# Patient Record
Sex: Male | Born: 1960
Health system: Southern US, Community
[De-identification: ages and names within clinical notes are randomized; demographics above are authoritative.]

## PROBLEM LIST (undated history)

## (undated) DIAGNOSIS — N400 Enlarged prostate without lower urinary tract symptoms: Secondary | ICD-10-CM

## (undated) DIAGNOSIS — S0081XA Abrasion of other part of head, initial encounter: Secondary | ICD-10-CM

## (undated) DIAGNOSIS — F32A Depression, unspecified: Secondary | ICD-10-CM

## (undated) DIAGNOSIS — F329 Major depressive disorder, single episode, unspecified: Secondary | ICD-10-CM

## (undated) DIAGNOSIS — S0230XA Fracture of orbital floor, unspecified side, initial encounter for closed fracture: Secondary | ICD-10-CM

## (undated) DIAGNOSIS — I10 Essential (primary) hypertension: Secondary | ICD-10-CM

## (undated) DIAGNOSIS — F419 Anxiety disorder, unspecified: Secondary | ICD-10-CM

## (undated) DIAGNOSIS — M069 Rheumatoid arthritis, unspecified: Secondary | ICD-10-CM

## (undated) DIAGNOSIS — F4310 Post-traumatic stress disorder, unspecified: Secondary | ICD-10-CM

## (undated) DIAGNOSIS — U071 COVID-19: Secondary | ICD-10-CM

## (undated) HISTORY — DX: Essential (primary) hypertension: I10

## (undated) HISTORY — PX: KNEE ARTHROSCOPY: SHX127

---

## 2003-08-11 ENCOUNTER — Emergency Department (HOSPITAL_COMMUNITY): Admission: EM | Admit: 2003-08-11 | Discharge: 2003-08-11 | Payer: Self-pay | Admitting: Emergency Medicine

## 2012-01-24 ENCOUNTER — Other Ambulatory Visit (HOSPITAL_COMMUNITY): Payer: Self-pay | Admitting: Family Medicine

## 2012-01-24 ENCOUNTER — Ambulatory Visit (HOSPITAL_COMMUNITY)
Admission: RE | Admit: 2012-01-24 | Discharge: 2012-01-24 | Disposition: A | Discharge status: Home / Self Care | Payer: Self-pay | Source: Ambulatory Visit | Attending: Family Medicine | Admitting: Family Medicine

## 2012-01-24 DIAGNOSIS — R52 Pain, unspecified: Secondary | ICD-10-CM | POA: Insufficient documentation

## 2012-01-27 ENCOUNTER — Emergency Department (HOSPITAL_COMMUNITY)
Admission: EM | Admit: 2012-01-27 | Discharge: 2012-01-27 | Disposition: A | Discharge status: Home / Self Care | Payer: Self-pay | Attending: Emergency Medicine | Admitting: Emergency Medicine

## 2012-01-27 ENCOUNTER — Encounter (HOSPITAL_COMMUNITY): Payer: Self-pay

## 2012-01-27 DIAGNOSIS — J3489 Other specified disorders of nose and nasal sinuses: Secondary | ICD-10-CM | POA: Insufficient documentation

## 2012-01-27 DIAGNOSIS — R040 Epistaxis: Secondary | ICD-10-CM | POA: Insufficient documentation

## 2012-01-27 LAB — COMPREHENSIVE METABOLIC PANEL
ALT: 16 U/L (ref 0–53)
AST: 29 U/L (ref 0–37)
Albumin: 3.9 g/dL (ref 3.5–5.2)
Alkaline Phosphatase: 87 U/L (ref 39–117)
Potassium: 3.8 mEq/L (ref 3.5–5.1)
Sodium: 137 mEq/L (ref 135–145)
Total Protein: 7.6 g/dL (ref 6.0–8.3)

## 2012-01-27 LAB — CBC
MCH: 30.6 pg (ref 26.0–34.0)
MCHC: 34.1 g/dL (ref 30.0–36.0)
Platelets: 258 10*3/uL (ref 150–400)
RBC: 4.08 MIL/uL — ABNORMAL LOW (ref 4.22–5.81)

## 2012-01-27 LAB — DIFFERENTIAL
Basophils Relative: 0 % (ref 0–1)
Eosinophils Absolute: 0.1 10*3/uL (ref 0.0–0.7)
Lymphs Abs: 3 10*3/uL (ref 0.7–4.0)
Neutrophils Relative %: 36 % — ABNORMAL LOW (ref 43–77)

## 2012-01-27 MED ORDER — OXYMETAZOLINE HCL 0.05 % NA SOLN
1.0000 | Freq: Once | NASAL | Status: AC
  Administered 2012-01-27: 1 via NASAL
  Filled 2012-01-27: qty 15

## 2012-01-27 NOTE — ED Notes (Signed)
EMS states nose bleed starting at approx 1800 today. Pt advises DX with HTN today. Has not started medication yet.

## 2012-01-27 NOTE — ED Notes (Signed)
Pt given sandwich and juice

## 2012-01-27 NOTE — ED Notes (Signed)
Pt tearful when I entered the room. Would not say what was bothering him. Pt also requesting food if able. Tim, RN aware.

## 2012-01-27 NOTE — ED Provider Notes (Signed)
Complains of nosebleed from both nares onset this afternoon lasted one hour resolved while here on exam alert no distress. Dried blood at both nares. No suspected with nasal speculum and headlamp no bleeding site visualized  Doug Sou, MD 01/27/12 2241

## 2012-01-27 NOTE — ED Notes (Signed)
[id number]> Expected date:<BR> Expected time:<BR> Means of arrival:<BR> Comments:<BR> EMS/HTN/nosebleed

## 2012-01-27 NOTE — ED Provider Notes (Signed)
History     CSN: [id number]  Arrival date & time 01/27/12  1308   First MD Initiated Contact with Patient 01/27/12 2025      Chief Complaint  Patient presents with  . Epistaxis    (Consider location/radiation/quality/duration/timing/severity/associated sxs/prior treatment) HPI Comments: Patient states he was walking and he had bilateral nosebleed that led for approximately 2 hours.  He tried stopping the bleeding by inserting tissue into his naris.  He did not apply external pressure.  He called EMS.  There is no active bleeding at this time.  He does not have a history ofis not on an anticoagulant today.  He was at a doctor's office for chronic leg pain and was told with hypertensive and prescribed an antihypertensive, which he has not filled as of yet right now in the emergency department.  He is not hypertensive, with blood pressure of 118.  He does not have headache.  He does report that he has had "flulike symptoms" for several days with nasal congestion and lots of mucus  Patient is a 51 y.o. male presenting with nosebleeds.  Epistaxis  This is a new problem. The current episode started 1 to 2 hours ago. The problem has been resolved. The bleeding has been from both nares. He has tried nothing for the symptoms. His past medical history is significant for colds and sinus problems. His past medical history does not include bleeding disorder, nose-picking or frequent nosebleeds.    Past Medical History  Diagnosis Date  . Hypertension     History reviewed. No pertinent past surgical history.  History reviewed. No pertinent family history.  History  Substance Use Topics  . Smoking status: Not on file  . Smokeless tobacco: Not on file  . Alcohol Use:       Review of Systems  Constitutional: Negative for fever and chills.  HENT: Positive for nosebleeds, congestion and rhinorrhea. Negative for sore throat and trouble swallowing.   Respiratory: Negative for cough.     Cardiovascular: Negative for chest pain.  Gastrointestinal: Negative for nausea and vomiting.  Neurological: Negative for dizziness and weakness.    Allergies  Review of patient's allergies indicates no known allergies.  Home Medications   Current Outpatient Rx  Name Route Sig Dispense Refill  . AMOXICILLIN 500 MG PO CAPS Oral Take 500 mg by mouth 3 (three) times daily. 10 day course of therapy; completed.    Marland Kitchen DICLOFENAC SODIUM 75 MG PO TBEC Oral Take 75 mg by mouth 2 (two) times daily.      BP 118/73  Pulse 91  Temp(Src) 98.3 F (36.8 C) (Oral)  Resp 20  SpO2 98%  Physical Exam  Constitutional: He is oriented to person, place, and time. He appears well-developed and well-nourished.  HENT:  Head: Normocephalic.  Nose: Rhinorrhea present. No nose lacerations, sinus tenderness, nasal deformity, septal deviation or nasal septal hematoma.       Boggy mucosa bilaterally evidence of previous epistaxis.  No active bleeding at this time  Eyes: Pupils are equal, round, and reactive to light.  Neck: Normal range of motion.  Cardiovascular: Normal rate.   Pulmonary/Chest: Effort normal. He has no wheezes.  Musculoskeletal: Normal range of motion.  Neurological: He is alert and oriented to person, place, and time.  Skin: Skin is warm and dry.    ED Course  Procedures (including critical care time)  Labs Reviewed  CBC - Abnormal; Notable for the following:    RBC 4.08 (*)  Hemoglobin 12.5 (*)    HCT 36.7 (*)    All other components within normal limits  DIFFERENTIAL - Abnormal; Notable for the following:    Neutrophils Relative 36 (*)    Lymphocytes Relative 55 (*)    All other components within normal limits  COMPREHENSIVE METABOLIC PANEL  PROTIME-INR   No results found.   1. Epistaxis     No recurrent nasal bleeding  MDM  Will check CBC CMet, and INR of as the nurse to administer Afrin bilaterally were observed.  Patient at this time.  His blood pressure is  normal.  We'll continue to monitor this throughout his ED visit        Arman Filter, NP 01/27/12 2245

## 2012-01-28 NOTE — ED Provider Notes (Signed)
Medical screening examination/treatment/procedure(s) were conducted as a shared visit with non-physician practitioner(s) and myself.  I personally evaluated the patient during the encounter  Doug Sou, MD 01/28/12 [phone]

## 2012-02-04 ENCOUNTER — Emergency Department (INDEPENDENT_AMBULATORY_CARE_PROVIDER_SITE_OTHER)
Admission: EM | Admit: 2012-02-04 | Discharge: 2012-02-04 | Disposition: A | Discharge status: Home / Self Care | Payer: Self-pay | Source: Home / Self Care | Attending: Emergency Medicine | Admitting: Emergency Medicine

## 2012-02-04 ENCOUNTER — Encounter (HOSPITAL_COMMUNITY): Payer: Self-pay

## 2012-02-04 DIAGNOSIS — J329 Chronic sinusitis, unspecified: Secondary | ICD-10-CM

## 2012-02-04 MED ORDER — GUAIFENESIN ER 600 MG PO TB12: 1200.0000 mg | ORAL_TABLET | Freq: Two times a day (BID) | ORAL | Status: DC

## 2012-02-04 MED ORDER — DOXYCYCLINE HYCLATE 100 MG PO CAPS: 100.0000 mg | ORAL_CAPSULE | Freq: Two times a day (BID) | ORAL | Status: AC

## 2012-02-04 MED ORDER — FLUTICASONE PROPIONATE 50 MCG/ACT NA SUSP: 2.0000 | Freq: Every day | NASAL | Status: DC

## 2012-02-04 NOTE — ED Provider Notes (Signed)
History     CSN: [id number]  Arrival date & time 02/04/12  1531   First MD Initiated Contact with Patient 02/04/12 1721      Chief Complaint  Patient presents with  . Influenza    (Consider location/radiation/quality/duration/timing/severity/associated sxs/prior treatment) HPI Comments: Pt with nasal congestion, postnasal drip, ST, nonproductive cough, bilateral frontal sinus pain/pressure x 2  Weeks. Complains of purulent nasal discharge, bodyaches, malaise. No fevers, N/V, other HA, dental pain, neck pain, wheezing, chest pain, shortness breath. Taking otc cold meds w/o relief.    Patient is a 51 y.o. male presenting with flu symptoms. The history is provided by the patient. No language interpreter was used.  Influenza The current episode started more than 1 week ago. The problem occurs constantly. The problem has been gradually worsening. Associated symptoms include headaches. Pertinent negatives include no chest pain and no shortness of breath. The symptoms are relieved by nothing. Treatments tried: zicam. The treatment provided no relief.    Past Medical History  Diagnosis Date  . Hypertension   . Arthritis     History reviewed. No pertinent past surgical history.  History reviewed. No pertinent family history.  History  Substance Use Topics  . Smoking status: Never Smoker   . Smokeless tobacco: Not on file  . Alcohol Use: No      Review of Systems  Constitutional: Positive for fatigue. Negative for fever and chills.  HENT: Positive for congestion, sore throat, rhinorrhea, postnasal drip and sinus pressure. Negative for hearing loss, ear pain, nosebleeds, facial swelling and neck pain.   Respiratory: Positive for cough. Negative for shortness of breath and wheezing.   Cardiovascular: Negative for chest pain.  Gastrointestinal: Negative for nausea and vomiting.  Neurological: Positive for headaches.    Allergies  Review of patient's allergies indicates no known  allergies.  Home Medications   Current Outpatient Rx  Name Route Sig Dispense Refill  . HYDROCHLOROTHIAZIDE 25 MG PO TABS Oral Take 25 mg by mouth daily.    Marland Kitchen PREDNISONE (PAK) 10 MG PO TABS Oral Take 10 mg by mouth daily.    . TRAMADOL HCL 50 MG PO TABS Oral Take 50 mg by mouth every 6 (six) hours as needed.    Marland Kitchen DICLOFENAC SODIUM 75 MG PO TBEC Oral Take 75 mg by mouth 2 (two) times daily.    Marland Kitchen DOXYCYCLINE HYCLATE 100 MG PO CAPS Oral Take 1 capsule (100 mg total) by mouth 2 (two) times daily. X 7 days 14 capsule 0  . FLUTICASONE PROPIONATE 50 MCG/ACT NA SUSP Nasal Place 2 sprays into the nose daily. 16 g 0  . GUAIFENESIN ER 600 MG PO TB12 Oral Take 2 tablets (1,200 mg total) by mouth 2 (two) times daily. 28 tablet 0    BP 131/86  Pulse 90  Temp(Src) 98.2 F (36.8 C) (Oral)  Resp 22  SpO2 97%  Physical Exam  Nursing note and vitals reviewed. Constitutional: He is oriented to person, place, and time. He appears well-developed and well-nourished.  HENT:  Head: Normocephalic and atraumatic.  Nose: Mucosal edema and rhinorrhea present. No epistaxis. Right sinus exhibits maxillary sinus tenderness and frontal sinus tenderness. Left sinus exhibits maxillary sinus tenderness and frontal sinus tenderness.  Mouth/Throat: Uvula is midline and mucous membranes are normal. Posterior oropharyngeal erythema present. No oropharyngeal exudate.       Purulent nasal discharge. Bilateral cerumen impaction  Eyes: Conjunctivae and EOM are normal. Pupils are equal, round, and reactive to light.  Neck:  Normal range of motion. Neck supple.  Cardiovascular: Normal rate, regular rhythm and normal heart sounds.   Pulmonary/Chest: Effort normal and breath sounds normal. No respiratory distress.  Abdominal: He exhibits no distension.  Musculoskeletal: Normal range of motion. He exhibits no edema and no tenderness.  Lymphadenopathy:    He has no cervical adenopathy.  Neurological: He is alert and oriented to  person, place, and time.  Skin: Skin is warm and dry. No rash noted.  Psychiatric: He has a normal mood and affect. His behavior is normal. Judgment and thought content normal.    ED Course  Procedures (including critical care time)  Labs Reviewed - No data to display No results found.   1. Sinusitis     MDM  Previous records reviewed. Patient seen on 01/27/2012 for epistaxis.   Pt with indications for abx. Will start  doxycycline in addition to flonase, mucinex, saline nasal irrigation, increase fluids, tylenol prn pain. Discussed MDM and plan with pt. Pt agrees with plan and will f/u with PMD prn.    Luiz Blare, MD 02/04/12 [phone]

## 2012-02-04 NOTE — ED Notes (Signed)
C/o cough,  Nasal congestion w thick yellow  nasal secretions, minimal relief w OTC medications

## 2012-02-28 ENCOUNTER — Ambulatory Visit (HOSPITAL_BASED_OUTPATIENT_CLINIC_OR_DEPARTMENT_OTHER): Payer: Self-pay

## 2012-03-02 ENCOUNTER — Ambulatory Visit (HOSPITAL_BASED_OUTPATIENT_CLINIC_OR_DEPARTMENT_OTHER): Payer: Self-pay | Attending: Family Medicine

## 2012-03-12 ENCOUNTER — Encounter (HOSPITAL_BASED_OUTPATIENT_CLINIC_OR_DEPARTMENT_OTHER): Payer: Self-pay

## 2012-04-11 ENCOUNTER — Ambulatory Visit: Payer: Self-pay | Admitting: Physical Therapy

## 2012-04-19 ENCOUNTER — Ambulatory Visit: Payer: Self-pay | Admitting: Rehabilitative and Restorative Service Providers"

## 2012-05-04 ENCOUNTER — Ambulatory Visit: Payer: Self-pay | Attending: Family Medicine | Admitting: Rehabilitation

## 2012-05-04 DIAGNOSIS — R262 Difficulty in walking, not elsewhere classified: Secondary | ICD-10-CM | POA: Insufficient documentation

## 2012-05-04 DIAGNOSIS — M25569 Pain in unspecified knee: Secondary | ICD-10-CM | POA: Insufficient documentation

## 2012-05-04 DIAGNOSIS — IMO0001 Reserved for inherently not codable concepts without codable children: Secondary | ICD-10-CM | POA: Insufficient documentation

## 2012-05-04 DIAGNOSIS — M25669 Stiffness of unspecified knee, not elsewhere classified: Secondary | ICD-10-CM | POA: Insufficient documentation

## 2012-05-09 ENCOUNTER — Emergency Department (HOSPITAL_COMMUNITY)
Admission: EM | Admit: 2012-05-09 | Discharge: 2012-05-09 | Disposition: A | Discharge status: Home / Self Care | Payer: Self-pay | Attending: Emergency Medicine | Admitting: Emergency Medicine

## 2012-05-09 ENCOUNTER — Encounter (HOSPITAL_COMMUNITY): Payer: Self-pay | Admitting: *Deleted

## 2012-05-09 DIAGNOSIS — R109 Unspecified abdominal pain: Secondary | ICD-10-CM | POA: Insufficient documentation

## 2012-05-09 DIAGNOSIS — R112 Nausea with vomiting, unspecified: Secondary | ICD-10-CM | POA: Insufficient documentation

## 2012-05-09 DIAGNOSIS — M129 Arthropathy, unspecified: Secondary | ICD-10-CM | POA: Insufficient documentation

## 2012-05-09 DIAGNOSIS — Z79899 Other long term (current) drug therapy: Secondary | ICD-10-CM | POA: Insufficient documentation

## 2012-05-09 DIAGNOSIS — R197 Diarrhea, unspecified: Secondary | ICD-10-CM | POA: Insufficient documentation

## 2012-05-09 DIAGNOSIS — I10 Essential (primary) hypertension: Secondary | ICD-10-CM | POA: Insufficient documentation

## 2012-05-09 DIAGNOSIS — A088 Other specified intestinal infections: Secondary | ICD-10-CM | POA: Insufficient documentation

## 2012-05-09 DIAGNOSIS — A084 Viral intestinal infection, unspecified: Secondary | ICD-10-CM

## 2012-05-09 LAB — CBC
HCT: 42.2 % (ref 39.0–52.0)
MCV: 93 fL (ref 78.0–100.0)
Platelets: 214 10*3/uL (ref 150–400)
RBC: 4.54 MIL/uL (ref 4.22–5.81)
WBC: 6.1 10*3/uL (ref 4.0–10.5)

## 2012-05-09 LAB — DIFFERENTIAL
Eosinophils Relative: 0 % (ref 0–5)
Lymphocytes Relative: 7 % — ABNORMAL LOW (ref 12–46)
Lymphs Abs: 0.4 10*3/uL — ABNORMAL LOW (ref 0.7–4.0)
Neutro Abs: 5.5 10*3/uL (ref 1.7–7.7)

## 2012-05-09 LAB — URINALYSIS, ROUTINE W REFLEX MICROSCOPIC
Glucose, UA: NEGATIVE mg/dL
Specific Gravity, Urine: 1.03 (ref 1.005–1.030)
Urobilinogen, UA: 0.2 mg/dL (ref 0.0–1.0)

## 2012-05-09 LAB — HEPATIC FUNCTION PANEL
ALT: 24 U/L (ref 0–53)
AST: 42 U/L — ABNORMAL HIGH (ref 0–37)
Alkaline Phosphatase: 74 U/L (ref 39–117)
Indirect Bilirubin: 0.8 mg/dL (ref 0.3–0.9)
Total Protein: 8.1 g/dL (ref 6.0–8.3)

## 2012-05-09 LAB — URINE MICROSCOPIC-ADD ON

## 2012-05-09 LAB — POCT I-STAT, CHEM 8
BUN: 10 mg/dL (ref 6–23)
Calcium, Ion: 1.2 mmol/L (ref 1.12–1.32)
Chloride: 106 mEq/L (ref 96–112)
Creatinine, Ser: 0.7 mg/dL (ref 0.50–1.35)
Glucose, Bld: 135 mg/dL — ABNORMAL HIGH (ref 70–99)
HCT: 46 % (ref 39.0–52.0)

## 2012-05-09 MED ORDER — ONDANSETRON HCL 4 MG/2ML IJ SOLN
4.0000 mg | Freq: Once | INTRAMUSCULAR | Status: AC
  Administered 2012-05-09: 4 mg via INTRAVENOUS
  Filled 2012-05-09: qty 2

## 2012-05-09 MED ORDER — SODIUM CHLORIDE 0.9 % IV BOLUS (SEPSIS)
1000.0000 mL | Freq: Once | INTRAVENOUS | Status: AC
  Administered 2012-05-09: 1000 mL via INTRAVENOUS

## 2012-05-09 MED ORDER — MORPHINE SULFATE 4 MG/ML IJ SOLN
4.0000 mg | Freq: Once | INTRAMUSCULAR | Status: AC
  Administered 2012-05-09: 4 mg via INTRAVENOUS
  Filled 2012-05-09: qty 1

## 2012-05-09 MED ORDER — ONDANSETRON 8 MG PO TBDP: 8.0000 mg | ORAL_TABLET | Freq: Three times a day (TID) | ORAL | Status: AC | PRN

## 2012-05-09 NOTE — ED Notes (Signed)
Pt is here with vomiting for 2 days.  Diarrhea for one week and decreased appetite.  Pt is having nausea and bad stomach ache

## 2012-05-09 NOTE — Discharge Instructions (Signed)
You may have gatorade and zofran at your place of residence.  Patient may rest in the morning rather than being discharged at 8 AM for the next 2 days.  RESOURCE GUIDE  Dental Problems  Patients with Medicaid: [facility]                     [phone]                                           Phone:  [phone]                                                  If unable to pay or uninsured, contact:  [facility] or [facility]. to become qualified for the adult dental clinic.  Chronic Pain Problems Contact [facility]  [phone] Patients need to be referred by their primary care doctor.  Insufficient Money for Medicine Contact [employer]:  call "211" or [facility] [phone])072-2435.  No Primary Care Doctor Call [facility]  [phone] Other agencies that provide inexpensive medical care    [facility]  [phone]    [facility]  [phone]    [facility]  [phone]    [facility]  [phone]    [facility]  [phone]    [facility]  [phone]  Substance Abuse Resources [facility]  [phone] [facility] [phone] [facility] [phone] Floydene Flock [phone] Residential & Outpatient Substance Abuse Program  [phone]  Psychological Services [facility]  [phone] [facility]  [phone] [facility] Mental Health   [phone] (emergency services [phone])  Abuse/Neglect [facility] Child Abuse Hotline 720-330-3841 [facility] Child Abuse Hotline ([phone])880-0913 (After Hours)  Emergency Shelter [facility] ([phone]  Maternity Homes Room at the [place] [facility] [place] [phone] [facility] [phone]  MRSA Hotline #:   2284085401    [facility] Resources  [facility]  [employer]                            [facility] Dept. [address] [place]                     [address]         [address]  [place]                                               Cristobal Goldmann Phone:  [phone]                                  Phone:  [phone]  Phone:  [phone]  [facility] Mental [facility] Phone:  [phone]  [facility] Child Abuse Hotline 321-044-7024 [phone] (After Hours) Viral Infections A viral infection can be caused by different types of viruses.Most viral infections are not serious and resolve on their own. However, some infections may cause severe symptoms and may lead to further complications. SYMPTOMS Viruses can frequently cause:  Minor sore throat.   Aches and pains.   Headaches.   Runny nose.   Different types of rashes.   Watery eyes.   Tiredness.   Cough.   Loss of appetite.   Gastrointestinal infections, resulting in nausea, vomiting, and diarrhea.  These symptoms do not respond to antibiotics because the infection is not caused by bacteria. However, you might catch a bacterial infection following the viral infection. This is sometimes called a "superinfection." Symptoms of such a bacterial infection may include:  Worsening sore throat with pus and difficulty swallowing.   Swollen neck glands.   Chills and a high or persistent fever.   Severe headache.   Tenderness over the sinuses.   Persistent overall ill feeling (malaise), muscle aches, and tiredness (fatigue).   Persistent cough.   Yellow, green, or brown mucus production with coughing.  HOME CARE INSTRUCTIONS   Only take over-the-counter or prescription medicines for pain, discomfort, diarrhea, or fever as directed by your caregiver.   Drink enough water and fluids to keep your urine clear or pale yellow. Sports drinks can provide valuable electrolytes, sugars, and hydration.   Get plenty of rest and  maintain proper nutrition. Soups and broths with crackers or rice are fine.  SEEK IMMEDIATE MEDICAL CARE IF:   You have severe headaches, shortness of breath, chest pain, neck pain, or an unusual rash.   You have uncontrolled vomiting, diarrhea, or you are unable to keep down fluids.   You or your child has an oral temperature above 102 F (38.9 C), not controlled by medicine.   Your baby is older than 3 months with a rectal temperature of 102 F (38.9 C) or higher.   Your baby is 74 months old or younger with a rectal temperature of 100.4 F (38 C) or higher.  MAKE SURE YOU:   Understand these instructions.   Will watch your condition.   Will get help right away if you are not doing well or get worse.  Document Released: 09/22/2005 Document Revised: 12/02/2011 Document Reviewed: 04/19/2011 [facility] Patient Information 2012 [place], [place].

## 2012-05-09 NOTE — ED Provider Notes (Signed)
History    Scribed for Sean Numbers, MD, the patient was seen in room STRE1/STRE1. This chart was scribed by Katha Cabal.   CSN: [id number]  Arrival date & time 05/09/12  1223   First MD Initiated Contact with Patient 05/09/12 1236      Chief Complaint  Patient presents with  . Emesis  . Diarrhea    (Consider location/radiation/quality/duration/timing/severity/associated sxs/prior treatment) HPI Sean Numbers, MD entered patient's room at 12:52 PM   Sean Mccoy is a 51 y.o. male who presents to the Emergency Department complaining of moderate persistent diffuse abdominal pain with associated diarrhea, nausea and vomiting.  He rates this 7/10. Non bloody diarrhea for over a week.  Vomiting for past 2 days.  Patient took Pepto Bismol without relief.   No recent sick contacts or foreign travel.  No recent antibiotic use.  Symptoms are not associated with fever, cough, chest pain, shortness of breath or melena.     PCP No primary provider on file.     Past Medical History  Diagnosis Date  . Hypertension   . Arthritis     History reviewed. No pertinent past surgical history.  History reviewed. No pertinent family history.  History  Substance Use Topics  . Smoking status: Never Smoker   . Smokeless tobacco: Not on file  . Alcohol Use: No      Review of Systems  Constitutional: Negative.   HENT: Negative.   Eyes: Negative.   Respiratory: Negative.  Negative for shortness of breath.   Cardiovascular: Negative.   Gastrointestinal: Positive for nausea, vomiting, abdominal pain and diarrhea.  Genitourinary: Negative.   Musculoskeletal: Negative.   Skin: Negative.   Neurological: Negative.   Hematological: Negative.   Psychiatric/Behavioral: Negative.   All other systems reviewed and are negative.   Remaining review of systems negative except as noted in the HPI.   Allergies  Review of patient's allergies indicates no known allergies.  Home Medications    Current Outpatient Rx  Name Route Sig Dispense Refill  . CYCLOBENZAPRINE HCL 10 MG PO TABS Oral Take 10 mg by mouth 3 (three) times daily as needed. For spasms    . HYDROCHLOROTHIAZIDE 25 MG PO TABS Oral Take 25 mg by mouth daily.    Marland Kitchen PRAVASTATIN SODIUM 20 MG PO TABS Oral Take 20 mg by mouth daily.    . TRAMADOL HCL 50 MG PO TABS Oral Take 50 mg by mouth every 6 (six) hours as needed. For pain    . ONDANSETRON 8 MG PO TBDP Oral Take 1 tablet (8 mg total) by mouth every 8 (eight) hours as needed for nausea. 20 tablet 0    BP 141/85  Pulse 92  Temp(Src) 98.4 F (36.9 C) (Oral)  Resp 18  SpO2 100%  Physical Exam  Nursing note and vitals reviewed. Constitutional: He is oriented to person, place, and time. He appears well-developed and well-nourished. No distress.  HENT:  Head: Normocephalic and atraumatic.  Eyes: Conjunctivae and EOM are normal. Pupils are equal, round, and reactive to light.  Neck: Neck supple. No tracheal deviation present.  Cardiovascular: Normal rate, regular rhythm, normal heart sounds and intact distal pulses.  Exam reveals no gallop and no friction rub.   No murmur heard. Pulmonary/Chest: Effort normal and breath sounds normal. No respiratory distress.  Abdominal: Soft. Bowel sounds are normal. He exhibits no distension. There is tenderness. There is no rebound and no guarding.       Mild diffuse tenderness to palpation,  positive bowel sounds,   Musculoskeletal: Normal range of motion.  Neurological: He is alert and oriented to person, place, and time. Cranial nerve deficit: crainial nerves 2-12 grossly intact   Skin: Skin is warm and dry.  Psychiatric: He has a normal mood and affect. His behavior is normal.    ED Course  Procedures (including critical care time)   DIAGNOSTIC STUDIES: Oxygen Saturation is 98% on room air, normal by my interpretation.     COORDINATION OF CARE: 12:59 PM  Physical exam complete.  Discussed plan of treatment with  patient.  Patient agrees with plan.  Zofran, morphine and IV fluids ordered.      Orders Placed This Encounter  Procedures  . Urinalysis, Routine w reflex microscopic  . CBC  . Differential  . Hepatic function panel  . Lipase, blood  . Urine microscopic-add on  . I-STAT, chem 8  . Insert peripheral IV    LABS / RADIOLOGY:   Labs Reviewed  URINALYSIS, ROUTINE W REFLEX MICROSCOPIC - Abnormal; Notable for the following:    Color, Urine AMBER (*) BIOCHEMICALS MAY BE AFFECTED BY COLOR   Hgb urine dipstick TRACE (*)    Bilirubin Urine SMALL (*)    Ketones, ur 15 (*)    All other components within normal limits  DIFFERENTIAL - Abnormal; Notable for the following:    Neutrophils Relative 89 (*)    Lymphocytes Relative 7 (*)    Lymphs Abs 0.4 (*)    All other components within normal limits  HEPATIC FUNCTION PANEL - Abnormal; Notable for the following:    AST 42 (*)    All other components within normal limits  POCT I-STAT, CHEM 8 - Abnormal; Notable for the following:    Glucose, Bld 135 (*)    All other components within normal limits  CBC  LIPASE, BLOOD  URINE MICROSCOPIC-ADD ON   No results found.       MDM  Patient was evaluated by myself. Based on presentation patient had labs to rule out hepatic, pancreatic, or other infectious causes of the patient's abdominal pain including urinary tract infection. Labs returned within normal limits. He was treated symptomatically. All labs returned within normal limits. Patient was felt to most likely have a viral process. Patient was told a viral gastroenteritis can last from 7-10 days. He was told that he can use Imodium for his diarrheal symptoms as needed. He is given a prescription for Zofran and resource guide followup with her primary care physician. Patient was given a note so that he could rest for a longer period of time at the [employer] as well as taken his Gatorade and Zofran. Patient was discharged in good  condition.        MEDICATIONS GIVEN IN THE E.D. Scheduled Meds:    .  morphine injection  4 mg Intravenous Once  . ondansetron  4 mg Intravenous Once  . sodium chloride  1,000 mL Intravenous Once   Continuous Infusions:      IMPRESSION: 1. Viral gastroenteritis      NEW MEDICATIONS: New Prescriptions   ONDANSETRON (ZOFRAN ODT) 8 MG DISINTEGRATING TABLET    Take 1 tablet (8 mg total) by mouth every 8 (eight) hours as needed for nausea.      I personally performed the services described in this documentation, which was scribed in my presence. The recorded information has been reviewed and considered.        Sean Numbers, MD 05/09/12 [phone]

## 2012-05-16 ENCOUNTER — Encounter: Payer: Self-pay | Admitting: Physical Therapy

## 2012-05-18 ENCOUNTER — Encounter: Payer: Self-pay | Admitting: Rehabilitation

## 2012-05-23 ENCOUNTER — Encounter: Payer: Self-pay | Admitting: Physical Therapy

## 2012-05-25 ENCOUNTER — Encounter: Payer: Self-pay | Admitting: Rehabilitation

## 2012-06-14 ENCOUNTER — Emergency Department (HOSPITAL_COMMUNITY)
Admission: EM | Admit: 2012-06-14 | Discharge: 2012-06-15 | Disposition: A | Discharge status: Home / Self Care | Payer: Self-pay | Attending: Emergency Medicine | Admitting: Emergency Medicine

## 2012-06-14 ENCOUNTER — Encounter (HOSPITAL_COMMUNITY): Payer: Self-pay | Admitting: Emergency Medicine

## 2012-06-14 DIAGNOSIS — L255 Unspecified contact dermatitis due to plants, except food: Secondary | ICD-10-CM | POA: Insufficient documentation

## 2012-06-14 DIAGNOSIS — L259 Unspecified contact dermatitis, unspecified cause: Secondary | ICD-10-CM

## 2012-06-14 DIAGNOSIS — M129 Arthropathy, unspecified: Secondary | ICD-10-CM | POA: Insufficient documentation

## 2012-06-14 DIAGNOSIS — I10 Essential (primary) hypertension: Secondary | ICD-10-CM | POA: Insufficient documentation

## 2012-06-14 MED ORDER — TRIAMCINOLONE ACETONIDE 40 MG/ML IJ SUSP
60.0000 mg | Freq: Once | INTRAMUSCULAR | Status: AC
  Administered 2012-06-15: 60 mg via INTRAMUSCULAR
  Filled 2012-06-14: qty 1.5

## 2012-06-14 NOTE — ED Notes (Signed)
PT. REPORTS GENERALIZED ITCHING/ RASH/ HIVES FOR 3 DAYS , STATES " [place] ' AFTER WORKING AT [employer] UNRELIEVED BY OTC MEDICATIONS / CORTIZONE CREAM . RESPIRATIONS UNLABORED/ AIRWAY INTACT.

## 2012-06-15 MED ORDER — DOMEBORO 25 % EX PACK: 1.0000 | PACK | Freq: Three times a day (TID) | CUTANEOUS | Status: DC

## 2012-06-15 MED ORDER — TRAMADOL HCL 50 MG PO TABS: 50.0000 mg | ORAL_TABLET | Freq: Four times a day (QID) | ORAL | Status: AC | PRN

## 2012-06-15 NOTE — ED Provider Notes (Signed)
History     CSN: [id number]  Arrival date & time 06/14/12  2005   First MD Initiated Contact with Patient 06/14/12 2358      Chief Complaint  Patient presents with  . 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  That was on his hands when he went to wake sweats, or bugs from his head and face.  He inadvertently touched his head and face.  Now has rash to his ears and scalp.  Patient is a 51 y.o. male presenting with allergic reaction. The history is provided by the patient.  Allergic Reaction The primary symptoms are  rash. The primary symptoms do not include nausea or dizziness.    Past Medical History  Diagnosis Date  . Hypertension   . Arthritis     History reviewed. No pertinent past surgical history.  No family history on file.  History  Substance Use Topics  . Smoking status: Never Smoker   . Smokeless tobacco: Not on file  . Alcohol Use: No      Review of Systems  Constitutional: Negative for fever.  Gastrointestinal: Negative for nausea.  Musculoskeletal: Negative for joint swelling.  Skin: Positive for rash. Negative for wound.  Neurological: Negative for dizziness and numbness.    Allergies  Review of patient's allergies indicates no known allergies.  Home Medications   Current Outpatient Rx  Name Route Sig Dispense Refill  . DOMEBORO 25 % EX PACK Apply externally Apply 1 packet topically 3 (three) times daily. 14 each 0  . CYCLOBENZAPRINE HCL 10 MG PO TABS Oral Take 10 mg by mouth 3 (three) times daily as needed. For spasms    . HYDROCHLOROTHIAZIDE 25 MG PO TABS Oral Take 25 mg by mouth daily.    Marland Kitchen PRAVASTATIN SODIUM 20 MG PO TABS Oral Take 20 mg by mouth daily.    . TRAMADOL HCL 50 MG PO TABS Oral Take 50 mg by mouth every 6 (six) hours as needed. For pain      BP 128/79  Pulse 98   Temp 97 F (36.1 C) (Oral)  Resp 20  SpO2 97%  Physical Exam  Constitutional: He appears well-developed and well-nourished.  HENT:       Poison ivy lesions to the scalp, back, of the ears pinna of the ears face, and upper arms, as well as chest  Eyes: Pupils are equal, round, and reactive to light.  Neck: Normal range of motion.  Cardiovascular: Normal rate.   Pulmonary/Chest: Effort normal.  Abdominal: Soft.  Genitourinary: Penis normal.  Musculoskeletal: Normal range of motion.  Neurological: He is alert.  Skin: Skin is warm.  Psychiatric: He has a normal mood and affect.    ED Course  Procedures (including critical care time)  Labs Reviewed - No data to display No results found.   1. Contact dermatitis       MDM   poison ivy        Arman Filter, NP 06/15/12 0011  Arman Filter, NP 06/15/12 0012  Arman Filter, NP 06/15/12 [phone]

## 2012-06-15 NOTE — Discharge Instructions (Signed)
Contact Dermatitis  Contact dermatitis is a rash that happens when something touches the skin. You touched something that irritates your skin, or you have allergies to something you touched.  HOME CARE    Avoid the thing that caused your rash.   Keep your rash away from hot water, soap, sunlight, chemicals, and other things that might bother it.   Do not scratch your rash.   You can take cool baths to help stop itching.   Only take medicine as told by your doctor.   Keep all doctor visits as told.  GET HELP RIGHT AWAY IF:    Your rash is not better after 3 days.   Your rash gets worse.   Your rash is puffy (swollen), tender, red, sore, or warm.   You have problems with your medicine.  MAKE SURE YOU:    Understand these instructions.   Will watch your condition.   Will get help right away if you are not doing well or get worse.  Document Released: 10/10/2009 Document Revised: 12/02/2011 Document Reviewed: 05/18/2011  ExitCare Patient Information 2012 [employer].

## 2012-06-15 NOTE — ED Notes (Signed)
51 y/o male with hx  of exposure to poison ivy this week. He states that he was wiping this went away from his bowel and around his hard hat and now has a rash in the same area. It is itchy, persistent, has used topical medications without any improvement.  On physical exam he has a linear papular erythematous pruritic rash in areas described of his bitemporal area, for head and around the nape of his neck. This is also located on his shoulders. Otherwise conjunctiva are clear, speech is normal, no respiratory distress.  Assessment: Patient has contact dermatitis from poison ivy, will discharge with prescriptions.    Medical screening examination/treatment/procedure(s) were conducted as a shared visit with non-physician practitioner(s) and myself.  I personally evaluated the patient during the encounter    Vida Roller, MD 06/15/12 [phone]

## 2012-06-15 NOTE — ED Provider Notes (Signed)
Medical screening examination/treatment/procedure(s) were performed by non-physician practitioner and as supervising physician I was immediately available for consultation/collaboration.   Denzell Colasanti, MD 06/15/12 0600 

## 2012-08-14 ENCOUNTER — Emergency Department (HOSPITAL_COMMUNITY): Payer: Self-pay

## 2012-08-14 ENCOUNTER — Encounter (HOSPITAL_COMMUNITY): Payer: Self-pay | Admitting: *Deleted

## 2012-08-14 ENCOUNTER — Emergency Department (HOSPITAL_COMMUNITY)
Admission: EM | Admit: 2012-08-14 | Discharge: 2012-08-14 | Disposition: A | Discharge status: Home / Self Care | Payer: Self-pay | Attending: Emergency Medicine | Admitting: Emergency Medicine

## 2012-08-14 DIAGNOSIS — M5126 Other intervertebral disc displacement, lumbar region: Secondary | ICD-10-CM | POA: Insufficient documentation

## 2012-08-14 DIAGNOSIS — K5289 Other specified noninfective gastroenteritis and colitis: Secondary | ICD-10-CM | POA: Insufficient documentation

## 2012-08-14 DIAGNOSIS — I10 Essential (primary) hypertension: Secondary | ICD-10-CM | POA: Insufficient documentation

## 2012-08-14 DIAGNOSIS — K529 Noninfective gastroenteritis and colitis, unspecified: Secondary | ICD-10-CM

## 2012-08-14 DIAGNOSIS — Z8739 Personal history of other diseases of the musculoskeletal system and connective tissue: Secondary | ICD-10-CM | POA: Insufficient documentation

## 2012-08-14 LAB — URINALYSIS, ROUTINE W REFLEX MICROSCOPIC
Glucose, UA: NEGATIVE mg/dL
Hgb urine dipstick: NEGATIVE
Ketones, ur: 15 mg/dL — AB
Leukocytes, UA: NEGATIVE
Nitrite: NEGATIVE
Protein, ur: 30 mg/dL — AB
Specific Gravity, Urine: 1.029 (ref 1.005–1.030)
Urobilinogen, UA: 0.2 mg/dL (ref 0.0–1.0)
pH: 5.5 (ref 5.0–8.0)

## 2012-08-14 LAB — CBC WITH DIFFERENTIAL/PLATELET
Basophils Absolute: 0 10*3/uL (ref 0.0–0.1)
Basophils Relative: 0 % (ref 0–1)
Eosinophils Absolute: 0 K/uL (ref 0.0–0.7)
Eosinophils Relative: 0 % (ref 0–5)
HCT: 40.6 % (ref 39.0–52.0)
Hemoglobin: 14.1 g/dL (ref 13.0–17.0)
Lymphocytes Relative: 31 % (ref 12–46)
Lymphs Abs: 1.8 K/uL (ref 0.7–4.0)
MCH: 31.8 pg (ref 26.0–34.0)
MCHC: 34.7 g/dL (ref 30.0–36.0)
MCV: 91.4 fL (ref 78.0–100.0)
Monocytes Absolute: 0.7 10*3/uL (ref 0.1–1.0)
Monocytes Relative: 13 % — ABNORMAL HIGH (ref 3–12)
Neutro Abs: 3.2 10*3/uL (ref 1.7–7.7)
Neutrophils Relative %: 56 % (ref 43–77)
Platelets: 257 K/uL (ref 150–400)
RBC: 4.44 MIL/uL (ref 4.22–5.81)
RDW: 15.1 % (ref 11.5–15.5)
WBC: 5.8 10*3/uL (ref 4.0–10.5)

## 2012-08-14 LAB — URINE MICROSCOPIC-ADD ON

## 2012-08-14 LAB — COMPREHENSIVE METABOLIC PANEL WITH GFR
AST: 131 U/L — ABNORMAL HIGH (ref 0–37)
BUN: 10 mg/dL (ref 6–23)
CO2: 22 meq/L (ref 19–32)
Chloride: 92 meq/L — ABNORMAL LOW (ref 96–112)
Creatinine, Ser: 0.82 mg/dL (ref 0.50–1.35)
GFR calc Af Amer: >90 mL/min (ref >90)
GFR calc non Af Amer: >90 mL/min (ref >90)
Glucose, Bld: 112 mg/dL — ABNORMAL HIGH (ref 70–99)
Total Bilirubin: 1.1 mg/dL (ref 0.3–1.2)

## 2012-08-14 LAB — COMPREHENSIVE METABOLIC PANEL
ALT: 43 U/L (ref 0–53)
Albumin: 4.3 g/dL (ref 3.5–5.2)
Alkaline Phosphatase: 81 U/L (ref 39–117)
Calcium: 9.9 mg/dL (ref 8.4–10.5)
Potassium: 4.3 mEq/L (ref 3.5–5.1)
Sodium: 132 mEq/L — ABNORMAL LOW (ref 135–145)
Total Protein: 8.2 g/dL (ref 6.0–8.3)

## 2012-08-14 MED ORDER — IBUPROFEN 800 MG PO TABS: 800.0000 mg | ORAL_TABLET | Freq: Three times a day (TID) | ORAL | Status: AC | PRN

## 2012-08-14 MED ORDER — SODIUM CHLORIDE 0.9 % IV SOLN: INTRAVENOUS | Status: DC

## 2012-08-14 MED ORDER — IBUPROFEN 400 MG PO TABS
400.0000 mg | ORAL_TABLET | Freq: Once | ORAL | Status: AC
  Administered 2012-08-14: 400 mg via ORAL
  Filled 2012-08-14: qty 1

## 2012-08-14 MED ORDER — OXYCODONE-ACETAMINOPHEN 5-325 MG PO TABS
1.0000 | ORAL_TABLET | Freq: Once | ORAL | Status: AC
  Administered 2012-08-14: 1 via ORAL
  Filled 2012-08-14: qty 1

## 2012-08-14 MED ORDER — ONDANSETRON HCL 4 MG/2ML IJ SOLN: 4.0000 mg | Freq: Once | INTRAMUSCULAR | Status: DC

## 2012-08-14 NOTE — ED Provider Notes (Signed)
I saw and evaluated the patient, reviewed the resident's note and I agree with the findings and plan.   Pt with soft abd, no N/V or diarrhea in several hours in the ED.  Minimal bump in AST.  Pt with radicular symptoms of low back into right leg after bending.  No loss of reflexes, will get MRI for possibly disc herniation.  Otherwise IVF's and symptoms management, no need for abd imaging in my opinion.  No symptoms for cauda equina.     6:55 PM  MRI shows disc protrusion and impingement, will refer to [facility].  Gavin Pound. Oletta Lamas, MD 08/14/12 [id number]

## 2012-08-14 NOTE — ED Notes (Signed)
Pt reports vomiting too

## 2012-08-14 NOTE — ED Notes (Signed)
Pt here with right leg pain and now with right posterior thigh burning from below buttock to foot and reported a lot lifting and bending.  Pulse present.  Then one week later foot going numb.  Pt reports something is wrong with stomach and cannot eat in the last 3 days.  Loss of energy and appetite.  Pt reports diarrhea that is water.

## 2012-08-14 NOTE — ED Provider Notes (Signed)
History     CSN: [id number]  Arrival date & time 08/14/12  1020   First MD Initiated Contact with Patient 08/14/12 1056      Chief Complaint  Patient presents with  . Leg Pain    right    (Consider location/radiation/quality/duration/timing/severity/associated sxs/prior treatment) HPI:  This is a 51 year old man with arthritis and hypertension, presenting with right thigh pain and foot numbness.  Onset was 2 weeks ago.  He was at work, bending down and lifting, and suddenly felt a burning pain in his posterior, right thigh.  That same day he noticed some mild numbness in his right foot.  No trauma reported.  Pain is described as burning.  Pain does not radiate above hip or below knee.  Severity at onset was 5/10.  It is now 9.5/10.  The pain and numbness have been constantly, but only in the past few days have they increased in intensity/severity.  Sitting makes the pain worse.  Nothing changes the numbness.  Nothing makes the pain better.  Nothing has been taken for the pain.  He has a history of a lower back injury years ago.  It still gives him trouble occasionally.  For the past week, his lower back has been hurting him more than usual.  He has not worked since the incident two weeks ago, as he will be starting school at [employer] soon.  The pain is a dull ache, midline over the lumbar spine.  Movement of the lumbar spine makes it worse.  Pain does not radiate down into the legs.  Additionally, patient has been having N/V/D, fatigue, and abdominal pain for 4 days.  He has completely lost his appetite.  No hematochezia, hematemesis, dysuria, fevers, or chills.  He has had a 10 pound weight loss over the past month.  No night sweats.   Past Medical History  Diagnosis Date  . Hypertension   . Arthritis     History reviewed. No pertinent past surgical history.  No family history on file.  History  Substance Use Topics  . Smoking status: Never Smoker   . Smokeless tobacco: Not on file    . Alcohol Use: No      Review of Systems  All other systems reviewed and are negative.    Allergies  Review of patient's allergies indicates no known allergies.  Home Medications  No current outpatient prescriptions on file.  BP 144/91  Pulse 92  Temp 98.2 F (36.8 C) (Oral)  Resp 16  SpO2 100%  Physical Exam  Constitutional: He is oriented to person, place, and time. Vital signs are normal. He appears well-developed and well-nourished. He is cooperative. He does not appear ill. No distress.  HENT:  Head: Normocephalic and atraumatic.  Mouth/Throat: Uvula is midline, oropharynx is clear and moist and mucous membranes are normal.  Eyes: Conjunctivae and EOM are normal. Pupils are equal, round, and reactive to light.  Neck: Normal range of motion and full passive range of motion without pain. Neck supple.  Cardiovascular: Normal rate, regular rhythm, normal heart sounds, intact distal pulses and normal pulses.   No murmur heard. Pulmonary/Chest: Effort normal and breath sounds normal.  Abdominal: Soft. Normal appearance. He exhibits no mass. Bowel sounds are increased. There is no hepatosplenomegaly. There is generalized tenderness. There is no rigidity, no rebound, no guarding and negative Murphy's sign.  Musculoskeletal: Normal range of motion.       Lumbar back: He exhibits tenderness and bony tenderness. He  exhibits normal range of motion, no swelling, no deformity and no spasm.       Right upper leg: He exhibits tenderness. He exhibits no swelling.  Lymphadenopathy:    He has no cervical adenopathy.  Neurological: He is alert and oriented to person, place, and time. A sensory deficit (decreased sensation in left foot) is present. No cranial nerve deficit. GCS eye subscore is 4. GCS verbal subscore is 5. GCS motor subscore is 6.  Reflex Scores:      Patellar reflexes are 3+ on the right side and 3+ on the left side.      Strength 4/5 on the right:  Dorsiflexion,  plantarflexion, knee extension, knee flexion, hip extension, hip flexion. Strength 5/5 everywhere else. Patient reports partially limited by pain, but also weakness.  Skin: Skin is warm, dry and intact.    ED Course  Procedures (including critical care time)  5:25 PM - Ibuprofen 400mg  and Percocet 5/325 given for pain.  6:49 PM - Pt reassessed.  Pain improved.  Spoke with Dr. Lovell Sheehan of NeuroSurg.  Okay with conservative treatment and outpatient follow up.  Labs Reviewed  URINALYSIS, ROUTINE W REFLEX MICROSCOPIC - Abnormal; Notable for the following:    Color, Urine AMBER (*)  BIOCHEMICALS MAY BE AFFECTED BY COLOR   APPearance CLOUDY (*)     Bilirubin Urine SMALL (*)     Ketones, ur 15 (*)     Protein, ur 30 (*)     All other components within normal limits  COMPREHENSIVE METABOLIC PANEL - Abnormal; Notable for the following:    Sodium 132 (*)     Chloride 92 (*)     Glucose, Bld 112 (*)     AST 131 (*)     All other components within normal limits  CBC WITH DIFFERENTIAL - Abnormal; Notable for the following:    Monocytes Relative 13 (*)     All other components within normal limits  URINE MICROSCOPIC-ADD ON - Abnormal; Notable for the following:    Bacteria, UA FEW (*)     Casts HYALINE CASTS (*)     All other components within normal limits   Mr Lumbar Spine Wo Contrast  08/14/2012  *RADIOLOGY REPORT*  Clinical Data: Low back pain radiating into right leg.  Right leg numbness.  MRI LUMBAR SPINE WITHOUT CONTRAST  Technique:  Multiplanar and multiecho pulse sequences of the lumbar spine were obtained without intravenous contrast.  Comparison: None.  Findings: The lowest full intervertebral disk space is labeled L5- S1.  If procedural intervention is to be performed, careful correlation with this numbering strategy is recommended.  The conus medullaris appears unremarkable.  Conus level: L1.  Loss of intervertebral disc height noted at L5-S1.  There is disc desiccation at L4-5 and  L5-S1.  Type two degenerative endplate findings are present L5-S1.  Inversion recovery weighted images demonstrate no significant abnormal vertebral or periligamentous edema.  Additional findings at individual levels are as follows:  T11-12:  No impingement.  Mild disc bulge.  T12-L1:  Unremarkable.  L1-2:  No impingement.  Mild facet arthropathy.  L2-3:  Unremarkable.  L3-4:  No overt impingement.  Minimal disc bulge.  L4-5: Moderate to prominent right subarticular lateral recess stenosis due to right eccentric disc bulge with disc protrusion or disc fragment extending caudad in the right lateral recess.  There is mild left subarticular lateral recess stenosis and borderline central stenosis.  L5-S1:  Mild bilateral foraminal stenosis due to facet arthropathy and  intervertebral disc osteophyte complex.  Small central disc protrusion is also present.  IMPRESSION: 1.  Dominant impingement is at L4-5, where there is prominent right subarticular lateral recess stenosis due to right lateral recess disc protrusion or disc fragment.  There also mild left subarticular lateral recess stenosis at this level.  2.  Mild bilateral foraminal stenosis at L5-S1 primarily due to spurring.   Original Report Authenticated By: Dellia Cloud, M.D.      1. Herniated nucleus pulposus, L4-5   2. Gastroenteritis       MDM  Pain in leg could be radicular pain arising from lumbar disk disease demonstrated on MRI.  This could also be a primary strain of the posterior leg musculature.  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  There is no indication (history, exam, or radiographically) of myelopathy or cauda equina syndrome.  Dr. Lovell Sheehan of neurosurgery was consulted and he agreed with conservative management and outpatient follow up.  Pt discharged with rx for ibuprofen 800mg  and instructions to supplement with Tylenol as  needed.  Pt will call Dr. Lovell Sheehan' office tomorrow morning.  Symptoms and exam consistent with a viral gastroenteritis.  No concerning signs for bacterial infection.  Time course not consistent with food poisoning.    Lollie Sails, MD 08/14/12 [phone]

## 2012-11-27 ENCOUNTER — Encounter (HOSPITAL_COMMUNITY): Payer: Self-pay | Admitting: *Deleted

## 2012-11-27 ENCOUNTER — Emergency Department (HOSPITAL_COMMUNITY)
Admission: EM | Admit: 2012-11-27 | Discharge: 2012-11-28 | Disposition: A | Discharge status: Other Acute Inpatient Hospital | Payer: Self-pay | Attending: Emergency Medicine | Admitting: Emergency Medicine

## 2012-11-27 DIAGNOSIS — Z8739 Personal history of other diseases of the musculoskeletal system and connective tissue: Secondary | ICD-10-CM | POA: Insufficient documentation

## 2012-11-27 DIAGNOSIS — I10 Essential (primary) hypertension: Secondary | ICD-10-CM | POA: Insufficient documentation

## 2012-11-27 DIAGNOSIS — M25569 Pain in unspecified knee: Secondary | ICD-10-CM | POA: Insufficient documentation

## 2012-11-27 DIAGNOSIS — G8929 Other chronic pain: Secondary | ICD-10-CM | POA: Insufficient documentation

## 2012-11-27 DIAGNOSIS — R45851 Suicidal ideations: Secondary | ICD-10-CM | POA: Insufficient documentation

## 2012-11-27 DIAGNOSIS — F329 Major depressive disorder, single episode, unspecified: Secondary | ICD-10-CM | POA: Insufficient documentation

## 2012-11-27 DIAGNOSIS — M549 Dorsalgia, unspecified: Secondary | ICD-10-CM | POA: Insufficient documentation

## 2012-11-27 DIAGNOSIS — F1010 Alcohol abuse, uncomplicated: Secondary | ICD-10-CM | POA: Insufficient documentation

## 2012-11-27 DIAGNOSIS — F3289 Other specified depressive episodes: Secondary | ICD-10-CM | POA: Insufficient documentation

## 2012-11-27 LAB — COMPREHENSIVE METABOLIC PANEL
ALT: 51 U/L (ref 0–53)
Albumin: 4.1 g/dL (ref 3.5–5.2)
Alkaline Phosphatase: 74 U/L (ref 39–117)
BUN: 9 mg/dL (ref 6–23)
Calcium: 9 mg/dL (ref 8.4–10.5)
Potassium: 4.1 mEq/L (ref 3.5–5.1)
Sodium: 136 mEq/L (ref 135–145)
Total Protein: 7.7 g/dL (ref 6.0–8.3)

## 2012-11-27 LAB — CBC WITH DIFFERENTIAL/PLATELET
Basophils Relative: 1 % (ref 0–1)
Eosinophils Absolute: 0 10*3/uL (ref 0.0–0.7)
Eosinophils Relative: 1 % (ref 0–5)
MCH: 31.6 pg (ref 26.0–34.0)
MCHC: 34.1 g/dL (ref 30.0–36.0)
Neutrophils Relative %: 34 % — ABNORMAL LOW (ref 43–77)
Platelets: 197 10*3/uL (ref 150–400)

## 2012-11-27 LAB — RAPID URINE DRUG SCREEN, HOSP PERFORMED
Amphetamines: NOT DETECTED
Benzodiazepines: NOT DETECTED
Opiates: NOT DETECTED
Tetrahydrocannabinol: NOT DETECTED

## 2012-11-27 MED ORDER — ONDANSETRON 4 MG PO TBDP
4.0000 mg | ORAL_TABLET | Freq: Once | ORAL | Status: AC
  Administered 2012-11-27: 4 mg via ORAL
  Filled 2012-11-27: qty 1

## 2012-11-27 MED ORDER — KETOROLAC TROMETHAMINE 30 MG/ML IJ SOLN
30.0000 mg | Freq: Once | INTRAMUSCULAR | Status: AC
  Administered 2012-11-27: 30 mg via INTRAMUSCULAR
  Filled 2012-11-27: qty 1

## 2012-11-27 MED ORDER — HYDROMORPHONE HCL PF 1 MG/ML IJ SOLN
1.0000 mg | INTRAMUSCULAR | Status: AC
  Administered 2012-11-27: 1 mg via INTRAMUSCULAR
  Filled 2012-11-27: qty 1

## 2012-11-27 NOTE — ED Notes (Signed)
Pt states he was here in August and was told that pain in knee and numbness and tingling in foot could be associated with back problems, pt was told to go the neurologist. Pt states that he has not been to see neurologist due to insurance problems. Pt mentating appropriately. Pt states currently feeling dizzy; pt states feeling nauseous; pt states loss of appetite; pt states "suffering from insomnia."

## 2012-11-27 NOTE — ED Notes (Signed)
Security notified and is at bedside to wand patient.

## 2012-11-27 NOTE — ED Notes (Signed)
Pt in c/o back and knee pain, pain is chronic, seen here for same in the past. Pt states he is out of his pain medication and pain is increased.

## 2012-11-27 NOTE — ED Notes (Signed)
Patient changing out of his clothes and into blue paper scrubs

## 2012-11-27 NOTE — ED Notes (Signed)
Dr. Rulon Abide states pt stated that pt was c/o SI upon assessment earlier. Care hand off given to Sam, RN and informed that pt denied SI earlier and that pt told Dr. Rulon Abide he was suicidal.

## 2012-11-27 NOTE — ED Notes (Signed)
[facility] team at bedside with pt.

## 2012-11-27 NOTE — ED Notes (Signed)
Sitter at bedside.

## 2012-11-27 NOTE — ED Provider Notes (Signed)
History   This chart was scribed for Jones Skene, MD by Gerlean Ren, ED Scribe. This patient was seen in room TR10C/TR10C and the patient's care was started at 8:41 PM    CSN: [id number]  Arrival date & time 11/27/12  1859   First MD Initiated Contact with Patient 11/27/12 1959      Chief Complaint  Patient presents with  . Back Pain  . Knee Pain     The history is provided by the patient. No language interpreter was used.   Sean Mccoy is a 51 y.o. male with h/o chronic back pain, herniated lumbar disc, and chronic right knee pain who presents to the Emergency Department complaining of constant, sharp, moderate-to-severe lower back pain radiating down posterior right thigh and right knee pain with associated right foot numbness and tingling.  Pain and numbness are similar in type to chronic pain and tingling but are gradually worsening.  Pt further complains of spells of dizziness and light-headedness not associated with pain that have been gradually worsening over the past 5-6 months that he attributes to medicine he has been taking for PTSD and depression.  Pt admits to having occasional suicidal thoughts, decreased energy level, difficulty getting out of bed, decreased appetite, and annhedonia.  Pt further reports recent problems with diarrhea and dyspnea but denies chest pain.  Pt states Percocet helps with pain but causes nausea and emesis.     Past Medical History  Diagnosis Date  . Hypertension   . Arthritis     History reviewed. No pertinent past surgical history.  History reviewed. No pertinent family history.  History  Substance Use Topics  . Smoking status: Never Smoker   . Smokeless tobacco: Not on file  . Alcohol Use: No      Review of Systems At least 10pt or greater review of systems completed and are negative except where specified in the HPI.  Allergies  Review of patient's allergies indicates no known allergies.  Home Medications  No current  outpatient prescriptions on file.  BP 133/90  Pulse 105  Temp 98 F (36.7 C) (Oral)  Resp 20  SpO2 100%  Physical Exam  Nursing notes reviewed.  Electronic medical record reviewed. VITAL SIGNS:   Filed Vitals:   11/27/12 1902  BP: 133/90  Pulse: 105  Temp: 98 F (36.7 C)  TempSrc: Oral  Resp: 20  SpO2: 100%   CONSTITUTIONAL: Awake, oriented, appears non-toxic, tearful HENT: Atraumatic, normocephalic, oral mucosa pink and moist, airway patent. Nares patent without drainage. External ears normal. EYES: Conjunctiva clear, EOMI, PERRLA NECK: Trachea midline, non-tender, supple CARDIOVASCULAR: Normal heart rate, Normal rhythm, No murmurs, rubs, gallops PULMONARY/CHEST: Clear to auscultation, no rhonchi, wheezes, or rales. Symmetrical breath sounds. Non-tender. ABDOMINAL: Non-distended, soft, non-tender - no rebound or guarding.  BS normal. NEUROLOGIC: Non-focal, moving all four extremities, no gross sensory or motor deficits. Patellar reflexes 2+, no clonus.  Numbness is only on the plantar surface of right toe webbing. BACK: Mild TTP at L5 S1 left sided PS  EXTREMITIES: No clubbing, cyanosis, or edema SKIN: Warm, Dry, No erythema, No rash  ED Course  Procedures (including critical care time) DIAGNOSTIC STUDIES: Oxygen Saturation is 100% on room air, normal by my interpretation.    COORDINATION OF CARE: 8:54 PM- Patient informed of clinical course, understands medical decision-making process, and agrees with plan.    Labs Reviewed - No data to display Results for orders placed during the hospital encounter of 11/27/12  CBC WITH  DIFFERENTIAL      Component Value Range   WBC 4.0  4.0 - 10.5 K/uL   RBC 3.92 (*) 4.22 - 5.81 MIL/uL   Hemoglobin 12.4 (*) 13.0 - 17.0 g/dL   HCT 16.1 (*) 09.6 - 04.5 %   MCV 92.9  78.0 - 100.0 fL   MCH 31.6  26.0 - 34.0 pg   MCHC 34.1  30.0 - 36.0 g/dL   RDW 40.9  81.1 - 91.4 %   Platelets 197  150 - 400 K/uL   Neutrophils Relative 34 (*) 43  - 77 %   Neutro Abs 1.3 (*) 1.7 - 7.7 K/uL   Lymphocytes Relative 52 (*) 12 - 46 %   Lymphs Abs 2.0  0.7 - 4.0 K/uL   Monocytes Relative 13 (*) 3 - 12 %   Monocytes Absolute 0.5  0.1 - 1.0 K/uL   Eosinophils Relative 1  0 - 5 %   Eosinophils Absolute 0.0  0.0 - 0.7 K/uL   Basophils Relative 1  0 - 1 %   Basophils Absolute 0.0  0.0 - 0.1 K/uL  COMPREHENSIVE METABOLIC PANEL      Component Value Range   Sodium 136  135 - 145 mEq/L   Potassium 4.1  3.5 - 5.1 mEq/L   Chloride 98  96 - 112 mEq/L   CO2 21  19 - 32 mEq/L   Glucose, Bld 100 (*) 70 - 99 mg/dL   BUN 9  6 - 23 mg/dL   Creatinine, Ser 7.82  0.50 - 1.35 mg/dL   Calcium 9.0  8.4 - 95.6 mg/dL   Total Protein 7.7  6.0 - 8.3 g/dL   Albumin 4.1  3.5 - 5.2 g/dL   AST 213 (*) 0 - 37 U/L   ALT 51  0 - 53 U/L   Alkaline Phosphatase 74  39 - 117 U/L   Total Bilirubin 0.6  0.3 - 1.2 mg/dL   GFR calc non Af Amer >90  >90 mL/min   GFR calc Af Amer >90  >90 mL/min  URINE RAPID DRUG SCREEN (HOSP PERFORMED)      Component Value Range   Opiates NONE DETECTED  NONE DETECTED   Cocaine NONE DETECTED  NONE DETECTED   Benzodiazepines NONE DETECTED  NONE DETECTED   Amphetamines NONE DETECTED  NONE DETECTED   Tetrahydrocannabinol NONE DETECTED  NONE DETECTED   Barbiturates NONE DETECTED  NONE DETECTED  ETHANOL      Component Value Range   Alcohol, Ethyl (B) 251 (*) 0 - 11 mg/dL    No results found.   1. Suicidal thoughts   2. Depression   3. Chronic back pain   4. Chronic knee pain   5. Alcohol abuse       MDM  Sean Mccoy is a 51 y.o. male presenting with acute on chronic back and knee pain - no change in neurologic exam.  Mild worsening per pt of tingling at base of right toe webs - which doesn't correlate with a neurotome.  I do not think he has a neurologic emergency such as acute cord compression 2/2 to bulging disc, and I don't think he needs further imaging at this time.   Pt does have h/o depression and has been taking his  paxil intermittently possibly raising hhis risk of suicidal thoughts and depression.  I think he is clearly depressed and is expressing suicidal thoughts without concrete plan.  Will have [facility] evaluate.  Pt may require IP  treatment.  Treating pain symptomatically.    I personally performed the services described in this documentation, which was scribed in my presence. The recorded information has been reviewed and is accurate. Jones Skene, M.D.           Jones Skene, MD 11/28/12 [phone]

## 2012-11-27 NOTE — ED Notes (Signed)
Report given to [place], Charity fundraiser. Patient transported to POD C #24. Ambulatory with no assistance.

## 2012-11-27 NOTE — ED Notes (Addendum)
Pt denies shortness of breath; denies dizziness; Pt states "I am a little faint." Pt denies chest pain. Pt denies nausea currently. Pt mentating appropriately. Pt denies SI/HI.

## 2012-11-28 ENCOUNTER — Inpatient Hospital Stay (HOSPITAL_COMMUNITY)
Admission: AD | Admit: 2012-11-28 | Discharge: 2012-11-30 | DRG: 885 | Disposition: A | Discharge status: Home / Self Care | Payer: No Typology Code available for payment source | Source: Ambulatory Visit | Attending: Psychiatry | Admitting: Psychiatry

## 2012-11-28 ENCOUNTER — Encounter (HOSPITAL_COMMUNITY): Payer: Self-pay | Admitting: *Deleted

## 2012-11-28 DIAGNOSIS — F1010 Alcohol abuse, uncomplicated: Secondary | ICD-10-CM | POA: Diagnosis present

## 2012-11-28 DIAGNOSIS — M129 Arthropathy, unspecified: Secondary | ICD-10-CM | POA: Diagnosis present

## 2012-11-28 DIAGNOSIS — F4310 Post-traumatic stress disorder, unspecified: Secondary | ICD-10-CM | POA: Diagnosis present

## 2012-11-28 DIAGNOSIS — F411 Generalized anxiety disorder: Secondary | ICD-10-CM | POA: Diagnosis present

## 2012-11-28 DIAGNOSIS — F1994 Other psychoactive substance use, unspecified with psychoactive substance-induced mood disorder: Secondary | ICD-10-CM | POA: Diagnosis present

## 2012-11-28 DIAGNOSIS — I10 Essential (primary) hypertension: Secondary | ICD-10-CM | POA: Diagnosis present

## 2012-11-28 DIAGNOSIS — F32A Depression, unspecified: Secondary | ICD-10-CM | POA: Diagnosis present

## 2012-11-28 DIAGNOSIS — F329 Major depressive disorder, single episode, unspecified: Secondary | ICD-10-CM | POA: Diagnosis present

## 2012-11-28 DIAGNOSIS — Z79899 Other long term (current) drug therapy: Secondary | ICD-10-CM

## 2012-11-28 DIAGNOSIS — F419 Anxiety disorder, unspecified: Secondary | ICD-10-CM | POA: Diagnosis present

## 2012-11-28 DIAGNOSIS — F331 Major depressive disorder, recurrent, moderate: Principal | ICD-10-CM | POA: Diagnosis present

## 2012-11-28 MED ORDER — ONDANSETRON 4 MG PO TBDP: 4.0000 mg | ORAL_TABLET | Freq: Four times a day (QID) | ORAL | Status: DC | PRN

## 2012-11-28 MED ORDER — DICLOFENAC SODIUM 75 MG PO TBEC
75.0000 mg | DELAYED_RELEASE_TABLET | Freq: Two times a day (BID) | ORAL | Status: DC | PRN
  Administered 2012-11-28: 75 mg via ORAL
  Filled 2012-11-28: qty 1

## 2012-11-28 MED ORDER — PAROXETINE HCL 10 MG PO TABS
10.0000 mg | ORAL_TABLET | Freq: Every day | ORAL | Status: DC
  Administered 2012-11-28: 10 mg via ORAL
  Filled 2012-11-28 (×2): qty 1

## 2012-11-28 MED ORDER — TRAZODONE HCL 50 MG PO TABS
50.0000 mg | ORAL_TABLET | Freq: Every evening | ORAL | Status: DC | PRN
  Administered 2012-11-28 – 2012-11-29 (×3): 50 mg via ORAL
  Filled 2012-11-28 (×2): qty 1
  Filled 2012-11-28: qty 14
  Filled 2012-11-28: qty 1

## 2012-11-28 MED ORDER — ALUM & MAG HYDROXIDE-SIMETH 200-200-20 MG/5ML PO SUSP: 30.0000 mL | ORAL | Status: DC | PRN

## 2012-11-28 MED ORDER — PREDNISONE (PAK) 5 MG PO TABS: 1.0000 mg | ORAL_TABLET | Freq: Every day | ORAL | Status: DC | PRN

## 2012-11-28 MED ORDER — SIMVASTATIN 10 MG PO TABS
10.0000 mg | ORAL_TABLET | Freq: Every day | ORAL | Status: DC
  Filled 2012-11-28: qty 1

## 2012-11-28 MED ORDER — IBUPROFEN 200 MG PO TABS
400.0000 mg | ORAL_TABLET | ORAL | Status: DC | PRN
  Administered 2012-11-28 – 2012-11-29 (×2): 400 mg via ORAL
  Filled 2012-11-28 (×2): qty 2

## 2012-11-28 MED ORDER — CHLORDIAZEPOXIDE HCL 25 MG PO CAPS: 25.0000 mg | ORAL_CAPSULE | Freq: Every day | ORAL | Status: DC

## 2012-11-28 MED ORDER — PREDNISONE 1 MG PO TABS
1.0000 mg | ORAL_TABLET | Freq: Every day | ORAL | Status: DC | PRN
  Filled 2012-11-28: qty 1

## 2012-11-28 MED ORDER — DICLOFENAC SODIUM 75 MG PO TBEC: 75.0000 mg | DELAYED_RELEASE_TABLET | Freq: Every day | ORAL | Status: DC | PRN

## 2012-11-28 MED ORDER — VITAMIN B-1 100 MG PO TABS
100.0000 mg | ORAL_TABLET | Freq: Every day | ORAL | Status: DC
  Administered 2012-11-29 – 2012-11-30 (×2): 100 mg via ORAL
  Filled 2012-11-28 (×3): qty 1

## 2012-11-28 MED ORDER — HYDROXYZINE HCL 25 MG PO TABS
25.0000 mg | ORAL_TABLET | Freq: Four times a day (QID) | ORAL | Status: DC | PRN
  Administered 2012-11-28 – 2012-11-29 (×2): 25 mg via ORAL
  Filled 2012-11-28: qty 1

## 2012-11-28 MED ORDER — THIAMINE HCL 100 MG/ML IJ SOLN: 100.0000 mg | Freq: Once | INTRAMUSCULAR | Status: DC

## 2012-11-28 MED ORDER — ONDANSETRON 4 MG PO TBDP: 8.0000 mg | ORAL_TABLET | Freq: Three times a day (TID) | ORAL | Status: DC | PRN

## 2012-11-28 MED ORDER — ADULT MULTIVITAMIN W/MINERALS CH
1.0000 | ORAL_TABLET | Freq: Every day | ORAL | Status: DC
  Administered 2012-11-28 – 2012-11-30 (×3): 1 via ORAL
  Filled 2012-11-28 (×4): qty 1

## 2012-11-28 MED ORDER — HYDROCHLOROTHIAZIDE 25 MG PO TABS
25.0000 mg | ORAL_TABLET | Freq: Every morning | ORAL | Status: DC
  Administered 2012-11-28: 25 mg via ORAL
  Filled 2012-11-28: qty 1

## 2012-11-28 MED ORDER — LOPERAMIDE HCL 2 MG PO CAPS: 2.0000 mg | ORAL_CAPSULE | ORAL | Status: DC | PRN

## 2012-11-28 MED ORDER — AMITRIPTYLINE HCL 25 MG PO TABS: 50.0000 mg | ORAL_TABLET | Freq: Every evening | ORAL | Status: DC | PRN

## 2012-11-28 MED ORDER — CYCLOBENZAPRINE HCL 10 MG PO TABS
10.0000 mg | ORAL_TABLET | Freq: Three times a day (TID) | ORAL | Status: DC | PRN
  Administered 2012-11-28: 10 mg via ORAL
  Filled 2012-11-28: qty 1

## 2012-11-28 MED ORDER — OXYCODONE-ACETAMINOPHEN 5-325 MG PO TABS: 1.0000 | ORAL_TABLET | Freq: Four times a day (QID) | ORAL | Status: DC | PRN

## 2012-11-28 MED ORDER — CHLORDIAZEPOXIDE HCL 25 MG PO CAPS
25.0000 mg | ORAL_CAPSULE | Freq: Once | ORAL | Status: AC
  Administered 2012-11-28: 25 mg via ORAL
  Filled 2012-11-28: qty 1

## 2012-11-28 MED ORDER — CHLORDIAZEPOXIDE HCL 25 MG PO CAPS: 25.0000 mg | ORAL_CAPSULE | Freq: Four times a day (QID) | ORAL | Status: DC | PRN

## 2012-11-28 MED ORDER — CHLORDIAZEPOXIDE HCL 25 MG PO CAPS: 25.0000 mg | ORAL_CAPSULE | Freq: Three times a day (TID) | ORAL | Status: DC

## 2012-11-28 MED ORDER — CHLORDIAZEPOXIDE HCL 25 MG PO CAPS: 25.0000 mg | ORAL_CAPSULE | ORAL | Status: DC

## 2012-11-28 MED ORDER — CYCLOBENZAPRINE HCL 10 MG PO TABS: 10.0000 mg | ORAL_TABLET | Freq: Two times a day (BID) | ORAL | Status: DC | PRN

## 2012-11-28 MED ORDER — CHLORDIAZEPOXIDE HCL 25 MG PO CAPS
25.0000 mg | ORAL_CAPSULE | Freq: Four times a day (QID) | ORAL | Status: DC
  Administered 2012-11-29: 25 mg via ORAL
  Filled 2012-11-28: qty 1

## 2012-11-28 MED ORDER — MAGNESIUM HYDROXIDE 400 MG/5ML PO SUSP: 30.0000 mL | Freq: Every day | ORAL | Status: DC | PRN

## 2012-11-28 MED ORDER — ACETAMINOPHEN 325 MG PO TABS: 650.0000 mg | ORAL_TABLET | Freq: Four times a day (QID) | ORAL | Status: DC | PRN

## 2012-11-28 NOTE — ED Notes (Addendum)
First meeting with patient. Patient sitting up on side of bed eating breakfast with NAD. Sitter at bedside.

## 2012-11-28 NOTE — BH Assessment (Signed)
Assessment Note  Update:  Received call from [facility] stating pt accepted by Donell Sievert, PA to Dr. Daleen Bo to bed 302-1 and that pt could be transported to [facility].  Updated EDP Linker and ED staff.  Completed support paperwork, assessment notification and updated assessment disposition.  Faxed to [facility] to log.  ED staff to arrange transport to [facility] via security, as pt is voluntary.     Disposition:  Disposition Disposition of Patient: Inpatient treatment program Type of inpatient treatment program: Adult Patient referred to: Other (Comment) (Pt accepted [facility])  On Site Evaluation by:   Reviewed with Physician:  Lora Paula 11/28/2012 3:47 PM

## 2012-11-28 NOTE — Tx Team (Signed)
Initial Interdisciplinary Treatment Plan  PATIENT STRENGTHS: (choose at least two) Ability for insight Average or above average intelligence General fund of knowledge Physical Health Religious Affiliation Special hobby/interest  PATIENT STRESSORS: Educational concerns Financial difficulties Health problems Substance abuse   PROBLEM LIST: Problem List/Patient Goals Date to be addressed Date deferred Reason deferred Estimated date of resolution  depression 11/28/12     Substance abuse 11/28/12                                                DISCHARGE CRITERIA:  Ability to meet basic life and health needs Improved stabilization in mood, thinking, and/or behavior Medical problems require only outpatient monitoring Motivation to continue treatment in a less acute level of care Need for constant or close observation no longer present Verbal commitment to aftercare and medication compliance Withdrawal symptoms are absent or subacute and managed without 24-hour nursing intervention  PRELIMINARY DISCHARGE PLAN: Outpatient therapy Return to previous living arrangement Return to previous work or school arrangements  PATIENT/FAMIILY INVOLVEMENT: This treatment plan has been presented to and reviewed with the patient, Sean Mccoy, and/or family member, The patient and family have been given the opportunity to ask questions and make suggestions.  Beatrix Shipper 11/28/2012, 7:02 PM
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
## 2012-11-28 NOTE — ED Notes (Signed)
Assumed patient care. Patient currently resting comfortably. No needs assessed at this time. Sitter at bedside. Will continue to monitor.

## 2012-11-28 NOTE — ED Notes (Signed)
Patient belongings inventoried and secured in ED locker 9 & 10. Patient's medications inventoried and secured with pharmacy.

## 2012-11-28 NOTE — Progress Notes (Signed)
Pt admitted voluntary with depression, PTSD, HTN and arthritis. Pt denies si on admission and states that he has been depressed with increased anxiety, related to school [employer]), no job and worrying that his money will run out and he will become homeless. He is currently receiving money for school and is renting a room. He reports no car and uses public transportation. Pt reports that he wants to receive disability on admission and he is requesting help. Pt's affect is sad, tearful and anxious. He reports that he drank more than usual yesterday.

## 2012-11-28 NOTE — ED Provider Notes (Signed)
Pt resting comfortably in pod C this morning, sitter at bedside.  Pt has no complaints.  He has been accepted at [facility], and is awaiting an available bed.    Ethelda Chick, MD 11/28/12 [phone]

## 2012-11-29 ENCOUNTER — Encounter (HOSPITAL_COMMUNITY): Payer: Self-pay | Admitting: Psychiatry

## 2012-11-29 DIAGNOSIS — F4310 Post-traumatic stress disorder, unspecified: Secondary | ICD-10-CM | POA: Diagnosis present

## 2012-11-29 DIAGNOSIS — F419 Anxiety disorder, unspecified: Secondary | ICD-10-CM | POA: Diagnosis present

## 2012-11-29 DIAGNOSIS — F329 Major depressive disorder, single episode, unspecified: Secondary | ICD-10-CM | POA: Diagnosis present

## 2012-11-29 DIAGNOSIS — F1010 Alcohol abuse, uncomplicated: Secondary | ICD-10-CM | POA: Diagnosis present

## 2012-11-29 DIAGNOSIS — F1994 Other psychoactive substance use, unspecified with psychoactive substance-induced mood disorder: Secondary | ICD-10-CM | POA: Diagnosis present

## 2012-11-29 MED ORDER — DULOXETINE HCL 20 MG PO CPEP
20.0000 mg | ORAL_CAPSULE | Freq: Every day | ORAL | Status: DC
  Administered 2012-11-29 – 2012-11-30 (×2): 20 mg via ORAL
  Filled 2012-11-29 (×3): qty 1

## 2012-11-29 MED ORDER — SIMVASTATIN 20 MG PO TABS
20.0000 mg | ORAL_TABLET | Freq: Every day | ORAL | Status: DC
  Administered 2012-11-29: 20 mg via ORAL
  Filled 2012-11-29 (×2): qty 1

## 2012-11-29 MED ORDER — CYCLOBENZAPRINE HCL 10 MG PO TABS: 10.0000 mg | ORAL_TABLET | Freq: Two times a day (BID) | ORAL | Status: DC | PRN

## 2012-11-29 MED ORDER — HYDROCHLOROTHIAZIDE 25 MG PO TABS
25.0000 mg | ORAL_TABLET | Freq: Every day | ORAL | Status: DC
  Administered 2012-11-29 – 2012-11-30 (×2): 25 mg via ORAL
  Filled 2012-11-29 (×4): qty 1

## 2012-11-29 MED ORDER — NABUMETONE 750 MG PO TABS
750.0000 mg | ORAL_TABLET | Freq: Two times a day (BID) | ORAL | Status: DC
  Administered 2012-11-29 – 2012-11-30 (×3): 750 mg via ORAL
  Filled 2012-11-29 (×5): qty 1

## 2012-11-29 NOTE — Progress Notes (Signed)
D: Pt in bed resting with eyes closed. Respirations even and unlabored. Pt appears to be in no signs of distress at this time. A: Q15min checks remains for this pt. R: Pt remains safe at this time.   

## 2012-11-29 NOTE — Progress Notes (Signed)
Patient attended 11 am psycho educational group: This group consisted of the therapeutic ball that contain short and long term goals for treatment and recovery. Patients were challenged to find the good in every situation and not to dwell on the negative decisions that they have made.  

## 2012-11-29 NOTE — Progress Notes (Signed)
Patient ID: Sean Mccoy, male   DOB: 12-16-1961, 51 y.o.   MRN: [id number] He has been up and to groups today, interacting with peers and staff. Self inventory: depression 8  Hopelessness 0, denies withdrawal symptoms and SI.  Indicated that he wants to get  Into [employer] [place] [facility].
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
## 2012-11-29 NOTE — Progress Notes (Signed)
Pt observed in his bed awake.  Pt reports he is having minimal withdrawal symptoms at this time.  He did not attend NA evening group this evening.  He says he just feels tired.  He is frustrated that he is missing classes at [employer] and asks about getting a letter to take to the school about his hospitalization.  Informed pt that he would need to speak with his case manager about his concerns.  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   He is hoping to be discharged tomorrow.  Pt also says he is concerned that he is not getting enough sleep.  He has a sleep med ordered with a repeat dose if needed.  This was explained to the patient.  Pt voiced understanding.  Pt makes his needs known to staff.  Support/encouragement given.  Safety maintained with q15 minute checks.

## 2012-11-29 NOTE — [facility] Suicide Risk Assessment (Signed)
Suicide Risk Assessment  Admission Assessment     Nursing information obtained from:  Patient Demographic factors:  Male;Low socioeconomic status;Living alone;Unemployed Current Mental Status:   (denies si) Loss Factors:  Financial problems / change in socioeconomic status Historical Factors:  NA Risk Reduction Factors:  Positive therapeutic relationship  CLINICAL FACTORS:   Depression:   Anhedonia Comorbid alcohol abuse/dependence Impulsivity  COGNITIVE FEATURES THAT CONTRIBUTE TO RISK:  Thought constriction (tunnel vision)    SUICIDE RISK:   Minimal: No identifiable suicidal ideation.  Patients presenting with no risk factors but with morbid ruminations; may be classified as minimal risk based on the severity of the depressive symptoms  PLAN OF CARE: Increase antidepressant dosage. Encourage patient to attend groups and participate.   Sean Mccoy 11/29/2012, 1:12 PM

## 2012-11-29 NOTE — Clinical Social Work Note (Signed)
[facility] LCSW Aftercare Discharge Planning Group Note  11/29/2012 8:45 AM  Participation Quality:  Appropriate, Attentive and Resistant  Affect:  Anxious, Appropriate, Anxious  Cognitive:  Alert and Appropriate  Insight:  Developing/Improving  Engagement in Group:  Engaged  Modes of Intervention:  Clarification, Discussion, Education, Exploration, Orientation, Problem-solving, Rapport Building, Socialization and Support  Summary of Progress/Problems: Pt attended discharge planning group and actively participated in group.  CSW provided pt with today's workbook.  Pt presents with anxious mood and affect.  Pt rates depression and anxiety at a 6 today.  Pt denies SI/HI.  Pt states that he came to the ED for help with pain and he was sent here.  Pt states that he doesn't need to be here and wants to be d/c asap, as he is missing school.  Pt states that he lives in [place] and can return home.  Pt has access to transportation.  Pt states that he has been to [facility] [place] for medication management and therapy before and can follow up there.  CSW will secure pt's follow up.  No further needs voiced by pt at this time.    Reyes Ivan, LCSWA 11/29/2012 10:07 AM

## 2012-11-29 NOTE — Clinical Social Work Note (Signed)
[facility] Adult Inpatient Family/Significant Other Suicide Prevention Education  Suicide Prevention Education:   Patient Refusal for Family/Significant Other Suicide Prevention Education: The patient has refused to provide written consent for family/significant other to be provided Family/Significant Other Suicide Prevention Education during admission and/or prior to discharge.  Physician notified.  CSW provided suicide prevention information with patient.    The suicide prevention education provided includes the following:  Suicide risk factors  Suicide prevention and interventions  National Suicide Hotline telephone number  [facility] assessment telephone number  [facility] Emergency Assistance 911  [facility] and/or Residential Mobile Crisis Unit telephone number   Reyes Ivan, [place] 11/29/2012 3:15 PM

## 2012-11-29 NOTE — Progress Notes (Signed)
Nutrition Brief Note  Patient identified on the Malnutrition Screening Tool (MST) Report  Body mass index is 22.08 kg/(m^2). Pt meets criteria for WNL based on current BMI.   Current diet order is Regular, patient is consuming approximately 90% of meals at this time. Labs and medications reviewed.   Pt eating dinner at time of visit. Notes appetite has improved. No nutrition interventions warranted at this time. If nutrition issues arise, please consult RD.   Loyce Dys, MS RD LDN Clinical Inpatient Dietitian Pager: [phone] Weekend/After hours pager: [phone]

## 2012-11-29 NOTE — H&P (Signed)
Psychiatric Admission Assessment Adult  Patient Identification:  Sean Mccoy Date of Evaluation:  11/29/2012 Chief Complaint:  MDD SINGLE EPISODE   PTSD History of Present Illness:: Depression with suicidal and homicidal ideations, alcohol abuse, PTSD, anxiety Mood Symptoms:  Appetite, Depression, Hopelessness, Sadness, Sleep, Depression Symptoms:  depressed mood, anxiety, (Hypo) Manic Symptoms:  None Anxiety Symptoms:  Excessive Worry, Psychotic Symptoms:  None  PTSD Symptoms: Re-experiencing:  Intrusive Thoughts  Past Psychiatric History: Diagnosis:  Depression  Hospitalizations:  None  Outpatient Care:  Therapist  Substance Abuse Care:  None  Self-Mutilation:  None  Suicidal Attempts:  Denies  Violent Behaviors:  Denies   Past Medical History:   Past Medical History  Diagnosis Date  . Hypertension   . Arthritis    None. Allergies:  No Known Allergies PTA Medications: Prescriptions prior to admission  Medication Sig Dispense Refill  . hydrochlorothiazide (HYDRODIURIL) 25 MG tablet Take 25 mg by mouth every morning.      Marland Kitchen PARoxetine (PAXIL) 10 MG tablet Take 10 mg by mouth daily.      Marland Kitchen amitriptyline (ELAVIL) 50 MG tablet Take 50-100 mg by mouth at bedtime as needed. For insomnia      . cyclobenzaprine (FLEXERIL) 10 MG tablet Take 10 mg by mouth 2 (two) times daily as needed. For cramps      . diclofenac (VOLTAREN) 75 MG EC tablet Take 75 mg by mouth daily as needed. For pain      . ondansetron (ZOFRAN-ODT) 8 MG disintegrating tablet Take 8 mg by mouth every 8 (eight) hours as needed. For nausea      . pravastatin (PRAVACHOL) 20 MG tablet Take 20 mg by mouth every other day.      . predniSONE (STERAPRED UNI-PAK) 5 MG TABS Take 1 mg by mouth daily as needed. For inflammation      . traMADol (ULTRAM) 50 MG tablet Take 50 mg by mouth every 6 (six) hours as needed. For pain        Previous Psychotropic Medications:  None  Medication/Dose                  Substance Abuse History in the last 12 months: Substance Age of 1st Use Last Use Amount Specific Type  Nicotine       Alcohol      2 days ago  3-4 drinks  usually 1-2 beers a week  Cannabis      Opiates      Cocaine      Methamphetamines      LSD      Ecstasy      Benzodiazepines      Caffeine      Inhalants      Others:                         Consequences of Substance Abuse:  None  Social History: Current Place of Residence:  [place], [place] Place of Birth:  [place], [place] Family Members:  Parents deceased, 3 brothers and 2 sisters--supportive Marital Status:  Single Children:  Sons: 1  Daughters: Relationships:  Girlfriend Education:  Corporate treasurer Problems/Performance:  2 years of college, in school at this time Religious Beliefs/Practices:  None History of Abuse (Emotional/Phsycial/Sexual):  Denies Occupational Experiences; Adult nurse History:  Data processing manager History:  Insignificant Hobbies/Interests:  Sports  Family History:  History reviewed. No pertinent family history.  Mental Status Examination/Evaluation: Objective:  Appearance: Casual  Eye Contact::  Fair  Speech:  Normal Rate  Volume:  Normal  Mood:  Depressed, anxious  Affect:  Depressed  Thought Process:  Goal Directed  Orientation:  Full  Thought Content:  WDL  Suicidal Thoughts:  No  Homicidal Thoughts:  No  Memory:  Immediate;   Fair Recent;   Fair Remote;   Fair  Judgement:  Fair  Insight:  Fair  Psychomotor Activity:  Normal  Concentration:  Fair  Recall:  Fair  Akathisia:  No  Handed:  Right  AIMS (if indicated):     Assets:  Desire for Improvement Social Support  Sleep:  Number of Hours: 6     Laboratory/X-Ray Psychological Evaluation(s)   Completed and reviewed, stable    Assessment:    AXIS I:  Alcohol Abuse, Anxiety Disorder NOS, Depressive Disorder NOS and Substance Induced Mood Disorder AXIS II:  Deferred AXIS III:   Past Medical History  Diagnosis Date   . Hypertension   . Arthritis    AXIS IV:  economic problems, housing problems, other psychosocial or environmental problems, problems related to social environment and problems with primary support group AXIS V:  41-50 serious symptoms  Treatment Plan/Recommendations:  Medication management to assist pain, depression, and anxiety, coping skills, individual and group therapy, monitoring continues.  Treatment Plan Summary: Daily contact with patient to assess and evaluate symptoms and progress in treatment Medication management Current Medications:  Current Facility-Administered Medications  Medication Dose Route Frequency Provider Last Rate Last Dose  . acetaminophen (TYLENOL) tablet 650 mg  650 mg Oral Q6H PRN Rachael Fee, MD      . alum & mag hydroxide-simeth (MAALOX/MYLANTA) 200-200-20 MG/5ML suspension 30 mL  30 mL Oral Q4H PRN Rachael Fee, MD      . chlordiazePOXIDE (LIBRIUM) capsule 25 mg  25 mg Oral Q6H PRN Rachael Fee, MD      . [address] chlordiazePOXIDE (LIBRIUM) capsule 25 mg  25 mg Oral Once Rachael Fee, MD   25 mg at 11/28/12 1833  . cyclobenzaprine (FLEXERIL) tablet 10 mg  10 mg Oral BID PRN Nanine Means, NP      . hydrochlorothiazide (HYDRODIURIL) tablet 25 mg  25 mg Oral Daily Nanine Means, NP      . hydrOXYzine (ATARAX/VISTARIL) tablet 25 mg  25 mg Oral Q6H PRN Rachael Fee, MD   25 mg at 11/29/12 [phone]  . ibuprofen (ADVIL,MOTRIN) tablet 400 mg  400 mg Oral Q4H PRN Rachael Fee, MD   400 mg at 11/29/12 0646  . loperamide (IMODIUM) capsule 2-4 mg  2-4 mg Oral PRN Rachael Fee, MD      . magnesium hydroxide (MILK OF MAGNESIA) suspension 30 mL  30 mL Oral Daily PRN Rachael Fee, MD      . multivitamin with minerals tablet 1 tablet  1 tablet Oral Daily Rachael Fee, MD   1 tablet at 11/29/12 0846  . nabumetone (RELAFEN) tablet 750 mg  750 mg Oral BID Nanine Means, NP      . ondansetron (ZOFRAN-ODT) disintegrating tablet 4 mg  4 mg Oral Q6H PRN Rachael Fee, MD       . simvastatin (ZOCOR) tablet 20 mg  20 mg Oral q1800 Nanine Means, NP      . thiamine (B-1) injection 100 mg  100 mg Intramuscular Once Rachael Fee, MD      . thiamine (VITAMIN B-1) tablet 100 mg  100 mg Oral Daily Rachael Fee, MD  100 mg at 11/29/12 0846  . traZODone (DESYREL) tablet 50 mg  50 mg Oral QHS PRN Rachael Fee, MD   50 mg at 11/28/12 2137  . [DISCONTINUED] chlordiazePOXIDE (LIBRIUM) capsule 25 mg  25 mg Oral QID Rachael Fee, MD   25 mg at 11/29/12 0846  . [DISCONTINUED] chlordiazePOXIDE (LIBRIUM) capsule 25 mg  25 mg Oral TID Rachael Fee, MD      . [DISCONTINUED] chlordiazePOXIDE (LIBRIUM) capsule 25 mg  25 mg Oral BH-qamhs Rachael Fee, MD      . [DISCONTINUED] chlordiazePOXIDE (LIBRIUM) capsule 25 mg  25 mg Oral Daily Rachael Fee, MD       Facility-Administered Medications Ordered in Other Encounters  Medication Dose Route Frequency Provider Last Rate Last Dose  . [DISCONTINUED] amitriptyline (ELAVIL) tablet 50-100 mg  50-100 mg Oral QHS PRN Ethelda Chick, MD      . [DISCONTINUED] cyclobenzaprine (FLEXERIL) tablet 10 mg  10 mg Oral TID PRN Jones Skene, MD   10 mg at 11/28/12 0056  . [DISCONTINUED] cyclobenzaprine (FLEXERIL) tablet 10 mg  10 mg Oral BID PRN Ethelda Chick, MD      . [DISCONTINUED] diclofenac (VOLTAREN) EC tablet 75 mg  75 mg Oral BID PRN John-Adam Bonk, MD   75 mg at 11/28/12 0056  . [DISCONTINUED] diclofenac (VOLTAREN) EC tablet 75 mg  75 mg Oral Daily PRN Ethelda Chick, MD      . [DISCONTINUED] hydrochlorothiazide (HYDRODIURIL) tablet 25 mg  25 mg Oral q morning - 10a Ethelda Chick, MD   25 mg at 11/28/12 1112  . [DISCONTINUED] ondansetron (ZOFRAN-ODT) disintegrating tablet 8 mg  8 mg Oral Q8H PRN Ethelda Chick, MD      . [DISCONTINUED] oxyCODONE-acetaminophen (PERCOCET/ROXICET) 5-325 MG per tablet 1-2 tablet  1-2 tablet Oral Q6H PRN John-Adam Bonk, MD      . [DISCONTINUED] PARoxetine (PAXIL) tablet 10 mg  10 mg Oral Daily Ethelda Chick, MD   10 mg at 11/28/12 1113  . [DISCONTINUED] predniSONE (DELTASONE) tablet 1 mg  1 mg Oral Daily PRN Ethelda Chick, MD      . [DISCONTINUED] predniSONE (STERAPRED UNI-PAK) tablet 5 mg  5 mg Oral Daily PRN Ethelda Chick, MD      . [DISCONTINUED] simvastatin (ZOCOR) tablet 10 mg  10 mg Oral q1800 Ethelda Chick, MD        Observation Level/Precautions:  15 minute routine checks  Laboratory:  Completed and reviewed, stable  Psychotherapy:  Individual and group  Medications:  See orders and PTA  Routine PRN Medications:  Yes  Consultations:  None  Discharge Concerns:  None  Other:     LORD, JAMISON, PMH-NP 12/4/20139:25 AM  Patient seen and assessed. Agree with above assessment and recommendations. Increase Paxil to 20mg  to target mood symptoms.
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
## 2012-11-30 ENCOUNTER — Encounter (HOSPITAL_COMMUNITY): Payer: Self-pay | Admitting: Psychiatry

## 2012-11-30 DIAGNOSIS — F411 Generalized anxiety disorder: Secondary | ICD-10-CM

## 2012-11-30 DIAGNOSIS — F331 Major depressive disorder, recurrent, moderate: Principal | ICD-10-CM

## 2012-11-30 DIAGNOSIS — F1010 Alcohol abuse, uncomplicated: Secondary | ICD-10-CM

## 2012-11-30 MED ORDER — NABUMETONE 750 MG PO TABS: 750.0000 mg | ORAL_TABLET | Freq: Two times a day (BID) | ORAL | Status: DC

## 2012-11-30 MED ORDER — HYDROCHLOROTHIAZIDE 25 MG PO TABS: 25.0000 mg | ORAL_TABLET | Freq: Every morning | ORAL | Status: DC

## 2012-11-30 MED ORDER — PRAVASTATIN SODIUM 20 MG PO TABS: 20.0000 mg | ORAL_TABLET | ORAL | Status: DC

## 2012-11-30 MED ORDER — TRAZODONE HCL 50 MG PO TABS: 50.0000 mg | ORAL_TABLET | Freq: Every evening | ORAL | Status: DC | PRN

## 2012-11-30 MED ORDER — DULOXETINE HCL 20 MG PO CPEP: 20.0000 mg | ORAL_CAPSULE | Freq: Every day | ORAL | Status: DC

## 2012-11-30 NOTE — Clinical Social Work Note (Signed)
[facility] LCSW Aftercare Discharge Planning Group Note  11/30/2012 8:45 AM   Participation Quality: Appropriate, Attentive, Sharing and Supportive   Affect: Appropriate   Cognitive: Alert and Appropriate   Insight: Engaged   Engagement in Group: Engaged   Modes of Intervention: Clarification, Discussion, Education, Exploration, Orientation, Problem-solving, Rapport Building, Socialization and Support   Summary of Progress/Problems: Pt attended discharge planning group and actively participated in group. CSW provided pt with today's workbook. Pt presents with calm mood and affect.  Pt rates depression and anxiety at a 3 today.  Pt denies SI/HI.  Pt is anxious to d/c so he can get back to school.  Pt will follow up with [facility] the [place].  Pt is requesting a letter for school and to present to the [place] for benefits (see chart).  No further needs voiced by pt at this time.    Sean Mccoy, LCSWA  11/30/2012 9:14 AM

## 2012-11-30 NOTE — Progress Notes (Signed)
Patient ID: Sean Mccoy, male   DOB: 08/17/1961, 51 y.o.   MRN: [id number] He has been up and to groups interacting with peers and staff. Self inventory: Depressed 6, Hopelessness 0, Denies SI thoughts and withdrawal symptoms. Denies SI.  Stated that he wanted to leave today.

## 2012-11-30 NOTE — Progress Notes (Signed)
[facility] Adult Case Management Discharge Plan :  Will you be returning to the same living situation after discharge: Yes,  returning home At discharge, do you have transportation home?:Yes,  provided a bus pass Do you have the ability to pay for your medications:Yes,  access to meds  Release of information consent forms completed and in the chart;  Patient's signature needed at discharge.  Patient to Follow up at: Follow-up Information    Follow up with [facility] the [place]. On 12/01/2012. (Walk in Monday - Friday 8-12 pm or 1-3 pm)    Contact information:   [address] [place], [place] [address] [phone]         Patient denies SI/HI:   Yes,  denies SI/HI    Safety Planning and Suicide Prevention discussed:  Yes,  discussed with pt  No recommendations from CSW.  No further needs voiced by pt.  Pt stable to discharge.    Carmina Miller 11/30/2012, 10:48 AM

## 2012-11-30 NOTE — Progress Notes (Signed)
Patient ID: Sean Mccoy, male   DOB: 1961/05/29, 51 y.o.   MRN: [id number] He has been discharged home and was going to ride the bus home. He voiced understanding of discharge instruction and of follow up plan. He denies thoughts of SI and HI. Alll belongings taken home with him.

## 2012-11-30 NOTE — Tx Team (Signed)
Interdisciplinary Treatment Plan Update (Adult)  Date:  11/30/2012  Time Reviewed:  10:47 AM   Progress in Treatment: Attending groups: Yes Participating in groups:  Yes Taking medication as prescribed: Yes Tolerating medication:  Yes Family/Significant othe contact made:  Yes Patient understands diagnosis:  Yes Discussing patient identified problems/goals with staff:  Yes Medical problems stabilized or resolved:  Yes Denies suicidal/homicidal ideation: Yes Issues/concerns per patient self-inventory:  None identified Other: N/A  New problem(s) identified: None Identified  Reason for Continuation of Hospitalization: Stable to d/c  Interventions implemented related to continuation of hospitalization: Stable to d/c  Additional comments: N/A  Estimated length of stay: D/C today  Discharge Plan: Pt will follow up with [facility] [place] for medication management and therapy.    New goal(s): N/A  Review of initial/current patient goals per problem list:    1.  Goal(s): Address substance use by completing detox protocol  Met:  Yes  Target date: today   As evidenced by:   2.  Goal (s): Reduce depressive symptoms by reducing from a 10 to a 3  Met:  Yes  Target date: today  As evidenced by: Pt rates at a 3 today.     3.  Goal (s): Reduce anxiety symptoms by reducing from a 10 to a 3  Met:  Yes  Target date: today  As evidenced by: Pt rates at a 3 today.    4.  Goal(s): Eliminate SI  Met:  Yes  Target date: by discharge  As evidenced by: Pt denies SI.    Attendees: Patient:   11/30/2012 10:47 AM   Family:     Physician:  Geoffery Lyons, MD 11/30/2012 10:47 AM   Nursing:  11/30/2012 10:47 AM   Clinical Social Worker:  Reyes Ivan, LCSWA 11/30/2012 10:47 AM   Other:  11/30/2012 10:47 AM   Other:     Other:     Other:     Other:      Scribe for Treatment Team:   Reyes Ivan 11/30/2012 10:47 AM
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
## 2012-12-04 NOTE — Progress Notes (Signed)
Patient Discharge Instructions:  After Visit Summary (AVS):   Faxed to:  12/04/12 Psychiatric Admission Assessment Note:   Faxed to:  12/04/12 Suicide Risk Assessment - Discharge Assessment:   Faxed to:  12/04/12 Faxed/Sent to the Next Level Care provider:  12/04/12 Faxed to [facility] the [facility] @ [phone]  Jerelene Redden, 12/04/2012, 3:48 PM
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
## 2012-12-12 NOTE — Discharge Summary (Signed)
Agree with assessment and plan Sean Mccoy A. Sean Mccoy, M.D. 

## 2014-06-20 ENCOUNTER — Encounter (HOSPITAL_COMMUNITY): Payer: Self-pay | Admitting: Emergency Medicine

## 2014-06-20 ENCOUNTER — Emergency Department (HOSPITAL_COMMUNITY)
Admission: EM | Admit: 2014-06-20 | Discharge: 2014-06-20 | Disposition: A | Discharge status: Home / Self Care | Payer: Self-pay | Attending: Emergency Medicine | Admitting: Emergency Medicine

## 2014-06-20 DIAGNOSIS — I10 Essential (primary) hypertension: Secondary | ICD-10-CM | POA: Insufficient documentation

## 2014-06-20 DIAGNOSIS — Z79899 Other long term (current) drug therapy: Secondary | ICD-10-CM | POA: Insufficient documentation

## 2014-06-20 DIAGNOSIS — IMO0002 Reserved for concepts with insufficient information to code with codable children: Secondary | ICD-10-CM | POA: Insufficient documentation

## 2014-06-20 DIAGNOSIS — T733XXA Exhaustion due to excessive exertion, initial encounter: Secondary | ICD-10-CM | POA: Insufficient documentation

## 2014-06-20 DIAGNOSIS — M129 Arthropathy, unspecified: Secondary | ICD-10-CM | POA: Insufficient documentation

## 2014-06-20 DIAGNOSIS — X501XXA Overexertion from prolonged static or awkward postures, initial encounter: Secondary | ICD-10-CM | POA: Insufficient documentation

## 2014-06-20 DIAGNOSIS — Y9229 Other specified public building as the place of occurrence of the external cause: Secondary | ICD-10-CM | POA: Insufficient documentation

## 2014-06-20 DIAGNOSIS — T148XXA Other injury of unspecified body region, initial encounter: Secondary | ICD-10-CM

## 2014-06-20 DIAGNOSIS — Y9389 Activity, other specified: Secondary | ICD-10-CM | POA: Insufficient documentation

## 2014-06-20 MED ORDER — OXYCODONE-ACETAMINOPHEN 5-325 MG PO TABS
1.0000 | ORAL_TABLET | Freq: Once | ORAL | Status: AC
  Administered 2014-06-20: 1 via ORAL
  Filled 2014-06-20: qty 1

## 2014-06-20 MED ORDER — HYDROCODONE-ACETAMINOPHEN 5-325 MG PO TABS: 1.0000 | ORAL_TABLET | Freq: Four times a day (QID) | ORAL | Status: DC | PRN

## 2014-06-20 NOTE — ED Notes (Signed)
Crutches given with instruction. Pt wc to lobby

## 2014-06-20 NOTE — ED Notes (Signed)
Pt states he thinks he pulled something in his right thigh area. Also has an old rt knee injury that is starting to hurt from the pain of the thigh. Unable to bear weight.

## 2014-06-20 NOTE — ED Provider Notes (Signed)
CSN: [id number]     Arrival date & time 06/20/14  1616 History   None    Chief Complaint  Patient presents with  . Leg Pain     (Consider location/radiation/quality/duration/timing/severity/associated sxs/prior Treatment) HPI Complains of right leg pain at groin onset 2 weeks ago when he was pushing an object at work. Pain was gradual onsets her office a slight twinge is worse with weightbearing improved with remaining still. Also complains of right knee pain with walking. He has been walking with a limp for approximately 2 weeks. He treats himself with ibuprofen with relief. No injury no fever Past Medical History  Diagnosis Date  . Hypertension   . Arthritis    History reviewed. No pertinent past surgical history. No family history on file. History  Substance Use Topics  . Smoking status: Never Smoker   . Smokeless tobacco: Not on file  . Alcohol Use: Yes    Review of Systems  Constitutional: Negative.   HENT: Negative.   Respiratory: Negative.   Cardiovascular: Negative.   Gastrointestinal: Negative.   Musculoskeletal: Positive for arthralgias.       Right groin pain right knee  Skin: Negative.   Neurological: Negative.   Psychiatric/Behavioral: Negative.   All other systems reviewed and are negative.     Allergies  Review of patient's allergies indicates no known allergies.  Home Medications   Prior to Admission medications   Medication Sig Start Date End Date Taking? Authorizing Luis Sami  diclofenac (VOLTAREN) 75 MG EC tablet Take 75 mg by mouth daily as needed. For pain    Historical Haru Anspaugh, MD  DULoxetine (CYMBALTA) 20 MG capsule Take 1 capsule (20 mg total) by mouth daily. For depression. 11/30/12   Verne Spurr, PA-C  hydrochlorothiazide (HYDRODIURIL) 25 MG tablet Take 1 tablet (25 mg total) by mouth every morning. For hypertension. 11/30/12   Verne Spurr, PA-C  pravastatin (PRAVACHOL) 20 MG tablet Take 1 tablet (20 mg total) by mouth every other day.  For cholesterol. 11/30/12   Verne Spurr, PA-C  predniSONE (STERAPRED UNI-PAK) 5 MG TABS Take 1 mg by mouth daily as needed. For inflammation    Historical Breda Bond, MD  traZODone (DESYREL) 50 MG tablet Take 1 tablet (50 mg total) by mouth at bedtime as needed for sleep (may repeat times 1). 11/30/12   Verne Spurr, PA-C   correction patient's only medication presently is ibuprofen BP 124/85  Pulse 102  Temp(Src) 98.2 F (36.8 C) (Oral)  Resp 20  SpO2 97% Physical Exam  Nursing note and vitals reviewed. Constitutional: He appears well-developed and well-nourished.  HENT:  Head: Normocephalic and atraumatic.  Eyes: Conjunctivae are normal. Pupils are equal, round, and reactive to light.  Neck: Neck supple. No tracheal deviation present. No thyromegaly present.  Cardiovascular: Normal rate and regular rhythm.   No murmur heard. Pulse counted 88 by me  Abdominal: Soft. Bowel sounds are normal. He exhibits no distension.  Musculoskeletal: Normal range of motion.  Right lower extremity no swelling no deformity. Pain and groin on  active flexion or weightbearing no redness no warmth. DP pulse 2+. Knee is nontender. Not red or swollen All other tremors or redness or tenderness neurovascular intact  Neurological: He is alert. Coordination normal.  Skin: Skin is warm and dry. No rash noted.  Psychiatric: He has a normal mood and affect.    ED Course  Procedures (including critical care time) Labs Review Labs Reviewed - No data to display  Imaging Review No results found.  EKG Interpretation None     Patient feels improved after treatment with Percocet here MDM  Patient suffered from groin strain which causes him to limp and exacerbates pain  right knee. Patient had old knee fracture from several years ago Final diagnoses:  None   x-rays not indicated discuss with patient who agrees Plan crutches prescription Norco. Referral resource guide and wellness Center Diagnosis right  groin strain    Doug Sou, MD 06/20/14 [phone]

## 2014-06-20 NOTE — Discharge Instructions (Signed)
Muscle Strain Take Tylenol or Advil for mild pain or the pain medicine prescribed for bad pain. Call wellness Center to get a primary care physician or any of the numbers on the resource guide. Make an appointment to be seen if not improved by next week or you can go to any urgent care Center A muscle strain (pulled muscle) happens when a muscle is stretched beyond normal length. It happens when a sudden, violent force stretches your muscle too far. Usually, a few of the fibers in your muscle are torn. Muscle strain is common in athletes. Recovery usually takes 1-2 weeks. Complete healing takes 5-6 weeks.  HOME CARE   Follow the PRICE method of treatment to help your injury get better. Do this the first 2-3 days after the injury:  Protect. Protect the muscle to keep it from getting injured again.  Rest. Limit your activity and rest the injured body part.  Ice. Put ice in a plastic bag. Place a towel between your skin and the bag. Then, apply the ice and leave it on from 15-20 minutes each hour. After the third day, switch to moist heat packs.  Compression. Use a splint or elastic bandage on the injured area for comfort. Do not put it on too tightly.  Elevate. Keep the injured body part above the level of your heart.  Only take medicine as told by your doctor.  Warm up before doing exercise to prevent future muscle strains. GET HELP IF:   You have more pain or puffiness (swelling) in the injured area.  You feel numbness, tingling, or notice a loss of strength in the injured area. MAKE SURE YOU:   Understand these instructions.  Will watch your condition.  Will get help right away if you are not doing well or get worse. Document Released: 09/21/2008 Document Revised: 10/03/2013 Document Reviewed: 07/12/2013 [facility] Patient Information 2015 [place], [place]. This information is not intended to replace advice given to you by your health care provider. Make sure you discuss any questions  you have with your health care provider. Crutch Use Crutches are used to take weight off one of your legs or feet when you stand or walk. It is important to use crutches that fit properly. When fitted properly:  Each crutch should be 2-3 finger widths below the armpit.  Your weight should be supported by your hand, and not by resting the armpit on the crutch.  RISKS AND COMPLICATIONS Damage to the nerves that extend from your armpit to your hand and arm. To prevent this from happening, make sure your crutches fit properly and do not put pressure on your armpit when using them. HOW TO USE YOUR CRUTCHES If you have been instructed to use partial weight bearing, apply (bear) the amount of weight as your health care provider suggests. Do not bear weight in an amount that causes pain to the area of injury. Walking 1. Step with the crutches. 2. Swing the healthy leg slightly ahead of the crutches. Going Up Steps If there is no handrail: 1. Step up with the healthy leg. 2. Step up with the crutches and injured leg. 3. Continue in this way. If there is a handrail: 1. Hold both crutches in one hand. 2. Place your free hand on the handrail. 3. While putting your weight on your arms, lift your healthy leg to the step. 4. Bring the crutches and the injured leg up to that step. 5. Continue in this way. Going Down Steps Be very  careful, as going down stairs with crutches is very challenging. If there is no handrail: 1. Step down with the injured leg and crutches. 2. Step down with the healthy leg. If there is a handrail: 1. Place your hand on the handrail. 2. Hold both crutches with your free hand. 3. Lower your injured leg and crutch to the step below you. Make sure to keep the crutch tips in the center of the step, never on the edge. 4. Lower your healthy leg to that step. 5. Continue in this way. Standing Up 1. Hold the injured leg forward. 2. Grab the armrest with one hand and the top of  the crutches with the other hand. 3. Using these supports, pull yourself up to a standing position. Sitting Down 1. Hold the injured leg forward. 2. Grab the armrest with one hand and the top of the crutches with the other hand. 3. Lower yourself to a sitting position. SEEK MEDICAL CARE IF:  You still feel unsteady on your feet.  You develop new pain, for example in your armpits, back, shoulder, wrist, or hip.  You develop any numbness or tingling. SEEK IMMEDIATE MEDICAL CARE IF: You fall. Document Released: 12/10/2000 Document Revised: 12/18/2013 Document Reviewed: 08/20/2013 [facility] Patient Information 2015 [place], [place]. This information is not intended to replace advice given to you by your health care provider. Make sure you discuss any questions you have with your health care provider.  Emergency Department Resource Guide 1) Find a Doctor and Pay Out of Pocket Although you won't have to find out who is covered by your insurance plan, it is a good idea to ask around and get recommendations. You will then need to call the office and see if the doctor you have chosen will accept you as a new patient and what types of options they offer for patients who are self-pay. Some doctors offer discounts or will set up payment plans for their patients who do not have insurance, but you will need to ask so you aren't surprised when you get to your appointment.  2) Contact Your Local Health Department Not all health departments have doctors that can see patients for sick visits, but many do, so it is worth a call to see if yours does. If you don't know where your local health department is, you can check in your phone book. The CDC also has a tool to help you locate your state's health department, and many state websites also have listings of all of their local health departments.  3) Find a Walk-in Clinic If your illness is not likely to be very severe or complicated, you may want to try a walk  in clinic. These are popping up all over the country in pharmacies, drugstores, and shopping centers. They're usually staffed by nurse practitioners or physician assistants that have been trained to treat common illnesses and complaints. They're usually fairly quick and inexpensive. However, if you have serious medical issues or chronic medical problems, these are probably not your best option.  No Primary Care Doctor: - Call Health Connect at  [phone] - they can help you locate a primary care doctor that  accepts your insurance, provides certain services, etc. - Physician Referral Service- [phone]  Chronic Pain Problems: Organization         Address  Phone   Notes  [facility]  [phone] Patients need to be referred by their primary care doctor.   Medication Assistance: Organization  Address  Phone   Notes  [facility] Medication [facility] [address] [place] [place], [place] [address] [phone] --Must be a resident of [facility] -- Must have NO insurance coverage whatsoever (no Medicaid/ Medicare, etc.) -- The pt. MUST have a primary care doctor that directs their care regularly and follows them in the community   [facility]ist  [phone]   [employer]  [phone]    Agencies that provide inexpensive medical care: Organization         Address  Phone   Notes  [facility]  [phone]   [facility]    [phone]   [facility] [address] [place], [place] [address] [phone]   [facility] [address] [place] [place] [phone]   Planned Parenthood    [phone]   [facility]    [phone]   [facility]  [address], [place] Phone:  [phone], Fax:  [phone] Hours of Operation:  9 am - 6 pm, M-F.  Also accepts Medicaid/Medicare and self-pay.  [facility]  [address], [place] Phone: [phone], Fax: [phone]. Hours of Operation:  8:30 am - 5:30 pm, M-F.  Also accepts Medicaid and self-pay.  [name] [place] [address] [place] Phone: [phone]   [facility] [address] Natasha Bence [place], [place] [phone], Ext. [phone] Mondays & Thursdays: 7-9 AM.  First 15 patients are seen on a first come, first serve basis.    Medicaid-accepting [facility] Providers:  Organization         Address  Phone   Notes  [facility] [address], [place] [phone] Also accepts self-pay patients.  [facility] [address] Laurell Josephs [place], [place]  [phone]   [facility] [address], [place] [phone]   [facility] [address], [place] [phone]   Renaye Rakers [address], [place]   [phone] Only accepts [place] Access [place] patients after they have their name applied to their card.   Self-Pay (no insurance) in [facility]:  Organization         Address  Phone   Notes  Sickle Cell Patients, [facility] Internal Medicine [address] [place], [place] [phone]   [facility] [address] [place], [place] [phone]   [facility] [place]  [address], [place] [phone]   [facility]/Dr. Osei-Bonsu  [address] [place] or [address], [place] [phone] Phone number for both [place] and [place] locations is the same.  [facility] [address], [place] [phone]   [facility] [address] [place] or [address] [phone]   [facility] [address], [place] [phone], phone; [phone], fax Sees  patients 1st and 3rd Saturday of every month.  Must not qualify for public or private insurance (i.e. Medicaid, Medicare, [place] Health Choice, Veterans' Benefits)  Household income should be no more than 200% of the poverty level The clinic cannot treat you if you are pregnant or think you  are pregnant  Sexually transmitted diseases are not treated at the clinic.    Dental Care: Organization         Address  Phone  Notes  [facility] Department of [facility] [place] [phone] Accepts children up to age 81 who are enrolled in [place] or [place]; pregnant women with a Medicaid card; and children who have applied for Medicaid or [place] Health Choice, but were declined, whose parents can pay a reduced fee at time of service.  [facility] Department of [facility]  [address] [place] [address], [place] [phone] Accepts children up to age 56 who are enrolled in [place] or [place]; pregnant women with a Medicaid card; and children who have applied for Medicaid or [place] Health Choice, but were declined, whose parents can pay a reduced fee at time of service.  [place] [facility] Access PRO[facility]  [place] [phone] Patients are seen by appointment only. Walk-ins are not accepted. [place] will see patients 44 years of age and older. Monday - Tuesday (8am-5pm) Most Wednesdays (8:30-5pm) $30 per visit, cash only  [facility] Adult [facility] Access PRO[facility]  [address], [facility] [phone] Patients are seen by appointment only. Walk-ins are not accepted. [place] will see patients 16 years of age and older. One Wednesday Evening (Monthly: Volunteer Based).  $30 per visit, cash only  [place]  [phone] for adults; Children under age 65, call Graduate Pediatric Dentistry at [phone]. Children aged 72-14, please call [phone] to request a  pediatric application.  Dental services are provided in all areas of dental care including fillings, crowns and bridges, complete and partial dentures, implants, gum treatment, root canals, and extractions. Preventive care is also provided. Treatment is provided to both adults and children. Patients are selected via a lottery and there is often a waiting list.   [facility] [address] [place]  [phone] [email or web address]   [facility] [address] [place], [place] [phone], Ext. [phone] Second and Fourth Thursday of each month, opens at 6:30 AM; Clinic ends at 9 AM.  Patients are seen on a first-come first-served basis, and a limited number are seen during each clinic.   [facility]  [address] Hillard Danker [place], [place] [phone]   Eligibility Requirements You must have lived in [place], [place], or [place] counties for at least the last three months.   You cannot be eligible for state or federal sponsored [employer], including [employer], [place], or [employer].   You generally cannot be eligible for healthcare insurance through your employer.    How to apply: Eligibility screenings are held every Tuesday and Wednesday afternoon from 1:00 pm until 4:00 pm. You do not need an appointment for the interview!  [facility] [address], [place], [place]   [place]  [place] Department  [place]  [phone]    Behavioral Health Resources in the Community: Intensive Outpatient Programs Organization         Address  Phone  Notes  [place]. [address], [place], [place] [phone]   [facility] Outpatient [address] [place], [place]   ADS: Alcohol [facility] Drug S[facility] [address],  [place], [place]   [place] [address]  [place], [place] or [phone]   Substance Abuse Resources Organization         Address  Phone  Notes  Al[facility] Drug Services  [phone]   [place]  [phone]   [facility] [place]   Chinita Pester  [phone]   Residential & Outpatient Substance Abuse Program  [phone]   Psychological Services Organization         Address  Phone  Notes  [facility] [place]  [place]  [phone]   [place] [address] [place] or [phone]    Mobile Crisis Teams Organization         Address  Phone  Notes  [facility], Mobile Crisis Care Unit  [phone]   [facility]  [address] [place], [place]   [place] Levels [address] [place] [phone]    Self-Help/Support Groups Organization         Address  Phone             Notes  [place] [place] - variety of support groups  [place] Call for more information  Narcotics Anonymous (NA), [facility] [address], [employer] [place]  2 meetings at this location   Special educational needs teacher         Address  Phone  Notes  [facility] [place],    [place]  [phone]   [facility]  [address], [place] [address], [place], [place]   [place], [place] [phone] Admissions: 8am-3pm M-F  Incentives Substance [place] [address],    [place], [place] [phone]   [facility] [address] [place], [place], [place]   [facility] [address]  [place], [place]   [facility] - Intensive Outpatient [address] Kristeen Mans [address], [place], [place]   [facility] ([place].,    [place], [place] [phone] or [phone]   [facility] ([facility]) [address], [place], [place] Accepts Medicaid  [facility] [place] [address],  [place] [phone] Substance Abuse/Addiction Treatment   [facility] Organization         Address  Phone  Notes  [facility]  [phone]   Domenic Schwab, PhD [address] Arlis Porta [place], [place]   [phone] or [phone]   [place], [place] [phone]   [facility] [address], [place], [place] [phone] Insurance/Medicaid/sponsorship through [facility] and Families [address]                                    [place], [place] [phone] [place] [address] [place], [place] [phone]    Dr. Adele Schilder  [phone]   [facility] Dept. 1) [address] [place] 2) [place] 3)  [employer] [address], [place] [phone]  [phone]   [place] (  [phone] or [phone] (After Hours)    ° ° °

## 2014-07-15 ENCOUNTER — Ambulatory Visit: Payer: Self-pay | Admitting: Internal Medicine

## 2014-07-19 ENCOUNTER — Ambulatory Visit: Payer: Self-pay | Attending: Internal Medicine | Admitting: Internal Medicine

## 2014-07-19 ENCOUNTER — Encounter: Payer: Self-pay | Admitting: Internal Medicine

## 2014-07-19 VITALS — BP 134/87 | HR 95 | Temp 99.0°F | Resp 16 | Ht 71.0 in | Wt 167.0 lb

## 2014-07-19 DIAGNOSIS — M79609 Pain in unspecified limb: Secondary | ICD-10-CM | POA: Insufficient documentation

## 2014-07-19 DIAGNOSIS — M79604 Pain in right leg: Secondary | ICD-10-CM

## 2014-07-19 DIAGNOSIS — Z7189 Other specified counseling: Secondary | ICD-10-CM | POA: Insufficient documentation

## 2014-07-19 DIAGNOSIS — F4310 Post-traumatic stress disorder, unspecified: Secondary | ICD-10-CM | POA: Insufficient documentation

## 2014-07-19 DIAGNOSIS — M129 Arthropathy, unspecified: Secondary | ICD-10-CM | POA: Insufficient documentation

## 2014-07-19 DIAGNOSIS — Z7689 Persons encountering health services in other specified circumstances: Secondary | ICD-10-CM

## 2014-07-19 DIAGNOSIS — I10 Essential (primary) hypertension: Secondary | ICD-10-CM | POA: Insufficient documentation

## 2014-07-19 MED ORDER — TRAMADOL HCL 50 MG PO TABS: 50.0000 mg | ORAL_TABLET | Freq: Two times a day (BID) | ORAL | Status: DC | PRN

## 2014-07-19 NOTE — Progress Notes (Signed)
Pt is here to establish care. Pt reports having pain in his right knee and that he strained his groin area.

## 2014-07-19 NOTE — Progress Notes (Signed)
LCSW met with patient in order to provide resources.  LCSW provided job list and contact information to set up an appointment for rent support with [employer].  Patient encouraged to follow up with this LCSW as needed.  Christene Lye MSW, LCSW

## 2014-07-19 NOTE — Patient Instructions (Signed)
DASH Eating Plan °DASH stands for "Dietary Approaches to Stop Hypertension." The DASH eating plan is a healthy eating plan that has been shown to reduce high blood pressure (hypertension). Additional health benefits may include reducing the risk of type 2 diabetes mellitus, heart disease, and stroke. The DASH eating plan may also help with weight loss. °WHAT DO I NEED TO KNOW ABOUT THE DASH EATING PLAN? °For the DASH eating plan, you will follow these general guidelines: °· Choose foods with a percent daily value for sodium of less than 5% (as listed on the food label). °· Use salt-free seasonings or herbs instead of table salt or sea salt. °· Check with your health care provider or pharmacist before using salt substitutes. °· Eat lower-sodium products, often labeled as "lower sodium" or "no salt added." °· Eat fresh foods. °· Eat more vegetables, fruits, and low-fat dairy products. °· Choose whole grains. Look for the word "whole" as the first word in the ingredient list. °· Choose fish and skinless chicken or turkey more often than red meat. Limit fish, poultry, and meat to 6 oz (170 g) each day. °· Limit sweets, desserts, sugars, and sugary drinks. °· Choose heart-healthy fats. °· Limit cheese to 1 oz (28 g) per day. °· Eat more home-cooked food and less restaurant, buffet, and fast food. °· Limit fried foods. °· Cook foods using methods other than frying. °· Limit canned vegetables. If you do use them, rinse them well to decrease the sodium. °· When eating at a restaurant, ask that your food be prepared with less salt, or no salt if possible. °WHAT FOODS CAN I EAT? °Seek help from a dietitian for individual calorie needs. °Grains °Whole grain or whole wheat bread. Brown rice. Whole grain or whole wheat pasta. Quinoa, bulgur, and whole grain cereals. Low-sodium cereals. Corn or whole wheat flour tortillas. Whole grain cornbread. Whole grain crackers. Low-sodium crackers. °Vegetables °Fresh or frozen vegetables  (raw, steamed, roasted, or grilled). Low-sodium or reduced-sodium tomato and vegetable juices. Low-sodium or reduced-sodium tomato sauce and paste. Low-sodium or reduced-sodium canned vegetables.  °Fruits °All fresh, canned (in natural juice), or frozen fruits. °Meat and Other Protein Products °Ground beef (85% or leaner), grass-fed beef, or beef trimmed of fat. Skinless chicken or turkey. Ground chicken or turkey. Pork trimmed of fat. All fish and seafood. Eggs. Dried beans, peas, or lentils. Unsalted nuts and seeds. Unsalted canned beans. °Dairy °Low-fat dairy products, such as skim or 1% milk, 2% or reduced-fat cheeses, low-fat ricotta or cottage cheese, or plain low-fat yogurt. Low-sodium or reduced-sodium cheeses. °Fats and Oils °Tub margarines without trans fats. Light or reduced-fat mayonnaise and salad dressings (reduced sodium). Avocado. Safflower, olive, or canola oils. Natural peanut or almond butter. °Other °Unsalted popcorn and pretzels. °The items listed above may not be a complete list of recommended foods or beverages. Contact your dietitian for more options. °WHAT FOODS ARE NOT RECOMMENDED? °Grains °White bread. White pasta. White rice. Refined cornbread. Bagels and croissants. Crackers that contain trans fat. °Vegetables °Creamed or fried vegetables. Vegetables in a cheese sauce. Regular canned vegetables. Regular canned tomato sauce and paste. Regular tomato and vegetable juices. °Fruits °Dried fruits. Canned fruit in light or heavy syrup. Fruit juice. °Meat and Other Protein Products °Fatty cuts of meat. Ribs, chicken wings, bacon, sausage, bologna, salami, chitterlings, fatback, hot dogs, bratwurst, and packaged luncheon meats. Salted nuts and seeds. Canned beans with salt. °Dairy °Whole or 2% milk, cream, half-and-half, and cream cheese. Whole-fat or sweetened yogurt. Full-fat   cheeses or blue cheese. Nondairy creamers and whipped toppings. Processed cheese, cheese spreads, or cheese  curds. °Condiments °Onion and garlic salt, seasoned salt, table salt, and sea salt. Canned and packaged gravies. Worcestershire sauce. Tartar sauce. Barbecue sauce. Teriyaki sauce. Soy sauce, including reduced sodium. Steak sauce. Fish sauce. Oyster sauce. Cocktail sauce. Horseradish. Ketchup and mustard. Meat flavorings and tenderizers. Bouillon cubes. Hot sauce. Tabasco sauce. Marinades. Taco seasonings. Relishes. °Fats and Oils °Butter, stick margarine, lard, shortening, ghee, and bacon fat. Coconut, palm kernel, or palm oils. Regular salad dressings. °Other °Pickles and olives. Salted popcorn and pretzels. °The items listed above may not be a complete list of foods and beverages to avoid. Contact your dietitian for more information. °WHERE CAN I FIND MORE INFORMATION? °National Heart, Lung, and Blood Institute: www.nhlbi.nih.gov/health/health-topics/topics/dash/ °Document Released: 12/02/2011 Document Revised: 04/29/2014 Document Reviewed: 10/17/2013 °ExitCare® Patient Information ©2015 [employer], LLC. This information is not intended to replace advice given to you by your health care provider. Make sure you discuss any questions you have with your health care provider. ° °

## 2014-07-19 NOTE — Progress Notes (Signed)
Patient ID: Sean Mccoy, male   DOB: 04/14/1961, 53 y.o.   MRN: [id number]  HFW:[id number]  [id number]:[id number]  DOB - 07-Nov-1961  CC:  Chief Complaint  Patient presents with  . Establish Care       HPI: Sean Mccoy is a 53 y.o. male here today to establish medical care.  Patient reports that he was seen in the ER for a muscle strain last month.  Patient reports that he is a veteran and was diagnosed with PTSD and is having difficulty with stress.  He reports he has been having severe pain in his right leg, knee, and groin.  He c/o of pain from a old fractured knee in 1997.  He reports pain in his groin and leg for over one month.  He reports that he has been using ibuprofen for pain.   No Known Allergies Past Medical History  Diagnosis Date  . Hypertension   . Arthritis    Current Outpatient Prescriptions on File Prior to Visit  Medication Sig Dispense Refill  . diclofenac (VOLTAREN) 75 MG EC tablet Take 75 mg by mouth daily as needed. For pain      . DULoxetine (CYMBALTA) 20 MG capsule Take 1 capsule (20 mg total) by mouth daily. For depression.  30 capsule  0  . hydrochlorothiazide (HYDRODIURIL) 25 MG tablet Take 1 tablet (25 mg total) by mouth every morning. For hypertension.  30 tablet  0  . HYDROcodone-acetaminophen (NORCO) 5-325 MG per tablet Take 1 tablet by mouth every 6 (six) hours as needed for severe pain.  12 tablet  0  . pravastatin (PRAVACHOL) 20 MG tablet Take 1 tablet (20 mg total) by mouth every other day. For cholesterol.      . predniSONE (STERAPRED UNI-PAK) 5 MG TABS Take 1 mg by mouth daily as needed. For inflammation      . traZODone (DESYREL) 50 MG tablet Take 1 tablet (50 mg total) by mouth at bedtime as needed for sleep (may repeat times 1).  30 tablet  0   No current facility-administered medications on file prior to visit.   History reviewed. No pertinent family history. History   Social History  . Marital Status: Single    Spouse Name: N/A    Number of  Children: N/A  . Years of Education: N/A   Occupational History  . Not on file.   Social History Main Topics  . Smoking status: Never Smoker   . Smokeless tobacco: Not on file  . Alcohol Use: Yes  . Drug Use: No  . Sexual Activity:    Other Topics Concern  . Not on file   Social History Narrative  . No narrative on file    Review of Systems: Constitutional: Negative for fever, chills, diaphoresis, activity change, appetite change and fatigue. HENT: Negative for ear pain, nosebleeds, congestion, facial swelling, rhinorrhea, neck pain, neck stiffness and ear discharge.  Eyes: Negative for pain, discharge, redness, itching and visual disturbance. Respiratory: Negative for cough, choking, chest tightness, shortness of breath, wheezing and stridor.  Cardiovascular: Negative for chest pain, palpitations and leg swelling. Gastrointestinal: Negative for abdominal distention. Genitourinary: Negative for dysuria, urgency, frequency, hematuria, flank pain, decreased urine volume, difficulty urinating and dyspareunia.  Neurological: Negative for dizziness, tremors, seizures, syncope, facial asymmetry, speech difficulty, weakness, light-headedness, numbness and headaches.  Hematological: Negative for adenopathy. Does not bruise/bleed easily. Psychiatric/Behavioral: Negative for hallucinations, behavioral problems, confusion, dysphoric mood, decreased concentration and agitation.    Objective:   Filed Vitals:  07/19/14 1639  BP: 134/87  Pulse: 95  Temp: 99 F (37.2 C)  Resp: 16    Physical Exam: Constitutional: Patient appears well-developed and well-nourished. No distress. HENT: Normocephalic, atraumatic, External right and left ear normal. Oropharynx is clear and moist.  Eyes: Conjunctivae and EOM are normal. PERRLA, no scleral icterus. Neck: Normal ROM. Neck supple. No JVD. No tracheal deviation. No thyromegaly. CVS: RRR, S1/S2 +, no murmurs, no gallops, no carotid bruit.    Pulmonary: Effort and breath sounds normal, no stridor, rhonchi, wheezes, rales.  Abdominal: Soft. BS +, no distension, tenderness, rebound or guarding.  Musculoskeletal: Normal range of motion. No edema and no tenderness.  Lymphadenopathy: No lymphadenopathy noted, cervical, inguinal or axillary Neuro: Alert. Normal reflexes, muscle tone coordination. No cranial nerve deficit. Skin: Skin is warm and dry. No rash noted. Not diaphoretic. No erythema. No pallor. Psychiatric: Normal mood and affect. Behavior, judgment, thought content normal.  Lab Results  Component Value Date   WBC 4.0 11/27/2012   HGB 12.4* 11/27/2012   HCT 36.4* 11/27/2012   MCV 92.9 11/27/2012   PLT 197 11/27/2012   Lab Results  Component Value Date   CREATININE 0.63 11/27/2012   BUN 9 11/27/2012   NA 136 11/27/2012   K 4.1 11/27/2012   CL 98 11/27/2012   CO2 21 11/27/2012    No results found for this basename: HGBA1C   Lipid Panel  No results found for this basename: chol, trig, hdl, cholhdl, vldl, ldlcalc       Assessment and plan:   Sean Mccoy was seen today for establish care.  Diagnoses and associated orders for this visit:  Encounter to establish care - CBC; Future - COMPLETE METABOLIC PANEL WITH GFR; Future - TSH; Future - Hemoglobin A1C; Future - Lipid panel; Future  Right leg pain - traMADol (ULTRAM) 50 MG tablet; Take 1 tablet (50 mg total) by mouth every 12 (twelve) hours as needed.     Return in about 3 weeks (around 08/09/2014) for Nurse Visit- BP check and Lab visit, 3 mo PCP.      Holland Commons, NP-C [facility] [phone] 07/22/2014, 10:51 PM

## 2014-08-22 ENCOUNTER — Telehealth: Payer: Self-pay

## 2014-08-22 ENCOUNTER — Emergency Department (HOSPITAL_COMMUNITY)
Admission: EM | Admit: 2014-08-22 | Discharge: 2014-08-23 | Disposition: A | Discharge status: Home / Self Care | Payer: Self-pay | Attending: Emergency Medicine | Admitting: Emergency Medicine

## 2014-08-22 ENCOUNTER — Encounter (HOSPITAL_COMMUNITY): Payer: Self-pay | Admitting: Emergency Medicine

## 2014-08-22 ENCOUNTER — Emergency Department (HOSPITAL_COMMUNITY): Payer: Self-pay

## 2014-08-22 DIAGNOSIS — S40021A Contusion of right upper arm, initial encounter: Secondary | ICD-10-CM

## 2014-08-22 DIAGNOSIS — S298XXA Other specified injuries of thorax, initial encounter: Secondary | ICD-10-CM | POA: Insufficient documentation

## 2014-08-22 DIAGNOSIS — S2249XA Multiple fractures of ribs, unspecified side, initial encounter for closed fracture: Secondary | ICD-10-CM | POA: Insufficient documentation

## 2014-08-22 DIAGNOSIS — S301XXA Contusion of abdominal wall, initial encounter: Secondary | ICD-10-CM | POA: Insufficient documentation

## 2014-08-22 DIAGNOSIS — Z79899 Other long term (current) drug therapy: Secondary | ICD-10-CM | POA: Insufficient documentation

## 2014-08-22 DIAGNOSIS — S20219A Contusion of unspecified front wall of thorax, initial encounter: Secondary | ICD-10-CM | POA: Insufficient documentation

## 2014-08-22 DIAGNOSIS — M87051 Idiopathic aseptic necrosis of right femur: Secondary | ICD-10-CM

## 2014-08-22 DIAGNOSIS — S2020XA Contusion of thorax, unspecified, initial encounter: Secondary | ICD-10-CM

## 2014-08-22 DIAGNOSIS — S2232XA Fracture of one rib, left side, initial encounter for closed fracture: Secondary | ICD-10-CM

## 2014-08-22 DIAGNOSIS — I10 Essential (primary) hypertension: Secondary | ICD-10-CM | POA: Insufficient documentation

## 2014-08-22 DIAGNOSIS — N400 Enlarged prostate without lower urinary tract symptoms: Secondary | ICD-10-CM | POA: Insufficient documentation

## 2014-08-22 DIAGNOSIS — S40029A Contusion of unspecified upper arm, initial encounter: Secondary | ICD-10-CM | POA: Insufficient documentation

## 2014-08-22 DIAGNOSIS — M129 Arthropathy, unspecified: Secondary | ICD-10-CM | POA: Insufficient documentation

## 2014-08-22 DIAGNOSIS — S1093XA Contusion of unspecified part of neck, initial encounter: Secondary | ICD-10-CM

## 2014-08-22 DIAGNOSIS — S0083XA Contusion of other part of head, initial encounter: Secondary | ICD-10-CM | POA: Insufficient documentation

## 2014-08-22 DIAGNOSIS — S0003XA Contusion of scalp, initial encounter: Secondary | ICD-10-CM | POA: Insufficient documentation

## 2014-08-22 DIAGNOSIS — M87059 Idiopathic aseptic necrosis of unspecified femur: Secondary | ICD-10-CM | POA: Insufficient documentation

## 2014-08-22 LAB — I-STAT CHEM 8, ED
BUN: 5 mg/dL — ABNORMAL LOW (ref 6–23)
CALCIUM ION: 1.11 mmol/L — AB (ref 1.12–1.23)
CHLORIDE: 101 meq/L (ref 96–112)
Creatinine, Ser: 0.9 mg/dL (ref 0.50–1.35)
Glucose, Bld: 107 mg/dL — ABNORMAL HIGH (ref 70–99)
HEMATOCRIT: 39 % (ref 39.0–52.0)
Hemoglobin: 13.3 g/dL (ref 13.0–17.0)
Potassium: 3.9 mEq/L (ref 3.7–5.3)
SODIUM: 134 meq/L — AB (ref 137–147)
TCO2: 20 mmol/L (ref 0–100)

## 2014-08-22 MED ORDER — OXYCODONE-ACETAMINOPHEN 5-325 MG PO TABS: 1.0000 | ORAL_TABLET | ORAL | Status: DC | PRN

## 2014-08-22 MED ORDER — IOHEXOL 300 MG/ML  SOLN
80.0000 mL | Freq: Once | INTRAMUSCULAR | Status: AC | PRN
  Administered 2014-08-22: 80 mL via INTRAVENOUS

## 2014-08-22 MED ORDER — FENTANYL CITRATE 0.05 MG/ML IJ SOLN
50.0000 ug | Freq: Once | INTRAMUSCULAR | Status: AC
  Administered 2014-08-22: 50 ug via NASAL

## 2014-08-22 MED ORDER — OXYCODONE-ACETAMINOPHEN 5-325 MG PO TABS
1.0000 | ORAL_TABLET | Freq: Once | ORAL | Status: AC
  Administered 2014-08-22: 1 via ORAL
  Filled 2014-08-22: qty 1

## 2014-08-22 MED ORDER — FENTANYL CITRATE 0.05 MG/ML IJ SOLN
INTRAMUSCULAR | Status: AC
  Filled 2014-08-22: qty 2

## 2014-08-22 MED ORDER — MORPHINE SULFATE 4 MG/ML IJ SOLN
4.0000 mg | Freq: Once | INTRAMUSCULAR | Status: AC
  Administered 2014-08-22: 4 mg via INTRAVENOUS
  Filled 2014-08-22: qty 1

## 2014-08-22 MED ORDER — FENTANYL CITRATE 0.05 MG/ML IJ SOLN: 50.0000 ug | Freq: Once | INTRAMUSCULAR | Status: DC

## 2014-08-22 NOTE — Discharge Instructions (Signed)
Read the information below.  Use the prescribed medication as directed.  Please discuss all new medications with your pharmacist.  Do not take additional tylenol while taking the prescribed pain medication to avoid overdose.  You may return to the Emergency Department at any time for worsening condition or any new symptoms that concern you.  If you develop uncontrolled pain, weakness or numbness of the extremity, severe discoloration of the skin, or you are unable to walk, return to the ER for a recheck.   If you develop worsening chest pain, shortness of breath, fever, you pass out, or become weak or dizzy, return to the ER for a recheck.     Assault, General Assault includes any behavior, whether intentional or reckless, which results in bodily injury to another person and/or damage to property. Included in this would be any behavior, intentional or reckless, that by its nature would be understood (interpreted) by a reasonable person as intent to harm another person or to damage his/her property. Threats may be oral or written. They may be communicated through regular mail, computer, fax, or phone. These threats may be direct or implied. FORMS OF ASSAULT INCLUDE:  Physically assaulting a person. This includes physical threats to inflict physical harm as well as:  Slapping.  Hitting.  Poking.  Kicking.  Punching.  Pushing.  Arson.  Sabotage.  Equipment vandalism.  Damaging or destroying property.  Throwing or hitting objects.  Displaying a weapon or an object that appears to be a weapon in a threatening manner.  Carrying a firearm of any kind.  Using a weapon to harm someone.  Using greater physical size/strength to intimidate another.  Making intimidating or threatening gestures.  Bullying.  Hazing.  Intimidating, threatening, hostile, or abusive language directed toward another person.  It communicates the intention to engage in violence against that person. And it  leads a reasonable person to expect that violent behavior may occur.  Stalking another person. IF IT HAPPENS AGAIN:  Immediately call for emergency help (911 in [place]  If someone poses clear and immediate danger to you, seek legal authorities to have a protective or restraining order put in place.  Less threatening assaults can at least be reported to authorities. STEPS TO TAKE IF A SEXUAL ASSAULT HAS HAPPENED  Go to an area of safety. This may include a shelter or staying with a friend. Stay away from the area where you have been attacked. A large percentage of sexual assaults are caused by a friend, relative or associate.  If medications were given by your caregiver, take them as directed for the full length of time prescribed.  Only take over-the-counter or prescription medicines for pain, discomfort, or fever as directed by your caregiver.  If you have come in contact with a sexual disease, find out if you are to be tested again. If your caregiver is concerned about the HIV/AIDS virus, he/she may require you to have continued testing for several months.  For the protection of your privacy, test results can not be given over the phone. Make sure you receive the results of your test. If your test results are not back during your visit, make an appointment with your caregiver to find out the results. Do not assume everything is normal if you have not heard from your caregiver or the medical facility. It is important for you to follow up on all of your test results.  File appropriate papers with authorities. This is important in all assaults, even if  it has occurred in a family or by a friend. SEEK MEDICAL CARE IF:  You have new problems because of your injuries.  You have problems that may be because of the medicine you are taking, such as:  Rash.  Itching.  Swelling.  Trouble breathing.  You develop belly (abdominal) pain, feel sick to your stomach (nausea) or are  vomiting.  You begin to run a temperature.  You need supportive care or referral to a rape crisis center. These are centers with trained personnel who can help you get through this ordeal. SEEK IMMEDIATE MEDICAL CARE IF:  You are afraid of being threatened, beaten, or abused. In [place] call 911.  You receive new injuries related to abuse.  You develop severe pain in any area injured in the assault or have any change in your condition that concerns you.  You faint or lose consciousness.  You develop chest pain or shortness of breath. Document Released: 12/13/2005 Document Revised: 03/06/2012 Document Reviewed: 07/31/2008 [facility] Patient Information 2015 [place], [place]. This information is not intended to replace advice given to you by your health care provider. Make sure you discuss any questions you have with your health care provider.  Avascular Necrosis Avascular necrosis is a disease resulting from the temporary or permanent loss of the blood supply to the bones. Without blood, the bone tissue dies and causes the bone to become soft. If the process involves the bone near a joint, it may lead to collapse of the joint surface. This disease is also known as:  Osteonecrosis.  Aseptic necrosis.  Ischemic bone necrosis. Avascular necrosis most commonly affects the ends (epiphysis) of long bones. The femur, the bone extending from the knee joint to the hip joint, is the bone most commonly involved. The disease may affect 1 bone, more than 1 bone at the same time, more than 1 bone at different times. It affects men and women equally. Avascular necrosis occurs at any age. But it is more common between the ages of 36 and 50 years. SYMPTOMS  In early stages patients may not have any symptoms. But as the disease progresses, joint pain generally develops. At first there is pain when putting weight on the affected joint, and then when resting. Pain usually develops gradually. It may be mild or  severe. As the disease progresses and the bone and surrounding joint surface collapses, pain may develop or increase dramatically. Pain may be severe enough to limit range of motion in the affected joint. The period of time between the first symptoms and loss of joint function is different for each patient. This can range from several months to more than a year. Disability depends on:  What part of the bone is affected.  How large an area is involved.  How effectively the bone repairs itself.  If other illnesses are present.  If you are being treated for cancer with medications (chemotherapy).  Radiation.  The cause of the avascular necrosis. DIAGNOSIS  The diagnosis of aseptic necrosis is usually made by:  Taking a history.  Doing an exam.  Taking X-rays. (If X-rays are normal, an MRI may be required.)  Sometimes further blood work and specialized studies may be necessary. TREATMENT  Treatment for this disease is necessary to maintain joint function. If untreated, most patients will suffer severe pain and limitation in movement within 2 years. Several treatments are available that help prevent further bone and joint damage. They can also reduce pain. To determine the most appropriate treatment,  the caregiver considers the following aspects of a patient's disease:  The age of the patient.  The stage of the disease (early or late).  The location and amount of bone affected. It may be a small or large area.  The underlying cause of avascular necrosis. The goals in treatment are to:  Improve the patient's use of the affected joint.  Stop further damage to the bone.  Improve bone and joint survival. Your caregiver may use one or more of the following treatments:  Reduced weight bearing. If avascular necrosis is diagnosed early, the caregiver may begin treatment by having the patient limit weight on the affected joint. The caregiver may recommend limiting activities or using  crutches. In some cases, reduced weight bearing can slow the damage caused by the disease and permit natural healing. When combined with medication to reduce pain, reduced weight bearing can be an effective way to avoid or delay surgery for some patients. Most patients eventually will need surgery to reconstruct the joint.  Core decompression. Core decompression works best in people who are in the earliest stages of avascular necrosis, before the collapse of the joint. This procedure often can reduce pain and slow the progression of bone and joint destruction in these patients. This surgical procedure removes the inner layer of bone, which:  Reduces pressure within the bone.  Increases blood flow to the bone.  Allows more blood vessels to form.  Reduces pain.  Osteotomy. This surgical procedure re-shapes the bone to reduce stress on the affected area of the joint. There is a lengthy recovery period. The patient's activities are very limited for 3 to 12 months after an osteotomy. This procedure is most effective for younger patients with advanced avascular necrosis, and those with a large area of affected bone.  Bone Graft. A bone graft may be used to support a joint after core decompression. Bone grafting is surgery that transplants healthy bone from one part of the patient, such as the leg, to the diseased area. Sometimes the bone is taken with it's blood vessels which are attached to local blood vessels near the area of bone collapse. This is called a vascularized bone graft. There is a lengthy recovery period after a bone graft, usually from 6 to 12 months. This procedure is technically complex.  Arthroplasty. Arthroplasty is also known as total joint replacement. Total joint replacement is used in late-stage avascular necrosis, and when the joint is deformed. In this surgery, the diseased joint is replaced with artificial parts. It may be recommended for people who are not good candidates for  other treatments, such as patients who may not do well with repeated attempts to preserve the joint. Various types of replacements are available, and patients should discuss specific needs with their caregiver. New treatments being tried include:  The use of medications.  Electrical stimulation.  Combination therapies to increase the growth of new bone and blood vessels. Document Released: 06/04/2002 Document Revised: 03/06/2012 Document Reviewed: 02/20/2014 [facility] Patient Information 2015 [place], [place]. This information is not intended to replace advice given to you by your health care provider. Make sure you discuss any questions you have with your health care provider.  Contusion A contusion is a deep bruise. Contusions are the result of an injury that caused bleeding under the skin. The contusion may turn blue, purple, or yellow. Minor injuries will give you a painless contusion, but more severe contusions may stay painful and swollen for a few weeks.  CAUSES  A  contusion is usually caused by a blow, trauma, or direct force to an area of the body. SYMPTOMS   Swelling and redness of the injured area.  Bruising of the injured area.  Tenderness and soreness of the injured area.  Pain. DIAGNOSIS  The diagnosis can be made by taking a history and physical exam. An X-ray, CT scan, or MRI may be needed to determine if there were any associated injuries, such as fractures. TREATMENT  Specific treatment will depend on what area of the body was injured. In general, the best treatment for a contusion is resting, icing, elevating, and applying cold compresses to the injured area. Over-the-counter medicines may also be recommended for pain control. Ask your caregiver what the best treatment is for your contusion. HOME CARE INSTRUCTIONS   Put ice on the injured area.  Put ice in a plastic bag.  Place a towel between your skin and the bag.  Leave the ice on for 15-20 minutes, 3-4 times a  day, or as directed by your health care provider.  Only take over-the-counter or prescription medicines for pain, discomfort, or fever as directed by your caregiver. Your caregiver may recommend avoiding anti-inflammatory medicines (aspirin, ibuprofen, and naproxen) for 48 hours because these medicines may increase bruising.  Rest the injured area.  If possible, elevate the injured area to reduce swelling. SEEK IMMEDIATE MEDICAL CARE IF:   You have increased bruising or swelling.  You have pain that is getting worse.  Your swelling or pain is not relieved with medicines. MAKE SURE YOU:   Understand these instructions.  Will watch your condition.  Will get help right away if you are not doing well or get worse. Document Released: 09/22/2005 Document Revised: 12/18/2013 Document Reviewed: 10/18/2011 [facility] Patient Information 2015 [place], [place]. This information is not intended to replace advice given to you by your health care provider. Make sure you discuss any questions you have with your health care provider.
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
## 2014-08-22 NOTE — ED Notes (Signed)
Pt transported to CT ?
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
## 2014-08-23 NOTE — ED Provider Notes (Signed)
Medical screening examination/treatment/procedure(s) were performed by non-physician practitioner and as supervising physician I was immediately available for consultation/collaboration.   EKG Interpretation None        Elwin Mocha, MD 08/23/14 2241

## 2014-08-27 ENCOUNTER — Emergency Department (HOSPITAL_COMMUNITY)
Admission: EM | Admit: 2014-08-27 | Discharge: 2014-08-27 | Disposition: A | Discharge status: Home / Self Care | Payer: Self-pay | Attending: Emergency Medicine | Admitting: Emergency Medicine

## 2014-08-27 ENCOUNTER — Encounter (HOSPITAL_COMMUNITY): Payer: Self-pay | Admitting: Emergency Medicine

## 2014-08-27 DIAGNOSIS — Z791 Long term (current) use of non-steroidal anti-inflammatories (NSAID): Secondary | ICD-10-CM | POA: Insufficient documentation

## 2014-08-27 DIAGNOSIS — Z79899 Other long term (current) drug therapy: Secondary | ICD-10-CM | POA: Insufficient documentation

## 2014-08-27 DIAGNOSIS — M129 Arthropathy, unspecified: Secondary | ICD-10-CM | POA: Insufficient documentation

## 2014-08-27 DIAGNOSIS — S2242XS Multiple fractures of ribs, left side, sequela: Secondary | ICD-10-CM

## 2014-08-27 DIAGNOSIS — Z87828 Personal history of other (healed) physical injury and trauma: Secondary | ICD-10-CM | POA: Insufficient documentation

## 2014-08-27 DIAGNOSIS — I10 Essential (primary) hypertension: Secondary | ICD-10-CM | POA: Insufficient documentation

## 2014-08-27 DIAGNOSIS — Z760 Encounter for issue of repeat prescription: Secondary | ICD-10-CM | POA: Insufficient documentation

## 2014-08-27 DIAGNOSIS — T148XXA Other injury of unspecified body region, initial encounter: Secondary | ICD-10-CM | POA: Insufficient documentation

## 2014-08-27 DIAGNOSIS — S5010XA Contusion of unspecified forearm, initial encounter: Secondary | ICD-10-CM | POA: Insufficient documentation

## 2014-08-27 MED ORDER — NAPROXEN 500 MG PO TABS: 500.0000 mg | ORAL_TABLET | Freq: Two times a day (BID) | ORAL | Status: DC

## 2014-08-27 NOTE — ED Notes (Signed)
Pt reports running out of oxycodone medication today. States he takes medication for chronic right hip pain and was recently prescribed it for rib fractures. Pt denies any other complaint. Pt  AO x4, NAD.

## 2014-08-27 NOTE — Discharge Instructions (Signed)
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
## 2014-08-28 NOTE — ED Provider Notes (Signed)
Medical screening examination/treatment/procedure(s) were performed by non-physician practitioner and as supervising physician I was immediately available for consultation/collaboration.   EKG Interpretation None       Doug Sou, MD 08/28/14 4628

## 2014-09-13 ENCOUNTER — Emergency Department (HOSPITAL_COMMUNITY)
Admission: EM | Admit: 2014-09-13 | Discharge: 2014-09-13 | Disposition: A | Discharge status: Home / Self Care | Payer: Self-pay | Attending: Emergency Medicine | Admitting: Emergency Medicine

## 2014-09-13 ENCOUNTER — Encounter (HOSPITAL_COMMUNITY): Payer: Self-pay | Admitting: Emergency Medicine

## 2014-09-13 DIAGNOSIS — M25569 Pain in unspecified knee: Secondary | ICD-10-CM | POA: Insufficient documentation

## 2014-09-13 DIAGNOSIS — G8911 Acute pain due to trauma: Secondary | ICD-10-CM | POA: Insufficient documentation

## 2014-09-13 DIAGNOSIS — R079 Chest pain, unspecified: Secondary | ICD-10-CM | POA: Insufficient documentation

## 2014-09-13 DIAGNOSIS — M25551 Pain in right hip: Secondary | ICD-10-CM

## 2014-09-13 DIAGNOSIS — M25561 Pain in right knee: Secondary | ICD-10-CM

## 2014-09-13 DIAGNOSIS — R0781 Pleurodynia: Secondary | ICD-10-CM

## 2014-09-13 DIAGNOSIS — I10 Essential (primary) hypertension: Secondary | ICD-10-CM | POA: Insufficient documentation

## 2014-09-13 DIAGNOSIS — M25559 Pain in unspecified hip: Secondary | ICD-10-CM | POA: Insufficient documentation

## 2014-09-13 DIAGNOSIS — M129 Arthropathy, unspecified: Secondary | ICD-10-CM | POA: Insufficient documentation

## 2014-09-13 DIAGNOSIS — Z79899 Other long term (current) drug therapy: Secondary | ICD-10-CM | POA: Insufficient documentation

## 2014-09-13 DIAGNOSIS — Z760 Encounter for issue of repeat prescription: Secondary | ICD-10-CM | POA: Insufficient documentation

## 2014-09-13 DIAGNOSIS — Z791 Long term (current) use of non-steroidal anti-inflammatories (NSAID): Secondary | ICD-10-CM | POA: Insufficient documentation

## 2014-09-13 DIAGNOSIS — G8929 Other chronic pain: Secondary | ICD-10-CM

## 2014-09-13 MED ORDER — OXYCODONE-ACETAMINOPHEN 5-325 MG PO TABS: 1.0000 | ORAL_TABLET | Freq: Four times a day (QID) | ORAL | Status: DC | PRN

## 2014-09-13 NOTE — ED Notes (Signed)
Pt comfortable with discharge and follow up instructions. Pt declines wheelchair, escorted to waiting area by this RN. Prescriptions x1. 

## 2014-09-13 NOTE — ED Provider Notes (Signed)
CSN: [id number]     Arrival date & time 09/13/14  1844 History  This chart was scribed for non-physician practitioner, Santiago Glad, PA-C,working with Toy Cookey, MD, by Karle Plumber, ED Scribe. This patient was seen in room TR07C/TR07C and the patient's care was started at 8:07 PM.  Chief Complaint  Patient presents with  . Medication Refill   The history is provided by the patient. No language interpreter was used.   HPI Comments:  Sean Mccoy is a 53 y.o. male with h/o chronic hip pain and HTN who presents to the Emergency Department complaining of severe pain secondary to 3 rib fractures he was diagnosed with three weeks ago. Pt states he was initially prescribed Percocet that he states gave him significant relief of the pain. He returned five days later and was prescribed Naproxen that he states has not been helping. He also reports being diagnosed with avascular necrosis of the right hip and is trying to set up care with an orthopedist but states he currently has no insurance. He states that pain is the same as it normally is, but it has been worsening lately.  He also reports worsening knee pain from an injury he sustained approximately ten years ago. Pt denies fever, chills, nausea, vomiting, HA, SOB, rash or new numbness or weakness of the lower extremities. He denies any new trauma, injury or fall.  Past Medical History  Diagnosis Date  . Hypertension   . Arthritis   . Knee fracture   . Groin injury   . Ankle injury    History reviewed. No pertinent past surgical history. No family history on file. History  Substance Use Topics  . Smoking status: Never Smoker   . Smokeless tobacco: Never Used  . Alcohol Use: Yes     Comment: occ    Review of Systems  Constitutional: Negative for fever and chills.  Gastrointestinal: Negative for nausea and vomiting.  Musculoskeletal: Positive for arthralgias.  Skin: Negative for color change and wound.  Neurological: Negative  for weakness and numbness.    Allergies  Review of patient's allergies indicates no known allergies.  Home Medications   Prior to Admission medications   Medication Sig Start Date End Date Taking? Authorizing Provider  DULoxetine (CYMBALTA) 20 MG capsule Take 20 mg by mouth daily. For depression. 11/30/12   Verne Spurr, PA-C  hydrochlorothiazide (HYDRODIURIL) 25 MG tablet Take 25 mg by mouth every morning. For hypertension. 11/30/12   Verne Spurr, PA-C  naproxen (NAPROSYN) 500 MG tablet Take 1 tablet (500 mg total) by mouth 2 (two) times daily. 08/27/14   Fayrene Helper, PA-C  oxyCODONE-acetaminophen (PERCOCET/ROXICET) 5-325 MG per tablet Take 1-2 tablets by mouth every 6 (six) hours as needed for severe pain. 09/13/14   Zareena Willis, PA-C  pravastatin (PRAVACHOL) 20 MG tablet Take 20 mg by mouth every other day. For cholesterol. 11/30/12   Verne Spurr, PA-C  traZODone (DESYREL) 50 MG tablet Take 50 mg by mouth at bedtime as needed for sleep (may repeat times 1). 11/30/12   Verne Spurr, PA-C   Triage Vitals: BP 153/96  Pulse 90  Temp(Src) 98.4 F (36.9 C)  Resp 18  Ht 5\' 10"  (1.778 m)  Wt 171 lb (77.565 kg)  BMI 24.54 kg/m2  SpO2 98% Physical Exam  Nursing note and vitals reviewed. Constitutional: He is oriented to person, place, and time. He appears well-developed and well-nourished.  HENT:  Head: Normocephalic and atraumatic.  Eyes: EOM are normal.  Neck: Normal range  of motion.  Cardiovascular: Normal rate, regular rhythm and normal heart sounds.  Exam reveals no gallop and no friction rub.   No murmur heard. DP pulses 2+.  Pulmonary/Chest: Effort normal and breath sounds normal. No respiratory distress. He has no wheezes. He has no rales.  Musculoskeletal: Normal range of motion. He exhibits tenderness. He exhibits no edema.  Tenderness to palpation of left lower anterior ribs with no overlying erythema, edema or warmth. No overlying erythema, edema or warmth of right hip.  Full passive ROM of right hip. Active ROM limited due to pain. No overlying erythema, edema or warmth of right knee. Full ROM of right knee. Tenderness to palpation over tibial plateau of right knee.  Neurological: He is alert and oriented to person, place, and time. No sensory deficit.  Skin: Skin is warm and dry.  Psychiatric: He has a normal mood and affect. His behavior is normal.    ED Course  Procedures (including critical care time) DIAGNOSTIC STUDIES: Oxygen Saturation is 98% on RA, normal by my interpretation.   COORDINATION OF CARE: 8:15 PM- Will prescribe Percocet and refer to orthopedist and give pt resource guide. Pt verbalizes understanding and agrees to plan.  Medications - No data to display  Labs Review Labs Reviewed - No data to display  Imaging Review No results found.   EKG Interpretation None      MDM   Final diagnoses:  Chronic right hip pain  Right knee pain  Rib pain on left side   Patient presenting with a complaint of chronic pain of his right hip and right knee.  No signs of infection at this time.  Patient neurovascularly intact.  No new injury or trauma.  Therefore, do not feel that imaging is indicated at this time.  Patient also presenting with pain of his right lower ribs.  He was diagnosed with 3 rib fractures 3 weeks ago.  Patient denies SOB, fever, or chills.  VSS.  Feel that the patient is stable for discharge.  Patient discharged home with pain medication.  Return precautions given.  I personally performed the services described in this documentation, which was scribed in my presence. The recorded information has been reviewed and is accurate.    Santiago Glad, PA-C 09/13/14 2338

## 2014-09-13 NOTE — ED Notes (Signed)
Rt hip pain for one year  Getting worse.  He also has 3 rib fractures for 3 weeks.  He has been seen here for the same.  He was seen here once and was given percocet that helped and the second time he was seen he was given naproxen.  He reports that the naproxen is nit helping

## 2014-09-14 NOTE — ED Provider Notes (Signed)
Medical screening examination/treatment/procedure(s) were performed by non-physician practitioner and as supervising physician I was immediately available for consultation/collaboration.  Megan Docherty, MD 09/14/14 1020 

## 2014-09-26 ENCOUNTER — Encounter (HOSPITAL_COMMUNITY): Payer: Self-pay | Admitting: Emergency Medicine

## 2014-09-26 ENCOUNTER — Emergency Department (HOSPITAL_COMMUNITY)
Admission: EM | Admit: 2014-09-26 | Discharge: 2014-09-26 | Disposition: A | Discharge status: Home / Self Care | Payer: Self-pay | Attending: Emergency Medicine | Admitting: Emergency Medicine

## 2014-09-26 DIAGNOSIS — Z791 Long term (current) use of non-steroidal anti-inflammatories (NSAID): Secondary | ICD-10-CM | POA: Insufficient documentation

## 2014-09-26 DIAGNOSIS — Z79899 Other long term (current) drug therapy: Secondary | ICD-10-CM | POA: Insufficient documentation

## 2014-09-26 DIAGNOSIS — Z87828 Personal history of other (healed) physical injury and trauma: Secondary | ICD-10-CM | POA: Insufficient documentation

## 2014-09-26 DIAGNOSIS — G8929 Other chronic pain: Secondary | ICD-10-CM | POA: Insufficient documentation

## 2014-09-26 DIAGNOSIS — I10 Essential (primary) hypertension: Secondary | ICD-10-CM | POA: Insufficient documentation

## 2014-09-26 DIAGNOSIS — M549 Dorsalgia, unspecified: Secondary | ICD-10-CM | POA: Insufficient documentation

## 2014-09-26 DIAGNOSIS — Z8781 Personal history of (healed) traumatic fracture: Secondary | ICD-10-CM | POA: Insufficient documentation

## 2014-09-26 DIAGNOSIS — M1990 Unspecified osteoarthritis, unspecified site: Secondary | ICD-10-CM | POA: Insufficient documentation

## 2014-09-26 MED ORDER — KETOROLAC TROMETHAMINE 10 MG PO TABS: 10.0000 mg | ORAL_TABLET | Freq: Four times a day (QID) | ORAL | Status: DC | PRN

## 2014-09-26 NOTE — ED Provider Notes (Signed)
CSN: [id number]     Arrival date & time 09/26/14  1842 History   First MD Initiated Contact with Patient 09/26/14 1854     This chart was scribed for non-physician practitioner, Mayme Genta PA-C working with Gilda Crease, * by Arlan Organ, ED Scribe. This patient was seen in room TR11C/TR11C and the patient's care was started at 8:39 PM.   Chief Complaint  Patient presents with  . Medication Refill   The history is provided by the patient. No language interpreter was used.    HPI Comments: Sean Mccoy is a 53 y.o. male with a PMHx of HTN and arthritis who presents to the Emergency Department here for a pain medication refill today. Pt was recently diagnosed with avascular necrosis to the R hip. He is not currently followed by a PCP due to insurance issues, however, he plans to establish in the near future. [name] reports constant, moderate pain to the R hip, back pain, and R knee that is chronic in nature. He denies any recent injury or trauma.   Past Medical History  Diagnosis Date  . Hypertension   . Arthritis   . Knee fracture   . Groin injury   . Ankle injury    History reviewed. No pertinent past surgical history. No family history on file. History  Substance Use Topics  . Smoking status: Never Smoker   . Smokeless tobacco: Never Used  . Alcohol Use: Yes     Comment: occ    Review of Systems  Musculoskeletal: Positive for arthralgias and back pain.  All other systems reviewed and are negative.     Allergies  Review of patient's allergies indicates no known allergies.  Home Medications   Prior to Admission medications   Medication Sig Start Date End Date Taking? Authorizing Provider  Ascorbic Acid (VITAMIN C PO) Take 1 tablet by mouth daily.   Yes Historical Provider, MD  DULoxetine (CYMBALTA) 20 MG capsule Take 20 mg by mouth daily. For depression. 11/30/12  Yes Verne Spurr, PA-C  hydrochlorothiazide (HYDRODIURIL) 25 MG tablet Take 25 mg by mouth  every morning. For hypertension. 11/30/12  Yes Verne Spurr, PA-C  naproxen (NAPROSYN) 500 MG tablet Take 1 tablet (500 mg total) by mouth 2 (two) times daily. 08/27/14  Yes Fayrene Helper, PA-C  oxyCODONE-acetaminophen (PERCOCET/ROXICET) 5-325 MG per tablet Take 1-2 tablets by mouth every 6 (six) hours as needed for severe pain. 09/13/14  Yes Heather Laisure, PA-C  pravastatin (PRAVACHOL) 20 MG tablet Take 20 mg by mouth every other day. For cholesterol. 11/30/12  Yes Verne Spurr, PA-C  traZODone (DESYREL) 50 MG tablet Take 50 mg by mouth at bedtime as needed for sleep (may repeat times 1). 11/30/12  Yes Verne Spurr, PA-C  ketorolac (TORADOL) 10 MG tablet Take 1 tablet (10 mg total) by mouth every 6 (six) hours as needed. 09/26/14   Sharlene Motts, PA-C   Triage Vitals: BP 154/96  Temp(Src) 97.6 F (36.4 C) (Oral)  Resp 18  SpO2 99%   Physical Exam  Nursing note and vitals reviewed. Constitutional: He appears well-developed and well-nourished. No distress.  HENT:  Head: Normocephalic and atraumatic.  Neck: Neck supple.  Cardiovascular: Normal rate, regular rhythm and normal heart sounds.   Pulmonary/Chest: Effort normal and breath sounds normal. No respiratory distress.  Abdominal: Soft. He exhibits no distension.  Musculoskeletal:  Patient ambulates independently and has appropriate range of motion of all 4 extremities  Neurological: He is alert.  Skin: He  is not diaphoretic.    ED Course  Procedures (including critical care time)  DIAGNOSTIC STUDIES: Oxygen Saturation is 99% on RA, Normal by my interpretation.    COORDINATION OF CARE: 8:39 PM-Discussed treatment plan with pt at bedside and pt agreed to plan.     Labs Review Labs Reviewed - No data to display  Imaging Review No results found.   EKG Interpretation None      MDM  Vitals stable - WNL -afebrile Pt resting comfortably in ED, watching football game PE not consistent with any acute or emergent pathology at  this time. Patient reports no new injuries or pain. Discussed will not prescribe narcotic pain medicine for chronic pain management. Offered NSAID therapy and he was receptive. States he will pursue that pushing care at primary care tomorrow.  Will DC with Toradol for pain management Discussed f/u with PCP and return precautions, pt very amenable to plan. Vision is stable, in good condition and is appropriate for discharge   Final diagnoses:  Chronic pain     I personally performed the services described in this documentation, which was scribed in my presence. The recorded information has been reviewed and is accurate.    Sharlene Motts, PA-C 09/26/14 2041

## 2014-09-26 NOTE — ED Notes (Signed)
Pt reports he's waiting for hip replacement. Pt is tearful reports that he has more pain some days versus others and needs a refill on Percocet's.

## 2014-09-26 NOTE — ED Notes (Signed)
Declined W/C at D/C and was escorted to lobby by RN. 

## 2014-09-26 NOTE — ED Notes (Signed)
Pt here for pain prescription refill. Reports chronic generalized pain. Pt in NAD.

## 2014-09-27 NOTE — ED Provider Notes (Signed)
Medical screening examination/treatment/procedure(s) were performed by non-physician practitioner and as supervising physician I was immediately available for consultation/collaboration.  Cynthea Zachman J. Amedee Cerrone, MD 09/27/14 0043 

## 2014-10-19 ENCOUNTER — Encounter (HOSPITAL_COMMUNITY): Payer: Self-pay | Admitting: Emergency Medicine

## 2014-10-19 ENCOUNTER — Emergency Department (HOSPITAL_COMMUNITY)
Admission: EM | Admit: 2014-10-19 | Discharge: 2014-10-20 | Disposition: A | Discharge status: Home / Self Care | Payer: Self-pay | Attending: Emergency Medicine | Admitting: Emergency Medicine

## 2014-10-19 DIAGNOSIS — M25551 Pain in right hip: Secondary | ICD-10-CM | POA: Insufficient documentation

## 2014-10-19 DIAGNOSIS — Z87828 Personal history of other (healed) physical injury and trauma: Secondary | ICD-10-CM | POA: Insufficient documentation

## 2014-10-19 DIAGNOSIS — Z79899 Other long term (current) drug therapy: Secondary | ICD-10-CM | POA: Insufficient documentation

## 2014-10-19 DIAGNOSIS — M1990 Unspecified osteoarthritis, unspecified site: Secondary | ICD-10-CM | POA: Insufficient documentation

## 2014-10-19 DIAGNOSIS — G8929 Other chronic pain: Secondary | ICD-10-CM | POA: Insufficient documentation

## 2014-10-19 DIAGNOSIS — I10 Essential (primary) hypertension: Secondary | ICD-10-CM | POA: Insufficient documentation

## 2014-10-19 DIAGNOSIS — M545 Low back pain, unspecified: Secondary | ICD-10-CM

## 2014-10-19 DIAGNOSIS — Z791 Long term (current) use of non-steroidal anti-inflammatories (NSAID): Secondary | ICD-10-CM | POA: Insufficient documentation

## 2014-10-19 DIAGNOSIS — Z8781 Personal history of (healed) traumatic fracture: Secondary | ICD-10-CM | POA: Insufficient documentation

## 2014-10-19 MED ORDER — OXYCODONE-ACETAMINOPHEN 5-325 MG PO TABS
2.0000 | ORAL_TABLET | Freq: Once | ORAL | Status: AC
  Administered 2014-10-20: 2 via ORAL
  Filled 2014-10-19: qty 2

## 2014-10-19 NOTE — ED Notes (Signed)
Ran out of pain meds. C/o chronic R hip pain for many months. Seen here for the same previously. Has no PCP. "needs surgery for bone on bone contact". Here for pain management. Rates pain 10/10. No new injury. "need pain meds".

## 2014-10-20 NOTE — ED Provider Notes (Signed)
  Medical screening examination/treatment/procedure(s) were performed by non-physician practitioner and as supervising physician I was immediately available for consultation/collaboration.   EKG Interpretation None         Gerhard Munch, MD 10/20/14 [phone]

## 2014-10-20 NOTE — ED Provider Notes (Signed)
CSN: [id number]     Arrival date & time 10/19/14  2253 History   First MD Initiated Contact with Patient 10/19/14 2332     Chief Complaint  Patient presents with  . Hip Pain     (Consider location/radiation/quality/duration/timing/severity/associated sxs/prior Treatment) HPI Sean Mccoy is a 53 y.o. male with PMH of HTN, chronic R hip pain presenting with sharp hip pain that is chronic with acute worsening after patient was walking and states that his leg gave out. Patient denies fall or new injury to hip. Patient states pain is 10/10. Patient does not have a PCP but is waiting for his insurance to kick in. Patient referred to orthopedist but hasn't seen because he doesn't have insurance. Patient also with sharp right sided back pain without acute worsening. Patient denies pain radiation, numbness or tingling but endorses weakness. No fevers, chills, night sweats, weight loss, IVDU, history of malignancy. No loss of control of bladder or bowel. No saddle anesthesia.     Past Medical History  Diagnosis Date  . Hypertension   . Arthritis   . Knee fracture   . Groin injury   . Ankle injury    History reviewed. No pertinent past surgical history. No family history on file. History  Substance Use Topics  . Smoking status: Never Smoker   . Smokeless tobacco: Never Used  . Alcohol Use: Yes     Comment: occ    Review of Systems  Musculoskeletal: Negative for joint swelling.  Skin: Negative for wound.  Neurological: Positive for weakness. Negative for numbness.      Allergies  Review of patient's allergies indicates no known allergies.  Home Medications   Prior to Admission medications   Medication Sig Start Date End Date Taking? Authorizing Provider  Ascorbic Acid (VITAMIN C PO) Take 1 tablet by mouth daily.    Historical Provider, MD  DULoxetine (CYMBALTA) 20 MG capsule Take 20 mg by mouth daily. For depression. 11/30/12   Verne Spurr, PA-C  hydrochlorothiazide  (HYDRODIURIL) 25 MG tablet Take 25 mg by mouth every morning. For hypertension. 11/30/12   Verne Spurr, PA-C  ketorolac (TORADOL) 10 MG tablet Take 1 tablet (10 mg total) by mouth every 6 (six) hours as needed. 09/26/14   Earle Gell Cartner, PA-C  naproxen (NAPROSYN) 500 MG tablet Take 1 tablet (500 mg total) by mouth 2 (two) times daily. 08/27/14   Fayrene Helper, PA-C  oxyCODONE-acetaminophen (PERCOCET/ROXICET) 5-325 MG per tablet Take 1-2 tablets by mouth every 6 (six) hours as needed for severe pain. 09/13/14   Heather Laisure, PA-C  pravastatin (PRAVACHOL) 20 MG tablet Take 20 mg by mouth every other day. For cholesterol. 11/30/12   Verne Spurr, PA-C  traZODone (DESYREL) 50 MG tablet Take 50 mg by mouth at bedtime as needed for sleep (may repeat times 1). 11/30/12   Verne Spurr, PA-C   BP 132/77  Pulse 102  Temp(Src) 97.8 F (36.6 C) (Oral)  Resp 20  Wt 170 lb (77.111 kg)  SpO2 100% Physical Exam  Nursing note and vitals reviewed. Constitutional: He appears well-developed and well-nourished. No distress.  HENT:  Head: Normocephalic and atraumatic.  Eyes: Conjunctivae are normal. Right eye exhibits no discharge. Left eye exhibits no discharge.  Cardiovascular: Normal rate, regular rhythm and normal heart sounds.   Pulmonary/Chest: Effort normal and breath sounds normal. No respiratory distress. He has no wheezes.  Abdominal: Soft. Bowel sounds are normal. He exhibits no distension. There is no tenderness.  Musculoskeletal:  Anterior hip  tenderness with FROM. No deformity noted. No midline back tenderness, step off or crepitus. Right sided lower back tenderness. No CVA tenderness.   Neurological: He is alert. Coordination normal.  Equal muscle tone. 5/5 strength in lower extremities. DTR equal and intact. Negative straight leg test. Antalgic gait.   Skin: Skin is warm and dry. He is not diaphoretic.    ED Course  Procedures (including critical care time) Labs Review Labs Reviewed -  No data to display  Imaging Review No results found.   EKG Interpretation None      MDM   Final diagnoses:  Right-sided low back pain without sciatica  Right hip pain   Patient with chronic hip and back pain. No loss of bowel or bladder control. No saddle anesthesia. No fever, night sweats, weight loss, h/o cancer, IVDU. VSS. No neurological deficits and normal neuro exam. Patient can walk but states is painful. No concern for cauda equina. Pain medication in ED. RICE protocol at home. Discussed the chronic nature and that outpatient pain medications not indicated.  Patient is afebrile, nontoxic, and in no acute distress. Patient is appropriate for outpatient management and is stable for discharge.  Discussed return precautions with patient. Discussed all results and patient verbalizes understanding and agrees with plan.      Louann Sjogren, PA-C 10/20/14 [phone]

## 2014-10-20 NOTE — Discharge Instructions (Signed)
Return to the emergency room with worsening of symptoms, new symptoms or with symptoms that are concerning , especially fevers, loss of control of bladder or bowels, numbness or tingling around genital region or anus, weakness. RICE: Rest, Ice (three cycles of 20 mins on, off at least twice a day), compression/brace, elevation. Heating pad works well for back pain. Ibuprofen 400mg  (2 tablets 200mg ) every 5-6 hours for 3-5 days and then as needed for pain. Call to make appointment at the wellness center for follow up.

## 2014-10-29 ENCOUNTER — Emergency Department (HOSPITAL_COMMUNITY)
Admission: EM | Admit: 2014-10-29 | Discharge: 2014-10-29 | Disposition: A | Discharge status: Home / Self Care | Payer: Medicaid Other | Attending: Emergency Medicine | Admitting: Emergency Medicine

## 2014-10-29 ENCOUNTER — Encounter (HOSPITAL_COMMUNITY): Payer: Self-pay | Admitting: Physical Medicine and Rehabilitation

## 2014-10-29 DIAGNOSIS — Z8739 Personal history of other diseases of the musculoskeletal system and connective tissue: Secondary | ICD-10-CM | POA: Insufficient documentation

## 2014-10-29 DIAGNOSIS — Z79899 Other long term (current) drug therapy: Secondary | ICD-10-CM | POA: Diagnosis not present

## 2014-10-29 DIAGNOSIS — Z87828 Personal history of other (healed) physical injury and trauma: Secondary | ICD-10-CM | POA: Insufficient documentation

## 2014-10-29 DIAGNOSIS — M25551 Pain in right hip: Secondary | ICD-10-CM | POA: Diagnosis not present

## 2014-10-29 DIAGNOSIS — I10 Essential (primary) hypertension: Secondary | ICD-10-CM | POA: Diagnosis not present

## 2014-10-29 DIAGNOSIS — Z791 Long term (current) use of non-steroidal anti-inflammatories (NSAID): Secondary | ICD-10-CM | POA: Insufficient documentation

## 2014-10-29 DIAGNOSIS — G8929 Other chronic pain: Secondary | ICD-10-CM | POA: Diagnosis not present

## 2014-10-29 MED ORDER — OXYCODONE-ACETAMINOPHEN 5-325 MG PO TABS
1.0000 | ORAL_TABLET | Freq: Once | ORAL | Status: AC
  Administered 2014-10-29: 1 via ORAL
  Filled 2014-10-29: qty 1

## 2014-10-29 MED ORDER — OXYCODONE-ACETAMINOPHEN 5-325 MG PO TABS: 1.0000 | ORAL_TABLET | ORAL | Status: DC | PRN

## 2014-10-29 NOTE — Discharge Instructions (Signed)

## 2014-10-29 NOTE — ED Notes (Signed)
Pt presents to department for evaluation of R hip pain. States chronic issues with pain. Pt requesting prescription for pain medication, states he has been taking Percocet. Pt is alert and oriented x4. NAD.

## 2014-10-29 NOTE — ED Notes (Addendum)
Pt c/o bilateral hip pain, worse on right hip pain; reports chronic pain in which he is out of Percocet prescription. Requesting pain medication prescription.

## 2014-10-29 NOTE — ED Provider Notes (Signed)
CSN: [id number]     Arrival date & time 10/29/14  1631 History  This chart was scribed for non-physician practitioner, Elpidio Anis, PA-C, working with Lyanne Co, MD, by Bronson Curb, ED Scribe. This patient was seen in room TR07C/TR07C and the patient's care was started at 6:07 PM.     Chief Complaint  Patient presents with  . Hip Pain    Patient is a 53 y.o. male presenting with hip pain. The history is provided by the patient. No language interpreter was used.  Hip Pain Pertinent negatives include no abdominal pain.    HPI Comments: Sean Mccoy is a 53 y.o. male who presents to the Emergency Department complaining of an exacerabted episode of his chronic right hip pain. Patient stats he was diagnosed with avascular necrosis 2 months ago that is requiring hip surgery. Patient was referred to orthopedic specialist, but does not have insurance at this time. Patient is not established with a PCP, but is in the process of becoming established at the [facility]. He was prescribed Percocet and Naproxen for pain relief, however he reports that he is currently out of Percocet. He states that the pain is worse is when ambulating. He denies any abdominal pain. Patient is employed part-time.   Past Medical History  Diagnosis Date  . Hypertension   . Arthritis   . Knee fracture   . Groin injury   . Ankle injury    History reviewed. No pertinent past surgical history. History reviewed. No pertinent family history. History  Substance Use Topics  . Smoking status: Never Smoker   . Smokeless tobacco: Never Used  . Alcohol Use: Yes     Comment: occ    Review of Systems  Constitutional: Negative for fever.  Gastrointestinal: Negative for abdominal pain.  Musculoskeletal: Positive for arthralgias (right hip). Negative for joint swelling.      Allergies  Review of patient's allergies indicates no known allergies.  Home Medications   Prior to Admission medications    Medication Sig Start Date End Date Taking? Authorizing Provider  Ascorbic Acid (VITAMIN C PO) Take 1 tablet by mouth daily.    Historical Provider, MD  DULoxetine (CYMBALTA) 20 MG capsule Take 20 mg by mouth daily. For depression. 11/30/12   Verne Spurr, PA-C  hydrochlorothiazide (HYDRODIURIL) 25 MG tablet Take 25 mg by mouth every morning. For hypertension. 11/30/12   Verne Spurr, PA-C  ketorolac (TORADOL) 10 MG tablet Take 1 tablet (10 mg total) by mouth every 6 (six) hours as needed. 09/26/14   Earle Gell Cartner, PA-C  naproxen (NAPROSYN) 500 MG tablet Take 1 tablet (500 mg total) by mouth 2 (two) times daily. 08/27/14   Fayrene Helper, PA-C  oxyCODONE-acetaminophen (PERCOCET/ROXICET) 5-325 MG per tablet Take 1-2 tablets by mouth every 6 (six) hours as needed for severe pain. 09/13/14   Heather Laisure, PA-C  pravastatin (PRAVACHOL) 20 MG tablet Take 20 mg by mouth every other day. For cholesterol. 11/30/12   Verne Spurr, PA-C  traZODone (DESYREL) 50 MG tablet Take 50 mg by mouth at bedtime as needed for sleep (may repeat times 1). 11/30/12   Verne Spurr, PA-C   Triage Vitals: BP 110/60 mmHg  Pulse 99  Temp(Src) 98.3 F (36.8 C) (Oral)  Resp 18  Ht 5\' 10"  (1.778 m)  Wt 170 lb (77.111 kg)  BMI 24.39 kg/m2  SpO2 97%  Physical Exam  Constitutional: He is oriented to person, place, and time. He appears well-developed and well-nourished. No distress.  HENT:  Head: Normocephalic and atraumatic.  Eyes: Conjunctivae and EOM are normal.  Neck: No tracheal deviation present.  Cardiovascular: Normal rate and intact distal pulses.   Pulmonary/Chest: Effort normal. No respiratory distress.  Abdominal: There is no tenderness.  No abdominal pain.  Musculoskeletal: Normal range of motion. He exhibits tenderness. He exhibits no edema.  Palpable right hip tenderness without swelling.  Neurological: He is alert and oriented to person, place, and time. He displays normal reflexes.  Equal reflexes and  equal strength in lower extremities.  Skin: Skin is warm and dry.  Psychiatric: He has a normal mood and affect. His behavior is normal.  Nursing note and vitals reviewed.   ED Course  Procedures (including critical care time)  DIAGNOSTIC STUDIES: Oxygen Saturation is 97% on room air, adequate by my interpretation.    COORDINATION OF CARE: At 1812 Discussed treatment plan with patient which includes pain medication. Patient agrees.   Labs Review Labs Reviewed - No data to display  Imaging Review No results found.   EKG Interpretation None      MDM   Final diagnoses:  None    1. Chronic right hip pain 2. H/O avascular necrosis right hip  No new injury, no worse pain. He reports out of pain medication, pending follow up appointment with [facility] for further outpatient management.  I personally performed the services described in this documentation, which was scribed in my presence. The recorded information has been reviewed and is accurate.     Arnoldo Hooker, PA-C 10/30/14 0158  Lyanne Co, MD 10/30/14 [phone]

## 2014-11-07 ENCOUNTER — Encounter: Payer: Self-pay | Admitting: Internal Medicine

## 2014-11-08 ENCOUNTER — Ambulatory Visit: Payer: Medicaid Other | Attending: Internal Medicine | Admitting: Internal Medicine

## 2014-11-08 ENCOUNTER — Encounter: Payer: Self-pay | Admitting: Internal Medicine

## 2014-11-08 VITALS — BP 134/85 | HR 108 | Temp 98.1°F | Resp 16 | Ht 70.0 in | Wt 161.0 lb

## 2014-11-08 DIAGNOSIS — M25561 Pain in right knee: Secondary | ICD-10-CM | POA: Diagnosis not present

## 2014-11-08 DIAGNOSIS — M25551 Pain in right hip: Secondary | ICD-10-CM | POA: Diagnosis not present

## 2014-11-08 DIAGNOSIS — Z791 Long term (current) use of non-steroidal anti-inflammatories (NSAID): Secondary | ICD-10-CM | POA: Insufficient documentation

## 2014-11-08 DIAGNOSIS — I10 Essential (primary) hypertension: Secondary | ICD-10-CM | POA: Diagnosis not present

## 2014-11-08 DIAGNOSIS — G8929 Other chronic pain: Secondary | ICD-10-CM | POA: Diagnosis not present

## 2014-11-08 MED ORDER — TRAMADOL HCL 50 MG PO TABS: 50.0000 mg | ORAL_TABLET | Freq: Three times a day (TID) | ORAL | Status: DC | PRN

## 2014-11-08 NOTE — Patient Instructions (Signed)
Hip Pain Your hip is the joint between your upper legs and your lower pelvis. The bones, cartilage, tendons, and muscles of your hip joint perform a lot of work each day supporting your body weight and allowing you to move around. Hip pain can range from a minor ache to severe pain in one or both of your hips. Pain may be felt on the inside of the hip joint near the groin, or the outside near the buttocks and upper thigh. You may have swelling or stiffness as well.  HOME CARE INSTRUCTIONS   Take medicines only as directed by your health care provider.  Apply ice to the injured area:  Put ice in a plastic bag.  Place a towel between your skin and the bag.  Leave the ice on for 15-20 minutes at a time, 3-4 times a day.  Keep your leg raised (elevated) when possible to lessen swelling.  Avoid activities that cause pain.  Follow specific exercises as directed by your health care provider.  Sleep with a pillow between your legs on your most comfortable side.  Record how often you have hip pain, the location of the pain, and what it feels like. SEEK MEDICAL CARE IF:   You are unable to put weight on your leg.  Your hip is red or swollen or very tender to touch.  Your pain or swelling continues or worsens after 1 week.  You have increasing difficulty walking.  You have a fever. SEEK IMMEDIATE MEDICAL CARE IF:   You have fallen.  You have a sudden increase in pain and swelling in your hip. MAKE SURE YOU:   Understand these instructions.  Will watch your condition.  Will get help right away if you are not doing well or get worse. Document Released: 06/02/2010 Document Revised: 04/29/2014 Document Reviewed: 08/09/2013 [employer] Patient Information 2015 [employer]. This information is not intended to replace advice given to you by your health care provider. Make sure you discuss any questions you have with your health care provider.  

## 2014-11-08 NOTE — Progress Notes (Signed)
Pt is here today wanting to address his chronic pain in his right hip, knee and right leg. Pt had an accident where his knee was crushed 10 years ago. Pt is wanting an ortho referral.

## 2014-11-08 NOTE — Progress Notes (Signed)
Patient ID: Sean Mccoy, male   DOB: Oct 31, 1961, 53 y.o.   MRN: [id number]  CC: chronic pain, ortho referral  HPI:  Patient presents to clinic today for a follow up of chronic right hip and knee pain.  Review of the chart reveals that patient has been to the ED six times in the last two months for pain medication refills.  He was given Naproxen, Percocets, and toradol for pain which he states does not help his pain much.  He reports that was diagnosed with vascular necrosis and a right knee crush injury in the past but has been unable to establish care with Orthopedics due to his lack of insurance.  He states that he has been approved for disability due to his chronic pain.  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 The pain is described as a aching that is aggravated by ambulation.    No Known Allergies Past Medical History  Diagnosis Date  . Hypertension   . Arthritis   . Knee fracture   . Groin injury   . Ankle injury    Current Outpatient Prescriptions on File Prior to Visit  Medication Sig Dispense Refill  . Ascorbic Acid (VITAMIN C PO) Take 1 tablet by mouth daily.    . DULoxetine (CYMBALTA) 20 MG capsule Take 20 mg by mouth daily. For depression.    . hydrochlorothiazide (HYDRODIURIL) 25 MG tablet Take 25 mg by mouth every morning. For hypertension.    Marland Kitchen ketorolac (TORADOL) 10 MG tablet Take 1 tablet (10 mg total) by mouth every 6 (six) hours as needed. 20 tablet 0  . naproxen (NAPROSYN) 500 MG tablet Take 1 tablet (500 mg total) by mouth 2 (two) times daily. 30 tablet 0  . oxyCODONE-acetaminophen (PERCOCET/ROXICET) 5-325 MG per tablet Take 1-2 tablets by mouth every 4 (four) hours as needed for severe pain. 15 tablet 0  . pravastatin (PRAVACHOL) 20 MG tablet Take 20 mg by mouth every other day. For cholesterol.    . traZODone (DESYREL) 50 MG tablet Take 50 mg by mouth at bedtime as needed for sleep (may  repeat times 1).     No current facility-administered medications on file prior to visit.   History reviewed. No pertinent family history. History   Social History  . Marital Status: Single    Spouse Name: N/A    Number of Children: N/A  . Years of Education: N/A   Occupational History  . Not on file.   Social History Main Topics  . Smoking status: Never Smoker   . Smokeless tobacco: Never Used  . Alcohol Use: Yes     Comment: occ  . Drug Use: No  . Sexual Activity: Yes    Birth Control/ Protection: Condom   Other Topics Concern  . Not on file   Social History Narrative    Review of Systems  Musculoskeletal: Positive for joint pain.  All other systems reviewed and are negative.     Objective:   Filed Vitals:   11/08/14 1528  BP: 134/85  Pulse: 108  Temp: 98.1 F (36.7 C)  Resp: 16    Physical Exam  Neck: Normal range of motion.  Cardiovascular: Normal rate, regular rhythm and normal heart sounds.   Pulmonary/Chest: Effort normal and breath sounds normal.  Abdominal: Soft. Bowel sounds are normal.  Musculoskeletal: He exhibits no edema or tenderness.  Limited passive and active  ROM of right hip   '  Lab Results  Component Value Date   WBC 4.0 11/27/2012   HGB 13.3 08/22/2014   HCT 39.0 08/22/2014   MCV 92.9 11/27/2012   PLT 197 11/27/2012   Lab Results  Component Value Date   CREATININE 0.90 08/22/2014   BUN 5* 08/22/2014   NA 134* 08/22/2014   K 3.9 08/22/2014   CL 101 08/22/2014   CO2 21 11/27/2012    No results found for: HGBA1C Lipid Panel  No results found for: CHOL, TRIG, HDL, CHOLHDL, VLDL, LDLCALC     Assessment and plan:   Khameron was seen today for follow-up.  Diagnoses and associated orders for this visit:  Chronic hip pain, right - Ambulatory referral to Sports Medicine - traMADol (ULTRAM) 50 MG tablet; Take 1 tablet (50 mg total) by mouth every 8 (eight) hours as needed.   Return if symptoms worsen or fail to  improve.       Holland Commons, NP-C [facility] [phone] 11/08/2014, 3:45 PM

## 2014-11-19 ENCOUNTER — Ambulatory Visit (HOSPITAL_COMMUNITY)
Admission: RE | Admit: 2014-11-19 | Discharge: 2014-11-19 | Disposition: A | Discharge status: Home / Self Care | Payer: Medicaid Other | Source: Ambulatory Visit | Attending: Sports Medicine | Admitting: Sports Medicine

## 2014-11-19 ENCOUNTER — Ambulatory Visit (INDEPENDENT_AMBULATORY_CARE_PROVIDER_SITE_OTHER): Payer: Self-pay | Admitting: Sports Medicine

## 2014-11-19 ENCOUNTER — Encounter: Payer: Self-pay | Admitting: Sports Medicine

## 2014-11-19 VITALS — BP 137/81 | HR 85 | Ht 70.0 in | Wt 165.0 lb

## 2014-11-19 DIAGNOSIS — M25561 Pain in right knee: Secondary | ICD-10-CM | POA: Diagnosis present

## 2014-11-19 DIAGNOSIS — M179 Osteoarthritis of knee, unspecified: Secondary | ICD-10-CM | POA: Insufficient documentation

## 2014-11-19 DIAGNOSIS — M1611 Unilateral primary osteoarthritis, right hip: Secondary | ICD-10-CM | POA: Diagnosis not present

## 2014-11-19 DIAGNOSIS — G8929 Other chronic pain: Secondary | ICD-10-CM

## 2014-11-19 DIAGNOSIS — M25551 Pain in right hip: Secondary | ICD-10-CM | POA: Diagnosis present

## 2014-11-19 MED ORDER — TRAMADOL HCL 50 MG PO TABS: 50.0000 mg | ORAL_TABLET | Freq: Two times a day (BID) | ORAL | Status: DC | PRN

## 2014-11-19 MED ORDER — METHYLPREDNISOLONE ACETATE 40 MG/ML IJ SUSP
40.0000 mg | Freq: Once | INTRAMUSCULAR | Status: AC
  Administered 2014-11-19: 40 mg via INTRA_ARTICULAR

## 2014-11-19 NOTE — Progress Notes (Signed)
   Subjective:    Patient ID: Sean Mccoy, male    DOB: 05-24-1961, 53 y.o.   MRN: [id number]  HPI chief complaint: Right hip and right knee pain  Very pleasant 53 year old male comes in today complaining of right hip and right knee pain. Pain in the right hip is been present for about a year. No known trauma. 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 His hip pain is diffuse including into the groin. Radiating pain down the leg into his knee. He also is complaining of worsening knee pain. Has a history of a crush injury to this knee 10 years ago and ever since then the knee has been painful. No surgery was performed. Pain is diffuse in the knee. He has not noticed any swelling. Pain in the hip and in the knee are worse with weightbearing. He has been given crutches in the past but comes in today without the assistance of any sort of walking device. He has been treated in the past with naproxen sodium as well as tramadol. Tramadol seems to help and he is requesting a refill. No prior surgeries on either his hip or his knee. Past medical history reviewed Medications reviewed Allergies reviewed Social history reviewed. Social history is significant for alcohol abuse  Review of Systems     Objective:   Physical Exam Well-developed, well-nourished. No acute distress. Sitting comfortably in the exam room  Right hip: Patient has fairly good passive internal and external rotation of the hip but this does reproduce groin pain. He has pain and weakness with resisted hip abduction. Some tenderness over the greater trochanter as well.  Left hip: Smooth painless hip range of motion.  Right knee: Range of motion is 0-100. Trace effusion. Diffuse tenderness to palpation. Knee is grossly stable to ligamentous exam. No erythema. Joint is not warm to touch. Neurovascular intact distally. Walking with a significant limp.  CT scan of his abdomen and pelvis was performed on  08/22/2014. He does have findings consistent with avascular necrosis of the superior aspect of the right femoral head. There also appears to be joint space narrowing bilaterally of each hips.       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 This is done atraumatically under sterile technique utilizing an anterior lateral approach. Patient tolerated this without difficulty. I would like to get dedicated x-rays of his right hip as well as an AP of his pelvis. I will also get AP, lateral, and sunrise views of his right knee. I've agreed to give him a few more tramadol but further refills will need to come from his PCP. Patient will follow-up with me in 2 weeks to discuss x-ray findings. We did discuss the possibility of an intra-articular right hip injection. I also educated him on the proper use of a single crutch to help assist him with ambulation (he has a set of crutches at home).  Consent obtained and verified. Time-out conducted. Noted no overlying erythema, induration, or other signs of local infection. Skin prepped in a sterile fashion. Topical analgesic spray: Ethyl chloride. Joint: right knee, anterior lateral approach Needle: 22g 1.5 inch Completed without difficulty. Meds: 3cc 1% xylocaine, 1cc (40mg ) depomedrol  Advised to call if fevers/chills, erythema, induration, drainage, or persistent bleeding.

## 2014-11-25 ENCOUNTER — Encounter (HOSPITAL_COMMUNITY): Payer: Self-pay | Admitting: Emergency Medicine

## 2014-11-25 ENCOUNTER — Emergency Department (HOSPITAL_COMMUNITY)
Admission: EM | Admit: 2014-11-25 | Discharge: 2014-11-25 | Disposition: A | Discharge status: Home / Self Care | Payer: Medicaid Other | Attending: Emergency Medicine | Admitting: Emergency Medicine

## 2014-11-25 DIAGNOSIS — Z79899 Other long term (current) drug therapy: Secondary | ICD-10-CM | POA: Insufficient documentation

## 2014-11-25 DIAGNOSIS — Z791 Long term (current) use of non-steroidal anti-inflammatories (NSAID): Secondary | ICD-10-CM | POA: Diagnosis not present

## 2014-11-25 DIAGNOSIS — Z8739 Personal history of other diseases of the musculoskeletal system and connective tissue: Secondary | ICD-10-CM | POA: Insufficient documentation

## 2014-11-25 DIAGNOSIS — Z8781 Personal history of (healed) traumatic fracture: Secondary | ICD-10-CM | POA: Diagnosis not present

## 2014-11-25 DIAGNOSIS — M25551 Pain in right hip: Secondary | ICD-10-CM | POA: Insufficient documentation

## 2014-11-25 DIAGNOSIS — I10 Essential (primary) hypertension: Secondary | ICD-10-CM | POA: Diagnosis not present

## 2014-11-25 DIAGNOSIS — G8929 Other chronic pain: Secondary | ICD-10-CM

## 2014-11-25 DIAGNOSIS — M25561 Pain in right knee: Secondary | ICD-10-CM | POA: Diagnosis not present

## 2014-11-25 MED ORDER — OXYCODONE-ACETAMINOPHEN 5-325 MG PO TABS: 1.0000 | ORAL_TABLET | Freq: Three times a day (TID) | ORAL | Status: DC | PRN

## 2014-11-25 MED ORDER — NAPROXEN 500 MG PO TABS: 500.0000 mg | ORAL_TABLET | Freq: Two times a day (BID) | ORAL | Status: DC

## 2014-11-25 NOTE — ED Notes (Signed)
Pt states "My hip and knee went out". Pt states that he has necrotic joint in the right hip and needs to have it replaced. Pt states that he is unable to put weight on the leg, so he uses crutches.

## 2014-11-25 NOTE — Discharge Instructions (Signed)

## 2014-11-25 NOTE — ED Notes (Signed)
Pt. reports chronic right hip /right knee pain for 1 year - pain worse these past several days unrelieved by prescription Tramadol , denies recent injury or fall, ambulatory using crutches.

## 2014-11-25 NOTE — ED Provider Notes (Signed)
CSN: [id number]     Arrival date & time 11/25/14  1902 History  This chart was scribed for non-physician practitioner, Fayrene Helper, PA-C working with Geoffery Lyons, MD by Greggory Stallion, ED scribe. This patient was seen in room TR11C/TR11C and the patient's care was started at 8:15 PM.   Chief Complaint  Patient presents with  . Hip Pain  . Knee Pain   The history is provided by the patient. No language interpreter was used.    HPI Comments: Sean Mccoy is a 53 y.o. male who presents to the Emergency Department complaining of chronic right hip and knee pain that worsened one week ago. Describes pain as dull and burning. Denies recent fall or injury. States he originally injured his knee and had avascular necrosis in his hip. He has been seen for the same and told to follow up with [facility] to manage his chronic pain. Pt has taken tramadol with no relief. Bearing weight worsens the pain. Denies fever, chills, constipation, difficulty urinating, rash, numbness. Denies history of sickle cell.    Past Medical History  Diagnosis Date  . Hypertension   . Arthritis   . Knee fracture   . Groin injury   . Ankle injury    History reviewed. No pertinent past surgical history. No family history on file. History  Substance Use Topics  . Smoking status: Never Smoker   . Smokeless tobacco: Never Used  . Alcohol Use: Yes     Comment: occ    Review of Systems  Constitutional: Negative for fever and chills.  Gastrointestinal: Negative for constipation.  Genitourinary: Negative for difficulty urinating.  Musculoskeletal: Positive for arthralgias.  Skin: Negative for rash.  Neurological: Negative for numbness.  All other systems reviewed and are negative.  Allergies  Review of patient's allergies indicates no known allergies.  Home Medications   Prior to Admission medications   Medication Sig Start Date End Date Taking? Authorizing Provider  Ascorbic Acid (VITAMIN C PO) Take  1 tablet by mouth daily.    Historical Provider, MD  DULoxetine (CYMBALTA) 20 MG capsule Take 20 mg by mouth daily. For depression. 11/30/12   Verne Spurr, PA-C  ketorolac (TORADOL) 10 MG tablet Take 1 tablet (10 mg total) by mouth every 6 (six) hours as needed. 09/26/14   Earle Gell Cartner, PA-C  naproxen (NAPROSYN) 500 MG tablet Take 1 tablet (500 mg total) by mouth 2 (two) times daily. 08/27/14   Fayrene Helper, PA-C  pravastatin (PRAVACHOL) 20 MG tablet Take 20 mg by mouth every other day. For cholesterol. 11/30/12   Verne Spurr, PA-C  traMADol (ULTRAM) 50 MG tablet Take 1 tablet (50 mg total) by mouth every 12 (twelve) hours as needed. 11/19/14   Ralene Cork, DO  traZODone (DESYREL) 50 MG tablet Take 50 mg by mouth at bedtime as needed for sleep (may repeat times 1). 11/30/12   Verne Spurr, PA-C   BP 147/82 mmHg  Pulse 97  Temp(Src) 97.6 F (36.4 C) (Oral)  Resp 14  Ht 5\' 10"  (1.778 m)  Wt 170 lb (77.111 kg)  BMI 24.39 kg/m2  SpO2 98%   Physical Exam  Constitutional: He is oriented to person, place, and time. He appears well-developed and well-nourished. No distress.  HENT:  Head: Normocephalic and atraumatic.  Eyes: Conjunctivae and EOM are normal.  Neck: Neck supple. No tracheal deviation present.  Cardiovascular: Normal rate.   Pulmonary/Chest: Effort normal. No respiratory distress.  Musculoskeletal: Normal range of motion.  No significant midline spine tenderness. Normal flexion and extension of right knee. Pain with varus and valgus maneuver.   Neurological: He is alert and oriented to person, place, and time.  No foot drop. Patellar DTRs intact bilaterally.  Skin: Skin is warm and dry.  Psychiatric: He has a normal mood and affect. His behavior is normal.  Nursing note and vitals reviewed.   ED Course  Procedures (including critical care time)  DIAGNOSTIC STUDIES: Oxygen Saturation is 98% on RA, normal by my interpretation.    COORDINATION OF CARE: 8:22  PM-Discussed treatment plan which includes a short course of pain medication with pt at bedside and pt agreed to plan. Advised pt that his chronic pain can not be managed in the ED and that he will need to follow up with a specialist. No red flags.    Labs Review Labs Reviewed - No data to display  Imaging Review No results found.   EKG Interpretation None      MDM   Final diagnoses:  Chronic right hip pain  Chronic knee pain, right    BP 147/82 mmHg  Pulse 97  Temp(Src) 97.6 F (36.4 C) (Oral)  Resp 14  Ht 5\' 10"  (1.778 m)  Wt 170 lb (77.111 kg)  BMI 24.39 kg/m2  SpO2 98%   I personally performed the services described in this documentation, which was scribed in my presence. The recorded information has been reviewed and is accurate.  , PA-C 11/25/14 2029  2030, MD 11/26/14 [phone]

## 2014-11-26 ENCOUNTER — Encounter: Payer: Self-pay | Admitting: Sports Medicine

## 2014-11-26 ENCOUNTER — Ambulatory Visit (INDEPENDENT_AMBULATORY_CARE_PROVIDER_SITE_OTHER): Payer: Self-pay | Admitting: Sports Medicine

## 2014-11-26 VITALS — BP 123/76 | HR 91

## 2014-11-26 DIAGNOSIS — M25551 Pain in right hip: Secondary | ICD-10-CM

## 2014-11-26 DIAGNOSIS — G8929 Other chronic pain: Secondary | ICD-10-CM

## 2014-11-26 DIAGNOSIS — M25561 Pain in right knee: Secondary | ICD-10-CM

## 2014-11-26 DIAGNOSIS — M87051 Idiopathic aseptic necrosis of right femur: Secondary | ICD-10-CM

## 2014-11-26 MED ORDER — TRAMADOL HCL 50 MG PO TABS: 50.0000 mg | ORAL_TABLET | Freq: Two times a day (BID) | ORAL | Status: DC | PRN

## 2014-11-26 NOTE — Progress Notes (Signed)
   Subjective:    Patient ID: Sean Mccoy, male    DOB: 13-Jan-1961, 53 y.o.   MRN: [id number]  HPI Mr. Sean Mccoy is a 53 year old male who presents for follow-up of right hip and right knee pain. Since we last saw him he reports continued pain in the right hip and right knee. He says that the corticosteroid injection for the right knee did not provide much significant relief at all. He also obtained x-rays of the right knee and right hip showing degenerative changes in the right hip with signs left avascular necrosis cannot be excluded. The right knee x-ray showed mild degenerative joint disease. Briefly, the pain in the right hip is been present for about a year. No known trauma. 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 His hip pain is diffuse including into the groin. Radiating pain down the leg into his knee. He also is complaining of worsening knee pain. Has a history of a crush injury to this knee 10 years ago and ever since then the knee has been painful. No surgery was performed. Pain is diffuse in the knee. He has not noticed any swelling. Pain in the hip and in the knee are worse with weightbearing. He has been given crutches in the past but comes in today in a wheelchair. He has been treated in the past with naproxen sodium as well as tramadol. Tramadol has not helped as much anymore. His pain will wake him at night. No prior surgeries on either his hip or his knee.  Past medical history, social history, medications, and allergies were reviewed and are up to date in the chart.  Review of Systems 7 point review of systems was performed and was otherwise negative unless noted in the history of present illness.     Objective:   Physical Exam BP 123/76 mmHg  Pulse 91 GEN: The patient is well-developed well-nourished male and in no acute distress.  He is awake alert and oriented x3. SKIN: warm and well-perfused, no rash  EXTR: No lower extremity edema or calf  tenderness Neuro: Strength 5/5 globally. Sensation intact throughout. DTRs 2/4 bilaterally. No focal deficits. Vasc: +2 bilateral distal pulses. No edema.  MSK: Examination of the bilateral hips reveals right hip pain with internal and external rotation. He has some mild tenderness over the right greater trochanter as well. He has no leg length discrepancy. Negative pelvic compression test. Examination of the right knee reveals range of motion from 0-110 with mild pain at the endpoint of motion. He has no medial or lateral joint line tenderness. Positive peripatellar grind test. Equivocal McMurray's. No obvious swelling or induration. He is neurovascularly intact distally.    Assessment & Plan:  1. Right hip pain with concern for likely AVN of right hip 2. Right Knee Pain  -Since injection did not significantly help his right knee pain, I feel that he would likely not benefit from injection of the right hip. -We discussed treatment options at this time, including pain management versus referral for orthopedic consultation in consideration of right hip replacement if indicated -The patient elected for the orthopedic consultation, and we will work with him to arrange this. -He will otherwise follow-up with his primary care doctor.

## 2014-11-29 ENCOUNTER — Encounter (HOSPITAL_COMMUNITY): Payer: Self-pay | Admitting: Emergency Medicine

## 2014-11-29 ENCOUNTER — Emergency Department (HOSPITAL_COMMUNITY)
Admission: EM | Admit: 2014-11-29 | Discharge: 2014-11-29 | Disposition: A | Discharge status: Home / Self Care | Payer: Medicaid Other | Attending: Emergency Medicine | Admitting: Emergency Medicine

## 2014-11-29 DIAGNOSIS — Z79899 Other long term (current) drug therapy: Secondary | ICD-10-CM | POA: Insufficient documentation

## 2014-11-29 DIAGNOSIS — Y9389 Activity, other specified: Secondary | ICD-10-CM | POA: Diagnosis not present

## 2014-11-29 DIAGNOSIS — W19XXXA Unspecified fall, initial encounter: Secondary | ICD-10-CM

## 2014-11-29 DIAGNOSIS — I10 Essential (primary) hypertension: Secondary | ICD-10-CM | POA: Insufficient documentation

## 2014-11-29 DIAGNOSIS — Z8781 Personal history of (healed) traumatic fracture: Secondary | ICD-10-CM | POA: Insufficient documentation

## 2014-11-29 DIAGNOSIS — S79911A Unspecified injury of right hip, initial encounter: Secondary | ICD-10-CM | POA: Diagnosis present

## 2014-11-29 DIAGNOSIS — M1990 Unspecified osteoarthritis, unspecified site: Secondary | ICD-10-CM | POA: Diagnosis not present

## 2014-11-29 DIAGNOSIS — W1839XA Other fall on same level, initial encounter: Secondary | ICD-10-CM | POA: Insufficient documentation

## 2014-11-29 DIAGNOSIS — R52 Pain, unspecified: Secondary | ICD-10-CM

## 2014-11-29 DIAGNOSIS — Y92009 Unspecified place in unspecified non-institutional (private) residence as the place of occurrence of the external cause: Secondary | ICD-10-CM | POA: Insufficient documentation

## 2014-11-29 DIAGNOSIS — Y998 Other external cause status: Secondary | ICD-10-CM | POA: Diagnosis not present

## 2014-11-29 DIAGNOSIS — G8929 Other chronic pain: Secondary | ICD-10-CM | POA: Diagnosis not present

## 2014-11-29 DIAGNOSIS — Z791 Long term (current) use of non-steroidal anti-inflammatories (NSAID): Secondary | ICD-10-CM | POA: Diagnosis not present

## 2014-11-29 MED ORDER — TRAMADOL HCL 50 MG PO TABS: 50.0000 mg | ORAL_TABLET | Freq: Four times a day (QID) | ORAL | Status: DC | PRN

## 2014-11-29 MED ORDER — OXYCODONE-ACETAMINOPHEN 5-325 MG PO TABS
2.0000 | ORAL_TABLET | Freq: Once | ORAL | Status: AC
  Administered 2014-11-29: 2 via ORAL

## 2014-11-29 MED ORDER — OXYCODONE-ACETAMINOPHEN 5-325 MG PO TABS: 1.0000 | ORAL_TABLET | ORAL | Status: DC | PRN

## 2014-11-29 NOTE — ED Notes (Signed)
Pt. lost his balance and fell at home yesterday , denies loc / ambulatory using crutches , reports chronic  pain at right hip and right knee for several months .

## 2014-11-29 NOTE — ED Provider Notes (Signed)
CSN: [id number]     Arrival date & time 11/29/14  1908 History   First MD Initiated Contact with Patient 11/29/14 2008     Chief Complaint  Patient presents with  . Fall     (Consider location/radiation/quality/duration/timing/severity/associated sxs/prior Treatment) HPI Comments: The patient is a 53 year old male, he has a history of avascular necrosis of his hip as well as an injury to his knee many years ago which has left him with severe arthritis and constant pain. He reports having a fall a couple of days ago during which time he reinjured his hip, he has been able to ambulate though with significant amount of pain, this is consistent with his chronic pain just the pain is worsened. He denies any leg length discrepancies, no obvious swelling redness numbness or weakness. He was given a prescription for Percocet and Naprosyn 4 days ago when he visited the emergency department. He did not get this filled. He has been trying to take tramadol at home which she states works for his pain but has run out.  Patient is a 53 y.o. male presenting with fall. The history is provided by the patient and medical records.  Fall    Past Medical History  Diagnosis Date  . Hypertension   . Arthritis   . Knee fracture   . Groin injury   . Ankle injury    History reviewed. No pertinent past surgical history. No family history on file. History  Substance Use Topics  . Smoking status: Never Smoker   . Smokeless tobacco: Never Used  . Alcohol Use: Yes     Comment: occ    Review of Systems  Constitutional: Negative for fever.  Musculoskeletal: Negative for back pain, joint swelling and neck pain.  Neurological: Negative for weakness and numbness.      Allergies  Review of patient's allergies indicates no known allergies.  Home Medications   Prior to Admission medications   Medication Sig Start Date End Date Taking? Authorizing Provider  Ascorbic Acid (VITAMIN C PO) Take 1 tablet by  mouth daily.    Historical Provider, MD  DULoxetine (CYMBALTA) 20 MG capsule Take 20 mg by mouth daily. For depression. 11/30/12   Verne Spurr, PA-C  ketorolac (TORADOL) 10 MG tablet Take 1 tablet (10 mg total) by mouth every 6 (six) hours as needed. 09/26/14   Earle Gell Cartner, PA-C  naproxen (NAPROSYN) 500 MG tablet Take 1 tablet (500 mg total) by mouth 2 (two) times daily. 11/25/14   Fayrene Helper, PA-C  oxyCODONE-acetaminophen (PERCOCET) 5-325 MG per tablet Take 1 tablet by mouth every 4 (four) hours as needed. 11/29/14   Vida Roller, MD  pravastatin (PRAVACHOL) 20 MG tablet Take 20 mg by mouth every other day. For cholesterol. 11/30/12   Verne Spurr, PA-C  traMADol (ULTRAM) 50 MG tablet Take 1 tablet (50 mg total) by mouth every 12 (twelve) hours as needed. 11/26/14   John C Pick-Jacobs, DO  traMADol (ULTRAM) 50 MG tablet Take 1 tablet (50 mg total) by mouth every 6 (six) hours as needed. 11/29/14   Vida Roller, MD  traZODone (DESYREL) 50 MG tablet Take 50 mg by mouth at bedtime as needed for sleep (may repeat times 1). 11/30/12   Verne Spurr, PA-C   BP 147/86 mmHg  Pulse 86  Temp(Src) 98.2 F (36.8 C) (Oral)  Resp 16  Ht 5\' 10"  (1.778 m)  Wt 170 lb (77.111 kg)  BMI 24.39 kg/m2  SpO2 96% Physical Exam  Constitutional: He appears well-developed and well-nourished. No distress.  HENT:  Head: Normocephalic and atraumatic.  Eyes: Conjunctivae are normal. No scleral icterus.  Cardiovascular: Normal rate, regular rhythm and intact distal pulses.   Pulmonary/Chest: Effort normal and breath sounds normal.  Musculoskeletal: He exhibits tenderness ( Tenderness with range of motion of the right hip, no leg length discrepancies, ability to straight leg raise preserved). He exhibits no edema.  Neurological: He is alert.  Normal strength and sensation in all 4 extremities, normal mental status, normal speech.  Skin: Skin is warm and dry. No rash noted. He is not diaphoretic.  Nursing note  and vitals reviewed.   ED Course  Procedures (including critical care time) Labs Review Labs Reviewed - No data to display  Imaging Review No results found.    MDM   Final diagnoses:  Chronic pain    The patient has chronic pain, he does not need repeat imaging as he has baseline range of motion and did not fall specifically on his hip. I have watched him ambulate in the department with his crutches which he uses chronically. [facility] and will need orthopedic follow-up for possible hip replacement.  I have confiscated his prescription with Percocet on it, I will replace it with the same amount of Percocet and tramadol onset of Naprosyn which she states hurts his stomach.  Meds given in ED:  Medications  oxyCODONE-acetaminophen (PERCOCET/ROXICET) 5-325 MG per tablet 2 tablet (not administered)    New Prescriptions   OXYCODONE-ACETAMINOPHEN (PERCOCET) 5-325 MG PER TABLET    Take 1 tablet by mouth every 4 (four) hours as needed.   TRAMADOL (ULTRAM) 50 MG TABLET    Take 1 tablet (50 mg total) by mouth every 6 (six) hours as needed.        Vida Roller, MD 11/29/14 2040

## 2014-11-29 NOTE — Discharge Instructions (Signed)
Please call your doctor for a followup appointment within 24-48 hours. When you talk to your doctor please let them know that you were seen in the emergency department and have them acquire all of your records so that they can discuss the findings with you and formulate a treatment plan to fully care for your new and ongoing problems. ° °

## 2014-12-03 ENCOUNTER — Encounter: Payer: Self-pay | Admitting: *Deleted

## 2014-12-03 ENCOUNTER — Telehealth: Payer: Self-pay | Admitting: Internal Medicine

## 2014-12-03 NOTE — Progress Notes (Signed)
Pt came in today requesting tramadol. After reviewing the narcotic database it was too soon for a refill. I told the pt to call me next week when he is due for another refill.

## 2014-12-03 NOTE — Telephone Encounter (Signed)
Patient has come in today in a lot of pain and requesting a script for traMADol (ULTRAM) 50 MG tablet; Please f/u with patient in the lobby;

## 2014-12-04 ENCOUNTER — Telehealth: Payer: Self-pay | Admitting: *Deleted

## 2014-12-04 NOTE — Telephone Encounter (Signed)
Pt called saying he was in pain and needed more Tramadol.  He just had a script from 11/26/14 for 30 tablets and says he has already finished those.  I told him we are not able to fill anymore at this time, he will need to contact his PCP. He does have another appt to see Dr. Margaretha Sheffield on 12/09/14.

## 2014-12-09 ENCOUNTER — Other Ambulatory Visit: Payer: Self-pay | Admitting: Internal Medicine

## 2014-12-09 ENCOUNTER — Encounter: Payer: Self-pay | Admitting: Sports Medicine

## 2014-12-09 ENCOUNTER — Ambulatory Visit (INDEPENDENT_AMBULATORY_CARE_PROVIDER_SITE_OTHER): Payer: Self-pay | Admitting: Sports Medicine

## 2014-12-09 VITALS — BP 148/87 | HR 99 | Ht 70.0 in | Wt 165.0 lb

## 2014-12-09 DIAGNOSIS — M25551 Pain in right hip: Secondary | ICD-10-CM

## 2014-12-09 DIAGNOSIS — M87051 Idiopathic aseptic necrosis of right femur: Secondary | ICD-10-CM

## 2014-12-09 DIAGNOSIS — G8929 Other chronic pain: Secondary | ICD-10-CM

## 2014-12-09 DIAGNOSIS — M25561 Pain in right knee: Secondary | ICD-10-CM

## 2014-12-09 MED ORDER — TRAMADOL HCL 50 MG PO TABS: 50.0000 mg | ORAL_TABLET | Freq: Two times a day (BID) | ORAL | Status: DC | PRN
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
## 2014-12-13 ENCOUNTER — Ambulatory Visit
Admission: RE | Admit: 2014-12-13 | Discharge: 2014-12-13 | Disposition: A | Discharge status: Home / Self Care | Payer: No Typology Code available for payment source | Source: Ambulatory Visit | Attending: Sports Medicine | Admitting: Sports Medicine

## 2014-12-13 DIAGNOSIS — M25561 Pain in right knee: Secondary | ICD-10-CM

## 2014-12-17 ENCOUNTER — Telehealth: Payer: Self-pay | Admitting: *Deleted

## 2014-12-18 NOTE — Telephone Encounter (Signed)
Spoke to pt about his MRI results, he will see a neurosurgeon this week and follow up with [place] as needed.

## 2014-12-30 ENCOUNTER — Telehealth: Payer: Self-pay | Admitting: Internal Medicine

## 2014-12-30 NOTE — Telephone Encounter (Signed)
Patient called to check on the paperwork he dropped of with his PCP's CMA about 2 weeks ago , patient states that it was a form for his transportation. Pt would like to speak to nurse. Please f/u with pt.

## 2014-12-31 NOTE — Telephone Encounter (Signed)
Pt requested Rx Tramadol  Last Refills 12/09/2014 Last office visit 06/2014 Pt stated has appointment with PCP tomorrow

## 2014-12-31 NOTE — Telephone Encounter (Signed)
I have not seen paperwork for this patient.

## 2014-12-31 NOTE — Telephone Encounter (Signed)
Patient called to check on the paperwork he dropped of with his PCP's CMA about 2 weeks ago , patient states that it was a form for his transportation. Pt would like to speak to nurse. Please f/u with pt. °

## 2015-01-01 ENCOUNTER — Encounter: Payer: Self-pay | Admitting: Internal Medicine

## 2015-01-01 ENCOUNTER — Ambulatory Visit: Payer: Medicaid Other | Attending: Internal Medicine | Admitting: Internal Medicine

## 2015-01-01 VITALS — BP 148/83 | HR 90 | Temp 98.3°F | Resp 16 | Ht 70.0 in | Wt 170.0 lb

## 2015-01-01 DIAGNOSIS — I10 Essential (primary) hypertension: Secondary | ICD-10-CM | POA: Insufficient documentation

## 2015-01-01 DIAGNOSIS — M25551 Pain in right hip: Secondary | ICD-10-CM | POA: Insufficient documentation

## 2015-01-01 DIAGNOSIS — N400 Enlarged prostate without lower urinary tract symptoms: Secondary | ICD-10-CM | POA: Diagnosis not present

## 2015-01-01 DIAGNOSIS — G8929 Other chronic pain: Secondary | ICD-10-CM

## 2015-01-01 DIAGNOSIS — Z79899 Other long term (current) drug therapy: Secondary | ICD-10-CM | POA: Insufficient documentation

## 2015-01-01 DIAGNOSIS — Z0000 Encounter for general adult medical examination without abnormal findings: Secondary | ICD-10-CM

## 2015-01-01 LAB — COMPLETE METABOLIC PANEL WITH GFR
ALBUMIN: 4.3 g/dL (ref 3.5–5.2)
ALT: 23 U/L (ref 0–53)
AST: 32 U/L (ref 0–37)
Alkaline Phosphatase: 104 U/L (ref 39–117)
BUN: 10 mg/dL (ref 6–23)
CO2: 18 mEq/L — ABNORMAL LOW (ref 19–32)
Calcium: 9.7 mg/dL (ref 8.4–10.5)
Chloride: 102 mEq/L (ref 96–112)
Creat: 0.49 mg/dL — ABNORMAL LOW (ref 0.50–1.35)
GFR, Est African American: >89 mL/min
GFR, Est Non African American: >89 mL/min
Glucose, Bld: 120 mg/dL — ABNORMAL HIGH (ref 70–99)
Potassium: 4.2 mEq/L (ref 3.5–5.3)
Sodium: 136 mEq/L (ref 135–145)
Total Bilirubin: 0.4 mg/dL (ref 0.2–1.2)
Total Protein: 8 g/dL (ref 6.0–8.3)

## 2015-01-01 LAB — CBC
HEMATOCRIT: 35.9 % — AB (ref 39.0–52.0)
HEMOGLOBIN: 11.9 g/dL — AB (ref 13.0–17.0)
MCH: 32.5 pg (ref 26.0–34.0)
MCHC: 33.1 g/dL (ref 30.0–36.0)
MCV: 98.1 fL (ref 78.0–100.0)
MPV: 10.3 fL (ref 8.6–12.4)
PLATELETS: 335 10*3/uL (ref 150–400)
RBC: 3.66 MIL/uL — ABNORMAL LOW (ref 4.22–5.81)
RDW: 15.6 % — AB (ref 11.5–15.5)
WBC: 11.6 10*3/uL — ABNORMAL HIGH (ref 4.0–10.5)

## 2015-01-01 LAB — LIPID PANEL
CHOL/HDL RATIO: 2.3 ratio
Cholesterol: 207 mg/dL — ABNORMAL HIGH (ref 0–200)
HDL: 90 mg/dL (ref >39)
LDL CALC: 106 mg/dL — AB (ref 0–99)
Triglycerides: 54 mg/dL (ref <150)
VLDL: 11 mg/dL (ref 0–40)

## 2015-01-01 LAB — HEMOGLOBIN A1C
HEMOGLOBIN A1C: 4.8 % (ref <5.7)
MEAN PLASMA GLUCOSE: 91 mg/dL (ref <117)

## 2015-01-01 LAB — TSH: TSH: 0.326 u[IU]/mL — ABNORMAL LOW (ref 0.350–4.500)

## 2015-01-01 NOTE — Progress Notes (Signed)
Pt is here today following up on his chronic right hip and leg pain. Yesterday pt got a injection in his right hip. Pt is now have more pain w/ headaches. Pt needs refills for his medications. Pt turned in an application for transportation. Pt states that he has an enlarged prostate.

## 2015-01-01 NOTE — Progress Notes (Signed)
Patient ID: Sean Mccoy, male   DOB: 1961/05/23, 54 y.o.   MRN: [id number]  CC: hip pain  HPI: Sean Mccoy is a 54 y.o. male here today for a follow up visit.  Patient has past medical history of hypertension, arthritis, and chronic hip pain.  Patient went to [facility] to receive injections in his right hip.  He was told that he needs to have a hip replacement.  He is requesting pain medication refills today. The pain in his hip is constant and he has been using tramadol for pain relief.   He is also concerned about enlarged prostate.  He reports that he was diagnosed with a enlarged prostate on a scan he had in the ER.  He reports that he voids 5-6 times per night and only occasionally has a weak urine stream.  He denies hesitancy or dribbling.    Patient has No headache, No chest pain, No abdominal pain - No Nausea, No new weakness tingling or numbness, No Cough - SOB.  No Known Allergies Past Medical History  Diagnosis Date  . Hypertension   . Arthritis   . Knee fracture   . Groin injury   . Ankle injury    Current Outpatient Prescriptions on File Prior to Visit  Medication Sig Dispense Refill  . traMADol (ULTRAM) 50 MG tablet Take 1 tablet (50 mg total) by mouth every 12 (twelve) hours as needed. 60 tablet 0  . traZODone (DESYREL) 50 MG tablet Take 50 mg by mouth at bedtime as needed for sleep (may repeat times 1).    . DULoxetine (CYMBALTA) 20 MG capsule Take 20 mg by mouth daily. For depression.    . naproxen (NAPROSYN) 500 MG tablet Take 1 tablet (500 mg total) by mouth 2 (two) times daily. (Patient not taking: Reported on 01/01/2015) 30 tablet 0  . pravastatin (PRAVACHOL) 20 MG tablet Take 20 mg by mouth every other day. For cholesterol.    . traMADol (ULTRAM) 50 MG tablet Take 1 tablet (50 mg total) by mouth every 6 (six) hours as needed. (Patient not taking: Reported on 01/01/2015) 15 tablet 0   No current facility-administered medications on file prior to visit.    History reviewed. No pertinent family history. History   Social History  . Marital Status: Single    Spouse Name: N/A    Number of Children: N/A  . Years of Education: N/A   Occupational History  . Not on file.   Social History Main Topics  . Smoking status: Never Smoker   . Smokeless tobacco: Never Used  . Alcohol Use: Yes     Comment: occ  . Drug Use: No  . Sexual Activity: Yes    Birth Control/ Protection: Condom   Other Topics Concern  . Not on file   Social History Narrative    Review of Systems: See HPI  Objective:   Filed Vitals:   01/01/15 0935  BP: 148/83  Pulse: 90  Temp: 98.3 F (36.8 C)  Resp: 16    Physical Exam  Constitutional: He is oriented to person, place, and time.  Cardiovascular: Normal rate, regular rhythm and normal heart sounds.   Pulmonary/Chest: Effort normal and breath sounds normal.  Musculoskeletal: He exhibits no edema or tenderness.  Pain with passive ROM of right hip  Neurological: He is alert and oriented to person, place, and time.     Lab Results  Component Value Date   WBC 4.0 11/27/2012   HGB 13.3 08/22/2014  HCT 39.0 08/22/2014   MCV 92.9 11/27/2012   PLT 197 11/27/2012   Lab Results  Component Value Date   CREATININE 0.90 08/22/2014   BUN 5* 08/22/2014   NA 134* 08/22/2014   K 3.9 08/22/2014   CL 101 08/22/2014   CO2 21 11/27/2012    No results found for: HGBA1C Lipid Panel  No results found for: CHOL, TRIG, HDL, CHOLHDL, VLDL, LDLCALC     Assessment and plan:   Sean Mccoy was seen today for follow-up.  Diagnoses and associated orders for this visit:  Chronic right hip pain No pain medication until after 1/14 will be given  Enlarged prostate - PSA---will call with results to determine next steps  Preventative health care - CBC - COMPLETE METABOLIC PANEL WITH GFR - Hemoglobin A1C - Lipid panel - TSH  Return if symptoms worsen or fail to improve.      Holland Commons, NP-C [facility] [phone] 01/01/2015, 10:15 AM

## 2015-01-02 LAB — PSA: PSA: 0.73 ng/mL (ref <=4.00)

## 2015-01-09 ENCOUNTER — Telehealth: Payer: Self-pay | Admitting: Internal Medicine

## 2015-01-09 ENCOUNTER — Telehealth: Payer: Self-pay | Admitting: *Deleted

## 2015-01-09 NOTE — Telephone Encounter (Signed)
Patient has come into the clinic today to request a refill for medication traMADol (ULTRAM) 50 MG tablet; please f/u with patient in lobby about request

## 2015-01-10 ENCOUNTER — Other Ambulatory Visit: Payer: Self-pay | Admitting: Internal Medicine

## 2015-01-10 DIAGNOSIS — M25551 Pain in right hip: Secondary | ICD-10-CM

## 2015-01-10 DIAGNOSIS — G8929 Other chronic pain: Secondary | ICD-10-CM

## 2015-01-10 MED ORDER — TRAMADOL HCL 50 MG PO TABS: 50.0000 mg | ORAL_TABLET | Freq: Two times a day (BID) | ORAL | Status: DC | PRN

## 2015-01-21 ENCOUNTER — Telehealth: Payer: Self-pay | Admitting: *Deleted

## 2015-01-21 NOTE — Telephone Encounter (Signed)
-----   Message from Ambrose Finland, NP sent at 01/14/2015 11:06 PM EST ----- Make sure to stress to patient to take his cholesterol medication. If he admits to taking the medication daily that he may need a increase in dose. TSH-thyroid hormone was slightly low and we will recheck in 6 months. Please have him to come back for a BP recheck. If his BP remains elevated he may need to begin on BP medication as well.  BP was 148 the past two times.

## 2015-01-21 NOTE — Telephone Encounter (Signed)
Left voice message to return call 

## 2015-01-27 ENCOUNTER — Ambulatory Visit: Payer: No Typology Code available for payment source | Attending: Internal Medicine

## 2015-01-29 ENCOUNTER — Encounter: Payer: Self-pay | Admitting: Internal Medicine

## 2015-01-29 ENCOUNTER — Ambulatory Visit: Payer: Medicaid Other | Attending: Internal Medicine | Admitting: Internal Medicine

## 2015-01-29 VITALS — BP 129/77 | HR 83 | Temp 98.0°F | Resp 16 | Ht 70.0 in | Wt 175.0 lb

## 2015-01-29 DIAGNOSIS — M25551 Pain in right hip: Secondary | ICD-10-CM

## 2015-01-29 DIAGNOSIS — G8929 Other chronic pain: Secondary | ICD-10-CM | POA: Insufficient documentation

## 2015-01-29 MED ORDER — ACETAMINOPHEN-CODEINE #3 300-30 MG PO TABS: 1.0000 | ORAL_TABLET | Freq: Three times a day (TID) | ORAL | Status: DC | PRN

## 2015-01-29 NOTE — Progress Notes (Signed)
Patient ID: Sean Mccoy, male   DOB: Dec 02, 1961, 54 y.o.   MRN: [id number]  CC: right hip pain  HPI: Sean Mccoy is a 54 y.o. male here today for a follow up visit.  Patient has past medical history of chronic right hip pain with AVN.  He reports that he has been seeing orthopedics but is in the deciding point of having surgery.  Patient reports that he will have to wait on Medicaid or disability approval in order to be able to afford the surgery.  Today he states that he has had to take a additional tramadol in order to take the edge off the pain.  He is requesting something different to help him with pain management.  He is also requesting a pain management referral. Pain is same in character and is constant.   Patient has No headache, No chest pain, No abdominal pain - No Nausea, No new weakness tingling or numbness, No Cough - SOB.  No Known Allergies Past Medical History  Diagnosis Date  . Hypertension   . Arthritis   . Knee fracture   . Groin injury   . Ankle injury    Current Outpatient Prescriptions on File Prior to Visit  Medication Sig Dispense Refill  . DULoxetine (CYMBALTA) 20 MG capsule Take 20 mg by mouth daily. For depression.    . naproxen (NAPROSYN) 500 MG tablet Take 1 tablet (500 mg total) by mouth 2 (two) times daily. (Patient not taking: Reported on 01/01/2015) 30 tablet 0  . pravastatin (PRAVACHOL) 20 MG tablet Take 20 mg by mouth every other day. For cholesterol.    . traMADol (ULTRAM) 50 MG tablet Take 1 tablet (50 mg total) by mouth every 12 (twelve) hours as needed. 60 tablet 0  . traZODone (DESYREL) 50 MG tablet Take 50 mg by mouth at bedtime as needed for sleep (may repeat times 1).     No current facility-administered medications on file prior to visit.   History reviewed. No pertinent family history. History   Social History  . Marital Status: Single    Spouse Name: N/A    Number of Children: N/A  . Years of Education: N/A   Occupational History  .  Not on file.   Social History Main Topics  . Smoking status: Never Smoker   . Smokeless tobacco: Never Used  . Alcohol Use: Yes     Comment: occ  . Drug Use: No  . Sexual Activity: Yes    Birth Control/ Protection: Condom   Other Topics Concern  . Not on file   Social History Narrative    Review of Systems: See HPI   Objective:   Filed Vitals:   01/29/15 1739  BP: 129/77  Pulse: 83  Temp: 98 F (36.7 C)  Resp: 16    Physical Exam  Cardiovascular: Normal rate, regular rhythm and normal heart sounds.   Pulmonary/Chest: Effort normal and breath sounds normal.  Musculoskeletal: He exhibits tenderness (right hip). He exhibits no edema.     Lab Results  Component Value Date   WBC 11.6* 01/01/2015   HGB 11.9* 01/01/2015   HCT 35.9* 01/01/2015   MCV 98.1 01/01/2015   PLT 335 01/01/2015   Lab Results  Component Value Date   CREATININE 0.49* 01/01/2015   BUN 10 01/01/2015   NA 136 01/01/2015   K 4.2 01/01/2015   CL 102 01/01/2015   CO2 18* 01/01/2015    Lab Results  Component Value Date   HGBA1C  4.8 01/01/2015   Lipid Panel     Component Value Date/Time   CHOL 207* 01/01/2015 1042   TRIG 54 01/01/2015 1042   HDL 90 01/01/2015 1042   CHOLHDL 2.3 01/01/2015 1042   VLDL 11 01/01/2015 1042   LDLCALC 106* 01/01/2015 1042       Assessment and plan:   Sean Mccoy was seen today for follow-up.  Diagnoses and associated orders for this visit:  Chronic hip pain, right - acetaminophen-codeine (TYLENOL #3) 300-30 MG per tablet; Take 1-2 tablets by mouth every 8 (eight) hours as needed for moderate pain.  Explained that if he runs out of medication early then he will not be able to get additional refills. Explained to patient that I do not want him to get addicted to medication that is only meant to take the edge off the chronic pain.  Explained that pain medication is not meant to correct the problem.   Return if symptoms worsen or fail to improve.        Holland Commons, NP-C [facility] [phone] 01/29/2015, 6:08 PM

## 2015-01-29 NOTE — Progress Notes (Signed)
Pt is here following up on his chronic pain in his right hip and knee. IT seems that his condition is getting worse.

## 2015-02-10 ENCOUNTER — Ambulatory Visit: Payer: Self-pay | Admitting: Internal Medicine

## 2015-02-15 ENCOUNTER — Emergency Department (HOSPITAL_COMMUNITY): Payer: Medicaid Other

## 2015-02-15 ENCOUNTER — Encounter (HOSPITAL_COMMUNITY): Payer: Self-pay

## 2015-02-15 ENCOUNTER — Emergency Department (HOSPITAL_COMMUNITY)
Admission: EM | Admit: 2015-02-15 | Discharge: 2015-02-16 | Disposition: A | Discharge status: Home / Self Care | Payer: Medicaid Other | Attending: Emergency Medicine | Admitting: Emergency Medicine

## 2015-02-15 DIAGNOSIS — Y998 Other external cause status: Secondary | ICD-10-CM | POA: Diagnosis not present

## 2015-02-15 DIAGNOSIS — W19XXXA Unspecified fall, initial encounter: Secondary | ICD-10-CM

## 2015-02-15 DIAGNOSIS — Y9301 Activity, walking, marching and hiking: Secondary | ICD-10-CM | POA: Insufficient documentation

## 2015-02-15 DIAGNOSIS — Z79899 Other long term (current) drug therapy: Secondary | ICD-10-CM | POA: Insufficient documentation

## 2015-02-15 DIAGNOSIS — W1839XA Other fall on same level, initial encounter: Secondary | ICD-10-CM | POA: Insufficient documentation

## 2015-02-15 DIAGNOSIS — I10 Essential (primary) hypertension: Secondary | ICD-10-CM | POA: Insufficient documentation

## 2015-02-15 DIAGNOSIS — S79911A Unspecified injury of right hip, initial encounter: Secondary | ICD-10-CM | POA: Diagnosis not present

## 2015-02-15 DIAGNOSIS — Y9289 Other specified places as the place of occurrence of the external cause: Secondary | ICD-10-CM | POA: Insufficient documentation

## 2015-02-15 DIAGNOSIS — Y9389 Activity, other specified: Secondary | ICD-10-CM | POA: Diagnosis not present

## 2015-02-15 DIAGNOSIS — M1990 Unspecified osteoarthritis, unspecified site: Secondary | ICD-10-CM | POA: Insufficient documentation

## 2015-02-15 DIAGNOSIS — M25551 Pain in right hip: Secondary | ICD-10-CM

## 2015-02-15 MED ORDER — FENTANYL CITRATE 0.05 MG/ML IJ SOLN
50.0000 ug | Freq: Once | INTRAMUSCULAR | Status: AC
  Administered 2015-02-15: 50 ug via INTRAVENOUS
  Filled 2015-02-15: qty 2

## 2015-02-15 MED ORDER — ACETAMINOPHEN 325 MG PO TABS
650.0000 mg | ORAL_TABLET | Freq: Once | ORAL | Status: AC
  Administered 2015-02-15: 650 mg via ORAL
  Filled 2015-02-15: qty 2

## 2015-02-15 NOTE — ED Notes (Signed)
Pt ambulated in room.  Able to walk with a limp, without the assistance of the crutch.  No assistance from RN needed to get out of bed.    Pt more awake now and states he is concerned about the swelling in his legs.

## 2015-02-15 NOTE — ED Notes (Signed)
To triage via EMS. Pt c/o right hip pain x several months.  Onset today pt walking with crutches and fell down on ground.  ETOH today.
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
## 2015-02-15 NOTE — ED Notes (Addendum)
Called MRI about results for MRI.  Tech will call Radiologist to find out about report.

## 2015-02-19 ENCOUNTER — Ambulatory Visit: Payer: Medicaid Other | Admitting: Sports Medicine

## 2015-03-06 ENCOUNTER — Telehealth: Payer: Self-pay | Admitting: Internal Medicine

## 2015-03-06 NOTE — Telephone Encounter (Signed)
Pt called requesting medication refill on traMADol (ULTRAM) 50 MG tablet. Please f/u with pt  ° °

## 2015-03-11 ENCOUNTER — Emergency Department (HOSPITAL_COMMUNITY): Payer: Medicaid Other

## 2015-03-11 ENCOUNTER — Encounter (HOSPITAL_COMMUNITY): Payer: Self-pay | Admitting: Emergency Medicine

## 2015-03-11 ENCOUNTER — Emergency Department (HOSPITAL_COMMUNITY)
Admission: EM | Admit: 2015-03-11 | Discharge: 2015-03-11 | Disposition: A | Discharge status: Home / Self Care | Payer: Medicaid Other | Attending: Emergency Medicine | Admitting: Emergency Medicine

## 2015-03-11 DIAGNOSIS — J159 Unspecified bacterial pneumonia: Secondary | ICD-10-CM | POA: Diagnosis not present

## 2015-03-11 DIAGNOSIS — Z760 Encounter for issue of repeat prescription: Secondary | ICD-10-CM | POA: Diagnosis not present

## 2015-03-11 DIAGNOSIS — I10 Essential (primary) hypertension: Secondary | ICD-10-CM | POA: Insufficient documentation

## 2015-03-11 DIAGNOSIS — R05 Cough: Secondary | ICD-10-CM | POA: Diagnosis present

## 2015-03-11 DIAGNOSIS — M1990 Unspecified osteoarthritis, unspecified site: Secondary | ICD-10-CM | POA: Insufficient documentation

## 2015-03-11 DIAGNOSIS — Z791 Long term (current) use of non-steroidal anti-inflammatories (NSAID): Secondary | ICD-10-CM | POA: Diagnosis not present

## 2015-03-11 DIAGNOSIS — J189 Pneumonia, unspecified organism: Secondary | ICD-10-CM

## 2015-03-11 DIAGNOSIS — Z8781 Personal history of (healed) traumatic fracture: Secondary | ICD-10-CM | POA: Insufficient documentation

## 2015-03-11 DIAGNOSIS — Z87828 Personal history of other (healed) physical injury and trauma: Secondary | ICD-10-CM | POA: Diagnosis not present

## 2015-03-11 MED ORDER — AZITHROMYCIN 250 MG PO TABS
500.0000 mg | ORAL_TABLET | Freq: Once | ORAL | Status: AC
  Administered 2015-03-11: 500 mg via ORAL
  Filled 2015-03-11: qty 2

## 2015-03-11 MED ORDER — ONDANSETRON HCL 4 MG PO TABS
4.0000 mg | ORAL_TABLET | Freq: Once | ORAL | Status: AC
  Administered 2015-03-11: 4 mg via ORAL
  Filled 2015-03-11: qty 1

## 2015-03-11 MED ORDER — AZITHROMYCIN 250 MG PO TABS: ORAL_TABLET | ORAL | Status: DC

## 2015-03-11 NOTE — ED Notes (Signed)
Pt sts flu like sx x 1 week with cough and body aches; pt sts also out of pain meds after having hip sx

## 2015-03-11 NOTE — ED Provider Notes (Signed)
CSN: [id number]     Arrival date & time 03/11/15  1630 History   First MD Initiated Contact with Patient 03/11/15 2029     Chief Complaint  Patient presents with  . Generalized Body Aches  . Medication Refill     (Consider location/radiation/quality/duration/timing/severity/associated sxs/prior Treatment) HPI   54 y/o male w/ no sig PMH p/w 1 wk of cough, body aches, voice hoarseness.  The cough has been productive and associated w/ shortness of breath and mild substernal, burning chest pain when he coughs.  No pain if he doesn't cough.  No measured fevers at home.  No hx of CAD, COPD, asthma  Past Medical History  Diagnosis Date  . Hypertension   . Arthritis   . Knee fracture   . Groin injury   . Ankle injury    History reviewed. No pertinent past surgical history. History reviewed. No pertinent family history. History  Substance Use Topics  . Smoking status: Never Smoker   . Smokeless tobacco: Never Used  . Alcohol Use: Yes     Comment: occ    Review of Systems  Constitutional: Negative for fever and chills.  HENT: Positive for rhinorrhea and voice change.   Eyes: Negative for redness.  Respiratory: Positive for cough. Negative for shortness of breath.   Cardiovascular: Positive for chest pain.  Gastrointestinal: Negative for nausea, vomiting, abdominal pain and diarrhea.  Genitourinary: Negative for dysuria.  Musculoskeletal: Positive for myalgias.  Skin: Negative for rash.  Neurological: Negative for headaches.  All other systems reviewed and are negative.     Allergies  Review of patient's allergies indicates no known allergies.  Home Medications   Prior to Admission medications   Medication Sig Start Date End Date Taking? Authorizing Provider  acetaminophen-codeine (TYLENOL #3) 300-30 MG per tablet Take 1-2 tablets by mouth every 8 (eight) hours as needed for moderate pain. Patient not taking: Reported on 02/15/2015 01/29/15   Ambrose Finland, NP   naproxen (NAPROSYN) 500 MG tablet Take 1 tablet (500 mg total) by mouth 2 (two) times daily. Patient not taking: Reported on 01/01/2015 11/25/14   Fayrene Helper, PA-C  traMADol (ULTRAM) 50 MG tablet Take 1 tablet (50 mg total) by mouth every 12 (twelve) hours as needed. Patient not taking: Reported on 02/15/2015 01/10/15   Ambrose Finland, NP   BP 151/85 mmHg  Pulse 90  Temp(Src) 98.5 F (36.9 C)  Resp 18  SpO2 99% Physical Exam  Constitutional: He is oriented to person, place, and time. No distress.  HENT:  Head: Normocephalic and atraumatic.  Eyes: EOM are normal. Pupils are equal, round, and reactive to light.  Neck: Normal range of motion. Neck supple.  Cardiovascular: Normal rate.   Pulmonary/Chest: Effort normal. No respiratory distress. He has rales.  Abdominal: Soft. There is no tenderness.  Musculoskeletal: Normal range of motion.  Neurological: He is alert and oriented to person, place, and time.  Skin: No rash noted. He is not diaphoretic.  Psychiatric: He has a normal mood and affect.    ED Course  Procedures (including critical care time) Labs Review Labs Reviewed - No data to display  Imaging Review Dg Chest 2 View (if Patient Has Fever And/or Copd)  03/11/2015   CLINICAL DATA:  Acute onset of cough, fever and body aches. Initial encounter.  EXAM: CHEST  2 VIEW  COMPARISON:  CT of the chest performed 08/22/2014  FINDINGS: Focal right mid lung and right basilar airspace opacities are compatible with pneumonia.  No pleural effusion or pneumothorax is seen. There is question of mild retrocardiac opacity.  The heart is mildly enlarged. No acute osseous abnormalities are seen.  IMPRESSION: Focal predominantly right-sided airspace opacities, compatible with pneumonia. Would perform follow-up chest radiograph after completion of treatment, to ensure resolution of airspace opacities.   Electronically Signed   By: Roanna Raider M.D.   On: 03/11/2015 17:55     EKG  Interpretation None      MDM   Final diagnoses:  None    54 y/o male w/ no sig PMH p/w 1 wk of cough, body aches, voice hoarseness.  The cough has been productive and associated w/ shortness of breath and mild substernal, burning chest pain when he coughs.  No pain if he doesn't cough.  Exam as above, afebrile, HDS, satting well on room air.  Normal work of breathing, appears very well, non-toxic  Doubt ACS/PE as cause of the chest pain as he only has pain when he coughs.  No leg swelling, no prev dvt/pe.  ekg w/o ischemic changes.  CXR concerning for pneumonia.  This would be CAP.  Will treat with azithro.  I have discussed the results, Dx and Tx plan with the patient. They expressed understanding and agree with the plan and were told to return to ED with any worsening of condition or concern.    Disposition: Discharge  Condition: Good  New Prescriptions   AZITHROMYCIN (ZITHROMAX Z-PAK) 250 MG TABLET    Take two tabs the first day and one tablet each day after    Follow Up: Ambrose Finland, NP [address] [place] [address] [phone]  In 3 days    Pt seen in conjunction with Dr. Christ Kick, MD 03/11/15 2138  Mirian Mo, MD 03/17/15 2014

## 2015-03-11 NOTE — Discharge Instructions (Signed)

## 2015-03-11 NOTE — ED Notes (Signed)
EKG completed given to EDP.  

## 2015-03-12 ENCOUNTER — Telehealth: Payer: Self-pay | Admitting: Internal Medicine

## 2015-03-12 NOTE — Telephone Encounter (Signed)
Pt went to ED and was prescribed a Z-pak for CAP but states that he has cold symptoms and would like to be prescribed something for symptoms in the meantime that the z-pak takes effect. Pt would like to speak to clinician regarding request, please f/u with pt.

## 2015-03-19 ENCOUNTER — Emergency Department (HOSPITAL_COMMUNITY): Payer: Medicaid Other

## 2015-03-19 ENCOUNTER — Ambulatory Visit: Payer: Self-pay | Admitting: Internal Medicine

## 2015-03-19 ENCOUNTER — Emergency Department (HOSPITAL_COMMUNITY)
Admission: EM | Admit: 2015-03-19 | Discharge: 2015-03-19 | Disposition: A | Discharge status: Home / Self Care | Payer: Medicaid Other | Attending: Emergency Medicine | Admitting: Emergency Medicine

## 2015-03-19 ENCOUNTER — Encounter (HOSPITAL_COMMUNITY): Payer: Self-pay | Admitting: Emergency Medicine

## 2015-03-19 DIAGNOSIS — I10 Essential (primary) hypertension: Secondary | ICD-10-CM | POA: Insufficient documentation

## 2015-03-19 DIAGNOSIS — Y9289 Other specified places as the place of occurrence of the external cause: Secondary | ICD-10-CM | POA: Diagnosis not present

## 2015-03-19 DIAGNOSIS — Y998 Other external cause status: Secondary | ICD-10-CM | POA: Diagnosis not present

## 2015-03-19 DIAGNOSIS — M1990 Unspecified osteoarthritis, unspecified site: Secondary | ICD-10-CM | POA: Insufficient documentation

## 2015-03-19 DIAGNOSIS — M87051 Idiopathic aseptic necrosis of right femur: Secondary | ICD-10-CM | POA: Diagnosis not present

## 2015-03-19 DIAGNOSIS — S0990XA Unspecified injury of head, initial encounter: Secondary | ICD-10-CM | POA: Diagnosis present

## 2015-03-19 DIAGNOSIS — Y9389 Activity, other specified: Secondary | ICD-10-CM | POA: Insufficient documentation

## 2015-03-19 DIAGNOSIS — M25551 Pain in right hip: Secondary | ICD-10-CM

## 2015-03-19 DIAGNOSIS — S0003XA Contusion of scalp, initial encounter: Secondary | ICD-10-CM | POA: Diagnosis not present

## 2015-03-19 DIAGNOSIS — W109XXA Fall (on) (from) unspecified stairs and steps, initial encounter: Secondary | ICD-10-CM | POA: Insufficient documentation

## 2015-03-19 LAB — CBC WITH DIFFERENTIAL/PLATELET
BASOS PCT: 0 % (ref 0–1)
Basophils Absolute: 0 10*3/uL (ref 0.0–0.1)
Eosinophils Absolute: 0 10*3/uL (ref 0.0–0.7)
Eosinophils Relative: 0 % (ref 0–5)
HEMATOCRIT: 35.3 % — AB (ref 39.0–52.0)
Hemoglobin: 12 g/dL — ABNORMAL LOW (ref 13.0–17.0)
Lymphocytes Relative: 41 % (ref 12–46)
Lymphs Abs: 2.2 10*3/uL (ref 0.7–4.0)
MCH: 31.7 pg (ref 26.0–34.0)
MCHC: 34 g/dL (ref 30.0–36.0)
MCV: 93.4 fL (ref 78.0–100.0)
MONOS PCT: 15 % — AB (ref 3–12)
Monocytes Absolute: 0.8 10*3/uL (ref 0.1–1.0)
NEUTROS PCT: 44 % (ref 43–77)
Neutro Abs: 2.4 10*3/uL (ref 1.7–7.7)
Platelets: 339 10*3/uL (ref 150–400)
RBC: 3.78 MIL/uL — ABNORMAL LOW (ref 4.22–5.81)
RDW: 14.4 % (ref 11.5–15.5)
WBC: 5.3 10*3/uL (ref 4.0–10.5)

## 2015-03-19 LAB — BASIC METABOLIC PANEL
Anion gap: 11 (ref 5–15)
BUN: 17 mg/dL (ref 6–23)
CALCIUM: 9.8 mg/dL (ref 8.4–10.5)
CHLORIDE: 100 mmol/L (ref 96–112)
CO2: 26 mmol/L (ref 19–32)
Creatinine, Ser: 0.96 mg/dL (ref 0.50–1.35)
GFR calc Af Amer: >90 mL/min (ref >90)
GFR calc non Af Amer: >90 mL/min (ref >90)
Glucose, Bld: 97 mg/dL (ref 70–99)
POTASSIUM: 4.3 mmol/L (ref 3.5–5.1)
Sodium: 137 mmol/L (ref 135–145)

## 2015-03-19 MED ORDER — KETOROLAC TROMETHAMINE 15 MG/ML IJ SOLN
15.0000 mg | Freq: Once | INTRAMUSCULAR | Status: DC
  Filled 2015-03-19: qty 1

## 2015-03-19 MED ORDER — KETOROLAC TROMETHAMINE 15 MG/ML IJ SOLN
15.0000 mg | Freq: Once | INTRAMUSCULAR | Status: AC
  Administered 2015-03-19: 15 mg via INTRAVENOUS
  Filled 2015-03-19: qty 1

## 2015-03-19 MED ORDER — HYDROMORPHONE HCL 1 MG/ML IJ SOLN
1.0000 mg | Freq: Once | INTRAMUSCULAR | Status: AC
  Administered 2015-03-19: 1 mg via INTRAVENOUS
  Filled 2015-03-19: qty 1

## 2015-03-19 MED ORDER — SODIUM CHLORIDE 0.9 % IV BOLUS (SEPSIS)
1000.0000 mL | Freq: Once | INTRAVENOUS | Status: AC
  Administered 2015-03-19: 1000 mL via INTRAVENOUS

## 2015-03-19 NOTE — Discharge Instructions (Signed)
°Emergency Department Resource Guide °1) Find a Doctor and Pay Out of Pocket °Although you won't have to find out who is covered by your insurance plan, it is a good idea to ask around and get recommendations. You will then need to call the office and see if the doctor you have chosen will accept you as a new patient and what types of options they offer for patients who are self-pay. Some doctors offer discounts or will set up payment plans for their patients who do not have insurance, but you will need to ask so you aren't surprised when you get to your appointment. ° °2) Contact Your Local Health Department °Not all health departments have doctors that can see patients for sick visits, but many do, so it is worth a call to see if yours does. If you don't know where your local health department is, you can check in your phone book. The CDC also has a tool to help you locate your state's health department, and many state websites also have listings of all of their local health departments. ° °3) Find a Walk-in Clinic °If your illness is not likely to be very severe or complicated, you may want to try a walk in clinic. These are popping up all over the country in pharmacies, drugstores, and shopping centers. They're usually staffed by nurse practitioners or physician assistants that have been trained to treat common illnesses and complaints. They're usually fairly quick and inexpensive. However, if you have serious medical issues or chronic medical problems, these are probably not your best option. ° °No Primary Care Doctor: °- Call [facility] at  832-8000 - they can help you locate a primary care doctor that  accepts your insurance, provides certain services, etc. °- Physician Referral Service- 1-800-533-3463 ° °Chronic Pain Problems: °Organization         Address  Phone   Notes  °[facility]  (336) 297-2271 Patients need to be referred by their primary care doctor.  ° °Medication  Assistance: °Organization         Address  Phone   Notes  °[place] [facility] [address] °[place], [place] [address] (336) 641-8030 --Must be a resident of [place] °-- Must have NO insurance coverage whatsoever (no Medicaid/ Medicare, etc.) °-- The pt. MUST have a primary care doctor that directs their care regularly and follows them in the community °  °[facility]  (866) 331-1348   °[employer]  (888) 892-1162   ° °Agencies that provide inexpensive medical care: °Organization         Address  Phone   Notes  °[facility]  (336) 832-8035   °[facility]    (336) 832-7272   °[facility] [address] °[place], [place] [address] (336) 832-4777   °[facility] [address], °[place] (336) 271-4999   °[facility]    (336) 373-0678   °[facility]    (336) 272-1050   °[facility] ° [address], [place] Phone:  (336) 832-4444, Fax:  (336) 832-4440 Hours of Operation:  9 am - 6 pm, M-F.  Also accepts Medicaid/Medicare and self-pay.  °[facility] ° [address], [place] Phone: (336) 832-3150, Fax: (336) 832-3151. Hours of Operation:  8:30 am - 5:30 pm, M-F.  Also accepts Medicaid and self-pay.  °[facility] [place] [address], [place] Phone: (336) 878-6027   °[facility] [address], [place], [place] (336)723-1848, Ext. 123 Mondays & Thursdays: 7-9 AM.  First 15 patients are seen on a first come, first serve basis. °  ° °Medicaid-accepting [place] Providers: ° °Organization         Address  Phone   Notes  °[facility] [address], [place] (336) 641-2100 Also accepts self-pay patients.  °[facility] [address], [place] ° (336) 856-9996   °[facility] [address], [place]  (336) 288-8857   °[facility] [address] [place] [address], [place] (336) 299-7000   °Veita Bland [address], [place]  ° (336) 373-1557 Only accepts [place] Access Medicaid patients after they have their name applied to their card.  ° °Self-Pay (no insurance) in [place]: ° °Organization         Address  Phone   Notes  °Sickle Cell Patients, [facility] [address], [place] (336) 832-1970   °[facility] [address], [place] (336) 832-4400   °[facility] ° [address],  (336) 992-4800   °[facility]/Dr. Osei-Bonsu ° [address] [place] [address], [place] or [address], [place] (336) 841-8500 Phone number for both [place] and [place] locations is the same.  °[facility] [address], [place] (336) 299-0000   °[facility] [address] [place] [address], [place] or [address] (336) 852-7530 °(336) 878-2260   °[facility] [address], [place] (336) 350-1642, phone; (336) 294-5005, fax Sees patients 1st and 3rd Saturday of every month.  Must not qualify for public or private insurance (i.e. Medicaid, Medicare, [place] Health Choice, Veterans' Benefits) • Household income should be no more than 200% of the poverty level •The clinic cannot treat you if you are pregnant or think you are pregnant • Sexually transmitted diseases are not treated at the clinic.  ° ° °Dental Care: °Organization         Address  Phone  Notes  °[place] [facility] [address], [place] (336) 641-6152 Accepts children up to age 21 who are enrolled in Medicaid or [place] Health Choice; pregnant women with a Medicaid card; and children who have applied for Medicaid or [place] Health Choice, but were declined, whose parents can pay a reduced fee at time of service.  °[place]  [facility] [place]  [address], [place] (336) 641-7733 Accepts children up to age 21 who are enrolled in Medicaid or [place] Health Choice; pregnant women with a Medicaid card; and children who have applied for Medicaid or  Health Choice, but were declined, whose parents can pay a reduced fee at time of service.  °[facility] ° [address],  (336) 641-4533 Patients are seen by appointment only. Walk-ins are not accepted. [facility] will see patients 18 years of age and older. °Monday - Tuesday (8am-5pm) °Most Wednesdays (8:30-5pm) °$30 per visit, cash only  °[facility] ° [address], [place] (336) 641-4533 Patients are seen by appointment only. Walk-ins are not accepted. [facility] will see patients 18 years of age and older. °One   Wednesday Evening (Monthly: Volunteer Based).  $30 per visit, cash only  °[facility]  (919) 537-3737 for adults; Children under age 4, call Graduate Pediatric Dentistry at (919) [phone]. Children aged 4-14, please call (919) [phone] to request a pediatric application. ° Dental services are provided in all areas of dental care including fillings, crowns and bridges, complete and partial dentures, implants, gum treatment, root canals, and extractions. Preventive care is also provided. Treatment is provided to both adults and children. °Patients are selected via a lottery and there is often a waiting list. °  °[facility] [address], °[place] ° (336) 763-8833 [email or web address] °  °[facility] [address], [place], [place] (336)723-1848, Ext. 123 Second and Fourth Thursday of each month, opens at 6:30 AM; Clinic ends at 9 AM.  Patients are seen on a first-come first-served basis, and a limited number are seen during each clinic.  ° °[facility] ° [address], [place], [place] (336) 723-7904    Eligibility Requirements °You must have lived in [place], or [place] counties for at least the last three months. °  You cannot be eligible for state or federal sponsored healthcare insurance, including Veterans Administration, Medicaid, or Medicare. °  You generally cannot be eligible for healthcare insurance through your employer.  °  How to apply: °Eligibility screenings are held every Tuesday and Wednesday afternoon from 1:00 pm until 4:00 pm. You do not need an appointment for the interview!  °[facility] [address], [place], [place] 336-631-2330   °[facility]  336-342-8273   °[facility]  336-703-3100   °[facility]  336-570-6415   ° °Behavioral Health Resources in the Community: °Intensive Outpatient Programs °Organization         Address  Phone  Notes  °[facility] [address], [place], [place] 336-878-6098   °[facility] [address], [place], [place] 336-832-9800   °[facility] [address], [place], [place] ° 336-882-2125   °[facility] [address],  °[place], [place] 1-800-853-5163 or 336-641-4981   °Substance Abuse Resources °Organization         Address  Phone  Notes  °[facility]  336-882-2125   °[facility]  336-784-9470   °[facility]  336-285-9073   °[facility]  336-845-3988   °Residential & Outpatient Substance Abuse Program  1-800-659-3381   °Psychological Services °Organization         Address  Phone  Notes  °[facility]  336- 832-9600   °[facility]  336- 378-7881   °[facility] [address], [place] 1-800-853-5163 or 336-641-4981   ° °Mobile Crisis Teams °Organization         Address  Phone  Notes  °[facility], Mobile Crisis Care Unit  1-877-626-1772   °Assertive °[facility] ° [address]  [place], [place] 336-834-9664   °Sharon DeEsch [address] °[place] 336-554-5454   ° °Self-Help/Support Groups °Organization         Address  Phone             Notes  °[facility] - variety of support groups  336- 373-1402 Call for more information  °Narcotics Anonymous (NA), [facility] [address], °[place]   2 meetings at this location  ° °  Residential Treatment Programs °Organization         Address  Phone  Notes  °[facility] [address],    °[place]  1-866-801-8205   °[facility] ° [address], [place], [place] 704-293-8524   °[facility] [address], [place] 336-845-3988 Admissions: 8am-3pm M-F  °[facility] [address]    °[place], [place] 336-841-1104   °[facility] [address], [place], [place] 336-379-7146   °[facility] [address]  °[place], [place] 336-285-9073   °[facility] Intensive Outpatient [address], [place], [place] 336-852-3033   °[facility] [address]  °[place], [place] 1-877-615-2722 or 336-784-9470   °[facility] [address] [place], [place] 336-227-7417 Accepts Medicaid  °[facility] [address]  °[place] 1-800-659-3381 Substance Abuse/Addiction Treatment  ° °[place] Behavioral Health Resources °Organization         Address  Phone  Notes  °[facility]  (888) 581-9988   °Julie Brannon, PhD [address] [place], [place]   (336) 349-5553 or (336) 951-0000   °[facility]   [address] °[place], [place] (336) 349-4454   °[facility] [address], [place], [place] (336) 342-8316 Insurance/Medicaid/sponsorship through [facility]  °[facility] [address]                                    [place], [place] (336) 342-8316 Therapy/tele-psych/case    °[facility] [address]  ° [place],  (336) 349-2233    °Dr. Arfeen  (336) 349-4544   °[facility] [place]  [employer] [place] [facility] 1) [address], [place] °2) [address], [place] °3)  [address], [place] (336) 349-3220 °(336) 342-7768 ° °(336) 342-8140   °[place] [facility] (336) 342-1394 or (336) 342-3537 (After Hours)    ° ° °

## 2015-03-19 NOTE — ED Notes (Signed)
Pt reports hx avascular necrosis and having pain to R hip. Pt fell last night making pain worse. Pt also reports continued sx from pneumonia despite taking prescribed antibiotics.

## 2015-03-19 NOTE — ED Notes (Signed)
Pt a/o x 4 on d/c in wheelchair. 

## 2015-03-19 NOTE — ED Provider Notes (Signed)
CSN: [id number]     Arrival date & time 03/19/15  1353 History   First MD Initiated Contact with Patient 03/19/15 1557     Chief Complaint  Patient presents with  . Hip Pain  . Cough     (Consider location/radiation/quality/duration/timing/severity/associated sxs/prior Treatment) Patient is a 54 y.o. male presenting with hip pain.  Hip Pain This is a chronic problem. The current episode started more than 1 year ago. The problem occurs constantly. The problem has been gradually worsening. Associated symptoms include coughing, fatigue and weakness. Pertinent negatives include no abdominal pain, chest pain, nausea or visual change. Nothing aggravates the symptoms. He has tried nothing for the symptoms. The treatment provided no relief.    Past Medical History  Diagnosis Date  . Hypertension   . Arthritis   . Knee fracture   . Groin injury   . Ankle injury   . Avascular necrosis    History reviewed. No pertinent past surgical history. No family history on file. History  Substance Use Topics  . Smoking status: Never Smoker   . Smokeless tobacco: Never Used  . Alcohol Use: Yes     Comment: occ    Review of Systems  Constitutional: Positive for fatigue.  Respiratory: Positive for cough.   Cardiovascular: Negative for chest pain.  Gastrointestinal: Negative for nausea and abdominal pain.  Neurological: Positive for weakness.  All other systems reviewed and are negative.     Allergies  Review of patient's allergies indicates no known allergies.  Home Medications   Prior to Admission medications   Medication Sig Start Date End Date Taking? Authorizing Provider  azithromycin (ZITHROMAX Z-PAK) 250 MG tablet Take two tabs the first day and one tablet each day after 03/11/15  Yes Silas Flood, MD  acetaminophen-codeine (TYLENOL #3) 300-30 MG per tablet Take 1-2 tablets by mouth every 8 (eight) hours as needed for moderate pain. Patient not taking: Reported on 02/15/2015 01/29/15    Ambrose Finland, NP  naproxen (NAPROSYN) 500 MG tablet Take 1 tablet (500 mg total) by mouth 2 (two) times daily. Patient not taking: Reported on 01/01/2015 11/25/14   Fayrene Helper, PA-C  traMADol (ULTRAM) 50 MG tablet Take 1 tablet (50 mg total) by mouth every 12 (twelve) hours as needed. Patient not taking: Reported on 02/15/2015 01/10/15   Ambrose Finland, NP   BP 114/82 mmHg  Pulse 100  Temp(Src) 98.4 F (36.9 C) (Oral)  Resp 16  Ht 5\' 10"  (1.778 m)  Wt 170 lb (77.111 kg)  BMI 24.39 kg/m2  SpO2 97% Physical Exam  Constitutional: He is oriented to person, place, and time. He appears well-developed and well-nourished. No distress.  Chronically ill-appearing male in no apparent distress.  HENT:  Head: Normocephalic.  Mouth/Throat: Oropharynx is clear and moist. No oropharyngeal exudate.  contusion to right frontal forehead.  Eyes: Conjunctivae and EOM are normal. Pupils are equal, round, and reactive to light.  Neck: Normal range of motion. Neck supple.  Cardiovascular: Normal rate, regular rhythm, normal heart sounds and intact distal pulses.  Exam reveals no gallop and no friction rub.   No murmur heard. Pulmonary/Chest: Effort normal and breath sounds normal. No respiratory distress. He has no wheezes. He has no rales.  No focal breath sounds.  Abdominal: Soft. He exhibits no distension and no mass. There is no tenderness. There is no rebound and no guarding.  Musculoskeletal: Normal range of motion. He exhibits no edema or tenderness.  Pain with increased range of  motion of right hip. Able to bear weight but difficulty with ambulation. Neurovascularly intact. No obvious overlying bruising or deformity.  Lymphadenopathy:    He has no cervical adenopathy.  Neurological: He is alert and oriented to person, place, and time. He has normal strength. No cranial nerve deficit or sensory deficit. Coordination normal.  Skin: Skin is warm and dry. No rash noted. He is not diaphoretic.   Psychiatric: He has a normal mood and affect. His behavior is normal. Judgment and thought content normal.  Nursing note and vitals reviewed.   ED Course  Procedures (including critical care time) Labs Review Labs Reviewed  CBC WITH DIFFERENTIAL/PLATELET - Abnormal; Notable for the following:    RBC 3.78 (*)    Hemoglobin 12.0 (*)    HCT 35.3 (*)    Monocytes Relative 15 (*)    All other components within normal limits  BASIC METABOLIC PANEL    Imaging Review Ct Head Wo Contrast  03/19/2015   CLINICAL DATA:  Fall, forehead abrasion  EXAM: CT HEAD WITHOUT CONTRAST  TECHNIQUE: Contiguous axial images were obtained from the base of the skull through the vertex without intravenous contrast.  COMPARISON:  08/22/2014  FINDINGS: No skull fracture. Minimal scalp swelling and subcutaneous stranding midline frontal region. Paranasal sinuses and mastoid air cells are unremarkable.  No intracranial hemorrhage, mass effect or midline shift. No acute infarction. No mass lesion is noted on this unenhanced scan.  IMPRESSION: No acute intracranial abnormality. Mild scalp swelling and subcutaneous stranding midline frontal region.   Electronically Signed   By: Natasha Mead M.D.   On: 03/19/2015 19:07   Dg Hip Unilat With Pelvis 2-3 Views Right  03/19/2015   CLINICAL DATA:  Larey Seat 30 steps at home last night, RIGHT hip pain  EXAM: RIGHT HIP (WITH PELVIS) 2-3 VIEWS  COMPARISON:  11/19/2014, 02/15/2015  FINDINGS: Osseous demineralization.  SI joints symmetric and preserved.  LEFT hip joint space preserved.  Narrowing and slight irregularity of the RIGHT hip joint.  Cystic changes at the RIGHT femoral head with superior compression deformity/impaction of the femoral head.  Changes are consistent with avascular necrosis and mild superior articular collapse, little changed from previous exam.  No new fracture, dislocation or bone destruction.  Pelvis appears intact.  IMPRESSION: Avascular necrosis of the RIGHT femoral  head with partial collapse of the superior portion of the RIGHT femoral head, little changed since previous study.  No new osseous abnormalities.   Electronically Signed   By: Ulyses Southward M.D.   On: 03/19/2015 17:15     EKG Interpretation None      MDM   Final diagnoses:  Right hip pain  Avascular necrosis of right femoral head  Scalp contusion, initial encounter    54 year old male with chronic right hip pain from avascular necrosis presents with right hip pain as well as headache following a fall. Fell last night around 3-4 steps. Bruise to his forehead. States did not lose consciousness. No neurologic deficits on my exam. Also increased pain to his right hip with more difficulty with ambulation. He is able to bear weight on my exam. Also of note, the patient ran out of his narcotic pain medications 2 days ago.  Afebrile, not hypotensive, slightly tachycardic on my exam. She is being treated for pneumonia though his respiratory symptoms have improved. I feel his pain is likely secondary to his chronic pain and lack of narcotics. Despite that, he did have a new injury within the last 24  hours with obvious trauma above the neck. I will obtain a CT head as well as plain film of his right hip and basic labs. One dose of pain medications given in the emergency department and I discussed my inability to treat his chronic pain in the outpatient setting without new findings. He voiced understanding and if workup is negative will follow up with his PCP, orthopedic, or chronic pain specialist for further management of his chronic pain.  Traumatic workup negative. Laboratory workup reassuring. Discussed follow-up with orthopedics for his avascular necrosis as well as PCP for chronic pain specialist for his pain management.  Dorna Leitz, MD 03/19/15 Prentice Docker  Gerhard Munch, MD 03/19/15 [phone]

## 2015-03-19 NOTE — ED Notes (Signed)
Pt c/o chronic pain to R hip, worsening pain with weight bearing. Pt has crutch to help with ambulation. 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 Reports running out of pain medication. Also reports falling down several steps last night and has increased pain. Denies LOC. Abrasion noted to forehead. Pt recently diagnosed with pneumonia in which he took his last antibiotic today. Pt reports feeling better with antibiotics, still feels fatigued

## 2015-03-19 NOTE — ED Notes (Signed)
Pt also reporting decreased energy.

## 2015-03-31 ENCOUNTER — Ambulatory Visit: Payer: Medicaid Other | Attending: Internal Medicine | Admitting: Internal Medicine

## 2015-03-31 ENCOUNTER — Encounter: Payer: Self-pay | Admitting: Internal Medicine

## 2015-03-31 VITALS — BP 134/85 | HR 101 | Temp 98.3°F | Resp 16 | Ht 70.0 in | Wt 170.0 lb

## 2015-03-31 DIAGNOSIS — J189 Pneumonia, unspecified organism: Secondary | ICD-10-CM

## 2015-03-31 DIAGNOSIS — M87051 Idiopathic aseptic necrosis of right femur: Secondary | ICD-10-CM

## 2015-03-31 DIAGNOSIS — M87851 Other osteonecrosis, right femur: Secondary | ICD-10-CM | POA: Insufficient documentation

## 2015-03-31 NOTE — Progress Notes (Signed)
Pt is here following up on his arthritis and his HTN. Pt reports feeling very tired and weak. Pt needs his medications refilled.

## 2015-03-31 NOTE — Progress Notes (Signed)
Patient ID: Sean Mccoy, male   DOB: April 10, 1961, 54 y.o.   MRN: [id number]  CC: ED f/u  HPI: Sean Mccoy is a 54 y.o. male here today for a follow up visit.  Patient has past medical history of avascular necrosis of right hip and hypertension.  Patient presents to clinic today to discuss his recent hospital visit. He was diagnosed with community acquired pneumonia on 03/11/15. He has since completed all antibiotics and wants to be checked to make sure the disease is cleared. Patient states that he has rhinitis, sneezing, itchy nose, and cough. He denies any fevers, chills, chest pain, or SOB.  He does note that he is considerably weaker since diagnosed.  Patient was told that he will need a hip replacement surgery soon but states that he wants to get approved for disability first. He does continue to work a part time job at the [employer]. He is requesting a refill of pain medication.   No Known Allergies Past Medical History  Diagnosis Date  . Hypertension   . Arthritis   . Knee fracture   . Groin injury   . Ankle injury   . Avascular necrosis    Current Outpatient Prescriptions on File Prior to Visit  Medication Sig Dispense Refill  . acetaminophen-codeine (TYLENOL #3) 300-30 MG per tablet Take 1-2 tablets by mouth every 8 (eight) hours as needed for moderate pain. (Patient not taking: Reported on 02/15/2015) 90 tablet 0  . azithromycin (ZITHROMAX Z-PAK) 250 MG tablet Take two tabs the first day and one tablet each day after (Patient not taking: Reported on 03/31/2015) 6 tablet 0  . naproxen (NAPROSYN) 500 MG tablet Take 1 tablet (500 mg total) by mouth 2 (two) times daily. (Patient not taking: Reported on 01/01/2015) 30 tablet 0  . traMADol (ULTRAM) 50 MG tablet Take 1 tablet (50 mg total) by mouth every 12 (twelve) hours as needed. (Patient not taking: Reported on 02/15/2015) 60 tablet 0   No current facility-administered medications on file prior to visit.   History reviewed. No  pertinent family history. History   Social History  . Marital Status: Single    Spouse Name: N/A  . Number of Children: N/A  . Years of Education: N/A   Occupational History  . Not on file.   Social History Main Topics  . Smoking status: Never Smoker   . Smokeless tobacco: Never Used  . Alcohol Use: Yes     Comment: occ  . Drug Use: No  . Sexual Activity: Yes    Birth Control/ Protection: Condom   Other Topics Concern  . Not on file   Social History Narrative    Review of Systems: See HPI   Objective:   Filed Vitals:   03/31/15 1616  BP: 134/85  Pulse: 101  Temp: 98.3 F (36.8 C)  Resp: 16    Physical Exam  Constitutional: He is oriented to person, place, and time.  Cardiovascular: Normal rate, regular rhythm and normal heart sounds.   Pulmonary/Chest: Effort normal and breath sounds normal.  Musculoskeletal: He exhibits tenderness. He exhibits no edema.  Pain with increased range of motion of right hip. Able to bear weight but difficulty with ambulation, uses cane. .   Neurological: He is alert and oriented to person, place, and time.  Skin: Skin is warm and dry.     Lab Results  Component Value Date   WBC 5.3 03/19/2015   HGB 12.0* 03/19/2015   HCT 35.3* 03/19/2015  MCV 93.4 03/19/2015   PLT 339 03/19/2015   Lab Results  Component Value Date   CREATININE 0.96 03/19/2015   BUN 17 03/19/2015   NA 137 03/19/2015   K 4.3 03/19/2015   CL 100 03/19/2015   CO2 26 03/19/2015    Lab Results  Component Value Date   HGBA1C 4.8 01/01/2015   Lipid Panel     Component Value Date/Time   CHOL 207* 01/01/2015 1042   TRIG 54 01/01/2015 1042   HDL 90 01/01/2015 1042   CHOLHDL 2.3 01/01/2015 1042   VLDL 11 01/01/2015 1042   LDLCALC 106* 01/01/2015 1042       Assessment and plan:   Carron was seen today for follow-up.  Diagnoses and all orders for this visit:  CAP (community acquired pneumonia) Orders: -     DG Chest 2 View; Future Will do  chest xray to confirm resolution   Avascular necrosis of femoral head, right I have advised patient that I will not give him anymore pain medication at this time. I ran CSRS on patient and found that he was just given 40 Tramadol tablets on 4 days ago and previously had tons of other pain medications prescribed within a short time frame before that. I have advised him to not wait on surgery so that he can get to root of problem and fix pain source. I have explained potential of medication addiction and that I cannot keep writing several scripts for pain control.      Return if symptoms worsen or fail to improve.  Holland Commons, NP-C [facility] [phone] 03/31/2015, 5:10 PM

## 2015-04-14 ENCOUNTER — Telehealth: Payer: Self-pay | Admitting: Internal Medicine

## 2015-04-14 NOTE — Telephone Encounter (Signed)
Patient called to request a med refill for traMADol (ULTRAM) 50 MG tablet. Please f/u with pt. °

## 2015-04-21 ENCOUNTER — Telehealth: Payer: Self-pay | Admitting: Internal Medicine

## 2015-04-21 NOTE — Telephone Encounter (Signed)
Patient has come in today to request a medication refill for Tramadol; please f/u with patient about this

## 2015-04-23 ENCOUNTER — Telehealth: Payer: Self-pay | Admitting: Internal Medicine

## 2015-04-23 NOTE — Telephone Encounter (Signed)
Patient called to request a med refill for traMADol (ULTRAM) 50 MG tablet. Please f/u with pt. °

## 2015-04-24 NOTE — Telephone Encounter (Signed)
Patient called to request a med refill for traMADol (ULTRAM) 50 MG tablet. Please f/u with pt. °

## 2015-04-28 ENCOUNTER — Other Ambulatory Visit: Payer: Self-pay | Admitting: Orthopaedic Surgery

## 2015-04-28 DIAGNOSIS — M25551 Pain in right hip: Secondary | ICD-10-CM

## 2015-04-29 ENCOUNTER — Encounter (HOSPITAL_COMMUNITY): Payer: Self-pay

## 2015-04-29 ENCOUNTER — Emergency Department (HOSPITAL_COMMUNITY)
Admission: EM | Admit: 2015-04-29 | Discharge: 2015-04-29 | Disposition: A | Discharge status: Home / Self Care | Payer: Medicaid Other | Attending: Emergency Medicine | Admitting: Emergency Medicine

## 2015-04-29 DIAGNOSIS — Z87828 Personal history of other (healed) physical injury and trauma: Secondary | ICD-10-CM | POA: Insufficient documentation

## 2015-04-29 DIAGNOSIS — G8929 Other chronic pain: Secondary | ICD-10-CM

## 2015-04-29 DIAGNOSIS — R21 Rash and other nonspecific skin eruption: Secondary | ICD-10-CM | POA: Diagnosis present

## 2015-04-29 DIAGNOSIS — Z8781 Personal history of (healed) traumatic fracture: Secondary | ICD-10-CM | POA: Diagnosis not present

## 2015-04-29 DIAGNOSIS — Z791 Long term (current) use of non-steroidal anti-inflammatories (NSAID): Secondary | ICD-10-CM | POA: Diagnosis not present

## 2015-04-29 DIAGNOSIS — I10 Essential (primary) hypertension: Secondary | ICD-10-CM | POA: Diagnosis not present

## 2015-04-29 DIAGNOSIS — M25551 Pain in right hip: Secondary | ICD-10-CM | POA: Insufficient documentation

## 2015-04-29 DIAGNOSIS — Z792 Long term (current) use of antibiotics: Secondary | ICD-10-CM | POA: Diagnosis not present

## 2015-04-29 DIAGNOSIS — L237 Allergic contact dermatitis due to plants, except food: Secondary | ICD-10-CM | POA: Insufficient documentation

## 2015-04-29 MED ORDER — HYDROCORTISONE 2.5 % EX LOTN: TOPICAL_LOTION | Freq: Two times a day (BID) | CUTANEOUS | Status: DC

## 2015-04-29 MED ORDER — PREDNISONE 20 MG PO TABS: ORAL_TABLET | ORAL | Status: DC

## 2015-04-29 MED ORDER — PREDNISONE 20 MG PO TABS
60.0000 mg | ORAL_TABLET | Freq: Once | ORAL | Status: AC
  Administered 2015-04-29: 60 mg via ORAL
  Filled 2015-04-29: qty 3

## 2015-04-29 NOTE — ED Notes (Signed)
Poison Ivy to his rt. Arm and rt. Side head. And ears.   Sean Mccoy is seeping, burning. .  Pt. is also hear for pain medication for his rt. Hip.  Pt. Needs rt.hip replacement. GCS 15

## 2015-04-29 NOTE — ED Provider Notes (Signed)
CSN: [id number]     Arrival date & time 04/29/15  1523 History   This chart was scribed for non-physician practitioner, Dierdre Forth, PA-C working with Tilden Fossa, MD by Gwenyth Ober, ED scribe. This patient was seen in room TR09C/TR09C and the patient's care was started at 4:29 PM   Chief Complaint  Patient presents with  . Poison Ivy   The history is provided by the patient. No language interpreter was used.   HPI Comments: Sean Mccoy is a 54 y.o. male with a history of HTN, arthritis, ETOH abuse, polysubstance abuse and avascular necrosis who presents to the Emergency Department complaining of a moderate poison ivy rash, with associated moderate pain, to his right arm and the posterior aspect of his head and neck that started 3 days ago. He reports he was in contact with poison ivy just before the rash began.  He has tried applying Chamomile lotion and baking soda with no relief. Pt denies itchiness but reports the area is irritated and sore.  He denies purulent drainage, fevers, chills.  He denies facial or genital involvement of the rash.    Pt also complains of constant, moderate chronic right hip pain. He has tried Tramadol with no relief. Pt reports a history of avascular necrosis which was previously treated with Percocet. He notes that he ran out of Percocet, but is not followed by a provider to refill the prescription. Pt denies any new injuries.  He is able to walk with pain.    Past Medical History  Diagnosis Date  . Hypertension   . Arthritis   . Knee fracture   . Groin injury   . Ankle injury   . Avascular necrosis    History reviewed. No pertinent past surgical history. No family history on file. History  Substance Use Topics  . Smoking status: Never Smoker   . Smokeless tobacco: Never Used  . Alcohol Use: Yes     Comment: occ    Review of Systems  Constitutional: Negative for fever, diaphoresis, appetite change, fatigue and unexpected weight change.   HENT: Negative for mouth sores.   Eyes: Negative for visual disturbance.  Respiratory: Negative for cough, chest tightness, shortness of breath and wheezing.   Cardiovascular: Negative for chest pain.  Gastrointestinal: Negative for nausea, vomiting, abdominal pain, diarrhea and constipation.  Endocrine: Negative for polydipsia, polyphagia and polyuria.  Genitourinary: Negative for dysuria, urgency, frequency and hematuria.  Musculoskeletal: Positive for arthralgias. Negative for back pain and neck stiffness.  Skin: Positive for rash.  Allergic/Immunologic: Negative for immunocompromised state.  Neurological: Negative for syncope, light-headedness and headaches.  Hematological: Does not bruise/bleed easily.  Psychiatric/Behavioral: Negative for sleep disturbance. The patient is not nervous/anxious.   All other systems reviewed and are negative.  Allergies  Review of patient's allergies indicates no known allergies.  Home Medications   Prior to Admission medications   Medication Sig Start Date End Date Taking? Authorizing Provider  acetaminophen-codeine (TYLENOL #3) 300-30 MG per tablet Take 1-2 tablets by mouth every 8 (eight) hours as needed for moderate pain. Patient not taking: Reported on 02/15/2015 01/29/15   Ambrose Finland, NP  azithromycin (ZITHROMAX Z-PAK) 250 MG tablet Take two tabs the first day and one tablet each day after Patient not taking: Reported on 03/31/2015 03/11/15   Silas Flood, MD  hydrocortisone 2.5 % lotion Apply topically 2 (two) times daily. 04/29/15   Keiland Pickering, PA-C  naproxen (NAPROSYN) 500 MG tablet Take 1 tablet (500 mg total)  by mouth 2 (two) times daily. Patient not taking: Reported on 01/01/2015 11/25/14   Fayrene Helper, PA-C  predniSONE (DELTASONE) 20 MG tablet 3 tabs po daily x 3 days, then 2 tabs x 3 days, then 1.5 tabs x 3 days, then 1 tab x 3 days, then 0.5 tabs x 3 days 04/29/15   Dahlia Client Salil Raineri, PA-C  traMADol (ULTRAM) 50 MG tablet Take 1  tablet (50 mg total) by mouth every 12 (twelve) hours as needed. Patient not taking: Reported on 02/15/2015 01/10/15   Ambrose Finland, NP   BP 126/87 mmHg  Pulse 97  Temp(Src) 98.4 F (36.9 C) (Oral)  Resp 18  Ht 5\' 10"  (1.778 m)  Wt 170 lb (77.111 kg)  BMI 24.39 kg/m2  SpO2 96% Physical Exam  Constitutional: He is oriented to person, place, and time. He appears well-developed and well-nourished. No distress.  HENT:  Head: Normocephalic and atraumatic.  Right Ear: Tympanic membrane, external ear and ear canal normal.  Left Ear: Tympanic membrane, external ear and ear canal normal.  Nose: Nose normal. No mucosal edema or rhinorrhea.  Mouth/Throat: Uvula is midline. No uvula swelling. No oropharyngeal exudate, posterior oropharyngeal edema, posterior oropharyngeal erythema or tonsillar abscesses.  No swelling of the uvula or oropharynx   Eyes: Conjunctivae are normal.  Neck: Normal range of motion.  Patent airway No stridor; normal phonation Handling secretions without difficulty  Cardiovascular: Normal rate, normal heart sounds and intact distal pulses.   No murmur heard. Pulmonary/Chest: Effort normal and breath sounds normal. No stridor. No respiratory distress. He has no wheezes.  No wheezes or rhonchi  Abdominal: Soft. Bowel sounds are normal. There is no tenderness.  Musculoskeletal: Normal range of motion. He exhibits no edema.  Slightly decreased ROM of the right hip  Neurological: He is alert and oriented to person, place, and time.  Pt ambulates with assistance of a crutch and minor limp, but is able to weight bear and had normal balance.  Sensation intact.    Skin: Skin is warm and dry. Rash noted. He is not diaphoretic.  Vesicular rash with erythema and weeping, no ulcerations noted Mild excoriations - no induration or fluctuance to indicate secondary infection  Psychiatric: He has a normal mood and affect.  Nursing note and vitals reviewed.   ED Course  Procedures    DIAGNOSTIC STUDIES: Oxygen Saturation is 96% on RA, normal by my interpretation.    COORDINATION OF CARE: 4:33 PM Discussed treatment plan with pt. He agreed to plan. Advised pt to return with hardening of rash or purulent drainage.  Labs Review Labs Reviewed - No data to display  Imaging Review No results found.   EKG Interpretation None      MDM   Final diagnoses:  Chronic right hip pain  Poison ivy   Baran Kuhrt presents with presentation consistant with poison ivy infection. Discussed contagiousness & home care. Treated in ED with prednisone. DC with recommendation to get OTC Zanfel. Script for PO prednisone taper. Strict return precautions discussed. Pt afebrile and in NAD prior to dc. No induration or purulent drainage to indicate secondary infection. Airway intact without compromise. No facial or genital involvement of rash.   Patient also with chronic right hip pain.  No trauma, falls.  Pt with base line pain, but is out of percocet.  Discussed with patient that we will be unable to write prescriptions for products for his chronic pain, but recommend close follow-up with his primary care physician for this.  BP 126/87 mmHg  Pulse 97  Temp(Src) 98.4 F (36.9 C) (Oral)  Resp 18  Ht 5\' 10"  (1.778 m)  Wt 170 lb (77.111 kg)  BMI 24.39 kg/m2  SpO2 96%  I personally performed the services described in this documentation, which was scribed in my presence. The recorded information has been reviewed and is accurate.    Dierdre Forth, PA-C 04/29/15 1706  Amaro Mangold, PA-C 04/29/15 1706  Tilden Fossa, MD 04/29/15 1723

## 2015-04-29 NOTE — Discharge Instructions (Signed)
1. Medications: Prednisone, hydrocortisone lotion, usual home medications 2. Treatment: rest, drink plenty of fluids, take medications as prescribed, tylenol for hip pain 3. Follow Up: Please followup with your primary doctor in 2 days for discussion of your diagnoses and further evaluation after today's visit; if you do not have a primary care doctor use the resource guide provided to find one; followup with dermatology as needed; Return to the ER for difficulty breathing, signs of infection, fevers or other concerning symptoms  Poison [facility] ivy is a inflammation of the skin (contact dermatitis) caused by touching the allergens on the leaves of the ivy plant following previous exposure to the plant. The rash usually appears 48 hours after exposure. The rash is usually bumps (papules) or blisters (vesicles) in a linear pattern. Depending on your own sensitivity, the rash may simply cause redness and itching, or it may also progress to blisters which may break open. These must be well cared for to prevent secondary bacterial (germ) infection, followed by scarring. Keep any open areas dry, clean, dressed, and covered with an antibacterial ointment if needed. The eyes may also get puffy. The puffiness is worst in the morning and gets better as the day progresses. This dermatitis usually heals without scarring, within 2 to 3 weeks without treatment. HOME CARE INSTRUCTIONS  Thoroughly wash with soap and water as soon as you have been exposed to poison ivy. You have about one half hour to remove the plant resin before it will cause the rash. This washing will destroy the oil or antigen on the skin that is causing, or will cause, the rash. Be sure to wash under your fingernails as any plant resin there will continue to spread the rash. Do not rub skin vigorously when washing affected area. Poison ivy cannot spread if no oil from the plant remains on your body. A rash that has progressed to weeping sores will  not spread the rash unless you have not washed thoroughly. It is also important to wash any clothes you have been wearing as these may carry active allergens. The rash will return if you wear the unwashed clothing, even several days later. Avoidance of the plant in the future is the best measure. Poison ivy plant can be recognized by the number of leaves. Generally, poison ivy has three leaves with flowering branches on a single stem. Diphenhydramine may be purchased over the counter and used as needed for itching. Do not drive with this medication if it makes you drowsy.Ask your caregiver about medication for children. SEEK MEDICAL CARE IF:  Open sores develop.  Redness spreads beyond area of rash.  You notice purulent (pus-like) discharge.  You have increased pain.  Other signs of infection develop (such as fever). Document Released: 12/10/2000 Document Revised: 03/06/2012 Document Reviewed: 05/23/2009 [facility] Patient Information 2015 [place], [place]. This information is not intended to replace advice given to you by your health care provider. Make sure you discuss any questions you have with your health care provider.

## 2015-04-30 ENCOUNTER — Ambulatory Visit
Admission: RE | Admit: 2015-04-30 | Discharge: 2015-04-30 | Disposition: A | Discharge status: Home / Self Care | Payer: Medicaid Other | Source: Ambulatory Visit | Attending: Orthopaedic Surgery | Admitting: Orthopaedic Surgery

## 2015-04-30 DIAGNOSIS — M25551 Pain in right hip: Secondary | ICD-10-CM

## 2015-04-30 MED ORDER — METHYLPREDNISOLONE ACETATE 40 MG/ML INJ SUSP (RADIOLOG
120.0000 mg | Freq: Once | INTRAMUSCULAR | Status: AC
  Administered 2015-04-30: 120 mg via INTRA_ARTICULAR

## 2015-04-30 MED ORDER — IOHEXOL 180 MG/ML  SOLN
1.0000 mL | Freq: Once | INTRAMUSCULAR | Status: AC | PRN
  Administered 2015-04-30: 1 mL via INTRA_ARTICULAR

## 2015-05-12 NOTE — Telephone Encounter (Signed)
Patient called to request a med refill for Tramadol, please f/u with pt. °

## 2015-05-23 ENCOUNTER — Telehealth: Payer: Self-pay | Admitting: Internal Medicine

## 2015-05-23 NOTE — Telephone Encounter (Signed)
Patient called to request a med refill for Tramadol, please f/u with pt. °

## 2015-06-02 ENCOUNTER — Encounter: Payer: Self-pay | Admitting: Internal Medicine

## 2015-06-02 ENCOUNTER — Ambulatory Visit: Payer: Medicaid Other | Attending: Internal Medicine | Admitting: Internal Medicine

## 2015-06-02 VITALS — BP 123/81 | HR 99 | Temp 97.6°F | Ht 66.0 in | Wt 164.0 lb

## 2015-06-02 DIAGNOSIS — M879 Osteonecrosis, unspecified: Secondary | ICD-10-CM

## 2015-06-02 DIAGNOSIS — M25551 Pain in right hip: Secondary | ICD-10-CM

## 2015-06-02 DIAGNOSIS — G8929 Other chronic pain: Secondary | ICD-10-CM | POA: Diagnosis not present

## 2015-06-02 DIAGNOSIS — M8700 Idiopathic aseptic necrosis of unspecified bone: Secondary | ICD-10-CM

## 2015-06-02 MED ORDER — TRAMADOL HCL 50 MG PO TABS
50.0000 mg | ORAL_TABLET | Freq: Two times a day (BID) | ORAL | Status: DC | PRN
Start: 2015-06-02 — End: 2016-01-09

## 2015-06-02 NOTE — Progress Notes (Signed)
Patient would like a refill on Tramadol Patient would like referral to pain management Patient states he needs right hip replacement and has applied for disability but has not received it yet.  He states he is trying to work to keep from being homeless but it is very difficult for him Patient is feeling depressed and anxious and is filling out the screening tool

## 2015-06-02 NOTE — Progress Notes (Signed)
Patient ID: Sean Mccoy, male   DOB: 1961-02-14, 54 y.o.   MRN: [id number]  CC: chronic hip pain   HPI: Sean Mccoy is a 54 y.o. male here today for a follow up visit.  Patient has past medical history of avascular necrosis of right hip and hypertension. Patient was told that he will need a hip replacement surgery soon but states that he wants to get approved for disability first. He does continue to work a part time job at the [employer], [employer]. He does not want to have the hip replacement yet because then he will be unable to work. He notes that working is the only thing that is keeping him from being homeless again. He is requesting a refill of pain medication.   Patient has No headache, No chest pain, No abdominal pain - No Nausea, No new weakness tingling or numbness, No Cough - SOB.  No Known Allergies Past Medical History  Diagnosis Date  . Hypertension   . Arthritis   . Knee fracture   . Groin injury   . Ankle injury   . Avascular necrosis    No current outpatient prescriptions on file prior to visit.   No current facility-administered medications on file prior to visit.   History reviewed. No pertinent family history. History   Social History  . Marital Status: Single    Spouse Name: N/A  . Number of Children: N/A  . Years of Education: N/A   Occupational History  . Not on file.   Social History Main Topics  . Smoking status: Never Smoker   . Smokeless tobacco: Never Used  . Alcohol Use: 1.2 oz/week    2 Cans of beer per week     Comment: occ  . Drug Use: No  . Sexual Activity: Yes    Birth Control/ Protection: Condom   Other Topics Concern  . Not on file   Social History Narrative    Review of Systems: See HPI   Objective:   Filed Vitals:   06/02/15 1537  BP: 123/81  Pulse: 99  Temp: 97.6 F (36.4 C)    Physical Exam  Cardiovascular: Normal rate, regular rhythm and normal heart sounds.   Pulmonary/Chest: Effort normal and breath sounds  normal.  Musculoskeletal:  Abnormal gait, pain with internal and external rotation of right hip      Lab Results  Component Value Date   WBC 5.3 03/19/2015   HGB 12.0* 03/19/2015   HCT 35.3* 03/19/2015   MCV 93.4 03/19/2015   PLT 339 03/19/2015   Lab Results  Component Value Date   CREATININE 0.96 03/19/2015   BUN 17 03/19/2015   NA 137 03/19/2015   K 4.3 03/19/2015   CL 100 03/19/2015   CO2 26 03/19/2015    Lab Results  Component Value Date   HGBA1C 4.8 01/01/2015   Lipid Panel     Component Value Date/Time   CHOL 207* 01/01/2015 1042   TRIG 54 01/01/2015 1042   HDL 90 01/01/2015 1042   CHOLHDL 2.3 01/01/2015 1042   VLDL 11 01/01/2015 1042   LDLCALC 106* 01/01/2015 1042       Assessment and plan:   Sean Mccoy was seen today for chronic hip pain.  Diagnoses and all orders for this visit:  AVN (avascular necrosis of bone) Orders: -     Ambulatory referral to Pain Clinic  Chronic right hip pain Orders: -     Ambulatory referral to Pain Clinic -  traMADol (ULTRAM) 50 MG tablet; Take 1 tablet (50 mg total) by mouth every 12 (twelve) hours as needed. Patient reports that he has not had pain medication in "months" but review of the Controlled Substance reporting site reveals that patient was given 1 months worth of Norco 5-325 on 4/28. I explained to him that he should have ran out of medication 9 days ago. He then reports that he was just exaggerating. I have also explained to patient that even if he is accepted into a pain clinic that he will likely be told that he needs to have his hip replaced instead of managed with highly addictive narcotic pain medication. I will only give him 15 days worth of Tramadol due to chance of overuse.     Return if symptoms worsen or fail to improve.  Holland Commons, NP-C [facility] [phone] 06/02/2015, 4:06 PM

## 2015-06-05 ENCOUNTER — Telehealth: Payer: Self-pay | Admitting: Clinical

## 2015-06-05 NOTE — Telephone Encounter (Signed)
Left HIPPA-compliant message 

## 2015-06-09 ENCOUNTER — Encounter (HOSPITAL_COMMUNITY): Payer: Self-pay | Admitting: Family Medicine

## 2015-06-09 ENCOUNTER — Emergency Department (HOSPITAL_COMMUNITY)
Admission: EM | Admit: 2015-06-09 | Discharge: 2015-06-09 | Disposition: A | Discharge status: Home / Self Care | Payer: Medicaid Other | Attending: Emergency Medicine | Admitting: Emergency Medicine

## 2015-06-09 DIAGNOSIS — Z8781 Personal history of (healed) traumatic fracture: Secondary | ICD-10-CM | POA: Insufficient documentation

## 2015-06-09 DIAGNOSIS — M8788 Other osteonecrosis, other site: Secondary | ICD-10-CM | POA: Diagnosis not present

## 2015-06-09 DIAGNOSIS — M25551 Pain in right hip: Secondary | ICD-10-CM | POA: Diagnosis present

## 2015-06-09 DIAGNOSIS — M8700 Idiopathic aseptic necrosis of unspecified bone: Secondary | ICD-10-CM

## 2015-06-09 DIAGNOSIS — G8929 Other chronic pain: Secondary | ICD-10-CM | POA: Diagnosis not present

## 2015-06-09 DIAGNOSIS — I10 Essential (primary) hypertension: Secondary | ICD-10-CM | POA: Diagnosis not present

## 2015-06-09 NOTE — ED Notes (Signed)
Pt here for chronic hip pain. sts he needs crutches and pain meds

## 2015-06-09 NOTE — Discharge Instructions (Signed)
Avascular Necrosis  Avascular necrosis is a disease resulting from the temporary or permanent loss of the blood supply to the bones. Without blood, the bone tissue dies and causes the bone to become soft. If the process involves the bone near a joint, it may lead to collapse of the joint surface. This disease is also known as:   Osteonecrosis.   Aseptic necrosis.   Ischemic bone necrosis.  Avascular necrosis most commonly affects the ends (epiphysis) of long bones. The femur, the bone extending from the knee joint to the hip joint, is the bone most commonly involved. The disease may affect 1 bone, more than 1 bone at the same time, more than 1 bone at different times. It affects men and women equally. Avascular necrosis occurs at any age. But it is more common between the ages of 30 and 50 years.  SYMPTOMS   In early stages patients may not have any symptoms. But as the disease progresses, joint pain generally develops. At first there is pain when putting weight on the affected joint, and then when resting. Pain usually develops gradually. It may be mild or severe. As the disease progresses and the bone and surrounding joint surface collapses, pain may develop or increase dramatically. Pain may be severe enough to limit range of motion in the affected joint. The period of time between the first symptoms and loss of joint function is different for each patient. This can range from several months to more than a year. Disability depends on:   What part of the bone is affected.   How large an area is involved.   How effectively the bone repairs itself.   If other illnesses are present.   If you are being treated for cancer with medications (chemotherapy).   Radiation.   The cause of the avascular necrosis.  DIAGNOSIS   The diagnosis of aseptic necrosis is usually made by:   Taking a history.   Doing an exam.   Taking X-rays. (If X-rays are normal, an MRI may be required.)   Sometimes further blood work and  specialized studies may be necessary.  TREATMENT   Treatment for this disease is necessary to maintain joint function. If untreated, most patients will suffer severe pain and limitation in movement within 2 years. Several treatments are available that help prevent further bone and joint damage. They can also reduce pain. To determine the most appropriate treatment, the caregiver considers the following aspects of a patient's disease:   The age of the patient.   The stage of the disease (early or late).   The location and amount of bone affected. It may be a small or large area.   The underlying cause of avascular necrosis.  The goals in treatment are to:   Improve the patient's use of the affected joint.   Stop further damage to the bone.   Improve bone and joint survival.  Your caregiver may use one or more of the following treatments:   Reduced weight bearing. If avascular necrosis is diagnosed early, the caregiver may begin treatment by having the patient limit weight on the affected joint. The caregiver may recommend limiting activities or using crutches. In some cases, reduced weight bearing can slow the damage caused by the disease and permit natural healing. When combined with medication to reduce pain, reduced weight bearing can be an effective way to avoid or delay surgery for some patients. Most patients eventually will need surgery to reconstruct the joint.     Core decompression. Core decompression works best in people who are in the earliest stages of avascular necrosis, before the collapse of the joint. This procedure often can reduce pain and slow the progression of bone and joint destruction in these patients. This surgical procedure removes the inner layer of bone, which:   Reduces pressure within the bone.   Increases blood flow to the bone.   Allows more blood vessels to form.   Reduces pain.   Osteotomy. This surgical procedure re-shapes the bone to reduce stress on the affected area  of the joint. There is a lengthy recovery period. The patient's activities are very limited for 3 to 12 months after an osteotomy. This procedure is most effective for younger patients with advanced avascular necrosis, and those with a large area of affected bone.   Bone Graft. A bone graft may be used to support a joint after core decompression. Bone grafting is surgery that transplants healthy bone from one part of the patient, such as the leg, to the diseased area. Sometimes the bone is taken with it's blood vessels which are attached to local blood vessels near the area of bone collapse. This is called a vascularized bone graft. There is a lengthy recovery period after a bone graft, usually from 6 to 12 months. This procedure is technically complex.   Arthroplasty. Arthroplasty is also known as total joint replacement. Total joint replacement is used in late-stage avascular necrosis, and when the joint is deformed. In this surgery, the diseased joint is replaced with artificial parts. It may be recommended for people who are not good candidates for other treatments, such as patients who may not do well with repeated attempts to preserve the joint. Various types of replacements are available, and patients should discuss specific needs with their caregiver.  New treatments being tried include:   The use of medications.   Electrical stimulation.   Combination therapies to increase the growth of new bone and blood vessels.  Document Released: 06/04/2002 Document Revised: 03/06/2012 Document Reviewed: 02/20/2014  ExitCare Patient Information 2015 [employer]. This information is not intended to replace advice given to you by your health care provider. Make sure you discuss any questions you have with your health care provider.

## 2015-06-09 NOTE — ED Provider Notes (Signed)
CSN: [id number]     Arrival date & time 06/09/15  1527 History  This chart was scribed for non-physician practitioner, Teressa Lower, NP, working with Jerelyn Scott, MD, by Ronney Lion, ED Scribe. This patient was seen in room TR06C/TR06C and the patient's care was started at 3:40 PM.    Chief Complaint  Patient presents with  . Hip Pain   The history is provided by the patient. No language interpreter was used.    HPI Comments: Sean Mccoy is a 54 y.o. male with a PMHx of avascular necrosis, who presents to the Emergency Department complaining of constant, severe chronic hip pain. He states he has been "going through a lot" and is having working secondary to the pain. He states he needs crutches and pain medication. Patient has been regularly seeing his PCP Holland Commons, NP, who prescribed him Ultram that he has taken with no relief.   Past Medical History  Diagnosis Date  . Hypertension   . Arthritis   . Knee fracture   . Groin injury   . Ankle injury   . Avascular necrosis    History reviewed. No pertinent past surgical history. History reviewed. No pertinent family history. History  Substance Use Topics  . Smoking status: Never Smoker   . Smokeless tobacco: Never Used  . Alcohol Use: 1.2 oz/week    2 Cans of beer per week     Comment: occ    Review of Systems  Musculoskeletal: Positive for arthralgias (hip pain).  All other systems reviewed and are negative.     Allergies  Review of patient's allergies indicates no known allergies.  Home Medications   Prior to Admission medications   Medication Sig Start Date End Date Taking? Authorizing Provider  traMADol (ULTRAM) 50 MG tablet Take 1 tablet (50 mg total) by mouth every 12 (twelve) hours as needed. 06/02/15   Ambrose Finland, NP   BP 141/92 mmHg  Pulse 104  Temp(Src) 98 F (36.7 C)  Resp 18  SpO2 99% Physical Exam  Constitutional: He is oriented to person, place, and time. He appears well-developed and  well-nourished. No distress.  HENT:  Head: Normocephalic and atraumatic.  Eyes: Conjunctivae and EOM are normal.  Neck: Neck supple. No tracheal deviation present.  Cardiovascular: Normal rate.   Pulmonary/Chest: Effort normal. No respiratory distress.  Musculoskeletal: Normal range of motion.  Right hip pain with palpation. No redness or warmth  Neurological: He is alert and oriented to person, place, and time.  Skin: Skin is warm and dry.  Psychiatric: He has a normal mood and affect. His behavior is normal.  Nursing note and vitals reviewed.   ED Course  Procedures (including critical care time)  DIAGNOSTIC STUDIES: Oxygen Saturation is 99% on RA, normal by my interpretation.    COORDINATION OF CARE: 3:45 PM - Discussed treatment plan with pt at bedside which includes crutches. Discussed with pt that I cannot refill his chronic pain medication in the ED. Pt verbalized understanding and agreed to plan.  MDM   Final diagnoses:  AVN (avascular necrosis of bone)  Chronic pain   Pt given crutches and told that he needs to see pcp for pain management  I personally performed the services described in this documentation, which was scribed in my presence. The recorded information has been reviewed and is accurate.    Teressa Lower, NP 06/09/15 1553  Jerelyn Scott, MD 06/11/15 [phone]

## 2015-06-09 NOTE — ED Notes (Signed)
Pt verbalizes understanding of d/c instructions and denies any further needs at this time. 

## 2015-06-12 ENCOUNTER — Telehealth: Payer: Self-pay | Admitting: Internal Medicine

## 2015-06-12 NOTE — Telephone Encounter (Signed)
Please call patient and find out what he is referring to. I do not write disability letters, he will need that signed by his orthopedics who is treating his hip condition. If he is referring to a letter stating if he can work or not then I will write a letter stating that he can work sitting down.

## 2015-06-12 NOTE — Telephone Encounter (Signed)
Patient came into office requesting a letter for employment and disability benefits. Please f/u with patient

## 2015-06-17 NOTE — Telephone Encounter (Signed)
LMTCB

## 2015-06-17 NOTE — Telephone Encounter (Signed)
Pt will stop by today to discuss paper work.

## 2015-07-16 ENCOUNTER — Ambulatory Visit: Payer: Self-pay | Admitting: Internal Medicine

## 2015-07-16 ENCOUNTER — Encounter: Payer: Self-pay | Admitting: Internal Medicine

## 2015-07-16 ENCOUNTER — Encounter (HOSPITAL_COMMUNITY): Payer: Self-pay | Admitting: Family Medicine

## 2015-07-16 ENCOUNTER — Emergency Department (HOSPITAL_COMMUNITY)
Admission: EM | Admit: 2015-07-16 | Discharge: 2015-07-16 | Disposition: A | Discharge status: Home / Self Care | Payer: Medicaid Other | Attending: Emergency Medicine | Admitting: Emergency Medicine

## 2015-07-16 ENCOUNTER — Other Ambulatory Visit: Payer: Self-pay | Admitting: Internal Medicine

## 2015-07-16 DIAGNOSIS — M25551 Pain in right hip: Secondary | ICD-10-CM

## 2015-07-16 DIAGNOSIS — W1839XA Other fall on same level, initial encounter: Secondary | ICD-10-CM | POA: Diagnosis not present

## 2015-07-16 DIAGNOSIS — S79911A Unspecified injury of right hip, initial encounter: Secondary | ICD-10-CM | POA: Diagnosis present

## 2015-07-16 DIAGNOSIS — Y9289 Other specified places as the place of occurrence of the external cause: Secondary | ICD-10-CM | POA: Insufficient documentation

## 2015-07-16 DIAGNOSIS — S8991XA Unspecified injury of right lower leg, initial encounter: Secondary | ICD-10-CM | POA: Insufficient documentation

## 2015-07-16 DIAGNOSIS — Y9389 Activity, other specified: Secondary | ICD-10-CM | POA: Insufficient documentation

## 2015-07-16 DIAGNOSIS — Y990 Civilian activity done for income or pay: Secondary | ICD-10-CM | POA: Insufficient documentation

## 2015-07-16 DIAGNOSIS — M1990 Unspecified osteoarthritis, unspecified site: Secondary | ICD-10-CM | POA: Diagnosis not present

## 2015-07-16 DIAGNOSIS — I10 Essential (primary) hypertension: Secondary | ICD-10-CM | POA: Insufficient documentation

## 2015-07-16 DIAGNOSIS — W19XXXA Unspecified fall, initial encounter: Secondary | ICD-10-CM

## 2015-07-16 DIAGNOSIS — G8929 Other chronic pain: Secondary | ICD-10-CM

## 2015-07-16 NOTE — ED Notes (Signed)
Pt states that he is here for documentation for work related falls  In the past. Pt states that this pain has now worsened and he wants documentation of that.

## 2015-07-16 NOTE — ED Notes (Signed)
Pt here for right hip and right knee pain. sts he had a fall at work. sts also back pain and pinched nerve in back.

## 2015-07-16 NOTE — Discharge Instructions (Signed)

## 2015-07-16 NOTE — ED Provider Notes (Signed)
CSN: [id number]     Arrival date & time 07/16/15  1728 History  This chart was scribed for Teressa Lower, NP, working with Pricilla Loveless, MD by Elon Spanner, ED Scribe. This patient was seen in room TR09C/TR09C and the patient's care was started at 5:42 PM.   Chief Complaint  Patient presents with  . Hip Pain  . Knee Pain   Patient is a 54 y.o. male presenting with knee pain. The history is provided by the patient. No language interpreter was used.  Knee Pain  HPI Comments: Sean Mccoy is a 54 y.o. male with a history of avascular necrosis of the right hip who presents to the Emergency Department complaining of worsening of his chronic right hip pain without new injury.  The patient reports several falls at work over the past 6 months that exacerbated his chronic hip pain.  After the previous falls, the patient was seen in the ED.  He is being followed by [facility] for this complaint and reports he was previously prescribed Tramadol.  However, he has not taken any medication this complaint recently.  His primary motivator for coming into the ED was minimally increased pain and to have documentation of continued pain in the hip.  He denies other complaints, recent falls.   Past Medical History  Diagnosis Date  . Hypertension   . Arthritis   . Knee fracture   . Groin injury   . Ankle injury   . Avascular necrosis    History reviewed. No pertinent past surgical history. History reviewed. No pertinent family history. History  Substance Use Topics  . Smoking status: Never Smoker   . Smokeless tobacco: Never Used  . Alcohol Use: 1.2 oz/week    2 Cans of beer per week     Comment: occ    Review of Systems  Musculoskeletal: Positive for arthralgias.      Allergies  Review of patient's allergies indicates no known allergies.  Home Medications   Prior to Admission medications   Medication Sig Start Date End Date Taking? Authorizing Provider  traMADol (ULTRAM)  50 MG tablet Take 1 tablet (50 mg total) by mouth every 12 (twelve) hours as needed. 06/02/15   Ambrose Finland, NP   BP 138/98 mmHg  Pulse 102  Temp(Src) 98.5 F (36.9 C) (Oral)  Resp 20  SpO2 97% Physical Exam  Constitutional: He is oriented to person, place, and time. He appears well-developed and well-nourished. No distress.  HENT:  Head: Normocephalic and atraumatic.  Eyes: Conjunctivae and EOM are normal.  Neck: Neck supple. No tracheal deviation present.  Cardiovascular: Normal rate.   Pulmonary/Chest: Effort normal. No respiratory distress.  Musculoskeletal: Normal range of motion.  Tender on the right hip. No gross deformity or swelling. Pulses intact  Neurological: He is alert and oriented to person, place, and time.  Skin: Skin is warm and dry.  Psychiatric: He has a normal mood and affect. His behavior is normal.  Nursing note and vitals reviewed.   ED Course  Procedures (including critical care time)  DIAGNOSTIC STUDIES: Oxygen Saturation is 97% on RA, normal by my interpretation.    COORDINATION OF CARE:  5:54 PM Discussed treatment plan with patient at bedside.  Patient acknowledges and agrees with plan.     Labs Review Labs Reviewed - No data to display  Imaging Review No results found.   EKG Interpretation None      MDM   Final diagnoses:  Fall, initial  encounter  Chronic hip pain, right    Discussed treatment of chronic pain with pain management  I personally performed the services described in this documentation, which was scribed in my presence. The recorded information has been reviewed and is accurate.   Teressa Lower, NP 07/16/15 8309  Pricilla Loveless, MD 07/16/15 [phone]

## 2015-07-16 NOTE — Progress Notes (Signed)
Patient ID: Sean Mccoy, male   DOB: 10/27/1961, 54 y.o.   MRN: [id number] Patient presented to Pharmacy yesterday to get prescription of Tramadol filled but was denied due to suspicious script. Prescription reviewed by me as well with had incorrect spelling of name and unknown format. Prescription was from Dr. Ronne Binning. Review of Controlled Substance Reporting Site revealed that he had 90 tablets of Percocets filled on 06/26/15 from same provider. Patient also no showed to his appointment today but was seen in the ER around same time as office visit. I have suspicion patient is doctor shopping for pain medication.

## 2015-07-21 ENCOUNTER — Ambulatory Visit: Payer: Self-pay | Admitting: Internal Medicine

## 2015-07-25 ENCOUNTER — Encounter: Payer: Self-pay | Admitting: Internal Medicine

## 2015-07-25 ENCOUNTER — Ambulatory Visit: Payer: Medicaid Other | Attending: Internal Medicine | Admitting: Internal Medicine

## 2015-07-25 VITALS — BP 120/75 | HR 96 | Temp 98.1°F | Resp 18 | Ht 70.0 in | Wt 166.0 lb

## 2015-07-25 DIAGNOSIS — M8788 Other osteonecrosis, other site: Secondary | ICD-10-CM | POA: Diagnosis present

## 2015-07-25 DIAGNOSIS — M87052 Idiopathic aseptic necrosis of left femur: Secondary | ICD-10-CM | POA: Diagnosis not present

## 2015-07-25 DIAGNOSIS — I10 Essential (primary) hypertension: Secondary | ICD-10-CM | POA: Diagnosis not present

## 2015-07-25 NOTE — Progress Notes (Signed)
Patient needs letter for disability. Provider has to verify something.  Patients job was terminated due to worsening condition.   Patient goes to pain clinic.   Patient has right hip pain, at level 9, described as piercing.

## 2015-07-26 ENCOUNTER — Emergency Department (HOSPITAL_COMMUNITY): Payer: Medicaid Other

## 2015-07-26 ENCOUNTER — Encounter (HOSPITAL_COMMUNITY): Payer: Self-pay | Admitting: Emergency Medicine

## 2015-07-26 ENCOUNTER — Emergency Department (HOSPITAL_COMMUNITY)
Admission: EM | Admit: 2015-07-26 | Discharge: 2015-07-26 | Disposition: A | Discharge status: Home / Self Care | Payer: Medicaid Other | Attending: Emergency Medicine | Admitting: Emergency Medicine

## 2015-07-26 DIAGNOSIS — W1839XA Other fall on same level, initial encounter: Secondary | ICD-10-CM | POA: Diagnosis not present

## 2015-07-26 DIAGNOSIS — Y939 Activity, unspecified: Secondary | ICD-10-CM | POA: Insufficient documentation

## 2015-07-26 DIAGNOSIS — G8929 Other chronic pain: Secondary | ICD-10-CM | POA: Diagnosis not present

## 2015-07-26 DIAGNOSIS — I10 Essential (primary) hypertension: Secondary | ICD-10-CM | POA: Insufficient documentation

## 2015-07-26 DIAGNOSIS — Z8781 Personal history of (healed) traumatic fracture: Secondary | ICD-10-CM | POA: Insufficient documentation

## 2015-07-26 DIAGNOSIS — M25551 Pain in right hip: Secondary | ICD-10-CM | POA: Insufficient documentation

## 2015-07-26 DIAGNOSIS — Y999 Unspecified external cause status: Secondary | ICD-10-CM | POA: Insufficient documentation

## 2015-07-26 DIAGNOSIS — Y929 Unspecified place or not applicable: Secondary | ICD-10-CM | POA: Insufficient documentation

## 2015-07-26 DIAGNOSIS — M1990 Unspecified osteoarthritis, unspecified site: Secondary | ICD-10-CM | POA: Diagnosis not present

## 2015-07-26 MED ORDER — ALBUTEROL SULFATE HFA 108 (90 BASE) MCG/ACT IN AERS: 2.0000 | INHALATION_SPRAY | Freq: Once | RESPIRATORY_TRACT | Status: DC

## 2015-07-26 NOTE — ED Notes (Signed)
Patient transported to X-ray 

## 2015-07-26 NOTE — Discharge Instructions (Signed)

## 2015-07-26 NOTE — ED Provider Notes (Signed)
CSN: [id number]     Arrival date & time 07/26/15  0238 History   First MD Initiated Contact with Patient 07/26/15 0301     Chief Complaint  Patient presents with  . Alcohol Intoxication     (Consider location/radiation/quality/duration/timing/severity/associated sxs/prior Treatment) HPI Comments: Patient well known to ED presents with complaint of right hip pain, chronic. He states he fell last night after losing his balance and impacted with the floor on the right hip, increasing his chronic level of pain. No other injury.   The history is provided by the patient. No language interpreter was used.    Past Medical History  Diagnosis Date  . Hypertension   . Arthritis   . Knee fracture   . Groin injury   . Ankle injury   . Avascular necrosis    History reviewed. No pertinent past surgical history. History reviewed. No pertinent family history. History  Substance Use Topics  . Smoking status: Never Smoker   . Smokeless tobacco: Never Used  . Alcohol Use: No     Comment: occ    Review of Systems  Constitutional: Negative for fever and chills.  Gastrointestinal: Negative.  Negative for abdominal pain.  Musculoskeletal:       See HPI  Skin: Negative.  Negative for wound.  Neurological: Negative.       Allergies  Review of patient's allergies indicates no known allergies.  Home Medications   Prior to Admission medications   Medication Sig Start Date End Date Taking? Authorizing Provider  traMADol (ULTRAM) 50 MG tablet Take 1 tablet (50 mg total) by mouth every 12 (twelve) hours as needed. 06/02/15   Ambrose Finland, NP   BP 143/91 mmHg  Pulse 92  Temp(Src) 97.8 F (36.6 C) (Oral)  Resp 20  SpO2 96% Physical Exam  Constitutional: He appears well-developed and well-nourished.  HENT:  Head: Normocephalic.  Neck: Normal range of motion. Neck supple.  Cardiovascular: Normal rate and regular rhythm.   Pulmonary/Chest: Effort normal and breath sounds normal.   Abdominal: Soft. Bowel sounds are normal. There is no tenderness. There is no rebound and no guarding.  Musculoskeletal: Normal range of motion.  Right hip tender to palpation. FROM. No swelling. Ambulatory and fully weight bearing.  Neurological: He is alert.  Skin: Skin is warm and dry. No rash noted.  Psychiatric: He has a normal mood and affect.    ED Course  Procedures (including critical care time) Labs Review Labs Reviewed - No data to display  Imaging Review No results found.   EKG Interpretation None      MDM   Final diagnoses:  None    1. Hip pain, chronic  The patient is intoxicated on arrival. EMS was called by mall security officer after finding the patient lying on the ground outside the mall. On arrival here, the patient denied any new injury or trauma to the hip. Imaging showing no new or acute finding. He has been ambulatory in the department to and from the bathroom. Chart reviewed. He is in Pain Management.   He is awake, alert. Intoxicated but coherent now. He is stable for discharge home.     Elpidio Anis, PA-C 07/26/15 3419  Tomasita Crumble, MD 07/26/15 [phone]

## 2015-07-26 NOTE — ED Notes (Signed)
Awake. Verbally responsive. A/O x4. Resp even and unlabored. No audible adventitious breath sounds noted. ABC's intact.  

## 2015-07-26 NOTE — ED Notes (Signed)
Resting quietly with eye closed. Easily arousable. Verbally responsive. Resp even and unlabored. ABC's intact. No behavior problems noted. NAD noted.  

## 2015-07-26 NOTE — ED Notes (Signed)
Brought in by EMS [place] a mall with c/o alcohol intoxication.  Pt was observed lying on the street by the Engineer, materials of the mall---- officer called EMS.  Pt answered a few questions by EMS, otherwise, no significant "data" obtained.  Pt arrived to ED responsive but very drowsy.  Pt reports drinking "just a few beers".

## 2015-07-26 NOTE — ED Notes (Signed)
Pt reports chronic left hip pain.  Pt reports that he has had a recent hip surgery.  No changes in pain; pt denies new trauma or injury.

## 2015-07-26 NOTE — ED Notes (Signed)
Bed: [facility] Expected date:  Expected time:  Means of arrival:  Comments: EMS chronic hip pain, etoh

## 2015-07-27 NOTE — Progress Notes (Signed)
Patient ID: Sean Mccoy, male   DOB: [birth year], 54 y.o.   MRN: [id number]  CC: disability letter   HPI: Sean Mccoy is a 54 y.o. male here today for a follow up visit.  Patient has past medical history of avascular necrosis. 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 Patient reports that he was recently working but has been terminated due to being unable to keep up with job duties due to hip pain. Patient reports that since he has lost his job he has been unable to work and his food stamps have been terminated as well. Patient continues to discuss that he has not scheduled his hip replacement surgery because he needs to work to pay his rent.  No Known Allergies Past Medical History  Diagnosis Date  . Hypertension   . Arthritis   . Knee fracture   . Groin injury   . Ankle injury   . Avascular necrosis    Current Outpatient Prescriptions on File Prior to Visit  Medication Sig Dispense Refill  . traMADol (ULTRAM) 50 MG tablet Take 1 tablet (50 mg total) by mouth every 12 (twelve) hours as needed. 30 tablet 0   No current facility-administered medications on file prior to visit.   History reviewed. No pertinent family history. History   Social History  . Marital Status: Single    Spouse Name: N/A  . Number of Children: N/A  . Years of Education: N/A   Occupational History  . Not on file.   Social History Main Topics  . Smoking status: Never Smoker   . Smokeless tobacco: Never Used  . Alcohol Use: No     Comment: occ  . Drug Use: No  . Sexual Activity: Yes    Birth Control/ Protection: Condom   Other Topics Concern  . Not on file   Social History Narrative    Review of Systems: See HPI   Objective:   Filed Vitals:   07/25/15 1709  BP: 120/75  Pulse: 96  Temp: 98.1 F (36.7 C)  Resp: 18    Physical Exam  Psychiatric:  Angry and tearful     Lab Results  Component Value Date   WBC 5.3 03/19/2015   HGB 12.0* 03/19/2015   HCT 35.3* 03/19/2015   MCV 93.4 03/19/2015   PLT 339 03/19/2015   Lab Results  Component Value Date   CREATININE 0.96 03/19/2015   BUN 17 03/19/2015   NA 137 03/19/2015   K 4.3 03/19/2015   CL 100 03/19/2015   CO2 26 03/19/2015    Lab Results  Component Value Date   HGBA1C 4.8 01/01/2015   Lipid Panel     Component Value Date/Time   CHOL 207* 01/01/2015 1042   TRIG 54 01/01/2015 1042   HDL 90 01/01/2015 1042   CHOLHDL 2.3 01/01/2015 1042   VLDL 11 01/01/2015 1042   LDLCALC 106* 01/01/2015 1042       Assessment and plan:   Diagnoses and all orders for this visit:  Avascular necrosis of bone of hip, left Orders: -     DME Crutches I have explained to patient that I am unable to write a disability letter for him because he was just currently working and now that he is terminated he still refuses to schedule hip replacement surgery. He plans to continue to work so that he can pay rent but needs his food stamps back now.  I explained why this was unacceptable. I explained that I will agree to give him a letter stating temporary disability if he schedules his hip surgery so that he can recover. I have also explained that I will not continue to write pain medication for him, that pain medication is only putting him at further risk for more complication and it is not fixing his problem.    >50% of visit was spent explaining why I am unable to write note and listening to patients concerns.  Return if symptoms worsen or fail to improve.       Ambrose Finland, NP-C [facility] [phone] 07/27/2015, 10:51 PM

## 2015-07-28 ENCOUNTER — Emergency Department (HOSPITAL_COMMUNITY)
Admission: EM | Admit: 2015-07-28 | Discharge: 2015-07-28 | Disposition: A | Discharge status: Home / Self Care | Payer: Medicaid Other | Attending: Emergency Medicine | Admitting: Emergency Medicine

## 2015-07-28 ENCOUNTER — Encounter (HOSPITAL_COMMUNITY): Payer: Self-pay | Admitting: Emergency Medicine

## 2015-07-28 DIAGNOSIS — Z87828 Personal history of other (healed) physical injury and trauma: Secondary | ICD-10-CM | POA: Diagnosis not present

## 2015-07-28 DIAGNOSIS — Z8739 Personal history of other diseases of the musculoskeletal system and connective tissue: Secondary | ICD-10-CM | POA: Diagnosis not present

## 2015-07-28 DIAGNOSIS — I10 Essential (primary) hypertension: Secondary | ICD-10-CM | POA: Insufficient documentation

## 2015-07-28 DIAGNOSIS — Z8781 Personal history of (healed) traumatic fracture: Secondary | ICD-10-CM | POA: Diagnosis not present

## 2015-07-28 DIAGNOSIS — G8929 Other chronic pain: Secondary | ICD-10-CM | POA: Insufficient documentation

## 2015-07-28 DIAGNOSIS — Z760 Encounter for issue of repeat prescription: Secondary | ICD-10-CM | POA: Diagnosis not present

## 2015-07-28 NOTE — ED Provider Notes (Signed)
CSN: [id number]     Arrival date & time 07/28/15  1343 History  This chart was scribed for non-physician practitioner, Roxy Horseman, PA-C, working with Rolan Bucco, MD by Charline Bills, ED Scribe. This patient was seen in room WTR9/WTR9 and the patient's care was started at 3:03 PM.   Chief Complaint  Patient presents with  . Medication Refill   The history is provided by the patient. No language interpreter was used.   HPI Comments: Sean Mccoy is a 54 y.o. male, with a  H/o HTN and arthritis, who presents to the Emergency Department for a medication refill. Pt reports chronic right hip pain and right knee pain for several months that was exacerbated 2 days ago after a fall. He was prescribed Percocet but states that he lost his medication. Pt requests a refill at this time.   Past Medical History  Diagnosis Date  . Hypertension   . Arthritis   . Knee fracture   . Groin injury   . Ankle injury   . Avascular necrosis    History reviewed. No pertinent past surgical history. No family history on file. History  Substance Use Topics  . Smoking status: Never Smoker   . Smokeless tobacco: Never Used  . Alcohol Use: No     Comment: occ    Review of Systems  Musculoskeletal: Positive for arthralgias.   Allergies  Review of patient's allergies indicates no known allergies.  Home Medications   Prior to Admission medications   Medication Sig Start Date End Date Taking? Authorizing Provider  traMADol (ULTRAM) 50 MG tablet Take 1 tablet (50 mg total) by mouth every 12 (twelve) hours as needed. 06/02/15   Ambrose Finland, NP   BP 159/80 mmHg  Pulse 91  Temp(Src) 98.2 F (36.8 C) (Oral)  Resp 19  SpO2 100% Physical Exam  Constitutional: He is oriented to person, place, and time. He appears well-developed and well-nourished. No distress.  HENT:  Head: Normocephalic and atraumatic.  Eyes: Conjunctivae and EOM are normal.  Neck: Neck supple. No tracheal deviation present.   Cardiovascular: Normal rate.   Pulmonary/Chest: Effort normal. No respiratory distress.  Musculoskeletal: Normal range of motion.  Neurological: He is alert and oriented to person, place, and time.  Skin: Skin is warm and dry.  Psychiatric: He has a normal mood and affect. His behavior is normal.  Nursing note and vitals reviewed.  ED Course  Procedures (including critical care time) DIAGNOSTIC STUDIES: Oxygen Saturation is 100% on RA, normal by my interpretation.    COORDINATION OF CARE: 3:07 PM-Discussed treatment planwith pt at bedside and pt agreed to plan.   Labs Review Labs Reviewed - No data to display  Imaging Review No results found.   EKG Interpretation None      MDM   Final diagnoses:  Medication refill   Patient requesting refill of percocet.  Advised of chronic pain policy.  DC to home.  Patient is stable and ready for discharge.  I personally performed the services described in this documentation, which was scribed in my presence. The recorded information has been reviewed and is accurate.     Roxy Horseman, PA-C 07/28/15 1534  Rolan Bucco, MD 07/28/15 [phone]

## 2015-07-28 NOTE — ED Notes (Signed)
Pt states that he is needs refill on pain meds. Pt has pain in right hip and right knee.

## 2015-07-28 NOTE — ED Notes (Signed)
Pt is requesting replacement crutches. His are damaged and he uses them for comfort and mobility

## 2015-07-28 NOTE — Discharge Instructions (Signed)
Chronic Pain Discharge Instructions  °Emergency care providers appreciate that many patients coming to us are in severe pain and we wish to address their pain in the safest, most responsible manner.  It is important to recognize however, that the proper treatment of chronic pain differs from that of the pain of injuries and acute illnesses.  Our goal is to provide quality, safe, personalized care and we thank you for giving us the opportunity to serve you. °The use of narcotics and related agents for chronic pain syndromes may lead to additional physical and psychological problems.  Nearly as many people die from prescription narcotics each year as die from car crashes.  Additionally, this risk is increased if such prescriptions are obtained from a variety of sources.  Therefore, only your primary care physician or a pain management specialist is able to safely treat such syndromes with narcotic medications long-term.   ° °Documentation revealing such prescriptions have been sought from multiple sources may prohibit us from providing a refill or different narcotic medication.  Your name may be checked first through the [place] Controlled Substances Reporting System.  This database is a record of controlled substance medication prescriptions that the patient has received.  This has been established by [facility] in an effort to eliminate the dangerous, and often life threatening, practice of obtaining multiple prescriptions from different medical providers.  ° °If you have a chronic pain syndrome (i.e. chronic headaches, recurrent back or neck pain, dental pain, abdominal or pelvis pain without a specific diagnosis, or neuropathic pain such as fibromyalgia) or recurrent visits for the same condition without an acute diagnosis, you may be treated with non-narcotics and other non-addictive medicines.  Allergic reactions or negative side effects that may be reported by a patient to such medications will not  typically lead to the use of a narcotic analgesic or other controlled substance as an alternative. °  °Patients managing chronic pain with a personal physician should have provisions in place for breakthrough pain.  If you are in crisis, you should call your physician.  If your physician directs you to the emergency department, please have the doctor call and speak to our attending physician concerning your care. °  °When patients come to the Emergency Department (ED) with acute medical conditions in which the Emergency Department physician feels appropriate to prescribe narcotic or sedating pain medication, the physician will prescribe these in very limited quantities.  The amount of these medications will last only until you can see your primary care physician in his/her office.  Any patient who returns to the ED seeking refills should expect only non-narcotic pain medications.  ° °In the event of an acute medical condition exists and the emergency physician feels it is necessary that the patient be given a narcotic or sedating medication -  a responsible adult driver should be present in the room prior to the medication being given by the nurse. °  °Prescriptions for narcotic or sedating medications that have been lost, stolen or expired will not be refilled in the Emergency Department.   ° °Patients who have chronic pain may receive non-narcotic prescriptions until seen by their primary care physician.  It is every patient’s personal responsibility to maintain active prescriptions with his or her primary care physician or specialist. °

## 2015-07-29 ENCOUNTER — Telehealth: Payer: Self-pay | Admitting: Clinical

## 2015-07-29 NOTE — Telephone Encounter (Signed)
Attempt to f/u w pt; left HIPPA-compliant message

## 2015-07-30 NOTE — Progress Notes (Signed)
Orthopedic Tech Progress Note Patient Details:  Sean Mccoy Feb 27, 1961 [id number]  Ortho Devices Type of Ortho Device: Crutches   [name] 07/30/2015, 5:43 PM

## 2015-08-31 ENCOUNTER — Encounter (HOSPITAL_COMMUNITY): Payer: Self-pay | Admitting: Emergency Medicine

## 2015-08-31 ENCOUNTER — Emergency Department (HOSPITAL_COMMUNITY): Payer: Medicaid Other

## 2015-08-31 ENCOUNTER — Emergency Department (HOSPITAL_COMMUNITY)
Admission: EM | Admit: 2015-08-31 | Discharge: 2015-08-31 | Disposition: A | Discharge status: Home / Self Care | Payer: Medicaid Other | Attending: Emergency Medicine | Admitting: Emergency Medicine

## 2015-08-31 DIAGNOSIS — Y9289 Other specified places as the place of occurrence of the external cause: Secondary | ICD-10-CM | POA: Diagnosis not present

## 2015-08-31 DIAGNOSIS — I10 Essential (primary) hypertension: Secondary | ICD-10-CM | POA: Diagnosis not present

## 2015-08-31 DIAGNOSIS — Y998 Other external cause status: Secondary | ICD-10-CM | POA: Insufficient documentation

## 2015-08-31 DIAGNOSIS — S00431A Contusion of right ear, initial encounter: Secondary | ICD-10-CM | POA: Insufficient documentation

## 2015-08-31 DIAGNOSIS — Z8739 Personal history of other diseases of the musculoskeletal system and connective tissue: Secondary | ICD-10-CM | POA: Insufficient documentation

## 2015-08-31 DIAGNOSIS — S0003XA Contusion of scalp, initial encounter: Secondary | ICD-10-CM | POA: Insufficient documentation

## 2015-08-31 DIAGNOSIS — Z8781 Personal history of (healed) traumatic fracture: Secondary | ICD-10-CM | POA: Diagnosis not present

## 2015-08-31 DIAGNOSIS — T148XXA Other injury of unspecified body region, initial encounter: Secondary | ICD-10-CM

## 2015-08-31 DIAGNOSIS — S0990XA Unspecified injury of head, initial encounter: Secondary | ICD-10-CM | POA: Diagnosis present

## 2015-08-31 DIAGNOSIS — Y9389 Activity, other specified: Secondary | ICD-10-CM | POA: Insufficient documentation

## 2015-08-31 MED ORDER — IBUPROFEN 200 MG PO TABS
600.0000 mg | ORAL_TABLET | Freq: Once | ORAL | Status: AC
  Administered 2015-08-31: 600 mg via ORAL
  Filled 2015-08-31: qty 3
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
## 2015-08-31 NOTE — ED Notes (Signed)
Patient transported to CT 
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
## 2015-08-31 NOTE — Discharge Instructions (Signed)
Please take ibuprofen for pain relief as needed. Please return to the Emergency Department if symptoms worsen or new onset of visual changes, weakness, numbness, tingling.

## 2015-10-27 ENCOUNTER — Other Ambulatory Visit (HOSPITAL_COMMUNITY): Payer: Self-pay | Admitting: Orthopaedic Surgery

## 2015-10-31 ENCOUNTER — Inpatient Hospital Stay (HOSPITAL_COMMUNITY)
Admission: RE | Admit: 2015-10-31 | Discharge: 2015-10-31 | Disposition: A | Discharge status: Home / Self Care | Payer: Self-pay | Source: Ambulatory Visit

## 2015-10-31 NOTE — Pre-Procedure Instructions (Signed)
Sean Mccoy  10/31/2015      [facility] [place] [place] - [address] [place] [address] Phone: [phone] Fax: [phone]    Your procedure is scheduled on 11/11/15  Report to [facility] short stay admitting at 900 A.M.  Call this number if you have problems the morning of surgery:  [phone]   Remember:  Do not eat food or drink liquids after midnight.  Take these medicines the morning of surgery with A SIP OF WATER tramadol if needed   STOP all herbel meds, nsaids (aleve,naproxen,advil,ibuprofen) 5 days prior to surgery starting 11/06/15 including vitamins, aspirin   Do not wear jewelry, make-up or nail polish.  Do not wear lotions, powders, or perfumes.  You may wear deodorant.  Do not shave 48 hours prior to surgery.  Men may shave face and neck.  Do not bring valuables to the hospital.  [facility] is not responsible for any belongings or valuables.  Contacts, dentures or bridgework may not be worn into surgery.  Leave your suitcase in the car.  After surgery it may be brought to your room.  For patients admitted to the hospital, discharge time will be determined by your treatment team.  Patients discharged the day of surgery will not be allowed to drive home.   Name and phone number of your driver:    Special instructions:   Special Instructions: [place] - Preparing for Surgery  Before surgery, you can play an important role.  Because skin is not sterile, your skin needs to be as free of germs as possible.  You can reduce the number of germs on you skin by washing with CHG (chlorahexidine gluconate) soap before surgery.  CHG is an antiseptic cleaner which kills germs and bonds with the skin to continue killing germs even after washing.  Please DO NOT use if you have an allergy to CHG or antibacterial soaps.  If your skin becomes reddened/irritated stop using the CHG and inform your nurse when you arrive at  Short Stay.  Do not shave (including legs and underarms) for at least 48 hours prior to the first CHG shower.  You may shave your face.  Please follow these instructions carefully:   1.  Shower with CHG Soap the night before surgery and the morning of Surgery.  2.  If you choose to wash your hair, wash your hair first as usual with your normal shampoo.  3.  After you shampoo, rinse your hair and body thoroughly to remove the Shampoo.  4.  Use CHG as you would any other liquid soap.  You can apply chg directly  to the skin and wash gently with scrungie or a clean washcloth.  5.  Apply the CHG Soap to your body ONLY FROM THE NECK DOWN.  Do not use on open wounds or open sores.  Avoid contact with your eyes ears, mouth and genitals (private parts).  Wash genitals (private parts)       with your normal soap.  6.  Wash thoroughly, paying special attention to the area where your surgery will be performed.  7.  Thoroughly rinse your body with warm water from the neck down.  8.  DO NOT shower/wash with your normal soap after using and rinsing off the CHG Soap.  9.  Pat yourself dry with a clean towel.            10.  Wear clean pajamas.  11.  Place clean sheets on your bed the night of your first shower and do not sleep with pets.  Day of Surgery  Do not apply any lotions/deodorants the morning of surgery.  Please wear clean clothes to the hospital/surgery center.  Please read over the following fact sheets that you were given. Pain Booklet, Coughing and Deep Breathing, Total Joint Packet, MRSA Information and Surgical Site Infection Prevention

## 2015-10-31 NOTE — Progress Notes (Signed)
Patient no show for preadmit appt. Message left on [phone]. Unable to get through on other number.

## 2015-11-11 ENCOUNTER — Inpatient Hospital Stay (HOSPITAL_COMMUNITY): Admission: RE | Admit: 2015-11-11 | Payer: Medicaid Other | Source: Ambulatory Visit | Admitting: Orthopaedic Surgery

## 2015-11-11 ENCOUNTER — Encounter (HOSPITAL_COMMUNITY): Admission: RE | Payer: Self-pay | Source: Ambulatory Visit

## 2015-11-11 SURGERY — ARTHROPLASTY, HIP, TOTAL, ANTERIOR APPROACH
Anesthesia: Choice | Laterality: Right

## 2015-12-08 ENCOUNTER — Ambulatory Visit: Payer: Self-pay | Admitting: Sports Medicine

## 2015-12-16 ENCOUNTER — Ambulatory Visit: Payer: Medicaid Other | Admitting: Sports Medicine

## 2015-12-17 ENCOUNTER — Telehealth: Payer: Self-pay | Admitting: Internal Medicine

## 2015-12-17 NOTE — Telephone Encounter (Signed)
Patient dropped off a Clinical biochemist for provider to fill out, patient would like application faxed to [phone].  Patient also attached office notes from other facilities in case PCP has questions.

## 2015-12-19 ENCOUNTER — Encounter (HOSPITAL_COMMUNITY): Payer: Self-pay

## 2015-12-19 ENCOUNTER — Emergency Department (HOSPITAL_COMMUNITY)
Admission: EM | Admit: 2015-12-19 | Discharge: 2015-12-19 | Disposition: A | Discharge status: Home / Self Care | Payer: Medicaid Other | Attending: Emergency Medicine | Admitting: Emergency Medicine

## 2015-12-19 ENCOUNTER — Emergency Department (HOSPITAL_COMMUNITY): Payer: Medicaid Other

## 2015-12-19 DIAGNOSIS — Y9289 Other specified places as the place of occurrence of the external cause: Secondary | ICD-10-CM | POA: Insufficient documentation

## 2015-12-19 DIAGNOSIS — I10 Essential (primary) hypertension: Secondary | ICD-10-CM | POA: Diagnosis not present

## 2015-12-19 DIAGNOSIS — M1990 Unspecified osteoarthritis, unspecified site: Secondary | ICD-10-CM | POA: Insufficient documentation

## 2015-12-19 DIAGNOSIS — Y9389 Activity, other specified: Secondary | ICD-10-CM | POA: Insufficient documentation

## 2015-12-19 DIAGNOSIS — Y998 Other external cause status: Secondary | ICD-10-CM | POA: Diagnosis not present

## 2015-12-19 DIAGNOSIS — G8929 Other chronic pain: Secondary | ICD-10-CM | POA: Insufficient documentation

## 2015-12-19 DIAGNOSIS — Z8781 Personal history of (healed) traumatic fracture: Secondary | ICD-10-CM | POA: Insufficient documentation

## 2015-12-19 DIAGNOSIS — W01198A Fall on same level from slipping, tripping and stumbling with subsequent striking against other object, initial encounter: Secondary | ICD-10-CM | POA: Diagnosis not present

## 2015-12-19 DIAGNOSIS — S7001XA Contusion of right hip, initial encounter: Secondary | ICD-10-CM | POA: Diagnosis not present

## 2015-12-19 DIAGNOSIS — S79911A Unspecified injury of right hip, initial encounter: Secondary | ICD-10-CM | POA: Diagnosis present

## 2015-12-19 DIAGNOSIS — M25551 Pain in right hip: Secondary | ICD-10-CM

## 2015-12-19 MED ORDER — IBUPROFEN 400 MG PO TABS
600.0000 mg | ORAL_TABLET | Freq: Once | ORAL | Status: AC
  Administered 2015-12-19: 600 mg via ORAL
  Filled 2015-12-19: qty 1

## 2015-12-19 NOTE — Discharge Instructions (Signed)
Chronic Pain  Chronic pain can be defined as pain that is off and on and lasts for 3-6 months or longer. Many things cause chronic pain, which can make it difficult to make a diagnosis. There are many treatment options available for chronic pain. However, finding a treatment that works well for you may require trying various approaches until the right one is found. Many people benefit from a combination of two or more types of treatment to control their pain.  SYMPTOMS   Chronic pain can occur anywhere in the body and can range from mild to very severe. Some types of chronic pain include:  · Headache.  · Low back pain.  · Cancer pain.  · Arthritis pain.  · Neurogenic pain. This is pain resulting from damage to nerves.   People with chronic pain may also have other symptoms such as:  · Depression.  · Anger.  · Insomnia.  · Anxiety.  DIAGNOSIS   Your health care provider will help diagnose your condition over time. In many cases, the initial focus will be on excluding possible conditions that could be causing the pain. Depending on your symptoms, your health care provider may order tests to diagnose your condition. Some of these tests may include:   · Blood tests.    · CT scan.    · MRI.    · X-rays.    · Ultrasounds.    · Nerve conduction studies.    You may need to see a specialist.   TREATMENT   Finding treatment that works well may take time. You may be referred to a pain specialist. He or she may prescribe medicine or therapies, such as:   · Mindful meditation or yoga.  · Shots (injections) of numbing or pain-relieving medicines into the spine or area of pain.  · Local electrical stimulation.  · Acupuncture.    · Massage therapy.    · Aroma, color, light, or sound therapy.    · Biofeedback.    · Working with a physical therapist to keep from getting stiff.    · Regular, gentle exercise.    · Cognitive or behavioral therapy.    · Group support.    Sometimes, surgery may be recommended.   HOME CARE INSTRUCTIONS    · Take all medicines as directed by your health care provider.    · Lessen stress in your life by relaxing and doing things such as listening to calming music.    · Exercise or be active as directed by your health care provider.    · Eat a healthy diet and include things such as vegetables, fruits, fish, and lean meats in your diet.    · Keep all follow-up appointments with your health care provider.    · Attend a support group with others suffering from chronic pain.  SEEK MEDICAL CARE IF:   · Your pain gets worse.    · You develop a new pain that was not there before.    · You cannot tolerate medicines given to you by your health care provider.    · You have new symptoms since your last visit with your health care provider.    SEEK IMMEDIATE MEDICAL CARE IF:   · You feel weak.    · You have decreased sensation or numbness.    · You lose control of bowel or bladder function.    · Your pain suddenly gets much worse.    · You develop shaking.  · You develop chills.  · You develop confusion.  · You develop chest pain.  · You develop shortness of breath.    MAKE SURE YOU:  ·   Document Revised: 08/15/2013 Document Reviewed: 06/08/2013 Elsevier Interactive Patient Education 2016 [employer] Avascular Necrosis Avascular necrosis is a disease resulting from the temporary or permanent loss of blood supply to a bone. This disease may also be known as:   Osteonecrosis.   Aseptic necrosis.   Ischemic bone necrosis. Without proper blood supply, the internal layer of the affected bone dies and the outer layer of the bone may break down. If this process affects a bone near a joint,  it may lead to collapse of that joint. Common bones that are affected by this condition include:  The top of your thigh bone (femoral head).  One or more bones in your wrist (scaphoid orlunate).  One or more bones in your foot (metatarsals).  One of the bones in your ankle (navicular). The joint most commonly affected by this condition is the hip joint. Avascular necrosis rarely occurs in more than one bone at a time.  CAUSES   Damage or injury to a bone or joint.  Using corticosteroid medicine for a long period of time.  Changes in your immune or hormone systems.  Excessive exposure to radiation. RISK FACTORS  Alcohol abuse.  Previous traumatic injury to a joint.  Using corticosteroid medicines for a long period of time or often.  Having a medical condition such as:  HIV or AIDS.   Diabetes.   Sickle cell disease.  An autoimmune disease. SIGNS AND SYMPTOMS  The main symptoms of avascular necrosis are pain and decreased motion in the affected bone or joint. In the early stages the pain may be minor and occur only with activity. As avascular necrosis progresses, pain may gradually worsen and occur while at rest. The pain may suddenly become severe if an affected joint collapses. DIAGNOSIS  Avascular necrosis may be diagnosed with:   A medical history.  A physical exam.   X-rays.  An MRI.  A bone scan. TREATMENT  Treatments may include:  Medicine to help relieve pain.  Avoiding placing any pressure or weight ontheaffected area. If avascular necrosis occurs in your hip, ankle, or foot, you may be instructed to use crutches or a rolling scooter.  Surgery, such as:   Core decompression. In this surgery, one or more holes are placed in the bone for new blood vessels to grow into. This provides a renewed blood supply to the bone. Core decompression can often reduce pain and pressure in the affected bone and slow the progression of bone and joint  destruction.  Osteotomy. In this surgery, the bone is reshaped to reduce stress on the affected area of the joint.   Bone grafting. In this surgery, healthy bone from one part of your body is transplanted to the affected area.   Arthroplasty. Arthroplasty is also known as total joint replacement. In this surgery, the affected surface on one or both sides of a joint is replaced with artificial parts (prostheses).  Electrical stimulation. This may help encourage new bone growth. HOME CARE INSTRUCTIONS  Take medicines only as directed by your health care provider.   Follow your health care provider's recommendations on limiting activities or using crutches to rest your affected joint.   Meet with aphysical therapist as directed by your health care provider.   Keep all follow-up visits as directed by your health care provider. This is important. SEEK MEDICAL CARE IF:   Your pain worsens.  You have decreased motion in your affected joint. SEEK IMMEDIATE MEDICAL CARE IF:  Your pain suddenly becomes  severe.   This information is not intended to replace advice given to you by your health care provider. Make sure you discuss any questions you have with your health care provider.   Document Released: 06/04/2002 Document Revised: 01/03/2015 Document Reviewed: 02/20/2014 Elsevier Interactive Patient Education [employer]

## 2015-12-19 NOTE — ED Notes (Signed)
Pt. Was walking to the bus stop and had an increase in Rt. Hip pain.  Pt. Reports having chronic rt. Hip pain.  Pt. Also would like his prostate checked,  He reports that we told him a few months ago that his prostate was enlarged and he never had it checked.  He reports that he is having insomnia due to voiding

## 2015-12-19 NOTE — ED Notes (Signed)
Pt oriented that he needs to get undress multiple times and still dress up after the x ray completed, rounding given and pt encouraged that in order for MD to be able to assess him and take care of him he will have to get undress and wear the hospital gown, pt refuses to be help to change, this RN will round again and verify that pt is undress otherwise will notified to MD.

## 2015-12-21 NOTE — ED Provider Notes (Signed)
CSN: [id number]     Arrival date & time 12/19/15  1933 History   First MD Initiated Contact with Patient 12/19/15 1943     Chief Complaint  Patient presents with  . Hip Pain     Patient is a 54 y.o. male presenting with hip pain. The history is provided by the patient. No language interpreter was used.  Hip Pain   Keaghan Staton is a 54 y.o. male who presents to the Emergency Department complaining of right hip pain. He has chronic pain in the right hip. Today he slipped and fell onto the head and reports increased pain in that area. He denies any head injury or loss of consciousness. He has pain with bearing weight to the right hip. Denies any other medical problems. He is followed by orthopedics and is scheduled to have surgery on his hip in January.  Past Medical History  Diagnosis Date  . Hypertension   . Arthritis   . Knee fracture   . Groin injury   . Ankle injury   . Avascular necrosis [facility])    Past Surgical History  Procedure Laterality Date  . Knee surgery     Family History  Problem Relation Age of Onset  . Diabetes Mother   . Hypertension Mother   . Diabetes Father   . Hypertension Father    Social History  Substance Use Topics  . Smoking status: Never Smoker   . Smokeless tobacco: Never Used  . Alcohol Use: No     Comment: occ    Review of Systems  All other systems reviewed and are negative.     Allergies  Review of patient's allergies indicates no known allergies.  Home Medications   Prior to Admission medications   Medication Sig Start Date End Date Taking? Authorizing Provider  oxyCODONE-acetaminophen (PERCOCET) 7.5-325 MG tablet Take 1 tablet by mouth 3 (three) times daily as needed for moderate pain or severe pain.  10/06/15  Yes Historical Provider, MD  traMADol (ULTRAM) 50 MG tablet Take 1 tablet (50 mg total) by mouth every 12 (twelve) hours as needed. Patient not taking: Reported on 12/19/2015 06/02/15   Ambrose Finland, NP   BP 135/88 mmHg   Pulse 88  Temp(Src) 98.1 F (36.7 C) (Oral)  Resp 19  Ht 5\' 10"  (1.778 m)  Wt 160 lb (72.576 kg)  BMI 22.96 kg/m2  SpO2 99% Physical Exam  Constitutional: He is oriented to person, place, and time. He appears well-developed and well-nourished.  HENT:  Head: Normocephalic and atraumatic.  Cardiovascular: Normal rate and regular rhythm.   No murmur heard. Pulmonary/Chest: Effort normal and breath sounds normal. No respiratory distress.  Abdominal: Soft. There is no tenderness. There is no rebound and no guarding.  Musculoskeletal:  Mild tenderness over the right anterior and lateral hip with pain with range of motion of the right hip. There is no ecchymosis or swelling around the hip. 2+ DP pulses bilaterally.  Neurological: He is alert and oriented to person, place, and time.  Skin: Skin is warm and dry.  Psychiatric: He has a normal mood and affect. His behavior is normal.  Nursing note and vitals reviewed.   ED Course  Procedures (including critical care time) Labs Review Labs Reviewed - No data to display  Imaging Review Dg Hip Unilat With Pelvis 2-3 Views Right  12/19/2015  CLINICAL DATA:  Status post fall today with right hip pain. Patient has a history of chronic right hip pain. EXAM: DG  HIP (WITH OR WITHOUT PELVIS) 2-3V RIGHT COMPARISON:  July 26, 2015 FINDINGS: There is avascular necrosis of the right proximal femur unchanged compared to prior exam. No acute displaced fracture or dislocation is identified. 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 No acute fracture or dislocation. Electronically Signed   By: Sherian Rein M.D.   On: 12/19/2015 21:27   I have personally reviewed and evaluated these images and lab results as part of my medical decision-making.   EKG Interpretation None      MDM   Final diagnoses:  Contusion, hip, right, initial encounter  Chronic hip pain, right     Patient here for evaluation of hip pain following a mechanical fall. He is neurovascularly intact on examination. Imaging demonstrates chronic changes with patient's known avascular necrosis but no acute fracture or dislocation. Patient with multiple ED visits for similar complaints. Discussed with patient home care for contusion with ibuprofen. Do not feel narcotics are indicated at this time. Discussed orthopedics follow-up as well as return precautions.    Tilden Fossa, MD 12/21/15 [phone]

## 2016-01-09 ENCOUNTER — Emergency Department (HOSPITAL_COMMUNITY)
Admission: EM | Admit: 2016-01-09 | Discharge: 2016-01-10 | Disposition: A | Discharge status: Home / Self Care | Payer: Medicaid Other | Attending: Emergency Medicine | Admitting: Emergency Medicine

## 2016-01-09 ENCOUNTER — Encounter (HOSPITAL_COMMUNITY): Payer: Self-pay | Admitting: Emergency Medicine

## 2016-01-09 ENCOUNTER — Emergency Department (HOSPITAL_COMMUNITY): Payer: Medicaid Other

## 2016-01-09 DIAGNOSIS — W010XXA Fall on same level from slipping, tripping and stumbling without subsequent striking against object, initial encounter: Secondary | ICD-10-CM | POA: Diagnosis not present

## 2016-01-09 DIAGNOSIS — Y9301 Activity, walking, marching and hiking: Secondary | ICD-10-CM | POA: Diagnosis not present

## 2016-01-09 DIAGNOSIS — R52 Pain, unspecified: Secondary | ICD-10-CM

## 2016-01-09 DIAGNOSIS — S8991XA Unspecified injury of right lower leg, initial encounter: Secondary | ICD-10-CM | POA: Diagnosis not present

## 2016-01-09 DIAGNOSIS — W19XXXA Unspecified fall, initial encounter: Secondary | ICD-10-CM

## 2016-01-09 DIAGNOSIS — Y92481 Parking lot as the place of occurrence of the external cause: Secondary | ICD-10-CM | POA: Insufficient documentation

## 2016-01-09 DIAGNOSIS — M1990 Unspecified osteoarthritis, unspecified site: Secondary | ICD-10-CM | POA: Diagnosis not present

## 2016-01-09 DIAGNOSIS — Z8781 Personal history of (healed) traumatic fracture: Secondary | ICD-10-CM | POA: Diagnosis not present

## 2016-01-09 DIAGNOSIS — M25451 Effusion, right hip: Secondary | ICD-10-CM | POA: Diagnosis not present

## 2016-01-09 DIAGNOSIS — M25561 Pain in right knee: Secondary | ICD-10-CM

## 2016-01-09 DIAGNOSIS — I10 Essential (primary) hypertension: Secondary | ICD-10-CM | POA: Diagnosis not present

## 2016-01-09 DIAGNOSIS — S79911A Unspecified injury of right hip, initial encounter: Secondary | ICD-10-CM | POA: Diagnosis present

## 2016-01-09 DIAGNOSIS — M25551 Pain in right hip: Secondary | ICD-10-CM

## 2016-01-09 DIAGNOSIS — G8929 Other chronic pain: Secondary | ICD-10-CM | POA: Insufficient documentation

## 2016-01-09 DIAGNOSIS — Y998 Other external cause status: Secondary | ICD-10-CM | POA: Insufficient documentation

## 2016-01-09 MED ORDER — KETOROLAC TROMETHAMINE 60 MG/2ML IM SOLN
60.0000 mg | Freq: Once | INTRAMUSCULAR | Status: AC
  Administered 2016-01-09: 60 mg via INTRAMUSCULAR
  Filled 2016-01-09: qty 2

## 2016-01-09 MED ORDER — OXYCODONE-ACETAMINOPHEN 5-325 MG PO TABS
ORAL_TABLET | ORAL | Status: AC
  Filled 2016-01-09: qty 1

## 2016-01-09 MED ORDER — OXYCODONE-ACETAMINOPHEN 5-325 MG PO TABS
1.0000 | ORAL_TABLET | Freq: Once | ORAL | Status: AC
  Administered 2016-01-09: 1 via ORAL

## 2016-01-09 MED ORDER — OXYCODONE-ACETAMINOPHEN 5-325 MG PO TABS: 1.0000 | ORAL_TABLET | ORAL | Status: DC | PRN

## 2016-01-09 MED ORDER — IBUPROFEN 800 MG PO TABS: 800.0000 mg | ORAL_TABLET | Freq: Three times a day (TID) | ORAL | Status: DC

## 2016-01-09 NOTE — ED Notes (Signed)
Pt needs  A stretcher but none are available.  Pt crying with pain  Coming back from xray

## 2016-01-09 NOTE — ED Notes (Signed)
No answer when called in waiting room. 

## 2016-01-09 NOTE — Discharge Instructions (Signed)
Knee Pain Knee pain is a very common symptom and can have many causes. Knee pain often goes away when you follow your health care provider's instructions for relieving pain and discomfort at home. However, knee pain can develop into a condition that needs treatment. Some conditions may include:  Arthritis caused by wear and tear (osteoarthritis).  Arthritis caused by swelling and irritation (rheumatoid arthritis or gout).  A cyst or growth in your knee.  An infection in your knee joint.  An injury that will not heal.  Damage, swelling, or irritation of the tissues that support your knee (torn ligaments or tendinitis). If your knee pain continues, additional tests may be ordered to diagnose your condition. Tests may include X-rays or other imaging studies of your knee. You may also need to have fluid removed from your knee. Treatment for ongoing knee pain depends on the cause, but treatment may include:  Medicines to relieve pain or swelling.  Steroid injections in your knee.  Physical therapy.  Surgery. HOME CARE INSTRUCTIONS  Take medicines only as directed by your health care provider.  Rest your knee and keep it raised (elevated) while you are resting.  Do not do things that cause or worsen pain.  Avoid high-impact activities or exercises, such as running, jumping rope, or doing jumping jacks.  Apply ice to the knee area:  Put ice in a plastic bag.  Place a towel between your skin and the bag.  Leave the ice on for 20 minutes, 2-3 times a day.  Ask your health care provider if you should wear an elastic knee support.  Keep a pillow under your knee when you sleep.  Lose weight if you are overweight. Extra weight can put pressure on your knee.  Do not use any tobacco products, including cigarettes, chewing tobacco, or electronic cigarettes. If you need help quitting, ask your health care provider. Smoking may slow the healing of any bone and joint problems that you may  have. SEEK MEDICAL CARE IF:  Your knee pain continues, changes, or gets worse.  You have a fever along with knee pain.  Your knee buckles or locks up.  Your knee becomes more swollen. SEEK IMMEDIATE MEDICAL CARE IF:   Your knee joint feels hot to the touch.  You have chest pain or trouble breathing.   This information is not intended to replace advice given to you by your health care provider. Make sure you discuss any questions you have with your health care provider.   Document Released: 10/10/2007 Document Revised: 01/03/2015 Document Reviewed: 07/29/2014 Elsevier Interactive Patient Education 2016 [employer] Hip Pain Your hip is the joint between your upper legs and your lower pelvis. The bones, cartilage, tendons, and muscles of your hip joint perform a lot of work each day supporting your body weight and allowing you to move around. Hip pain can range from a minor ache to severe pain in one or both of your hips. Pain may be felt on the inside of the hip joint near the groin, or the outside near the buttocks and upper thigh. You may have swelling or stiffness as well.  HOME CARE INSTRUCTIONS   Take medicines only as directed by your health care provider.  Apply ice to the injured area:  Put ice in a plastic bag.  Place a towel between your skin and the bag.  Leave the ice on for 15-20 minutes at a time, 3-4 times a day.  Keep your leg raised (elevated) when   possible to lessen swelling.  Avoid activities that cause pain.  Follow specific exercises as directed by your health care provider.  Sleep with a pillow between your legs on your most comfortable side.  Record how often you have hip pain, the location of the pain, and what it feels like. SEEK MEDICAL CARE IF:   You are unable to put weight on your leg.  Your hip is red or swollen or very tender to touch.  Your pain or swelling continues or worsens after 1 week.  You have increasing difficulty  walking.  You have a fever. SEEK IMMEDIATE MEDICAL CARE IF:   You have fallen.  You have a sudden increase in pain and swelling in your hip. MAKE SURE YOU:   Understand these instructions.  Will watch your condition.  Will get help right away if you are not doing well or get worse.   This information is not intended to replace advice given to you by your health care provider. Make sure you discuss any questions you have with your health care provider.   Document Released: 06/02/2010 Document Revised: 01/03/2015 Document Reviewed: 08/09/2013 Elsevier Interactive Patient Education 2016 [employer]  

## 2016-01-09 NOTE — ED Notes (Signed)
Patient also states his right knee pain is worse since the fall.

## 2016-01-09 NOTE — ED Provider Notes (Signed)
CSN: [id number]     Arrival date & time 01/09/16  2033 History  By signing my name below, I, [name], attest that this documentation has been prepared under the direction and in the presence of Cheri Fowler, PA-C. Electronically Signed: [name], ED Scribe. 01/09/2016. 11:10 PM.    Chief Complaint  Patient presents with  . Fall  . Hip Pain   The history is provided by the patient. No language interpreter was used.    HPI Comments: Sean Mccoy is a 54 y.o. male with h/o HTN, arthritis, chronic right hip pain, avascular necrosis of right hip who presents to the Emergency Department complaining of moderate right knee pain and an exacerbation of chronic right hip pain s/p mechanical fall tonight. Pt states he was walking in a parking lot when he lost his balance and fell on his right hip. No LOC, head injury. Pt denies taking OTC medications at home to improve symptoms. Pt reports he takes Percocet 3x/day for his chronic hip pain, but has recently run out. He is followed by orthopedics and is scheduled to have hip replacement in 11 days by Dr. Magnus Ivan. Pt denies CP, numbness, tingling, focal weakness, groin pain, leg swelling, additional injuries.   Past Medical History  Diagnosis Date  . Hypertension   . Arthritis   . Knee fracture   . Groin injury   . Ankle injury   . Avascular necrosis [facility])    Past Surgical History  Procedure Laterality Date  . Knee surgery     Family History  Problem Relation Age of Onset  . Diabetes Mother   . Hypertension Mother   . Diabetes Father   . Hypertension Father    Social History  Substance Use Topics  . Smoking status: Never Smoker   . Smokeless tobacco: Never Used  . Alcohol Use: No     Comment: occ    Review of Systems A complete 10 system review of systems was obtained and all systems are negative except as noted in the HPI and PMH.    Allergies  Review of patient's allergies indicates no known allergies.  Home Medications    Prior to Admission medications   Medication Sig Start Date End Date Taking? Authorizing Provider  ibuprofen (ADVIL,MOTRIN) 800 MG tablet Take 1 tablet (800 mg total) by mouth 3 (three) times daily. 01/09/16   Cheri Fowler, PA-C  oxyCODONE-acetaminophen (PERCOCET/ROXICET) 5-325 MG tablet Take 1 tablet by mouth every 4 (four) hours as needed for severe pain. 01/09/16   [name], PA-C   BP 137/95 mmHg  Pulse 105  Temp(Src) 98 F (36.7 C) (Oral)  Resp 20  Ht 5\' 10"  (1.778 m)  Wt 72.576 kg  BMI 22.96 kg/m2  SpO2 97% Physical Exam  Constitutional: He is oriented to person, place, and time. He appears well-developed and well-nourished.  HENT:  Head: Normocephalic and atraumatic.  Eyes: Conjunctivae and EOM are normal. Pupils are equal, round, and reactive to light.  Neck: Normal range of motion. Neck supple.  Cardiovascular: Normal rate, regular rhythm and normal heart sounds.  Exam reveals no gallop and no friction rub.   No murmur heard. Pulses:      Posterior tibial pulses are 2+ on the right side, and 2+ on the left side.  2+ PT pulses bilaterally.   Pulmonary/Chest: Effort normal and breath sounds normal. No respiratory distress. He has no wheezes. He has no rales. He exhibits no tenderness.  Lungs CTA bilaterally.   Abdominal: Soft.  Bowel sounds are normal. He exhibits no distension. There is no tenderness.  Musculoskeletal: Normal range of motion.       Right hip: He exhibits tenderness. He exhibits normal strength, no swelling, no crepitus, no deformity and no laceration.       Right knee: He exhibits no swelling, no effusion, no ecchymosis, no deformity, no laceration and no erythema. Tenderness found. Medial joint line and lateral joint line tenderness noted.       Legs: Compartments are soft and compressible. Right knee TTP along medial and lateral joint lines. No appreciable effusion. Right hip minimally TTP.   Neurological: He is alert and oriented to person, place, and time.   Strength and sensation intact bilaterally throughout the lower extremities.   Skin: Skin is warm and dry.  Psychiatric: He has a normal mood and affect. His behavior is normal.  Nursing note and vitals reviewed.   ED Course  Procedures (including critical care time) DIAGNOSTIC STUDIES: Oxygen Saturation is 97% on RA, normal by my interpretation.    COORDINATION OF CARE: 11:06 PM Discussed treatment plan with pt at bedside which includes XR and pt agreed to plan.   Imaging Review Dg Pelvis 1-2 Views  01/09/2016  CLINICAL DATA:  55 year old male with fall and right hip and lower extremity pain. EXAM: RIGHT KNEE - COMPLETE 4+ VIEW; RIGHT FEMUR 2 VIEWS; PELVIS - 1-2 VIEW COMPARISON:  None. FINDINGS: There is sclerotic changes of the right femoral head with collapse and flattening of the right femoral head cortex compatible with avascular necrosis and fracture. No other pelvic acute fracture or dislocation noted. There is diffuse osteopenia of the femur. There is S serpiginous sclerotic lesion involving the distal femoral metadiaphysis compatible with bone infarcts. No acute fracture. There is no dislocation of the knee. There is a focal area of cortical irregularity involving the proximal tibia, likely related to an old healed fracture deformity. No definite acute fracture identified, however evaluation is limited due to osteopenia. CT may provide better evaluation if there is high clinical suspicion for fracture. IMPRESSION: Avascular necrosis of the right femoral head with cortical collapse. Distal femoral bone infarct. Proximal tibial cortical irregularity, likely chronic. Advanced osteopenia.  No definite acute fracture. Electronically Signed   By: Elgie Collard M.D.   On: 01/09/2016 22:51   Dg Knee Complete 4 Views Right  01/09/2016  CLINICAL DATA:  55 year old male with fall and right hip and lower extremity pain. EXAM: RIGHT KNEE - COMPLETE 4+ VIEW; RIGHT FEMUR 2 VIEWS; PELVIS - 1-2 VIEW  COMPARISON:  None. FINDINGS: There is sclerotic changes of the right femoral head with collapse and flattening of the right femoral head cortex compatible with avascular necrosis and fracture. No other pelvic acute fracture or dislocation noted. There is diffuse osteopenia of the femur. There is S serpiginous sclerotic lesion involving the distal femoral metadiaphysis compatible with bone infarcts. No acute fracture. There is no dislocation of the knee. There is a focal area of cortical irregularity involving the proximal tibia, likely related to an old healed fracture deformity. No definite acute fracture identified, however evaluation is limited due to osteopenia. CT may provide better evaluation if there is high clinical suspicion for fracture. IMPRESSION: Avascular necrosis of the right femoral head with cortical collapse. Distal femoral bone infarct. Proximal tibial cortical irregularity, likely chronic. Advanced osteopenia.  No definite acute fracture. Electronically Signed   By: Elgie Collard M.D.   On: 01/09/2016 22:51   Dg Femur, Min 2 Views  Right  01/09/2016  CLINICAL DATA:  55 year old male with fall and right hip and lower extremity pain. EXAM: RIGHT KNEE - COMPLETE 4+ VIEW; RIGHT FEMUR 2 VIEWS; PELVIS - 1-2 VIEW COMPARISON:  None. FINDINGS: There is sclerotic changes of the right femoral head with collapse and flattening of the right femoral head cortex compatible with avascular necrosis and fracture. No other pelvic acute fracture or dislocation noted. There is diffuse osteopenia of the femur. There is S serpiginous sclerotic lesion involving the distal femoral metadiaphysis compatible with bone infarcts. No acute fracture. There is no dislocation of the knee. There is a focal area of cortical irregularity involving the proximal tibia, likely related to an old healed fracture deformity. No definite acute fracture identified, however evaluation is limited due to osteopenia. CT may provide better  evaluation if there is high clinical suspicion for fracture. IMPRESSION: Avascular necrosis of the right femoral head with cortical collapse. Distal femoral bone infarct. Proximal tibial cortical irregularity, likely chronic. Advanced osteopenia.  No definite acute fracture. Electronically Signed   By: Elgie Collard M.D.   On: 01/09/2016 22:51   I have personally reviewed and evaluated these images as part of my medical decision-making.   MDM   Final diagnoses:  Fall, initial encounter  Right knee pain  Right hip pain   Filed Vitals:   01/09/16 2103  BP: 137/95  Pulse: 105  Temp: 98 F (36.7 C)  Resp: 20    Meds given in ED:  Medications  oxyCODONE-acetaminophen (PERCOCET/ROXICET) 5-325 MG per tablet (not administered)  oxyCODONE-acetaminophen (PERCOCET/ROXICET) 5-325 MG per tablet 1 tablet (1 tablet Oral Given 01/09/16 2110)  ketorolac (TORADOL) injection 60 mg (60 mg Intramuscular Given 01/09/16 2318)    New Prescriptions   IBUPROFEN (ADVIL,MOTRIN) 800 MG TABLET    Take 1 tablet (800 mg total) by mouth 3 (three) times daily.   OXYCODONE-ACETAMINOPHEN (PERCOCET/ROXICET) 5-325 MG TABLET    Take 1 tablet by mouth every 4 (four) hours as needed for severe pain.    Patient X-Ray negative for obvious acute fracture or dislocation. NVI.  Findings consistent with existing avascular necrosis. Pt advised to follow up with orthopedics for surgery as scheduled, and call Dr. Magnus Ivan Monday. Patient given knee immobilizer while in ED.  Patient will be discharged home & is agreeable with above plan. Returns precautions discussed. Pt appears safe for discharge.   I personally performed the services described in this documentation, which was scribed in my presence. The recorded information has been reviewed and is accurate.   Cheri Fowler, PA-C 01/10/16 6256  Linwood Dibbles, MD 01/10/16 [phone]

## 2016-01-09 NOTE — ED Notes (Signed)
Pt called for triage with no answer 

## 2016-01-09 NOTE — ED Notes (Signed)
Patient here with complaint of right hip pain. States history of avascular necrosis of right hip. States tonight he lost his balance and twisted that hip. Now his pain is much worse. Arrives with a crutch. States that he is scheduled for a hip replacement later this month.

## 2016-01-09 NOTE — ED Notes (Signed)
Pt called for triage x2 no answer. 

## 2016-01-09 NOTE — ED Notes (Signed)
Food given to the pt 

## 2016-01-09 NOTE — ED Notes (Signed)
Pt continues to cry.  Injection given for pain.Marland Kitchen

## 2016-01-09 NOTE — ED Notes (Signed)
No stretcher available

## 2016-01-09 NOTE — ED Notes (Signed)
The pt has no way home.  He will have to stay in the waiting room once he leaves the ft room he has no way home until the buses run

## 2016-01-10 NOTE — ED Notes (Signed)
Pt was d/cd hours ago  Pt allowed to sleep on stretcher placed for him until we closed this area.  Getting ready to close in the next 45 minutes.  Pt will wait in waiting room until am when buses start running

## 2016-01-12 ENCOUNTER — Ambulatory Visit: Payer: Self-pay | Admitting: Internal Medicine

## 2016-01-12 ENCOUNTER — Inpatient Hospital Stay (HOSPITAL_COMMUNITY): Admission: RE | Admit: 2016-01-12 | Payer: Self-pay | Source: Ambulatory Visit

## 2016-01-12 NOTE — Pre-Procedure Instructions (Signed)
    Sean Mccoy  01/12/2016      [facility] - [place], [place] - [address] [place] [address] Phone: [phone] Fax: [phone]    Your procedure is scheduled on 01/20/16.  Report to [name] at 1230 pm  Call this number if you have problems the morning of surgery:  [phone]   Remember:  Do not eat food or drink liquids after midnight.  Take these medicines the morning of surgery with A SIP OF WATER --oxycodone   Do not wear jewelry, make-up or nail polish.  Do not wear lotions, powders, or perfumes.  You may wear deodorant.  Do not shave 48 hours prior to surgery.  Men may shave face and neck.  Do not bring valuables to the hospital.  [facility] is not responsible for any belongings or valuables.  Contacts, dentures or bridgework may not be worn into surgery.  Leave your suitcase in the car.  After surgery it may be brought to your room.  For patients admitted to the hospital, discharge time will be determined by your treatment team.  Patients discharged the day of surgery will not be allowed to drive home.   Name and phone number of your driver:   Special instructions:    Please read over the following fact sheets that you were given. Pain Booklet, Coughing and Deep Breathing, Total Joint Packet and MRSA Information

## 2016-01-13 ENCOUNTER — Encounter (HOSPITAL_COMMUNITY): Payer: Self-pay

## 2016-01-13 ENCOUNTER — Encounter (HOSPITAL_COMMUNITY)
Admission: RE | Admit: 2016-01-13 | Discharge: 2016-01-13 | Disposition: A | Discharge status: Home / Self Care | Payer: Medicaid Other | Source: Ambulatory Visit | Attending: Orthopaedic Surgery | Admitting: Orthopaedic Surgery

## 2016-01-13 DIAGNOSIS — M879 Osteonecrosis, unspecified: Secondary | ICD-10-CM | POA: Diagnosis not present

## 2016-01-13 DIAGNOSIS — Z01812 Encounter for preprocedural laboratory examination: Secondary | ICD-10-CM | POA: Insufficient documentation

## 2016-01-13 HISTORY — DX: Anxiety disorder, unspecified: F41.9

## 2016-01-13 HISTORY — DX: Post-traumatic stress disorder, unspecified: F43.10

## 2016-01-13 HISTORY — DX: Major depressive disorder, single episode, unspecified: F32.9

## 2016-01-13 HISTORY — DX: Depression, unspecified: F32.A

## 2016-01-13 LAB — BASIC METABOLIC PANEL
Anion gap: 15 (ref 5–15)
BUN: 6 mg/dL (ref 6–20)
CALCIUM: 10 mg/dL (ref 8.9–10.3)
CHLORIDE: 100 mmol/L — AB (ref 101–111)
CO2: 17 mmol/L — AB (ref 22–32)
CREATININE: 0.74 mg/dL (ref 0.61–1.24)
GFR calc Af Amer: >60 mL/min (ref >60)
GFR calc non Af Amer: >60 mL/min (ref >60)
GLUCOSE: 110 mg/dL — AB (ref 65–99)
Potassium: 3.9 mmol/L (ref 3.5–5.1)
Sodium: 132 mmol/L — ABNORMAL LOW (ref 135–145)

## 2016-01-13 LAB — SURGICAL PCR SCREEN
MRSA, PCR: NEGATIVE
Staphylococcus aureus: NEGATIVE

## 2016-01-13 LAB — CBC
HCT: 39.6 % (ref 39.0–52.0)
Hemoglobin: 13.3 g/dL (ref 13.0–17.0)
MCH: 32 pg (ref 26.0–34.0)
MCHC: 33.6 g/dL (ref 30.0–36.0)
MCV: 95.2 fL (ref 78.0–100.0)
PLATELETS: 233 10*3/uL (ref 150–400)
RBC: 4.16 MIL/uL — ABNORMAL LOW (ref 4.22–5.81)
RDW: 14.2 % (ref 11.5–15.5)
WBC: 6 10*3/uL (ref 4.0–10.5)

## 2016-01-19 MED ORDER — SODIUM CHLORIDE 0.9 % IV SOLN
1000.0000 mg | INTRAVENOUS | Status: AC
  Administered 2016-01-20: 1000 mg via INTRAVENOUS
  Filled 2016-01-19: qty 10

## 2016-01-19 MED ORDER — SODIUM CHLORIDE 0.9 % IV SOLN
1000.0000 mg | INTRAVENOUS | Status: DC
  Filled 2016-01-19: qty 10

## 2016-01-19 MED ORDER — CEFAZOLIN SODIUM-DEXTROSE 2-3 GM-% IV SOLR: 2.0000 g | INTRAVENOUS | Status: DC

## 2016-01-19 MED ORDER — CEFAZOLIN SODIUM-DEXTROSE 2-3 GM-% IV SOLR
2.0000 g | INTRAVENOUS | Status: AC
  Administered 2016-01-20: 2 g via INTRAVENOUS
  Filled 2016-01-19: qty 50

## 2016-01-20 ENCOUNTER — Inpatient Hospital Stay (HOSPITAL_COMMUNITY): Payer: Medicaid Other

## 2016-01-20 ENCOUNTER — Inpatient Hospital Stay (HOSPITAL_COMMUNITY): Payer: Medicaid Other | Admitting: Anesthesiology

## 2016-01-20 ENCOUNTER — Encounter (HOSPITAL_COMMUNITY)
Admission: RE | Disposition: A | Discharge status: Home / Self Care | Payer: Self-pay | Source: Ambulatory Visit | Attending: Orthopaedic Surgery

## 2016-01-20 ENCOUNTER — Inpatient Hospital Stay (HOSPITAL_COMMUNITY)
Admission: RE | Admit: 2016-01-20 | Discharge: 2016-01-23 | DRG: 470 | Disposition: A | Discharge status: Home / Self Care | Payer: Medicaid Other | Source: Ambulatory Visit | Attending: Orthopaedic Surgery | Admitting: Orthopaedic Surgery

## 2016-01-20 ENCOUNTER — Encounter (HOSPITAL_COMMUNITY): Payer: Self-pay | Admitting: *Deleted

## 2016-01-20 DIAGNOSIS — M25451 Effusion, right hip: Secondary | ICD-10-CM | POA: Diagnosis present

## 2016-01-20 DIAGNOSIS — I10 Essential (primary) hypertension: Secondary | ICD-10-CM | POA: Diagnosis present

## 2016-01-20 DIAGNOSIS — F329 Major depressive disorder, single episode, unspecified: Secondary | ICD-10-CM | POA: Diagnosis present

## 2016-01-20 DIAGNOSIS — Z791 Long term (current) use of non-steroidal anti-inflammatories (NSAID): Secondary | ICD-10-CM | POA: Diagnosis not present

## 2016-01-20 DIAGNOSIS — F1010 Alcohol abuse, uncomplicated: Secondary | ICD-10-CM | POA: Diagnosis present

## 2016-01-20 DIAGNOSIS — F4310 Post-traumatic stress disorder, unspecified: Secondary | ICD-10-CM | POA: Diagnosis present

## 2016-01-20 DIAGNOSIS — M25551 Pain in right hip: Secondary | ICD-10-CM | POA: Diagnosis present

## 2016-01-20 DIAGNOSIS — Z419 Encounter for procedure for purposes other than remedying health state, unspecified: Secondary | ICD-10-CM

## 2016-01-20 DIAGNOSIS — M87188 Osteonecrosis due to drugs, other site: Secondary | ICD-10-CM | POA: Diagnosis present

## 2016-01-20 DIAGNOSIS — F419 Anxiety disorder, unspecified: Secondary | ICD-10-CM | POA: Diagnosis present

## 2016-01-20 DIAGNOSIS — M1990 Unspecified osteoarthritis, unspecified site: Secondary | ICD-10-CM | POA: Diagnosis present

## 2016-01-20 DIAGNOSIS — Z87898 Personal history of other specified conditions: Secondary | ICD-10-CM

## 2016-01-20 DIAGNOSIS — Z96641 Presence of right artificial hip joint: Secondary | ICD-10-CM

## 2016-01-20 DIAGNOSIS — M87051 Idiopathic aseptic necrosis of right femur: Secondary | ICD-10-CM

## 2016-01-20 HISTORY — PX: TOTAL HIP ARTHROPLASTY: SHX124

## 2016-01-20 SURGERY — ARTHROPLASTY, HIP, TOTAL, ANTERIOR APPROACH
Anesthesia: General | Site: Hip | Laterality: Right

## 2016-01-20 MED ORDER — LACTATED RINGERS IV SOLN
INTRAVENOUS | Status: DC | PRN
  Administered 2016-01-20 (×2): via INTRAVENOUS

## 2016-01-20 MED ORDER — MIDAZOLAM HCL 2 MG/2ML IJ SOLN
INTRAMUSCULAR | Status: AC
  Filled 2016-01-20: qty 2

## 2016-01-20 MED ORDER — ASPIRIN EC 325 MG PO TBEC
325.0000 mg | DELAYED_RELEASE_TABLET | Freq: Two times a day (BID) | ORAL | Status: DC
  Administered 2016-01-20 – 2016-01-23 (×7): 325 mg via ORAL
  Filled 2016-01-20 (×7): qty 1

## 2016-01-20 MED ORDER — FENTANYL CITRATE (PF) 250 MCG/5ML IJ SOLN
INTRAMUSCULAR | Status: AC
  Filled 2016-01-20: qty 5

## 2016-01-20 MED ORDER — FENTANYL CITRATE (PF) 100 MCG/2ML IJ SOLN
INTRAMUSCULAR | Status: DC | PRN
  Administered 2016-01-20 (×2): 75 ug via INTRAVENOUS
  Administered 2016-01-20: 100 ug via INTRAVENOUS

## 2016-01-20 MED ORDER — METOCLOPRAMIDE HCL 5 MG/ML IJ SOLN: 5.0000 mg | Freq: Three times a day (TID) | INTRAMUSCULAR | Status: DC | PRN

## 2016-01-20 MED ORDER — LIDOCAINE HCL (CARDIAC) 20 MG/ML IV SOLN
INTRAVENOUS | Status: AC
  Filled 2016-01-20: qty 5

## 2016-01-20 MED ORDER — ONDANSETRON HCL 4 MG PO TABS: 4.0000 mg | ORAL_TABLET | Freq: Four times a day (QID) | ORAL | Status: DC | PRN

## 2016-01-20 MED ORDER — DIPHENHYDRAMINE HCL 12.5 MG/5ML PO ELIX
12.5000 mg | ORAL_SOLUTION | ORAL | Status: DC | PRN
  Administered 2016-01-23: 25 mg via ORAL
  Filled 2016-01-20: qty 10

## 2016-01-20 MED ORDER — SODIUM CHLORIDE 0.9 % IR SOLN
Status: DC | PRN
  Administered 2016-01-20: 1000 mL

## 2016-01-20 MED ORDER — MENTHOL 3 MG MT LOZG: 1.0000 | LOZENGE | OROMUCOSAL | Status: DC | PRN

## 2016-01-20 MED ORDER — ACETAMINOPHEN 325 MG PO TABS: 650.0000 mg | ORAL_TABLET | Freq: Four times a day (QID) | ORAL | Status: DC | PRN

## 2016-01-20 MED ORDER — PROPOFOL 10 MG/ML IV BOLUS
INTRAVENOUS | Status: AC
  Filled 2016-01-20: qty 20

## 2016-01-20 MED ORDER — HYDROMORPHONE HCL 1 MG/ML IJ SOLN
1.0000 mg | INTRAMUSCULAR | Status: DC | PRN
  Administered 2016-01-20 – 2016-01-21 (×3): 1 mg via INTRAVENOUS
  Filled 2016-01-20 (×3): qty 1

## 2016-01-20 MED ORDER — MIDAZOLAM HCL 5 MG/5ML IJ SOLN
INTRAMUSCULAR | Status: DC | PRN
  Administered 2016-01-20: 1 mg via INTRAVENOUS

## 2016-01-20 MED ORDER — 0.9 % SODIUM CHLORIDE (POUR BTL) OPTIME
TOPICAL | Status: DC | PRN
  Administered 2016-01-20: 1000 mL

## 2016-01-20 MED ORDER — CEFAZOLIN SODIUM 1-5 GM-% IV SOLN
1.0000 g | Freq: Four times a day (QID) | INTRAVENOUS | Status: AC
  Administered 2016-01-20 – 2016-01-21 (×2): 1 g via INTRAVENOUS
  Filled 2016-01-20 (×3): qty 50

## 2016-01-20 MED ORDER — ONDANSETRON HCL 4 MG/2ML IJ SOLN
INTRAMUSCULAR | Status: DC | PRN
  Administered 2016-01-20: 4 mg via INTRAVENOUS

## 2016-01-20 MED ORDER — ACETAMINOPHEN 650 MG RE SUPP: 650.0000 mg | Freq: Four times a day (QID) | RECTAL | Status: DC | PRN

## 2016-01-20 MED ORDER — NEOSTIGMINE METHYLSULFATE 10 MG/10ML IV SOLN
INTRAVENOUS | Status: DC | PRN
  Administered 2016-01-20: 3 mg via INTRAVENOUS

## 2016-01-20 MED ORDER — ONDANSETRON HCL 4 MG/2ML IJ SOLN: 4.0000 mg | Freq: Four times a day (QID) | INTRAMUSCULAR | Status: DC | PRN

## 2016-01-20 MED ORDER — KETOROLAC TROMETHAMINE 15 MG/ML IJ SOLN
7.5000 mg | Freq: Four times a day (QID) | INTRAMUSCULAR | Status: AC
  Administered 2016-01-20 – 2016-01-21 (×4): 7.5 mg via INTRAVENOUS
  Filled 2016-01-20 (×4): qty 1

## 2016-01-20 MED ORDER — ALUM & MAG HYDROXIDE-SIMETH 200-200-20 MG/5ML PO SUSP: 30.0000 mL | ORAL | Status: DC | PRN

## 2016-01-20 MED ORDER — LACTATED RINGERS IV SOLN
INTRAVENOUS | Status: DC
  Administered 2016-01-20: 13:00:00 via INTRAVENOUS

## 2016-01-20 MED ORDER — SODIUM CHLORIDE 0.9 % IV SOLN
INTRAVENOUS | Status: DC
  Administered 2016-01-20: 23:00:00 via INTRAVENOUS

## 2016-01-20 MED ORDER — METOCLOPRAMIDE HCL 5 MG PO TABS: 5.0000 mg | ORAL_TABLET | Freq: Three times a day (TID) | ORAL | Status: DC | PRN

## 2016-01-20 MED ORDER — PROPOFOL 10 MG/ML IV BOLUS
INTRAVENOUS | Status: DC | PRN
  Administered 2016-01-20: 200 mg via INTRAVENOUS

## 2016-01-20 MED ORDER — DOCUSATE SODIUM 100 MG PO CAPS
100.0000 mg | ORAL_CAPSULE | Freq: Two times a day (BID) | ORAL | Status: DC
  Administered 2016-01-20 – 2016-01-23 (×7): 100 mg via ORAL
  Filled 2016-01-20 (×7): qty 1

## 2016-01-20 MED ORDER — ZOLPIDEM TARTRATE 5 MG PO TABS: 5.0000 mg | ORAL_TABLET | Freq: Every evening | ORAL | Status: DC | PRN

## 2016-01-20 MED ORDER — METHOCARBAMOL 500 MG PO TABS
500.0000 mg | ORAL_TABLET | Freq: Four times a day (QID) | ORAL | Status: DC | PRN
  Administered 2016-01-20 – 2016-01-23 (×6): 500 mg via ORAL
  Filled 2016-01-20 (×6): qty 1

## 2016-01-20 MED ORDER — OXYCODONE HCL 5 MG PO TABS
5.0000 mg | ORAL_TABLET | ORAL | Status: DC | PRN
  Administered 2016-01-20 – 2016-01-21 (×6): 15 mg via ORAL
  Administered 2016-01-21: 10 mg via ORAL
  Administered 2016-01-22 – 2016-01-23 (×6): 15 mg via ORAL
  Administered 2016-01-23: 5 mg via ORAL
  Administered 2016-01-23: 15 mg via ORAL
  Administered 2016-01-23: 10 mg via ORAL
  Administered 2016-01-23 (×3): 15 mg via ORAL
  Filled 2016-01-20 (×15): qty 3
  Filled 2016-01-20: qty 2
  Filled 2016-01-20 (×2): qty 3

## 2016-01-20 MED ORDER — LIDOCAINE HCL (CARDIAC) 20 MG/ML IV SOLN
INTRAVENOUS | Status: DC | PRN
  Administered 2016-01-20: 100 mg via INTRAVENOUS

## 2016-01-20 MED ORDER — METHOCARBAMOL 1000 MG/10ML IJ SOLN
500.0000 mg | Freq: Four times a day (QID) | INTRAVENOUS | Status: DC | PRN
  Filled 2016-01-20: qty 5

## 2016-01-20 MED ORDER — PANTOPRAZOLE SODIUM 40 MG PO TBEC
40.0000 mg | DELAYED_RELEASE_TABLET | Freq: Every day | ORAL | Status: DC
  Administered 2016-01-20 – 2016-01-23 (×4): 40 mg via ORAL
  Filled 2016-01-20 (×4): qty 1

## 2016-01-20 MED ORDER — PHENOL 1.4 % MT LIQD: 1.0000 | OROMUCOSAL | Status: DC | PRN

## 2016-01-20 MED ORDER — ROCURONIUM BROMIDE 50 MG/5ML IV SOLN
INTRAVENOUS | Status: AC
  Filled 2016-01-20: qty 1

## 2016-01-20 MED ORDER — GLYCOPYRROLATE 0.2 MG/ML IJ SOLN
INTRAMUSCULAR | Status: DC | PRN
  Administered 2016-01-20: 0.4 mg via INTRAVENOUS

## 2016-01-20 MED ORDER — ROCURONIUM BROMIDE 100 MG/10ML IV SOLN
INTRAVENOUS | Status: DC | PRN
  Administered 2016-01-20: 10 mg via INTRAVENOUS
  Administered 2016-01-20: 50 mg via INTRAVENOUS

## 2016-01-20 SURGICAL SUPPLY — 57 items
APL SKNCLS STERI-STRIP NONHPOA (GAUZE/BANDAGES/DRESSINGS) ×1
BENZOIN TINCTURE PRP APPL 2/3 (GAUZE/BANDAGES/DRESSINGS) ×3 IMPLANT
BLADE SAW SGTL 18X1.27X75 (BLADE) ×2 IMPLANT
BLADE SAW SGTL 18X1.27X75MM (BLADE) ×1
BLADE SURG ROTATE 9660 (MISCELLANEOUS) IMPLANT
CAPT HIP TOTAL 2 ×2 IMPLANT
CELLS DAT CNTRL 66122 CELL SVR (MISCELLANEOUS) ×1 IMPLANT
CLOSURE STERI-STRIP 1/2X4 (GAUZE/BANDAGES/DRESSINGS) ×1
CLOSURE WOUND 1/2 X4 (GAUZE/BANDAGES/DRESSINGS) ×1
CLSR STERI-STRIP ANTIMIC 1/2X4 (GAUZE/BANDAGES/DRESSINGS) ×1 IMPLANT
COVER SURGICAL LIGHT HANDLE (MISCELLANEOUS) ×3 IMPLANT
DRAPE C-ARM 42X72 X-RAY (DRAPES) ×3 IMPLANT
DRAPE STERI IOBAN 125X83 (DRAPES) ×3 IMPLANT
DRAPE U-SHAPE 47X51 STRL (DRAPES) ×9 IMPLANT
DRSG AQUACEL AG ADV 3.5X10 (GAUZE/BANDAGES/DRESSINGS) ×3 IMPLANT
DURAPREP 26ML APPLICATOR (WOUND CARE) ×3 IMPLANT
ELECT BLADE 4.0 EZ CLEAN MEGAD (MISCELLANEOUS) ×3
ELECT BLADE 6.5 EXT (BLADE) IMPLANT
ELECT REM PT RETURN 9FT ADLT (ELECTROSURGICAL) ×3
ELECTRODE BLDE 4.0 EZ CLN MEGD (MISCELLANEOUS) ×1 IMPLANT
ELECTRODE REM PT RTRN 9FT ADLT (ELECTROSURGICAL) ×1 IMPLANT
FACESHIELD WRAPAROUND (MASK) ×9 IMPLANT
FACESHIELD WRAPAROUND OR TEAM (MASK) ×2 IMPLANT
GLOVE BIOGEL PI IND STRL 8 (GLOVE) ×2 IMPLANT
GLOVE BIOGEL PI INDICATOR 8 (GLOVE) ×6
GLOVE ECLIPSE 8.0 STRL XLNG CF (GLOVE) ×3 IMPLANT
GLOVE ORTHO TXT STRL SZ7.5 (GLOVE) ×6 IMPLANT
GLOVE SURG SS PI 8.0 STRL IVOR (GLOVE) ×2 IMPLANT
GOWN STRL REUS W/ TWL LRG LVL3 (GOWN DISPOSABLE) ×2 IMPLANT
GOWN STRL REUS W/ TWL XL LVL3 (GOWN DISPOSABLE) ×2 IMPLANT
GOWN STRL REUS W/TWL LRG LVL3 (GOWN DISPOSABLE) ×6
GOWN STRL REUS W/TWL XL LVL3 (GOWN DISPOSABLE) ×6
HANDPIECE INTERPULSE COAX TIP (DISPOSABLE) ×3
KIT BASIN OR (CUSTOM PROCEDURE TRAY) ×3 IMPLANT
KIT ROOM TURNOVER OR (KITS) ×3 IMPLANT
MANIFOLD NEPTUNE II (INSTRUMENTS) ×3 IMPLANT
NS IRRIG 1000ML POUR BTL (IV SOLUTION) ×3 IMPLANT
PACK TOTAL JOINT (CUSTOM PROCEDURE TRAY) ×3 IMPLANT
PACK UNIVERSAL I (CUSTOM PROCEDURE TRAY) ×3 IMPLANT
PAD ARMBOARD 7.5X6 YLW CONV (MISCELLANEOUS) ×3 IMPLANT
RETRACTOR WND ALEXIS 18 MED (MISCELLANEOUS) ×1 IMPLANT
RTRCTR WOUND ALEXIS 18CM MED (MISCELLANEOUS) ×3
SET HNDPC FAN SPRY TIP SCT (DISPOSABLE) ×1 IMPLANT
STAPLER VISISTAT 35W (STAPLE) ×2 IMPLANT
STRIP CLOSURE SKIN 1/2X4 (GAUZE/BANDAGES/DRESSINGS) ×3 IMPLANT
SUT ETHIBOND NAB CT1 #1 30IN (SUTURE) ×3 IMPLANT
SUT MNCRL AB 4-0 PS2 18 (SUTURE) ×2 IMPLANT
SUT VIC AB 0 CT1 27 (SUTURE) ×3
SUT VIC AB 0 CT1 27XBRD ANBCTR (SUTURE) ×1 IMPLANT
SUT VIC AB 1 CT1 27 (SUTURE) ×3
SUT VIC AB 1 CT1 27XBRD ANBCTR (SUTURE) ×1 IMPLANT
SUT VIC AB 2-0 CT1 27 (SUTURE) ×3
SUT VIC AB 2-0 CT1 TAPERPNT 27 (SUTURE) ×1 IMPLANT
TOWEL OR 17X24 6PK STRL BLUE (TOWEL DISPOSABLE) ×3 IMPLANT
TOWEL OR 17X26 10 PK STRL BLUE (TOWEL DISPOSABLE) ×3 IMPLANT
TRAY FOLEY CATH 16FRSI W/METER (SET/KITS/TRAYS/PACK) ×2 IMPLANT
WATER STERILE IRR 1000ML POUR (IV SOLUTION) ×2 IMPLANT

## 2016-01-20 NOTE — Progress Notes (Signed)
Pt arrived to unit with 10/10 pain. RN gave PRN IV  pain medication to help with immediate pain. Checked on pt after given medication and he was sleeping peacefully. When nurse aroused pt he stated that it was a 10/10 and restless, even though pt was just sleeping. RN informed pt that he was still very drowsy and did not want to over sedate patient.

## 2016-01-20 NOTE — Progress Notes (Signed)
Pt currently sleeping at this time.

## 2016-01-20 NOTE — Anesthesia Procedure Notes (Signed)
Procedure Name: Intubation Date/Time: 01/20/2016 1:54 PM Performed by: Coralee Rud Pre-anesthesia Checklist: Patient identified, Emergency Drugs available, Suction available and Patient being monitored Patient Re-evaluated:Patient Re-evaluated prior to inductionOxygen Delivery Method: Circle system utilized Preoxygenation: Pre-oxygenation with 100% oxygen Intubation Type: IV induction Ventilation: Mask ventilation without difficulty Laryngoscope Size: Miller and 3 Grade View: Grade II Tube type: Oral Tube size: 7.5 mm Number of attempts: 1 Airway Equipment and Method: Stylet Placement Confirmation: positive ETCO2,  ETT inserted through vocal cords under direct vision and breath sounds checked- equal and bilateral Secured at: 23 cm Tube secured with: Tape

## 2016-01-20 NOTE — Transfer of Care (Signed)
Immediate Anesthesia Transfer of Care Note  Patient: Sean Mccoy  Procedure(s) Performed: Procedure(s): RIGHT TOTAL HIP ARTHROPLASTY ANTERIOR APPROACH (Right)  Patient Location: PACU  Anesthesia Type:General  Level of Consciousness: awake, alert  and patient cooperative  Airway & Oxygen Therapy: Patient Spontanous Breathing and Patient connected to nasal cannula oxygen  Post-op Assessment: Report given to RN, Post -op Vital signs reviewed and stable, Patient moving all extremities and Patient moving all extremities X 4  Post vital signs: Reviewed and stable  Last Vitals:  Filed Vitals:   01/20/16 1239  BP: 144/81  Pulse: 98  Temp: 36.4 C  Resp: 18    Complications: No apparent anesthesia complications

## 2016-01-20 NOTE — Anesthesia Preprocedure Evaluation (Signed)
Anesthesia Evaluation  Patient identified by MRN, date of birth, ID band Patient awake    Reviewed: Allergy & Precautions, NPO status , Patient's Chart, lab work & pertinent test results  History of Anesthesia Complications Negative for: history of anesthetic complications  Airway Mallampati: I  TM Distance: >3 FB Neck ROM: Full    Dental  (+) Teeth Intact   Pulmonary neg pulmonary ROS,    breath sounds clear to auscultation       Cardiovascular hypertension,  Rhythm:Regular Rate:Normal     Neuro/Psych Anxiety Depression negative neurological ROS     GI/Hepatic negative GI ROS, Neg liver ROS,   Endo/Other  negative endocrine ROS  Renal/GU negative Renal ROS     Musculoskeletal  (+) Arthritis ,   Abdominal   Peds  Hematology   Anesthesia Other Findings   Reproductive/Obstetrics                             Anesthesia Physical Anesthesia Plan  ASA: II  Anesthesia Plan: General   Post-op Pain Management:    Induction: Intravenous  Airway Management Planned: Oral ETT  Additional Equipment:   Intra-op Plan:   Post-operative Plan: Extubation in OR  Informed Consent: I have reviewed the patients History and Physical, chart, labs and discussed the procedure including the risks, benefits and alternatives for the proposed anesthesia with the patient or authorized representative who has indicated his/her understanding and acceptance.   Dental advisory given  Plan Discussed with: CRNA and Surgeon  Anesthesia Plan Comments:         Anesthesia Quick Evaluation

## 2016-01-20 NOTE — Brief Op Note (Signed)
01/20/2016  3:06 PM  PATIENT:  Sean Mccoy  55 y.o. male  PRE-OPERATIVE DIAGNOSIS:  avascular necrosis right hip  POST-OPERATIVE DIAGNOSIS:  avascular necrosis right hip  PROCEDURE:  Procedure(s): RIGHT TOTAL HIP ARTHROPLASTY ANTERIOR APPROACH (Right)  SURGEON:  Surgeon(s) and Role:    * Kathryne Hitch, MD - Primary  PHYSICIAN ASSISTANT: Rexene Edison, PA-C  ANESTHESIA:   general  EBL:  Total I/O In: -  Out: 800 [Urine:500; Blood:300]  BLOOD ADMINISTERED:none  COUNTS:  YES  TOURNIQUET:  * No tourniquets in log *  DICTATION: .Other Dictation: Dictation Number [id number]  PLAN OF CARE: Admit to inpatient   PATIENT DISPOSITION:  PACU - hemodynamically stable.   Delay start of Pharmacological VTE agent (>24hrs) due to surgical blood loss or risk of bleeding: no

## 2016-01-20 NOTE — H&P (Signed)
TOTAL HIP ADMISSION H&P  Patient is admitted for right total hip arthroplasty.  Subjective:  Chief Complaint: right hip pain  HPI: Sean Mccoy, 55 y.o. male, has a history of pain and functional disability in the right hip(s) due to avascular necrosis and patient has failed non-surgical conservative treatments for greater than 12 weeks to include NSAID's and/or analgesics, corticosteriod injections, use of assistive devices and activity modification.  Onset of symptoms was gradual starting 3 years ago with gradually worsening course since that time.The patient noted no past surgery on the right hip(s).  Patient currently rates pain in the right hip at 10 out of 10 with activity. Patient has night pain, worsening of pain with activity and weight bearing, trendelenberg gait, pain that interfers with activities of daily living and pain with passive range of motion. Patient has evidence of subchondral cysts, subchondral sclerosis and periarticular osteophytes by imaging studies. This condition presents safety issues increasing the risk of falls.  There is no current active infection.  Patient Active Problem List   Diagnosis Date Noted  . Avascular necrosis of bone of right hip (HCC) 01/20/2016  . Depression 11/29/2012  . Anxiety 11/29/2012  . Post traumatic stress disorder 11/29/2012  . Substance induced mood disorder (HCC) 11/29/2012  . Alcohol abuse 11/29/2012   Past Medical History  Diagnosis Date  . Hypertension   . Arthritis   . Knee fracture   . Groin injury   . Ankle injury   . Avascular necrosis (HCC)   . Anxiety   . Depression   . PTSD (post-traumatic stress disorder)     Past Surgical History  Procedure Laterality Date  . Knee surgery      Prescriptions prior to admission  Medication Sig Dispense Refill Last Dose  . oxyCODONE-acetaminophen (PERCOCET/ROXICET) 5-325 MG tablet Take 1 tablet by mouth every 4 (four) hours as needed for severe pain. 6 tablet 0 01/20/2016 at 1130   . ibuprofen (ADVIL,MOTRIN) 800 MG tablet Take 1 tablet (800 mg total) by mouth 3 (three) times daily. 21 tablet 0 Unknown at Unknown time   No Known Allergies  Social History  Substance Use Topics  . Smoking status: Never Smoker   . Smokeless tobacco: Never Used  . Alcohol Use: 1.2 oz/week    2 Cans of beer per week     Comment: occ    Family History  Problem Relation Age of Onset  . Diabetes Mother   . Hypertension Mother   . Diabetes Father   . Hypertension Father      Review of Systems  Musculoskeletal: Positive for joint pain.  All other systems reviewed and are negative.   Objective:  Physical Exam  Constitutional: He is oriented to person, place, and time. He appears well-developed and well-nourished.  HENT:  Head: Normocephalic and atraumatic.  Eyes: EOM are normal. Pupils are equal, round, and reactive to light.  Neck: Normal range of motion. Neck supple.  Cardiovascular: Normal rate and regular rhythm.   GI: Soft. Bowel sounds are normal.  Musculoskeletal:       Right hip: He exhibits decreased range of motion, decreased strength, tenderness, bony tenderness and deformity.  Neurological: He is alert and oriented to person, place, and time.  Skin: Skin is warm and dry.  Psychiatric: He has a normal mood and affect.    Vital signs in last 24 hours: Temp:  [97.5 F (36.4 C)] 97.5 F (36.4 C) (01/24 1239) Pulse Rate:  [98] 98 (01/24 1239) Resp:  [  18] 18 (01/24 1239) BP: (144)/(81) 144/81 mmHg (01/24 1239) SpO2:  [95 %] 95 % (01/24 1239)  Labs:   Estimated body mass index is 22.96 kg/(m^2) as calculated from the following:   Height as of 01/13/16: 5\' 10"  (1.778 m).   Weight as of 12/19/15: 72.576 kg (160 lb).   Imaging Review Plain radiographs demonstrate severe avascular necrosis of the right hip(s). The bone quality appears to be good for age and reported activity level.  Assessment/Plan:  Avascular necrosis, right hip(s)  The patient history,  physical examination, clinical judgement of the provider and imaging studies are consistent with end stage AVN of the right hip(s) and total hip arthroplasty is deemed medically necessary. The treatment options including medical management, injection therapy, arthroscopy and arthroplasty were discussed at length. The risks and benefits of total hip arthroplasty were presented and reviewed. The risks due to aseptic loosening, infection, stiffness, dislocation/subluxation,  thromboembolic complications and other imponderables were discussed.  The patient acknowledged the explanation, agreed to proceed with the plan and consent was signed. Patient is being admitted for inpatient treatment for surgery, pain control, PT, OT, prophylactic antibiotics, VTE prophylaxis, progressive ambulation and ADL's and discharge planning.The patient is planning to be discharged home with home health services

## 2016-01-20 NOTE — Op Note (Signed)
[name], [name]                ACCOUNT NO.:  [id number]  MEDICAL RECORD NO.:  [id number]  LOCATION:  [id number]                        FACILITY:  [facility]  PHYSICIAN:  Vanita Panda. Magnus Ivan, M.D.DATE OF BIRTH:  1961/12/10  DATE OF PROCEDURE:  01/20/2016 DATE OF DISCHARGE:                              OPERATIVE REPORT   PREOPERATIVE DIAGNOSIS:  Severe end-stage avascular necrosis, right hip.  POSTOPERATIVE DIAGNOSIS:  Severe end-stage avascular necrosis, right hip.  PROCEDURE:  Right total hip arthroplasty through direct anterior approach.  IMPLANTS:  DePuy Sector Gription acetabular component size 52, size 36+ 4 polyethylene liner, size 14 Corail femoral component with standard offset, size 36+ 1.5 ceramic hip ball.  SURGEON:  Vanita Panda. Magnus Ivan, M.D.  ASSISTANT:  Richardean Canal, PA-C.  ANESTHESIA:  General.  BLOOD LOSS:  300 mL.  ANTIBIOTICS:  2 g IV Ancef.  COMPLICATIONS:  None.  INDICATIONS:  Sean Mccoy is a 55 year old gentleman well known to me. He has severe pain from his right hip with known avascular necrosis and femoral head collapse.  I had  been seeing for over a year now.  He has been in and out the emergency room requesting narcotic pain medicines in our office.  I have recommended a total hip replacement on numerous occasions, he finally got to the point where I could not give him any more pain medications and he kept going in and out the ER.  I talked to him in detail and said that his life to be changed completely if he ended up having a hip replacement surgery.  He finally agrees to this and is presenting for this definitive surgery.  He understands the risk of acute blood loss anemia, nerve and vessel injury, fracture, infection, dislocation, DVT.  He understands our goals are decreased pain, improved mobility, and overall improved quality of life.  His leg is already shorter on the right side due to the femoral head collapse, this was also seen on  x-ray and clinical exam, and he is the appropriate person to proceed with surgery.  He understands our goals again are decreased pain, improved mobility, and overall improved quality of life.  PROCEDURE DESCRIPTION:  After informed consent obtained, appropriate right hip was marked.  He was brought to the operating room, where general anesthesia was obtained while he was on the stretcher.  An in- and-out catheter was then performed and traction boots were placed on both his feet.  Next, he was placed supine on the Hana fracture table with the perineal post in place and both legs in inline with skeletal traction devices, but no traction applied.  His right operative hip was then prepped and draped with DuraPrep and sterile drapes.  Time-out was called.  He was identified as correct patient, correct right hip.  We then made an incision inferior and posterior to the anterior superior iliac spine and carried this obliquely down the leg.  We dissected down the tensor fascia lata muscle.  Tensor fascia was then divided longitudinally, so I could proceed with a direct anterior approach to the hip.  We identified and cauterized the lateral femoral circumflex vessels and then identified the hip capsule.  I opened  up the hip capsule in L-type format finding a large joint effusion.  We cauterized the lateral femoral circumflex vessels as well.  We then placed Cobra retractors around lateral and medial femoral neck and made our femoral neck cut with an oscillating saw just proximal to the lesser trochanter and completed this on osteotome.  I placed a corkscrew guide in the femoral head and removed the femoral head in its entirety.  We found it to be devoid of cartilage, it was collapsed and soft cartilage indicative of avascular necrosis.  We then cleaned the acetabular remnants of acetabular labrum and debris.  We then began reaming in 2 mm increments from a size 42 up to a size 52 with all reamers  under direct visualization and the last reamer under direct fluoroscopy, so we could see our depth of reaming, our inclination, and anteversion.  Our last reamer was actually 51.  I then placed the real 52 DePuy Sector Gription acetabular component without difficulty, this was again under fluoroscopy and visualization as well.  We then placed the real 36+ 4 neutral polyethylene liner.  Attention was then turned to the femur. With the leg externally rotated to 100 degrees extended and adducted, we were able to place a Mueller retractor medially and a Hohmann retractor behind the greater trochanter.  I released the lateral joint capsule and used a box cutting osteotome to enter femoral canal and a rongeur to lateralize.  We then began broaching from a size 8 broach using the Corail broaching system up to a size 14.  With a size 14 placed, we trialed a standard neck and a 36+ 1.5 trial hip ball.  We brought the leg back over and up with traction and internal rotation reducing the pelvis and it was stable.  We were pleased with leg length and offset and range of motion under fluoroscopy and as well.  We then dislocated the hip and removed the trial components.  We then placed the real Corail femoral component with standard offset, size 14 and the real 36+ 1.5 ceramic hip ball, reduced this in acetabulum, it was stable.  We then copiously irrigated the soft tissue with normal saline solution using pulsatile lavage.  We were able to close the joint capsule with interrupted #1 Ethibond suture, followed by running #1 Vicryl in the tensor fascia, 0 Vicryl in the deep tissue, 2-0 Vicryl in the subcutaneous tissue, 4-0 Monocryl subcuticular stitch, and Steri-Strips on the skin.  An Aquacel dressing was applied.  He was then taken off the Hana table, awakened, extubated, and taken to the recovery room in stable condition.  All final counts were correct.  There were no complications  noted.     Vanita Panda. Magnus Ivan, M.D.     CYB/MEDQ  D:  01/20/2016  T:  01/20/2016  Job:  [id number]

## 2016-01-21 ENCOUNTER — Encounter (HOSPITAL_COMMUNITY): Payer: Self-pay | Admitting: Orthopaedic Surgery

## 2016-01-21 LAB — CBC
HEMATOCRIT: 27.7 % — AB (ref 39.0–52.0)
Hemoglobin: 9.3 g/dL — ABNORMAL LOW (ref 13.0–17.0)
MCH: 32.3 pg (ref 26.0–34.0)
MCHC: 33.6 g/dL (ref 30.0–36.0)
MCV: 96.2 fL (ref 78.0–100.0)
PLATELETS: 176 10*3/uL (ref 150–400)
RBC: 2.88 MIL/uL — ABNORMAL LOW (ref 4.22–5.81)
RDW: 14.3 % (ref 11.5–15.5)
WBC: 6 10*3/uL (ref 4.0–10.5)

## 2016-01-21 LAB — BASIC METABOLIC PANEL
ANION GAP: 16 — AB (ref 5–15)
BUN: 5 mg/dL — ABNORMAL LOW (ref 6–20)
CALCIUM: 8.4 mg/dL — AB (ref 8.9–10.3)
CO2: 25 mmol/L (ref 22–32)
Chloride: 93 mmol/L — ABNORMAL LOW (ref 101–111)
Creatinine, Ser: 0.76 mg/dL (ref 0.61–1.24)
Glucose, Bld: 149 mg/dL — ABNORMAL HIGH (ref 65–99)
Potassium: 4.2 mmol/L (ref 3.5–5.1)
Sodium: 134 mmol/L — ABNORMAL LOW (ref 135–145)

## 2016-01-21 NOTE — Evaluation (Signed)
Physical Therapy Evaluation Patient Details Name: Sean Mccoy MRN: [id number] DOB: 18-Jun-1961 Today's Date: 01/21/2016   History of Present Illness  Pt is a 55 y.o. male admitted on 01/20/16 for R THA with a direct anterior approach. Pt has other significnat PMH of HTN, arthritis, anxiety, and PTSD.  Clinical Impression  Pt is WBAT on RLE and requires +1 min assist in transfers. Pt was extremely painful and reported pain as 10/10. Pt completed THA exercises in bed and walked 4 feet to recliner. Pt's greatest limiting factor is pain, but he seems to work hard. Hopefully Pt will have reduced pain during next tx and will continue with ambulation and stairs.      Follow Up Recommendations SNF    Equipment Recommendations  Rolling walker with 5" wheels;3in1 (PT)    Recommendations for Other Services       Precautions / Restrictions Precautions Precautions: Anterior Hip Precaution Comments: discussed with Pt that he is WBAT on RLE Restrictions Weight Bearing Restrictions: Yes RLE Weight Bearing: Weight bearing as tolerated      Mobility  Bed Mobility Overal bed mobility: Needs Assistance Bed Mobility: Supine to Sit     Supine to sit: Min assist;HOB elevated     General bed mobility comments: Pt requires help to move RLE to EOB. Pt uses elevated HOB and railings to pull up.  Transfers Overall transfer level: Needs assistance Equipment used: Rolling walker (2 wheeled) Transfers: Sit to/from Stand Sit to Stand: Min assist         General transfer comment: Pt requires heavy min assist to go from sit to stand. Pt leans to right side of walker and grabs both hands on right side of RW. Pt requires verbal cues to stand straight and for hand placement.  Ambulation/Gait Ambulation/Gait assistance: Min assist;+2 safety/equipment Ambulation Distance (Feet): 4 Feet Assistive device: Rolling walker (2 wheeled) Gait Pattern/deviations: Step-to pattern;Antalgic;Decreased weight shift  to right Gait velocity: decreased Gait velocity interpretation: Below normal speed for age/gender General Gait Details: Pt requires verbal cues for foot placement and reinforcement that he can put weight through RLE as he can tolerate. Pt takes very small nearly shuffled steps when turning.                     Balance Overall balance assessment: Needs assistance Sitting-balance support: Feet supported;Single extremity supported Sitting balance-Leahy Scale: Fair     Standing balance support: Bilateral upper extremity supported Standing balance-Leahy Scale: Fair Standing balance comment: Pt requires min assist to maintain standing balance when any weight is being put through RLE.                             Pertinent Vitals/Pain Pain Assessment: 0-10 Pain Score: 10-Worst pain ever Pain Location: right hip  Pain Descriptors / Indicators: Aching Pain Intervention(s): Limited activity within patient's tolerance;Monitored during session;Repositioned;RN gave pain meds during session    Home Living Family/patient expects to be discharged to:: Group home (Pt reported it was a dormatory/group home ) Living Arrangements: Alone Available Help at Discharge:  (Pt is unsure about any available help) Type of Home: Group Home Home Access: Stairs to enter Entrance Stairs-Rails: Left Entrance Stairs-Number of Steps: 8 Home Layout: One level Home Equipment: Crutches;Grab bars - tub/shower (reports using crutch on left side) Additional Comments: Pt did not give details about living arrangements. He started to describe the set up as a group home, then later called  it a dormatory.    Prior Function Level of Independence: Independent with assistive device(s) (reports using crutch on left side for approx 6 months)         Comments: Pt reports wanting to be able to get back to walking and working.     Hand Dominance   Dominant Hand: Right       Communication    Communication: No difficulties  Cognition Arousal/Alertness: Awake/alert Behavior During Therapy: WFL for tasks assessed/performed Overall Cognitive Status: Within Functional Limits for tasks assessed                      General Comments General comments (skin integrity, edema, etc.): Pt reported being very painful at beginning of session. Pt's reported pain was 10/10, which was greatly limiting mobility.    Exercises Total Joint Exercises Ankle Circles/Pumps: AROM;20 reps;Both Quad Sets: AAROM;Right;10 reps Heel Slides: AAROM;Right;10 reps Hip ABduction/ADduction: AAROM;Right;10 reps      Assessment/Plan    PT Assessment Patient needs continued PT services  PT Diagnosis Difficulty walking;Acute pain   PT Problem List Decreased strength;Decreased range of motion;Decreased activity tolerance;Decreased balance;Decreased mobility;Decreased knowledge of use of DME;Pain  PT Treatment Interventions DME instruction;Stair training;Gait training;Functional mobility training;Therapeutic activities;Therapeutic exercise;Balance training;Patient/family education   PT Goals (Current goals can be found in the Care Plan section) Acute Rehab PT Goals Patient Stated Goal: Decrease pain  PT Goal Formulation: With patient Time For Goal Achievement: 02/04/16 Potential to Achieve Goals: Good    Frequency 7X/week   Barriers to discharge Inaccessible home environment;Decreased caregiver support         End of Session Equipment Utilized During Treatment: Gait belt Activity Tolerance: Patient limited by pain Patient left: in chair;with call bell/phone within reach Nurse Communication: Mobility status         Time: 1505-6979 PT Time Calculation (min) (ACUTE ONLY): 44 min   Charges:  1 mod evaluation, 1 gait, 1 therapeutic exercise             [place], [place] [phone] office Chase Picket 01/21/2016, 12:18 PM

## 2016-01-21 NOTE — Clinical Social Work Note (Signed)
Clinical Social Work Assessment  Patient Details  Name: Sean Mccoy MRN: [id number] Date of Birth: 06/08/1961  Date of referral:  01/21/16               Reason for consult:  Facility Placement                Permission sought to share information with:  Case Manager, Magazine features editor Permission granted to share information::  Yes, Verbal Permission Granted  Name::     Sean Mccoy [phone]  Agency::  [facility]  Relationship::     Contact Information:     Housing/Transportation Living arrangements for the past 2 months:  Apartment Source of Information:  Patient Patient Interpreter Needed:  None Criminal Activity/Legal Involvement Pertinent to Current Situation/Hospitalization:  No - Comment as needed Significant Relationships:  None Lives with:  Self Do you feel safe going back to the place where you live?  Yes Need for family participation in patient care:  No (Coment)  Care giving concerns:     Office manager / plan:  Per pt, living in a apartment/dormitory/board house prior to admission and may not be able to return due to unpaid rent. Pt reports unable to work due to functional status. SW discussed PT current recommendation for SNF and placement process for SNF with Medicaid only. Pt refusing SNF at this time and reports he plans to d/c to [facility]) a transitional substance abuse housing program. SW spoke with contact for [facility], Sean Mccoy [phone], who reports they are able to accept pt under Medicaid benefits provided pt is able to ambulate household distances. [facility] is able to accommodate pt with walker and other DME as needed. SW will follow.  Employment status:  Disabled (Comment on whether or not currently receiving Disability) Insurance information:  [place] PT Recommendations:  Skilled Nursing Facility Information / Referral to community resources:   [facility])  Patient/Family's Response to care:  Pt reports  agreeable to current d/c plan.   Patient/Family's Understanding of and Emotional Response to Diagnosis, Current Treatment, and Prognosis:    Emotional Assessment Appearance:  Appears stated age Attitude/Demeanor/Rapport:    Affect (typically observed):  Accepting, Adaptable, Appropriate, Hopeful Orientation:  Oriented to Self, Oriented to Place, Oriented to  Time, Oriented to Situation Alcohol / Substance use:  Alcohol Use Psych involvement (Current and /or in the community):  No (Comment)  Discharge Needs  Concerns to be addressed:  Discharge Planning Concerns Readmission within the last 30 days:  No Current discharge risk:    Barriers to Discharge:  No Barriers Identified   Deatra Robinson, LCSW 01/21/2016, 1:25 PM

## 2016-01-21 NOTE — Progress Notes (Signed)
Physical Therapy Treatment Patient Details Name: Sean Mccoy MRN: [id number] DOB: [birth year] Today's Date: 01/21/2016    History of Present Illness Pt is a 55 y.o. male admitted on 01/20/16 for R THA with a direct anterior approach. Pt has other significnat PMH of HTN, arthritis, anxiety, and PTSD.    PT Comments    Pt reports his pain is less than it was this morning, and was willing to participate. Pt ambulated with a RW and +1 min assist for approx 100 feet with the recliner following behind. Pt reported his pain did not greatly increase with ambulation, but it felt "less stiff" as he moved, which allowed him to better move. Pt completed THA exercises after walking.   Follow Up Recommendations  SNF     Equipment Recommendations  Rolling walker with 5" wheels;3in1 (PT)       Precautions / Restrictions Precautions Precautions: Anterior Hip Precaution Comments: Reinforced with Pt that he is WBAT on RLE  Restrictions Weight Bearing Restrictions: Yes RLE Weight Bearing: Weight bearing as tolerated    Mobility  Bed Mobility Overal bed mobility: Needs Assistance Bed Mobility: Supine to Sit     Supine to sit: Min assist;HOB elevated     General bed mobility comments: Pt requires help to move RLE to EOB. Pt uses elevated HOB and railings to pull up.  Transfers Overall transfer level: Needs assistance Equipment used: Rolling walker (2 wheeled) Transfers: Sit to/from Stand Sit to Stand: Min assist         General transfer comment: Pt requires min assist to move from sitting EOB to stand. Pt maintains a very flexed trunk, and requires verbal cues to stand straight to help maintain his balance.   Ambulation/Gait Ambulation/Gait assistance: +2 safety/equipment;Min assist Ambulation Distance (Feet): 100 Feet Assistive device: Rolling walker (2 wheeled) Gait Pattern/deviations: Step-through pattern;Antalgic;Decreased weight shift to right Gait velocity: decreased Gait  velocity interpretation: Below normal speed for age/gender General Gait Details: Pt requires verba cues to maintain upright posture and to look forward. Pt transistioned to step-through gait on his own. This morning he utilized a step-to gait pattern, but his pain is reduced this afternoon which is allowing a more normal gait pattern, Pt reported he felt his RLE was "less stiff" once moving, which helped him to walk with more ease.                    Balance Overall balance assessment: Needs assistance Sitting-balance support: No upper extremity supported;Feet supported Sitting balance-Leahy Scale: Good Sitting balance - Comments: Pt was less guarded and painful this afternoon allowing him to maintain sitting EOB balance without any UE support.   Standing balance support: Bilateral upper extremity supported Standing balance-Leahy Scale: Fair Standing balance comment: Pt requires min assist to maintain standing balance when any weight is being put through RLE.                    Cognition Arousal/Alertness: Awake/alert Behavior During Therapy: WFL for tasks assessed/performed Overall Cognitive Status: Within Functional Limits for tasks assessed                      Exercises Total Joint Exercises Ankle Circles/Pumps: AROM;20 reps;Both Quad Sets: AAROM;Right;10 reps Heel Slides: AAROM;Right;10 reps Hip ABduction/ADduction: AAROM;Right;10 reps    General Comments General comments (skin integrity, edema, etc.): Pt reported being very painful at beginning of session. Pt's reported pain was 10/10, which was greatly limiting mobility.  Pertinent Vitals/Pain Pain Assessment: 0-10 Pain Score: 7  Pain Location: right hip  Pain Descriptors / Indicators: Aching Pain Intervention(s): Limited activity within patient's tolerance;Monitored during session;Repositioned           PT Goals (current goals can now be found in the care plan section) Acute Rehab PT  Goals Patient Stated Goal: Decrease pain  PT Goal Formulation: With patient Time For Goal Achievement: 02/04/16 Potential to Achieve Goals: Good Progress towards PT goals: Progressing toward goals    Frequency  7X/week    PT Plan Current plan remains appropriate       End of Session Equipment Utilized During Treatment: Gait belt Activity Tolerance: Patient limited by pain Patient left: in chair;with call bell/phone within reach     Time: 1359-1425 PT Time Calculation (min) (ACUTE ONLY): 26 min  Charges:  1 gait, 1 therapeutic exercise                      [place], [place] [phone] office Chase Picket 01/21/2016, 2:38 PM

## 2016-01-21 NOTE — Progress Notes (Signed)
Subjective: 1 Day Post-Op Procedure(s) (LRB): RIGHT TOTAL HIP ARTHROPLASTY ANTERIOR APPROACH (Right) Patient reports pain as moderate.   Awake and alert no complaints. Objective: Vital signs in last 24 hours: Temp:  [97.5 F (36.4 C)-98.5 F (36.9 C)] 98.5 F (36.9 C) (01/25 0548) Pulse Rate:  [80-98] 95 (01/25 0548) Resp:  [6-28] 20 (01/25 0548) BP: (132-156)/(63-95) 132/63 mmHg (01/25 0548) SpO2:  [95 %-100 %] 100 % (01/25 0548)  Intake/Output from previous day: 01/24 0701 - 01/25 0700 In: 1792.5 [P.O.:240; I.V.:1452.5; IV Piggyback:100] Out: 1650 [Urine:1350; Blood:300] Intake/Output this shift: Total I/O In: -  Out: 300 [Urine:300]   Recent Labs  01/21/16 0530  HGB 9.3*    Recent Labs  01/21/16 0530  WBC 6.0  RBC 2.88*  HCT 27.7*  PLT 176    Recent Labs  01/21/16 0530  NA 134*  K 4.2  CL 93*  CO2 25  BUN 5*  CREATININE 0.76  GLUCOSE 149*  CALCIUM 8.4*   No results for input(s): LABPT, INR in the last 72 hours.  Sensation intact distally Intact pulses distally Dorsiflexion/Plantar flexion intact Incision: scant drainage Compartment soft  Assessment/Plan: 1 Day Post-Op Procedure(s) (LRB): RIGHT TOTAL HIP ARTHROPLASTY ANTERIOR APPROACH (Right) Up with therapy  Monitor for symptoms of  Acute blood loss anemia.  Richardean Canal 01/21/2016, 8:51 AM

## 2016-01-22 LAB — CBC
HEMATOCRIT: 28.8 % — AB (ref 39.0–52.0)
HEMOGLOBIN: 9.2 g/dL — AB (ref 13.0–17.0)
MCH: 31.1 pg (ref 26.0–34.0)
MCHC: 31.9 g/dL (ref 30.0–36.0)
MCV: 97.3 fL (ref 78.0–100.0)
Platelets: 188 10*3/uL (ref 150–400)
RBC: 2.96 MIL/uL — AB (ref 4.22–5.81)
RDW: 14.3 % (ref 11.5–15.5)
WBC: 7.7 10*3/uL (ref 4.0–10.5)

## 2016-01-22 NOTE — Care Management Note (Signed)
Case Management Note  Patient Details  Name: Sean Mccoy MRN: [id number] Date of Birth: [birth year]  Subjective/Objective:       S/p right total hip arthroplasty             Action/Plan: Set up with [facility] for HHPT by MD office. PT recommended SNF. Referral made to CSW, patient initially refused SNF. Spoke with patient, he is concerned that he may have to go to SNF due to very limited assistance at home, now agreeable to considering SNF, notified CSW. Rainie Crenshaw with [facility] met with patient and she is aware that patient hopes to go to [facility] and have HHPT there vs SNF if he progresses with PT. Will continue to follow to facilitate discharge.    Expected Discharge Date:                  Expected Discharge Plan:  Skilled Nursing Facility  In-House Referral:  Clinical Social Work  Discharge planning Services  CM Consult  Post Acute Care Choice:    Choice offered to:     DME Arranged:    DME Agency:     HH Arranged:    [place] Agency:     Status of Service:  In process, will continue to follow  Medicare Important Message Given:    Date Medicare IM Given:    Medicare IM give by:    Date Additional Medicare IM Given:    Additional Medicare Important Message give by:     If discussed at [place] of Stay Meetings, dates discussed:    Additional Comments:  Nila Nephew, RN 01/22/2016, 12:23 PM

## 2016-01-22 NOTE — Progress Notes (Signed)
Occupational Therapy Evaluation Patient Details Name: Sean Mccoy MRN: [id number] DOB: [birth year] Today's Date: 01/22/2016    History of Present Illness Pt is a 55 y.o. male admitted on 01/20/16 for R THA with a direct anterior approach. Pt has other significnat PMH of HTN, arthritis, anxiety, and PTSD.   Clinical Impression   PTA, pt was independent with ADLs and used 1 crutch on L side for mobility. Pt currently required min guard assist for functional transfers attempted this session and min assist for LB ADLs. Pt plans to d/c to SNF for post-acute rehab stay before discharging to a transitional living home. Pt will benefit from continued acute OT to increase his independence and safety with ADLs and mobility. Recommend SNF. Will continue to follow acutely.    Follow Up Recommendations  SNF    Equipment Recommendations  Other (comment) (TBD in next venue)    Recommendations for Other Services       Precautions / Restrictions Precautions Precautions: Anterior Hip Restrictions Weight Bearing Restrictions: Yes RLE Weight Bearing: Weight bearing as tolerated      Mobility Bed Mobility Overal bed mobility:  (Pt received sitting EOB and returned to recliner)             General bed mobility comments: Pt up in chair on OT arrival  Transfers Overall transfer level: Needs assistance Equipment used: Rolling walker (2 wheeled) Transfers: Sit to/from Stand Sit to Stand: Min guard         General transfer comment: Min guard assist for safety. Verbal cues x2 for safe hand placement on seated surfaces.     Balance Overall balance assessment: Needs assistance Sitting-balance support: No upper extremity supported;Feet supported Sitting balance-Leahy Scale: Good     Standing balance support: Bilateral upper extremity supported;During functional activity Standing balance-Leahy Scale: Fair Standing balance comment: Able to maintain balance at sink to complete grooming  tasks without UE support for ~4 minutes                            ADL Overall ADL's : Needs assistance/impaired     Grooming: Wash/dry hands;Min guard;Standing   Upper Body Bathing: Set up;Sitting   Lower Body Bathing: Minimal assistance;Cueing for compensatory techniques;Sit to/from stand Lower Body Bathing Details (indicate cue type and reason): Unable to cross ankle-over-knee without severe pain Upper Body Dressing : Set up;Sitting   Lower Body Dressing: Minimal assistance;Cueing for compensatory techniques;Sit to/from stand Lower Body Dressing Details (indicate cue type and reason): Unable to cross ankle-over-knee without severe pain Toilet Transfer: Min guard;Cueing for safety;Ambulation;BSC;RW Toilet Transfer Details (indicate cue type and reason): BSC over toilet, cues to feel BSC on back of legs before sitting Toileting- Clothing Manipulation and Hygiene: Min guard;Sit to/from stand       Functional mobility during ADLs: Min guard;Rolling walker General ADL Comments: Unable to reach LB for ADLs at this time - able to cross ankle-over-knee but causes increased pain     Vision Vision Assessment?: No apparent visual deficits   Perception     Praxis      Pertinent Vitals/Pain Pain Assessment: 0-10 Pain Score: 8  Pain Location: R hip and R knee Pain Descriptors / Indicators: Aching Pain Intervention(s): Limited activity within patient's tolerance;Monitored during session;Repositioned;Heat applied (to knee only)     Hand Dominance Right   Extremity/Trunk Assessment Upper Extremity Assessment Upper Extremity Assessment: Overall WFL for tasks assessed   Lower Extremity Assessment Lower Extremity Assessment: RLE  deficits/detail RLE Deficits / Details: decreased strength and AROM as expected post op   Cervical / Trunk Assessment Cervical / Trunk Assessment: Normal   Communication Communication Communication: No difficulties   Cognition  Arousal/Alertness: Awake/alert Behavior During Therapy: Anxious Overall Cognitive Status: Within Functional Limits for tasks assessed                     General Comments       Exercises       Shoulder Instructions      Home Living Family/patient expects to be discharged to:: Skilled nursing facility Living Arrangements: Other (Comment) (Transitional Living House) Available Help at Discharge: Family;Available PRN/intermittently (to help move pt's belongings) Type of Home: Other(Comment) (Transitional Living Home)                       Home Equipment: Crutches   Additional Comments: Pt reports he does not know anything about his new Transitional Living Home and what the set up will be like      Prior Functioning/Environment Level of Independence: Independent with assistive device(s)        Comments: Used 1 crutch on L side for past 6 months    OT Diagnosis: Acute pain   OT Problem List: Decreased strength;Decreased range of motion;Decreased activity tolerance;Impaired balance (sitting and/or standing);Decreased coordination;Decreased safety awareness;Decreased knowledge of precautions;Decreased knowledge of use of DME or AE;Pain   OT Treatment/Interventions: Self-care/ADL training;Therapeutic exercise;Energy conservation;DME and/or AE instruction;Therapeutic activities;Patient/family education;Balance training    OT Goals(Current goals can be found in the care plan section) Acute Rehab OT Goals Patient Stated Goal: Decrease pain  OT Goal Formulation: With patient Time For Goal Achievement: 02/05/16 Potential to Achieve Goals: Good ADL Goals Pt Will Perform Grooming: with supervision;standing Pt Will Perform Lower Body Bathing: with supervision;with adaptive equipment;sit to/from stand Pt Will Perform Lower Body Dressing: with supervision;with adaptive equipment;sit to/from stand Pt Will Transfer to Toilet: with supervision;ambulating;bedside commode Pt  Will Perform Toileting - Clothing Manipulation and hygiene: with supervision;sit to/from stand Pt Will Perform Tub/Shower Transfer: Tub transfer;ambulating;3 in 1;rolling walker  OT Frequency: Min 2X/week   Barriers to D/C: Inaccessible home environment;Decreased caregiver support  Pt unsure of accessbility and assistance available at Transitional Living Home - he reports that residents at this facility are required to attend regular meetings and activities and he is unsure if he would be allowed to live there while he requires increased assistance for ADLs and mobility       Co-evaluation              End of Session Equipment Utilized During Treatment: Gait belt;Rolling walker Nurse Communication: Mobility status;Precautions  Activity Tolerance: Patient limited by pain Patient left: in bed;with call bell/phone within reach   Time: 1551-1617 OT Time Calculation (min): 26 min Charges:  OT General Charges $OT Visit: 1 Procedure OT Evaluation $OT Eval Moderate Complexity: 1 Procedure OT Treatments $Self Care/Home Management : 8-22 mins G-Codes:    Nils Pyle, OTR/L Pager: [phone] 01/22/2016, 4:32 PM

## 2016-01-22 NOTE — Progress Notes (Signed)
Subjective: 2 Days Post-Op Procedure(s) (LRB): RIGHT TOTAL HIP ARTHROPLASTY ANTERIOR APPROACH (Right) Patient reports pain as moderate.  Working well with therapy.  Objective: Vital signs in last 24 hours: Temp:  [98.7 F (37.1 C)-99.1 F (37.3 C)] 99.1 F (37.3 C) (01/26 0432) Pulse Rate:  [95-101] 95 (01/26 0432) Resp:  [18] 18 (01/26 0432) BP: (128-140)/(63-70) 128/63 mmHg (01/26 0432) SpO2:  [98 %] 98 % (01/26 0432)  Intake/Output from previous day: 01/25 0701 - 01/26 0700 In: 720 [P.O.:720] Out: 1350 [Urine:1350] Intake/Output this shift:     Recent Labs  01/21/16 0530 01/22/16 0457  HGB 9.3* 9.2*    Recent Labs  01/21/16 0530 01/22/16 0457  WBC 6.0 7.7  RBC 2.88* 2.96*  HCT 27.7* 28.8*  PLT 176 188    Recent Labs  01/21/16 0530  NA 134*  K 4.2  CL 93*  CO2 25  BUN 5*  CREATININE 0.76  GLUCOSE 149*  CALCIUM 8.4*   No results for input(s): LABPT, INR in the last 72 hours.  Dressing with scant drainage. Leg lengths equal.  Tolerates hip motion  Assessment/Plan: 2 Days Post-Op Procedure(s) (LRB): RIGHT TOTAL HIP ARTHROPLASTY ANTERIOR APPROACH (Right) Up with therapy Plan for discharge tomorrow  Kathryne Hitch 01/22/2016, 8:37 AM

## 2016-01-22 NOTE — Progress Notes (Signed)
Physical Therapy Treatment Patient Details Name: Sean Mccoy MRN: [id number] DOB: [birth year] Today's Date: 01/22/2016    History of Present Illness Pt is a 55 y.o. male admitted on 01/20/16 for R THA with a direct anterior approach. Pt has other significnat PMH of HTN, arthritis, anxiety, and PTSD.    PT Comments    Pt reports and looks less painful this morning compared to yesterday morning. Pt completed THA exercises and ambulated approx 100 feet. Initially Pt expressed concern about d/c location and concern about d/c tomorrow. Plan to continue tx with stair training this afternoon.    Follow Up Recommendations  SNF     Equipment Recommendations  Rolling walker with 5" wheels;3in1 (PT)       Precautions / Restrictions Precautions Precautions: Anterior Hip Precaution Comments: Reinforced with Pt that he is WBAT on RLE  Restrictions Weight Bearing Restrictions: Yes RLE Weight Bearing: Weight bearing as tolerated    Mobility  Bed Mobility Overal bed mobility: Needs Assistance Bed Mobility: Supine to Sit     Supine to sit: Min assist;HOB elevated     General bed mobility comments: Pt requires help to move RLE to EOB. Pt uses railings to pull up.  Transfers Overall transfer level: Needs assistance Equipment used: Rolling walker (2 wheeled) Transfers: Sit to/from Stand Sit to Stand: Min assist         General transfer comment: Pt requries min assist to move from sitting EOB to standing with RW. Pt requires verbal cues to push up with hands on EOB prior to reaching for RW.  Ambulation/Gait Ambulation/Gait assistance: Min guard;+2 safety/equipment (recliner rolled behind Pt for safety ) Ambulation Distance (Feet): 100 Feet Assistive device: Rolling walker (2 wheeled) Gait Pattern/deviations: Step-through pattern;Antalgic;Decreased weight shift to right Gait velocity: decreased Gait velocity interpretation: Below normal speed for age/gender General Gait Details: Pt  requires verbal cues to walk within the base of the walker, to put weight as he can tolerate through his RLE, and to walk with his head up. The more the Pt moved, the less stiff his RLE became, and his gait speed increased.                   Balance Overall balance assessment: Needs assistance Sitting-balance support: Feet supported;No upper extremity supported Sitting balance-Leahy Scale: Good     Standing balance support: Bilateral upper extremity supported Standing balance-Leahy Scale: Poor Standing balance comment: Pt requires min guard for safety and bilateral UE support on RW to maintain standing balance.                    Cognition Arousal/Alertness: Awake/alert Behavior During Therapy: WFL for tasks assessed/performed Overall Cognitive Status: Within Functional Limits for tasks assessed                      Exercises Total Joint Exercises Ankle Circles/Pumps: AROM;20 reps;Both Quad Sets: AAROM;Right;10 reps Short Arc QuadBarbaraann Boys;Right;10 reps Heel Slides: AAROM;Right;10 reps Hip ABduction/ADduction: AAROM;Right;10 reps    General Comments General comments (skin integrity, edema, etc.): Pt reported pain in R knee in addition to R hip. Pt says this pain is not new, but has been bothering him more than usual this morning.      Pertinent Vitals/Pain Pain Assessment: 0-10 Pain Score: 8  Pain Location: right hip and into right knee Pain Descriptors / Indicators: Aching Pain Intervention(s): Limited activity within patient's tolerance;Monitored during session;Repositioned  PT Goals (current goals can now be found in the care plan section) Acute Rehab PT Goals Patient Stated Goal: Decrease pain  PT Goal Formulation: With patient Time For Goal Achievement: 02/04/16 Potential to Achieve Goals: Good Progress towards PT goals: Progressing toward goals    Frequency  7X/week    PT Plan Current plan remains appropriate       End of  Session Equipment Utilized During Treatment: Gait belt Activity Tolerance: Patient limited by pain Patient left: in chair;with call bell/phone within reach;with nursing/sitter in room     Time: 1126-1214 PT Time Calculation (min) (ACUTE ONLY): 48 min  Charges:     1 gait, 1 therapeutic exercise, 1 self care                    [place], [place] [phone] office Chase Picket 01/22/2016, 12:35 PM

## 2016-01-22 NOTE — Progress Notes (Signed)
Physical Therapy Treatment Patient Details Name: Sean Mccoy MRN: [id number] DOB: December 29, 1960 Today's Date: 01/22/2016    History of Present Illness Pt is a 55 y.o. male admitted on 01/20/16 for R THA with a direct anterior approach. Pt has other significnat PMH of HTN, arthritis, anxiety, and PTSD.    PT Comments    Pt is unsure if he will need to be able to complete stairs upon d/c, so Pt completed stair training with one rail on the right side. Pt reported feeling more confident after tx should he have to complete stairs upon d/c. Will continue to follow Pt and progress THA exercises.   Follow Up Recommendations  SNF     Equipment Recommendations  Rolling walker with 5" wheels;3in1 (PT)       Precautions / Restrictions Precautions: None  Restrictions Weight Bearing Restrictions: Yes RLE Weight Bearing: Weight bearing as tolerated    Mobility  Bed Mobility Overal bed mobility:  (Pt received sitting EOB and returned to recliner)                Transfers Overall transfer level: Needs assistance Equipment used: Rolling walker (2 wheeled) Transfers: Sit to/from Stand Sit to Stand: Min assist         General transfer comment: Pt requires min assist to move from sitting EOB to standing with RW with verbal cues to stand straight. Pt maintains a very flexed posture when rising from a seated position, which throws his COM forward.  Ambulation/Gait Ambulation/Gait assistance: Min guard;+2 safety/equipment (recliner followed behind Pt for safety ) Ambulation Distance (Feet): 75 Feet Assistive device: Rolling walker (2 wheeled) Gait Pattern/deviations: Step-through pattern;Decreased dorsiflexion - right;Antalgic Gait velocity: decreased Gait velocity interpretation: Below normal speed for age/gender General Gait Details: Pt requires verbal cues to stand straight. Pt required demonstration and explanation of how to dorsiflex right foot in order to create a more normal gait  pattern of heel strike then rolling onto toes to push off. After explanation, Pt was able to implement.   Stairs Stairs: Yes Stairs assistance: Min assist Stair Management: One rail Right Number of Stairs: 8 General stair comments: Requires verbal cues to put weight through your hands on railing and to lead with LLE when ascending and to lead with RLE when descending.         Balance Overall balance assessment: Needs assistance Sitting-balance support: Feet supported;No upper extremity supported Sitting balance-Leahy Scale: Good     Standing balance support: Bilateral upper extremity supported Standing balance-Leahy Scale: Poor Standing balance comment: Pt requires min guad for safety and bilateral UE support on RW in standing.                    Cognition Arousal/Alertness: Awake/alert Behavior During Therapy: WFL for tasks assessed/performed Overall Cognitive Status: Within Functional Limits for tasks assessed                             Pertinent Vitals/Pain Pain Assessment: 0-10 Pain Score: 8  Pain Location: right hip  Pain Descriptors / Indicators: Aching Pain Intervention(s): Limited activity within patient's tolerance;Monitored during session;Repositioned           PT Goals (current goals can now be found in the care plan section) Acute Rehab PT Goals Patient Stated Goal: Decrease pain  PT Goal Formulation: With patient Time For Goal Achievement: 02/04/16 Potential to Achieve Goals: Good Progress towards PT goals: Progressing toward goals  Frequency  7X/week    PT Plan Current plan remains appropriate    Co-evaluation             End of Session Equipment Utilized During Treatment: Gait belt Activity Tolerance: Patient limited by pain Patient left: in chair;with call bell/phone within reach     Time: 1520-1547 PT Time Calculation (min) (ACUTE ONLY): 27 min  Charges:   2 gait                    [place], [place]  [phone] office Chase Picket 01/22/2016, 4:19 PM

## 2016-01-23 MED ORDER — ASPIRIN 325 MG PO TBEC: 325.0000 mg | DELAYED_RELEASE_TABLET | Freq: Two times a day (BID) | ORAL | Status: DC

## 2016-01-23 MED ORDER — METHOCARBAMOL 500 MG PO TABS: 500.0000 mg | ORAL_TABLET | Freq: Four times a day (QID) | ORAL | Status: DC | PRN

## 2016-01-23 MED ORDER — OXYCODONE-ACETAMINOPHEN 5-325 MG PO TABS: 1.0000 | ORAL_TABLET | ORAL | Status: DC | PRN

## 2016-01-23 NOTE — Discharge Instructions (Signed)

## 2016-01-23 NOTE — Discharge Summary (Signed)
Patient ID: Sean Mccoy MRN: [id number] DOB/AGE: 54-12-1960 54 y.o.  Admit date: 01/20/2016 Discharge date: 01/23/2016  Admission Diagnoses:  Principal Problem:   Avascular necrosis of bone of right hip [facility]) Active Problems:   Status post total replacement of right hip   Discharge Diagnoses:  Same  Past Medical History  Diagnosis Date  . Hypertension   . Arthritis   . Knee fracture   . Groin injury   . Ankle injury   . Avascular necrosis (HCC)   . Anxiety   . Depression   . PTSD (post-traumatic stress disorder)     Surgeries: Procedure(s): RIGHT TOTAL HIP ARTHROPLASTY ANTERIOR APPROACH on 01/20/2016   Consultants:    Discharged Condition: Improved  Hospital Course: Linley Penaranda is an 55 y.o. male who was admitted 01/20/2016 for operative treatment ofAvascular necrosis of bone of right hip (HCC). Patient has severe unremitting pain that affects sleep, daily activities, and work/hobbies. After pre-op clearance the patient was taken to the operating room on 01/20/2016 and underwent  Procedure(s): RIGHT TOTAL HIP ARTHROPLASTY ANTERIOR APPROACH.    Patient was given perioperative antibiotics: Anti-infectives    Start     Dose/Rate Route Frequency Ordered Stop   01/20/16 2000  ceFAZolin (ANCEF) IVPB 1 g/50 mL premix     1 g 100 mL/hr over 30 Minutes Intravenous Every 6 hours 01/20/16 1652 01/21/16 0339   01/20/16 1330  ceFAZolin (ANCEF) IVPB 2 g/50 mL premix     2 g 100 mL/hr over 30 Minutes Intravenous To [facility] 01/19/16 1453 01/20/16 1415   01/19/16 1330  ceFAZolin (ANCEF) IVPB 2 g/50 mL premix  Status:  Discontinued     2 g 100 mL/hr over 30 Minutes Intravenous To [facility] 01/19/16 5615 01/19/16 1453       Patient was given sequential compression devices, early ambulation, and chemoprophylaxis to prevent DVT.  Patient benefited maximally from hospital stay and there were no complications.    Recent vital signs: Patient Vitals for the past 24  hrs:  BP Temp Temp src Pulse Resp SpO2  01/23/16 0453 140/72 mmHg 98.3 F (36.8 C) Oral 91 16 100 %  01/22/16 2109 125/69 mmHg 98.7 F (37.1 C) Oral 94 18 100 %  01/22/16 1408 (!) 145/63 mmHg 98.8 F (37.1 C) - 95 18 100 %     Recent laboratory studies:  Recent Labs  01/21/16 0530 01/22/16 0457  WBC 6.0 7.7  HGB 9.3* 9.2*  HCT 27.7* 28.8*  PLT 176 188  NA 134*  --   K 4.2  --   CL 93*  --   CO2 25  --   BUN 5*  --   CREATININE 0.76  --   GLUCOSE 149*  --   CALCIUM 8.4*  --      Discharge Medications:     Medication List    STOP taking these medications        ibuprofen 800 MG tablet  Commonly known as:  ADVIL,MOTRIN      TAKE these medications        aspirin 325 MG EC tablet  Take 1 tablet (325 mg total) by mouth 2 (two) times daily after a meal.     methocarbamol 500 MG tablet  Commonly known as:  ROBAXIN  Take 1 tablet (500 mg total) by mouth every 6 (six) hours as needed for muscle spasms.     oxyCODONE-acetaminophen 5-325 MG tablet  Commonly known as:  PERCOCET/ROXICET  Take 1-2 tablets by  mouth every 4 (four) hours as needed for severe pain.        Diagnostic Studies: Dg Pelvis 1-2 Views  01/09/2016  CLINICAL DATA:  55 year old male with fall and right hip and lower extremity pain. EXAM: RIGHT KNEE - COMPLETE 4+ VIEW; RIGHT FEMUR 2 VIEWS; PELVIS - 1-2 VIEW COMPARISON:  None. FINDINGS: There is sclerotic changes of the right femoral head with collapse and flattening of the right femoral head cortex compatible with avascular necrosis and fracture. No other pelvic acute fracture or dislocation noted. There is diffuse osteopenia of the femur. There is S serpiginous sclerotic lesion involving the distal femoral metadiaphysis compatible with bone infarcts. No acute fracture. There is no dislocation of the knee. There is a focal area of cortical irregularity involving the proximal tibia, likely related to an old healed fracture deformity. No definite acute  fracture identified, however evaluation is limited due to osteopenia. CT may provide better evaluation if there is high clinical suspicion for fracture. IMPRESSION: Avascular necrosis of the right femoral head with cortical collapse. Distal femoral bone infarct. Proximal tibial cortical irregularity, likely chronic. Advanced osteopenia.  No definite acute fracture. Electronically Signed   By: Elgie Collard M.D.   On: 01/09/2016 22:51   Dg Knee Complete 4 Views Right  01/09/2016  CLINICAL DATA:  55 year old male with fall and right hip and lower extremity pain. EXAM: RIGHT KNEE - COMPLETE 4+ VIEW; RIGHT FEMUR 2 VIEWS; PELVIS - 1-2 VIEW COMPARISON:  None. FINDINGS: There is sclerotic changes of the right femoral head with collapse and flattening of the right femoral head cortex compatible with avascular necrosis and fracture. No other pelvic acute fracture or dislocation noted. There is diffuse osteopenia of the femur. There is S serpiginous sclerotic lesion involving the distal femoral metadiaphysis compatible with bone infarcts. No acute fracture. There is no dislocation of the knee. There is a focal area of cortical irregularity involving the proximal tibia, likely related to an old healed fracture deformity. No definite acute fracture identified, however evaluation is limited due to osteopenia. CT may provide better evaluation if there is high clinical suspicion for fracture. IMPRESSION: Avascular necrosis of the right femoral head with cortical collapse. Distal femoral bone infarct. Proximal tibial cortical irregularity, likely chronic. Advanced osteopenia.  No definite acute fracture. Electronically Signed   By: Elgie Collard M.D.   On: 01/09/2016 22:51   Dg Hip Port Unilat With Pelvis 1v Right  01/20/2016  CLINICAL DATA:  Status post right hip replacement EXAM: DG HIP (WITH OR WITHOUT PELVIS) 1V PORT RIGHT COMPARISON:  None. FINDINGS: Right hip replacement is now seen. No fracture or dislocation is  noted. No gross soft tissue abnormality is seen. IMPRESSION: Status post right hip replacement without acute abnormality. Electronically Signed   By: Alcide Clever M.D.   On: 01/20/2016 15:45   Dg Hip Operative Unilat With Pelvis Right  01/20/2016  CLINICAL DATA:  Right anterior total hip arthroplasty EXAM: OPERATIVE RIGHT HIP (WITH PELVIS IF PERFORMED) 2 VIEWS TECHNIQUE: Fluoroscopic spot image(s) were submitted for interpretation post-operatively. COMPARISON:  12/19/2015 FINDINGS: Changes of right hip replacement. Normal AP alignment. No hardware or bony complicating feature. IMPRESSION: Right hip replacement.  No complicating feature. Electronically Signed   By: Charlett Nose M.D.   On: 01/20/2016 15:04   Dg Femur, Min 2 Views Right  01/09/2016  CLINICAL DATA:  55 year old male with fall and right hip and lower extremity pain. EXAM: RIGHT KNEE - COMPLETE 4+ VIEW; RIGHT FEMUR 2  VIEWS; PELVIS - 1-2 VIEW COMPARISON:  None. FINDINGS: There is sclerotic changes of the right femoral head with collapse and flattening of the right femoral head cortex compatible with avascular necrosis and fracture. No other pelvic acute fracture or dislocation noted. There is diffuse osteopenia of the femur. There is S serpiginous sclerotic lesion involving the distal femoral metadiaphysis compatible with bone infarcts. No acute fracture. There is no dislocation of the knee. There is a focal area of cortical irregularity involving the proximal tibia, likely related to an old healed fracture deformity. No definite acute fracture identified, however evaluation is limited due to osteopenia. CT may provide better evaluation if there is high clinical suspicion for fracture. IMPRESSION: Avascular necrosis of the right femoral head with cortical collapse. Distal femoral bone infarct. Proximal tibial cortical irregularity, likely chronic. Advanced osteopenia.  No definite acute fracture. Electronically Signed   By: Elgie Collard M.D.    On: 01/09/2016 22:51    Disposition:  To skilled nursing facility      Discharge Instructions    Discharge patient    Complete by:  As directed            Follow-up Information    Follow up with Kathryne Hitch, MD In 2 weeks.   Specialty:  Orthopedic Surgery   Contact information:   [address] [place] [address] [phone]        Signed: Kathryne Hitch 01/23/2016, 7:13 AM

## 2016-01-23 NOTE — NC FL2 (Signed)
[place] MEDICAID FL2 LEVEL OF CARE SCREENING TOOL     IDENTIFICATION  Patient Name: Sean Mccoy Birthdate: 1961/04/26 Sex: male Admission Date (Current Location): 01/20/2016  [facility] and [place] Number:  Producer, television/film/video and Address:  [facility], [address], [place], [place] [address]      Provider Number: [id number]  Attending Physician Name and Address:  Kathryne Hitch, *  Relative Name and Phone Number:       Current Level of Care: Hospital Recommended Level of Care: Skilled Nursing Facility Prior Approval Number:    Date Approved/Denied:   PASRR Number:    Discharge Plan: SNF    Current Diagnoses: Patient Active Problem List   Diagnosis Date Noted  . Avascular necrosis of bone of right hip (HCC) 01/20/2016  . 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 Depression 11/29/2012  . Anxiety 11/29/2012  . Post traumatic stress disorder 11/29/2012  . Substance induced mood disorder (HCC) 11/29/2012  . Alcohol abuse 11/29/2012    Orientation RESPIRATION BLADDER Height & Weight    Self, Time, Situation, Place    Continent 5\' 10"  (177.8 cm) 166 lbs.  BEHAVIORAL SYMPTOMS/MOOD NEUROLOGICAL BOWEL NUTRITION STATUS      Continent    AMBULATORY STATUS COMMUNICATION OF NEEDS Skin   Limited Assist Verbally Surgical wounds                       Personal Care Assistance Level of Assistance              Functional Limitations Info             SPECIAL CARE FACTORS FREQUENCY  PT (By licensed PT), OT (By licensed OT)                    Contractures Contractures Info: Not present    Additional Factors Info                  Current Medications (01/23/2016):  This is the current hospital active medication list Current Facility-Administered Medications  Medication Dose Route Frequency Provider Last Rate Last Dose  . 0.9 %  sodium chloride infusion   Intravenous Continuous 01/25/2016, MD 75 mL/hr at 01/20/16 2301    . acetaminophen (TYLENOL) tablet 650 mg  650 mg Oral Q6H PRN 2302, MD       Or  . acetaminophen (TYLENOL) suppository 650 mg  650 mg Rectal Q6H PRN Kathryne Hitch, MD      . alum & mag hydroxide-simeth (MAALOX/MYLANTA) 200-200-20 MG/5ML suspension 30 mL  30 mL Oral Q4H PRN 07-05-2001, MD      . aspirin EC tablet 325 mg  325 mg Oral BID PC Kathryne Hitch, MD   325 mg at 01/22/16 1635  . diphenhydrAMINE (BENADRYL) 12.5 MG/5ML elixir 12.5-25 mg  12.5-25 mg Oral Q4H PRN 01-26-1985, MD      . docusate sodium (COLACE) capsule 100 mg  100 mg Oral BID Kathryne Hitch, MD   100 mg at 01/22/16 2133  . HYDROmorphone (DILAUDID) injection 1 mg  1 mg Intravenous Q2H PRN 2134, MD   1 mg at 01/21/16 1935  . menthol-cetylpyridinium (CEPACOL) lozenge 3 mg  1 lozenge Oral PRN 01/23/16, MD       Or  . phenol (CHLORASEPTIC) mouth spray 1 spray  1 spray Mouth/Throat PRN  Kathryne Hitch, MD      . methocarbamol (ROBAXIN) tablet 500 mg  500 mg Oral Q6H PRN Kathryne Hitch, MD   500 mg at 01/23/16 0501   Or  . methocarbamol (ROBAXIN) 500 mg in dextrose 5 % 50 mL IVPB  500 mg Intravenous Q6H PRN Kathryne Hitch, MD      . metoCLOPramide (REGLAN) tablet 5-10 mg  5-10 mg Oral Q8H PRN Kathryne Hitch, MD       Or  . metoCLOPramide (REGLAN) injection 5-10 mg  5-10 mg Intravenous Q8H PRN Kathryne Hitch, MD      . ondansetron [facility]) tablet 4 mg  4 mg Oral Q6H PRN Kathryne Hitch, MD       Or  . ondansetron [facility]) injection 4 mg  4 mg Intravenous Q6H PRN Kathryne Hitch, MD      . oxyCODONE (Oxy IR/ROXICODONE) immediate release tablet 5-15 mg  5-15 mg Oral Q3H PRN Kathryne Hitch, MD   15 mg at 01/23/16 0805  . pantoprazole (PROTONIX) EC tablet 40 mg  40 mg Oral Daily Kathryne Hitch, MD   40 mg at 01/22/16 0855  .  zolpidem (AMBIEN) tablet 5 mg  5 mg Oral QHS PRN,MR X 1 Kathryne Hitch, MD         Discharge Medications: Please see discharge summary for a list of discharge medications.  Relevant Imaging Results:  Relevant Lab Results:   Additional Information SSN: [id number]  Rondel Baton, LCSW

## 2016-01-23 NOTE — Progress Notes (Signed)
Physical Therapy Treatment Patient Details Name: Sean Mccoy MRN: [id number] DOB: Dec 01, 1961 Today's Date: 01/23/2016    History of Present Illness Pt is a 55 y.o. male admitted on 01/20/16 for R THA with a direct anterior approach. Pt has other significnat PMH of HTN, arthritis, anxiety, and PTSD.    PT Comments    Pt was received sitting EOB, and reported pain as 9/10 yet was willing to work in tx. Pt completed standing THA exercises, but required multiple cues to stand straight and to breathe while exercising. Pt successfully ambulated approx 75 feet with RW and min guard assist for safety. Pt requires reassurance that he can WBAT on RLE. He defaults to nearly offsetting all of his weight through his arms on RW. Once reassured he is allowed to [facility], he began to ambulate with a more normal gait pattern.   Follow Up Recommendations  SNF     Equipment Recommendations  Rolling walker with 5" wheels;3in1 (PT)       Precautions / Restrictions Precautions Precautions: None (direct anterior approach ) Precaution Comments: Reinforced Pt can and should WBAT on RLE  Restrictions Weight Bearing Restrictions: Yes RLE Weight Bearing: Weight bearing as tolerated    Mobility  Bed Mobility Overal bed mobility:  (Pt received sitting EOB and returned sitting EOB )                Transfers Overall transfer level: Needs assistance Equipment used: Rolling walker (2 wheeled) Transfers: Sit to/from Stand Sit to Stand: Min guard         General transfer comment: Pt requies min guard assist for safety especially after he has been sitting for a long period of time prior to moving. Pt requires cues to slow down and for hand placement on bed or armrest when pushing up into standing from seated position.  Ambulation/Gait Ambulation/Gait assistance: Min guard Ambulation Distance (Feet): 75 Feet Assistive device: Rolling walker (2 wheeled) Gait Pattern/deviations: Step-through  pattern;Antalgic;Decreased weight shift to right Gait velocity: decreased Gait velocity interpretation: Below normal speed for age/gender General Gait Details: Pt encouraged to slow down and WBAT through RLE. Pt decreased his step length, which better allowed him to WBAT on RLE. Once Pt has been ambulating for a few minutes, his stride becomes a bit more fluid as he reports feeling "less stiff". Pt continues to require verbal cues to stand tall and dorsiflex his R foot when ambulating.                          Balance Overall balance assessment: Needs assistance Sitting-balance support: No upper extremity supported;Feet supported Sitting balance-Leahy Scale: Good     Standing balance support: Bilateral upper extremity supported Standing balance-Leahy Scale: Fair Standing balance comment: Pt requires min guard assist for safety to maintain standing balance.                     Cognition Arousal/Alertness: Awake/alert Behavior During Therapy: WFL for tasks assessed/performed Overall Cognitive Status: Within Functional Limits for tasks assessed                      Exercises Total Joint Exercises Hip ABduction/ADduction: AAROM;Right;10 reps;Standing Knee Flexion: AAROM;Right;10 reps;Standing Marching in Standing: AAROM;Right;10 reps Standing Hip Extension: AAROM;Right;10 reps        Pertinent Vitals/Pain Pain Assessment: 0-10 Pain Score: 9  Pain Location: R hip  Pain Descriptors / Indicators: Aching Pain Intervention(s): Limited activity within  patient's tolerance;Monitored during session           PT Goals (current goals can now be found in the care plan section) Acute Rehab PT Goals Patient Stated Goal: Decrease pain  PT Goal Formulation: With patient Time For Goal Achievement: 02/04/16 Potential to Achieve Goals: Good Progress towards PT goals: Progressing toward goals    Frequency  7X/week    PT Plan Current plan remains appropriate        End of Session Equipment Utilized During Treatment: Gait belt Activity Tolerance: Patient limited by pain Patient left: in bed;with call bell/phone within reach     Time: 2952-8413 PT Time Calculation (min) (ACUTE ONLY): 20 min  Charges:  1 therapeutic exercise  [place], [place] [phone] office Chase Picket 01/23/2016, 4:52 PM

## 2016-01-23 NOTE — Clinical Social Work Placement (Signed)
   CLINICAL SOCIAL WORK PLACEMENT  NOTE  Date:  01/23/2016  Patient Details  Name: Carrel Leather MRN: [id number] Date of Birth: [birth year]  Clinical Social Work is seeking post-discharge placement for this patient at the Skilled  Nursing Facility level of care (*CSW will initial, date and re-position this form in  chart as items are completed):  Yes   Patient/family provided with [place] Clinical Social Work Department's list of facilities offering this level of care within the geographic area requested by the patient (or if unable, by the patient's family).  Yes   Patient/family informed of their freedom to choose among providers that offer the needed level of care, that participate in Medicare, Medicaid or managed care program needed by the patient, have an available bed and are willing to accept the patient.  Yes   Patient/family informed of [facility]'s ownership interest in [facility] and [facility], as well as of the fact that they are under no obligation to receive care at these facilities.  PASRR submitted to EDS on 01/23/16     PASRR number received on 01/23/16     Existing PASRR number confirmed on       FL2 transmitted to all facilities in geographic area requested by pt/family on 01/23/16     FL2 transmitted to all facilities within larger geographic area on       Patient informed that his/her managed care company has contracts with or will negotiate with certain facilities, including the following:        Yes   Patient/family informed of bed offers received.  Patient chooses bed at       Physician recommends and patient chooses bed at      Patient to be transferred to   on 01/23/16.  Patient to be transferred to facility by [facility]     Patient family notified on 01/23/16 of transfer.  Name of family member notified:  patient is alert and oriented with no family at bedside.  CSW reviewed disposition plans with patient.  Patient declined CSW need to  contact family.     PHYSICIAN Please sign FL2, Please prepare prescriptions, Please prepare priority discharge summary, including medications     Additional Comment:    _______________________________________________ Rondel Baton, LCSW 01/23/2016, 11:21 AM

## 2016-01-23 NOTE — Anesthesia Postprocedure Evaluation (Signed)
Anesthesia Post Note  Patient: Sean Mccoy  Procedure(s) Performed: Procedure(s) (LRB): RIGHT TOTAL HIP ARTHROPLASTY ANTERIOR APPROACH (Right)  Patient location during evaluation: PACU Anesthesia Type: General Level of consciousness: awake and alert Pain management: pain level controlled Vital Signs Assessment: post-procedure vital signs reviewed and stable Respiratory status: spontaneous breathing, nonlabored ventilation, respiratory function stable and patient connected to nasal cannula oxygen Cardiovascular status: blood pressure returned to baseline and stable Postop Assessment: no signs of nausea or vomiting Anesthetic complications: no    Last Vitals:  Filed Vitals:   01/22/16 2109 01/23/16 0453  BP: 125/69 140/72  Pulse: 94 91  Temp: 37.1 C 36.8 C  Resp: 18 16    Last Pain:  Filed Vitals:   01/23/16 0641  PainSc: Asleep                 Cecile Hearing

## 2016-01-23 NOTE — Clinical Social Work Note (Signed)
Clinical Social Worker facilitated patient discharge including contacting patient family and facility to confirm patient discharge plans.  Clinical information faxed to facility and family agreeable with plan.  CSW arranged ambulance transport via [facility] to [place].  RN to call report prior to discharge.  Clinical Social Worker will sign off for now as social work intervention is no longer needed. Please consult us again if new need arises.  Jesse Zoie Sarin, LCSW [phone] 

## 2016-01-23 NOTE — Clinical Social Work Note (Signed)
CSW met with patient to review disposition plans.  During the initial assessment, the patient was refusing SNF.  Patient has now reconsidered his options and worked with PT and now realizes SNF for STR is a feasible option for him status post hip surgery with no caregiver/home support.  Patient's payer source is Medicaid.  Asst Director willing to offer Letter of Guarantee for 30 days to facility offering STR to patient.  Liaison from [facility] completing face-to-face with patient to determine acceptability.  Nonnie Done, LCSW [phone]  5N1-9; 2S 15-16 and [place] Licensed Clinical Social Worker

## 2016-01-23 NOTE — Clinical Social Work Placement (Signed)
   CLINICAL SOCIAL WORK PLACEMENT  NOTE  Date:  01/23/2016  Patient Details  Name: Sean Mccoy MRN: [id number] Date of Birth: 03-20-1961  Clinical Social Work is seeking post-discharge placement for this patient at the Skilled  Nursing Facility level of care (*CSW will initial, date and re-position this form in  chart as items are completed):  Yes   Patient/family provided with [place] Clinical Social Work Department's list of facilities offering this level of care within the geographic area requested by the patient (or if unable, by the patient's family).  Yes   Patient/family informed of their freedom to choose among providers that offer the needed level of care, that participate in Medicare, Medicaid or managed care program needed by the patient, have an available bed and are willing to accept the patient.  Yes   Patient/family informed of [facility]'s ownership interest in [facility] and [facility], as well as of the fact that they are under no obligation to receive care at these facilities.  PASRR submitted to EDS on 01/23/16     PASRR number received on 01/23/16     Existing PASRR number confirmed on       FL2 transmitted to all facilities in geographic area requested by pt/family on 01/23/16     FL2 transmitted to all facilities within larger geographic area on       Patient informed that his/her managed care company has contracts with or will negotiate with certain facilities, including the following:        Yes   Patient/family informed of bed offers received.  Patient chooses bed at [facility]     Physician recommends and patient chooses bed at      Patient to be transferred to [facility] on 01/23/16.  Patient to be transferred to facility by [facility]     Patient family notified on 01/23/16 of transfer.  Name of family member notified:  patient is alert and oriented with no family at bedside.  CSW reviewed  disposition plans with patient.  Patient declined CSW need to contact family. 1/27 CSW contacted patient brother's Deniece Portela and Onalee Hua with patient permission     PHYSICIAN Please sign FL2, Please prepare prescriptions, Please prepare priority discharge summary, including medications     Additional Comment:    Macario Golds, LCSW [phone]

## 2016-01-23 NOTE — Progress Notes (Signed)
Patient ID: Sean Mccoy, male   DOB: [birth year], 55 y.o.   MRN: [id number] I spoke with Sean Mccoy and he has agreed that he would benefit best from skilled nursing.  He understands this fully.  Medically he is stable and can be discharged to skilled nursing today.

## 2016-01-23 NOTE — Progress Notes (Signed)
Physical Therapy Treatment Patient Details Name: Sean Mccoy MRN: [id number] DOB: January 30, 1961 Today's Date: 01/23/2016    History of Present Illness Pt is a 55 y.o. male admitted on 01/20/16 for R THA with a direct anterior approach. Pt has other significnat PMH of HTN, arthritis, anxiety, and PTSD.    PT Comments    Pt demonstrated ability to ambulate with min guard assist for approx 75 feet with RW, but requires verbal cues and reminders to WBAT through RLE and to dorsiflex his R foot. Pt completed standing THA exercises. PT will continue to follow, but Pt plans to d/c today.   Follow Up Recommendations  SNF     Equipment Recommendations  Rolling walker with 5" wheels;3in1 (PT)       Precautions / Restrictions Precautions Precautions None Precaution Comments: Reinforced Pt can WBAT on RLE as he seemed hesitant to place weight through RLE Restrictions Weight Bearing Restrictions: Yes RLE Weight Bearing: Weight bearing as tolerated    Mobility  Bed Mobility Overal bed mobility:  (Pt received OOB and returned to recliner )                Transfers Overall transfer level: Needs assistance Equipment used: Rolling walker (2 wheeled) Transfers: Sit to/from Stand Sit to Stand: Min guard         General transfer comment: Pt requires min guard assist for safety. Pt requies verbal cues to back all the way up to edge of recliner prior to reaching for armrests to sit.   Ambulation/Gait Ambulation/Gait assistance: Min guard Ambulation Distance (Feet): 75 Feet Assistive device: Rolling walker (2 wheeled) Gait Pattern/deviations: Step-through pattern;Decreased dorsiflexion - right;Antalgic;Decreased weight shift to right Gait velocity: decreased Gait velocity interpretation: Below normal speed for age/gender General Gait Details: Pt requies multipl verbal cues to stand straight and to dorsiflex his R foot to accept weight on R side. Pt required reinforcement that it was safe  to WB through RLE while ambulating. After explanation of needed R dorsiflexion, Pt able to implement, but does not maintain for long before requiring same cue again.                    Balance Overall balance assessment: Needs assistance Sitting-balance support: Feet supported;No upper extremity supported Sitting balance-Leahy Scale: Good     Standing balance support: Bilateral upper extremity supported Standing balance-Leahy Scale: Fair                      Cognition Arousal/Alertness: Awake/alert Behavior During Therapy: WFL for tasks assessed/performed Overall Cognitive Status: Within Functional Limits for tasks assessed                      Exercises Total Joint Exercises Hip ABduction/ADduction: AAROM;Right;10 reps;Standing Knee Flexion: AAROM;Right;10 reps;Standing Marching in Standing: AAROM;Right;10 reps Standing Hip Extension: AAROM;Right;10 reps    General Comments General comments (skin integrity, edema, etc.): Pt reported concern about his R knee in addition to his R hip. Pt was more guarded in putting weight through his RLE.       Pertinent Vitals/Pain Pain Assessment: 0-10 Pain Score: 8  Pain Location: R hip and R knee Pain Descriptors / Indicators: Aching Pain Intervention(s): Limited activity within patient's tolerance;Monitored during session;Repositioned           PT Goals (current goals can now be found in the care plan section) Acute Rehab PT Goals PT Goal Formulation: With patient Time For Goal Achievement: 02/04/16 Potential  to Achieve Goals: Good Progress towards PT goals: Progressing toward goals    Frequency  7X/week    PT Plan Current plan remains appropriate       End of Session Equipment Utilized During Treatment: Gait belt Activity Tolerance: Patient limited by pain Patient left: in chair;with call bell/phone within reach     Time: 1101-1118 PT Time Calculation (min) (ACUTE ONLY): 17 min  Charges:     1 therapeutic exercise                     [place], [place] [phone] office Chase Picket 01/23/2016, 11:32 AM

## 2016-01-26 ENCOUNTER — Encounter: Payer: Self-pay | Admitting: Adult Health

## 2016-01-26 ENCOUNTER — Non-Acute Institutional Stay (SKILLED_NURSING_FACILITY): Payer: Medicaid Other | Admitting: Adult Health

## 2016-01-26 DIAGNOSIS — M810 Age-related osteoporosis without current pathological fracture: Secondary | ICD-10-CM | POA: Diagnosis not present

## 2016-01-26 DIAGNOSIS — M87051 Idiopathic aseptic necrosis of right femur: Secondary | ICD-10-CM | POA: Diagnosis not present

## 2016-01-26 DIAGNOSIS — K5903 Drug induced constipation: Secondary | ICD-10-CM

## 2016-01-26 DIAGNOSIS — Z96649 Presence of unspecified artificial hip joint: Secondary | ICD-10-CM | POA: Diagnosis not present

## 2016-01-26 DIAGNOSIS — D62 Acute posthemorrhagic anemia: Secondary | ICD-10-CM | POA: Diagnosis not present

## 2016-01-26 DIAGNOSIS — Z96641 Presence of right artificial hip joint: Secondary | ICD-10-CM

## 2016-01-26 NOTE — Progress Notes (Signed)
Patient ID: Sean Mccoy, male   DOB: 11/21/1961, 55 y.o.   MRN: [id number]    Facility: [facility]       No Known Allergies  Chief Complaint  Patient presents with  . Hospitalization Follow-up    HPI:  He has been hospitalized for a right hip replacement due to avascular necrosis. He is here for short term rehab. He does have significant right hip pain and is having constipation. He is here for short term rehab with his goal to return home. There are no nursing concerns at this time.     Past Medical History  Diagnosis Date  . Hypertension   . Arthritis   . Knee fracture   . Groin injury   . Ankle injury   . Avascular necrosis (HCC)   . Anxiety   . Depression   . PTSD (post-traumatic stress disorder)     Past Surgical History  Procedure Laterality Date  . Knee surgery    . Total hip arthroplasty Right 01/20/2016    Procedure: RIGHT TOTAL HIP ARTHROPLASTY ANTERIOR APPROACH;  Surgeon: Kathryne Hitch, MD;  Location[facility] OR;  Service: Orthopedics;  Laterality: Right;    VITAL SIGNS BP 135/68 mmHg  Pulse 78  Temp(Src) 98.1 F (36.7 C)  Ht 6\' 1"  (1.854 m)  Wt 165 lb (74.844 kg)  BMI 21.77 kg/m2  Patient's Medications  New Prescriptions   No medications on file  Previous Medications   ASPIRIN EC 325 MG EC TABLET    Take 1 tablet (325 mg total) by mouth 2 (two) times daily after a meal.   METHOCARBAMOL (ROBAXIN) 500 MG TABLET    Take 1 tablet (500 mg total) by mouth every 6 (six) hours as needed for muscle spasms.   OXYCODONE-ACETAMINOPHEN (PERCOCET/ROXICET) 5-325 MG TABLET    Take 1-2 tablets by mouth every 4 (four) hours as needed for severe pain.  Modified Medications   No medications on file  Discontinued Medications   No medications on file     SIGNIFICANT DIAGNOSTIC EXAMS  01-09-16: pelvic x-ray: Avascular necrosis of the right femoral head with cortical collapse. Distal femoral bone infarct.  Proximal tibial cortical irregularity, likely  chronic. Advanced osteopenia.  No definite acute fracture.  01-09-16: right femur x-ray: Avascular necrosis of the right femoral head with cortical collapse. Distal femoral bone infarct. Proximal tibial cortical irregularity, likely chronic. Advanced osteopenia.  No definite acute fracture.  01-09-16: right hip x-ray: Avascular necrosis of the right femoral head with cortical collapse. Distal femoral bone infarct. Proximal tibial cortical irregularity, likely chronic. Advanced osteopenia.  No definite acute fracture    LABS REVIEWED:   01-13-16: wbc 6.0; hgb 13.3; hct 39.6; mcv 95.2; plt 233; glucose 110; bun 6; creat 0.74; k+ 3.9; na++132 01-21-16: wbc 6.0; hgb 9.3; hct 27.7; mcv 96.2; plt 176; glucose 149; bun 5; creat 0.76; k+ 4.2; na++134 01-22-16: wbc 7.7; hb 9.2; hct 28.8; mcv 97.3; plt 188    Review of Systems  Constitutional: Negative for malaise/fatigue.  Respiratory: Negative for cough and shortness of breath.   Cardiovascular: Negative for chest pain, palpitations and leg swelling.  Gastrointestinal: Positive for constipation. Negative for heartburn and abdominal pain.  Musculoskeletal: Positive for joint pain. Negative for myalgias.       Right hip and knee pain   Skin:       Has right hip incision line   Psychiatric/Behavioral: Negative for depression. The patient is not nervous/anxious.     Physical Exam  Constitutional: He is oriented to person, place, and time. He appears well-developed and well-nourished. No distress.  Eyes: Conjunctivae are normal.  Neck: Neck supple. No JVD present. No thyromegaly present.  Cardiovascular: Normal rate, regular rhythm and intact distal pulses.   Respiratory: Effort normal and breath sounds normal. No respiratory distress. He has no wheezes.  GI: Soft. Bowel sounds are normal. He exhibits no distension. There is no tenderness.  Musculoskeletal: He exhibits no edema.  Able to move all extremities  Is status post right hip replacement     Lymphadenopathy:    He has no cervical adenopathy.  Neurological: He is alert and oriented to person, place, and time.  Skin: Skin is warm and dry. He is not diaphoretic.  Incision line without signs of infection present   Psychiatric: He has a normal mood and affect.      ASSESSMENT/ PLAN:  1. Avascular necrosis of right hip status post right hip replacement: will continue therapy as directed and will follow up with orthopedics as indicated. Will stop the percocet and will begin oxycodone 10 mg every 4 hours as needed for pain management to lower tylenol intake; will continue robaxin 500 mg every 6 hours as needed will monitor will continue asa 325 mg twice daily   2. Hypertension: is stable is presently not on medications will monitor his status.   3. Anemia: acute blood loss from his hip surgery: will begin iron twice daily for 60 days  4. Constipation: will begin miralax daily  5. Osteoporosis: will begin fosamax 70 mg weekly; tums 500 mg three times daily and vit d 2000 units daily     Time spent with patient  50  minutes >50% time spent counseling; reviewing medical record; tests; labs; and developing future plan of care      Synthia Innocent NP [facility]  Contact [phone] Monday through Friday 8am- 5pm  After hours call [phone]

## 2016-01-27 ENCOUNTER — Encounter: Payer: Self-pay | Admitting: Internal Medicine

## 2016-01-27 ENCOUNTER — Non-Acute Institutional Stay (SKILLED_NURSING_FACILITY): Payer: Medicaid Other | Admitting: Internal Medicine

## 2016-01-27 DIAGNOSIS — M25551 Pain in right hip: Secondary | ICD-10-CM

## 2016-01-27 DIAGNOSIS — K5900 Constipation, unspecified: Secondary | ICD-10-CM

## 2016-01-27 DIAGNOSIS — N400 Enlarged prostate without lower urinary tract symptoms: Secondary | ICD-10-CM | POA: Diagnosis not present

## 2016-01-27 DIAGNOSIS — Z96641 Presence of right artificial hip joint: Secondary | ICD-10-CM | POA: Diagnosis not present

## 2016-01-27 NOTE — Progress Notes (Signed)
Patient ID: Sean Mccoy, male   DOB: 01/29/1961, 54 y.o.   MRN: [id number]    HISTORY AND PHYSICAL   DATE: 01/27/16  Location:  [facility]    Place of Service: SNF (31)   Extended Emergency Contact Information Primary Emergency Contact: Russ,David Address: [address]           [address] [place] Home Phone: [phone] Mobile Phone: [phone] Relation: Brother Secondary Emergency Contact: Cragle,Wayne  [place] Mobile Phone: [phone] Relation: Brother  Advanced Directive information  FULL CODE  Chief Complaint  Patient presents with  . New Admit To SNF    HPI:  54 yo male seen today for admission into SNF following hospital stay for right hip avascular necrosis. He underwent right THR on 1/24th. He was tx preop with abx coverage. No post op complications. He presents for short term rehab.   Pain not controlled with percocet. Pain interrupts sleep. He is getting PT/OT. Wound care following wound. He has constipation. He c/o urinary sx's with frequency and urgency x 6 mos. No f/c. No hematuria. He was dx with BPH about 1 yr ago but does not take any meds.   HTN - BP stable off meds  PTSD/depression/anxiety - stable off meds  Past Medical History  Diagnosis Date  . Hypertension   . Arthritis   . Knee fracture   . Groin injury   . Ankle injury   . Avascular necrosis (HCC)   . Anxiety   . Depression   . PTSD (post-traumatic stress disorder)     Past Surgical History  Procedure Laterality Date  . Knee surgery    . Total hip arthroplasty Right 01/20/2016    Procedure: RIGHT TOTAL HIP ARTHROPLASTY ANTERIOR APPROACH;  Surgeon: Christopher Y Blackman, MD;  Location: [facility] OR;  Service: Orthopedics;  Laterality: Right;    Patient Care Team: Valerie A Keck, NP as PCP - General (Internal Medicine)  Social History   Social History  . Marital Status: Single    Spouse Name: N/A  . Number of  Children: N/A  . Years of Education: N/A   Occupational History  . Not on file.   Social History Main Topics  . Smoking status: Never Smoker   . Smokeless tobacco: Never Used  . Alcohol Use: 1.2 oz/week    2 Cans of beer per week     Comment: occ  . Drug Use: No  . Sexual Activity: Yes    Birth Control/ Protection: Condom   Other Topics Concern  . Not on file   Social History Narrative     reports that he has never smoked. He has never used smokeless tobacco. He reports that he drinks about 1.2 oz of alcohol per week. He reports that he does not use illicit drugs.  Family History  Problem Relation Age of Onset  . Diabetes Mother   . Hypertension Mother   . Diabetes Father   . Hypertension Father    Family Status  Relation Status Death Age  . Mother Deceased   . Father Deceased      There is no immunization history on file for this patient.  No Known Allergies  Medications: Patient's Medications  New Prescriptions   No medications on file  Previous Medications   ASPIRIN EC 325 MG EC TABLET    Take 1 tablet (325 mg total) by mouth 2 (two) times daily after a meal.   METHOCARBAMOL (ROBAXIN) 500 MG TABLET      Take 1 tablet (500 mg total) by mouth every 6 (six) hours as needed for muscle spasms.   OXYCODONE-ACETAMINOPHEN (PERCOCET/ROXICET) 5-325 MG TABLET    Take 1-2 tablets by mouth every 4 (four) hours as needed for severe pain.  Modified Medications   No medications on file  Discontinued Medications   No medications on file    Review of Systems  Gastrointestinal: Positive for constipation.  Musculoskeletal: Positive for joint swelling, arthralgias and gait problem.  Skin: Positive for wound.  Psychiatric/Behavioral: Positive for sleep disturbance.  All other systems reviewed and are negative.   Filed Vitals:   01/27/16 1101  BP: 135/68  Pulse: 78  Temp: 98.1 F (36.7 C)  Weight: 165 lb (74.844 kg)   Body mass index is 21.77 kg/(m^2).  Physical  Exam  Constitutional: He is oriented to person, place, and time. He appears well-developed and well-nourished.  looks uncomfortable in NAD  HENT:  Mouth/Throat: Oropharynx is clear and moist.  Eyes: Pupils are equal, round, and reactive to light. No scleral icterus.  Neck: Neck supple. Carotid bruit is not present. No thyromegaly present.  Cardiovascular: Normal rate, regular rhythm, normal heart sounds and intact distal pulses.  Exam reveals no gallop and no friction rub.   No murmur heard. +1 pitting RLE edema. No calf TTP b/l. No LLE edema  Pulmonary/Chest: Effort normal and breath sounds normal. He has no wheezes. He has no rales. He exhibits no tenderness.  Abdominal: Soft. Bowel sounds are normal. He exhibits no distension, no abdominal bruit, no pulsatile midline mass and no mass. There is no tenderness. There is no rebound and no guarding.  Musculoskeletal: He exhibits edema and tenderness.  Right lateral thigh dressing slightly dried blood but no d/c. No redness and min swelling. Gait antalgic  Lymphadenopathy:    He has no cervical adenopathy.  Neurological: He is alert and oriented to person, place, and time.  Skin: Skin is warm and dry. No rash noted.  Psychiatric: He has a normal mood and affect. His behavior is normal. Judgment and thought content normal.     Labs reviewed: Admission on 01/20/2016, Discharged on 01/23/2016  Component Date Value Ref Range Status  . WBC 01/21/2016 6.0  4.0 - 10.5 K/uL Final  . RBC 01/21/2016 2.88* 4.22 - 5.81 MIL/uL Final  . Hemoglobin 01/21/2016 9.3* 13.0 - 17.0 g/dL Final  . HCT 01/21/2016 27.7* 39.0 - 52.0 % Final  . MCV 01/21/2016 96.2  78.0 - 100.0 fL Final  . MCH 01/21/2016 32.3  26.0 - 34.0 pg Final  . MCHC 01/21/2016 33.6  30.0 - 36.0 g/dL Final  . RDW 01/21/2016 14.3  11.5 - 15.5 % Final  . Platelets 01/21/2016 176  150 - 400 K/uL Final  . Sodium 01/21/2016 134* 135 - 145 mmol/L Final  . Potassium 01/21/2016 4.2  3.5 - 5.1  mmol/L Final  . Chloride 01/21/2016 93* 101 - 111 mmol/L Final  . CO2 01/21/2016 25  22 - 32 mmol/L Final  . Glucose, Bld 01/21/2016 149* 65 - 99 mg/dL Final  . BUN 01/21/2016 5* 6 - 20 mg/dL Final  . Creatinine, Ser 01/21/2016 0.76  0.61 - 1.24 mg/dL Final  . Calcium 01/21/2016 8.4* 8.9 - 10.3 mg/dL Final  . GFR calc non Af Amer 01/21/2016 >60  >60 mL/min Final  . GFR calc Af Amer 01/21/2016 >60  >60 mL/min Final   Comment: (NOTE) The eGFR has been calculated using the CKD EPI equation. This calculation has not  been validated in all clinical situations. eGFR's persistently <60 mL/min signify possible Chronic Kidney Disease.   . Anion gap 01/21/2016 16* 5 - 15 Final  . WBC 01/22/2016 7.7  4.0 - 10.5 K/uL Final  . RBC 01/22/2016 2.96* 4.22 - 5.81 MIL/uL Final  . Hemoglobin 01/22/2016 9.2* 13.0 - 17.0 g/dL Final  . HCT 01/22/2016 28.8* 39.0 - 52.0 % Final  . MCV 01/22/2016 97.3  78.0 - 100.0 fL Final  . MCH 01/22/2016 31.1  26.0 - 34.0 pg Final  . MCHC 01/22/2016 31.9  30.0 - 36.0 g/dL Final  . RDW 01/22/2016 14.3  11.5 - 15.5 % Final  . Platelets 01/22/2016 188  150 - 400 K/uL Final  Hospital Outpatient Visit on 01/13/2016  Component Date Value Ref Range Status  . MRSA, PCR 01/13/2016 NEGATIVE  NEGATIVE Final  . Staphylococcus aureus 01/13/2016 NEGATIVE  NEGATIVE Final   Comment:        The Xpert SA Assay (FDA approved for NASAL specimens in patients over 18 years of age), is one component of a comprehensive surveillance program.  Test performance has been validated by [facility] for patients greater than or equal to 75 year old. It is not intended to diagnose infection nor to guide or monitor treatment.   . Sodium 01/13/2016 132* 135 - 145 mmol/L Final  . Potassium 01/13/2016 3.9  3.5 - 5.1 mmol/L Final  . Chloride 01/13/2016 100* 101 - 111 mmol/L Final  . CO2 01/13/2016 17* 22 - 32 mmol/L Final  . Glucose, Bld 01/13/2016 110* 65 - 99 mg/dL Final  . BUN 01/13/2016 6  6  - 20 mg/dL Final  . Creatinine, Ser 01/13/2016 0.74  0.61 - 1.24 mg/dL Final  . Calcium 01/13/2016 10.0  8.9 - 10.3 mg/dL Final  . GFR calc non Af Amer 01/13/2016 >60  >60 mL/min Final  . GFR calc Af Amer 01/13/2016 >60  >60 mL/min Final   Comment: (NOTE) The eGFR has been calculated using the CKD EPI equation. This calculation has not been validated in all clinical situations. eGFR's persistently <60 mL/min signify possible Chronic Kidney Disease.   . Anion gap 01/13/2016 15  5 - 15 Final  . WBC 01/13/2016 6.0  4.0 - 10.5 K/uL Final  . RBC 01/13/2016 4.16* 4.22 - 5.81 MIL/uL Final  . Hemoglobin 01/13/2016 13.3  13.0 - 17.0 g/dL Final  . HCT 01/13/2016 39.6  39.0 - 52.0 % Final  . MCV 01/13/2016 95.2  78.0 - 100.0 fL Final  . MCH 01/13/2016 32.0  26.0 - 34.0 pg Final  . MCHC 01/13/2016 33.6  30.0 - 36.0 g/dL Final  . RDW 01/13/2016 14.2  11.5 - 15.5 % Final  . Platelets 01/13/2016 233  150 - 400 K/uL Final    Dg Pelvis 1-2 Views  01/09/2016  CLINICAL DATA:  55 year old male with fall and right hip and lower extremity pain. EXAM: RIGHT KNEE - COMPLETE 4+ VIEW; RIGHT FEMUR 2 VIEWS; PELVIS - 1-2 VIEW COMPARISON:  None. FINDINGS: There is sclerotic changes of the right femoral head with collapse and flattening of the right femoral head cortex compatible with avascular necrosis and fracture. No other pelvic acute fracture or dislocation noted. There is diffuse osteopenia of the femur. There is S serpiginous sclerotic lesion involving the distal femoral metadiaphysis compatible with bone infarcts. No acute fracture. There is no dislocation of the knee. There is a focal area of cortical irregularity involving the proximal tibia, likely related to an old  healed fracture deformity. No definite acute fracture identified, however evaluation is limited due to osteopenia. CT may provide better evaluation if there is high clinical suspicion for fracture. IMPRESSION: Avascular necrosis of the right femoral  head with cortical collapse. Distal femoral bone infarct. Proximal tibial cortical irregularity, likely chronic. Advanced osteopenia.  No definite acute fracture. Electronically Signed   By: Arash  Radparvar M.D.   On: 01/09/2016 22:51   Dg Knee Complete 4 Views Right  01/09/2016  CLINICAL DATA:  54-year-old male with fall and right hip and lower extremity pain. EXAM: RIGHT KNEE - COMPLETE 4+ VIEW; RIGHT FEMUR 2 VIEWS; PELVIS - 1-2 VIEW COMPARISON:  None. FINDINGS: There is sclerotic changes of the right femoral head with collapse and flattening of the right femoral head cortex compatible with avascular necrosis and fracture. No other pelvic acute fracture or dislocation noted. There is diffuse osteopenia of the femur. There is S serpiginous sclerotic lesion involving the distal femoral metadiaphysis compatible with bone infarcts. No acute fracture. There is no dislocation of the knee. There is a focal area of cortical irregularity involving the proximal tibia, likely related to an old healed fracture deformity. No definite acute fracture identified, however evaluation is limited due to osteopenia. CT may provide better evaluation if there is high clinical suspicion for fracture. IMPRESSION: Avascular necrosis of the right femoral head with cortical collapse. Distal femoral bone infarct. Proximal tibial cortical irregularity, likely chronic. Advanced osteopenia.  No definite acute fracture. Electronically Signed   By: Arash  Radparvar M.D.   On: 01/09/2016 22:51   Dg Hip Port Unilat With Pelvis 1v Right  01/20/2016  CLINICAL DATA:  Status post right hip replacement EXAM: DG HIP (WITH OR WITHOUT PELVIS) 1V PORT RIGHT COMPARISON:  None. FINDINGS: Right hip replacement is now seen. No fracture or dislocation is noted. No gross soft tissue abnormality is seen. IMPRESSION: Status post right hip replacement without acute abnormality. Electronically Signed   By: Mark  Lukens M.D.   On: 01/20/2016 15:45   Dg Hip  Operative Unilat With Pelvis Right  01/20/2016  CLINICAL DATA:  Right anterior total hip arthroplasty EXAM: OPERATIVE RIGHT HIP (WITH PELVIS IF PERFORMED) 2 VIEWS TECHNIQUE: Fluoroscopic spot image(s) were submitted for interpretation post-operatively. COMPARISON:  12/19/2015 FINDINGS: Changes of right hip replacement. Normal AP alignment. No hardware or bony complicating feature. IMPRESSION: Right hip replacement.  No complicating feature. Electronically Signed   By: Kevin  Dover M.D.   On: 01/20/2016 15:04   Dg Femur, Min 2 Views Right  01/09/2016  CLINICAL DATA:  54-year-old male with fall and right hip and lower extremity pain. EXAM: RIGHT KNEE - COMPLETE 4+ VIEW; RIGHT FEMUR 2 VIEWS; PELVIS - 1-2 VIEW COMPARISON:  None. FINDINGS: There is sclerotic changes of the right femoral head with collapse and flattening of the right femoral head cortex compatible with avascular necrosis and fracture. No other pelvic acute fracture or dislocation noted. There is diffuse osteopenia of the femur. There is S serpiginous sclerotic lesion involving the distal femoral metadiaphysis compatible with bone infarcts. No acute fracture. There is no dislocation of the knee. There is a focal area of cortical irregularity involving the proximal tibia, likely related to an old healed fracture deformity. No definite acute fracture identified, however evaluation is limited due to osteopenia. CT may provide better evaluation if there is high clinical suspicion for fracture. IMPRESSION: Avascular necrosis of the right femoral head with cortical collapse. Distal femoral bone infarct. Proximal tibial cortical irregularity, likely chronic. Advanced   osteopenia.  No definite acute fracture. Electronically Signed   By: Arash  Radparvar M.D.   On: 01/09/2016 22:51     Assessment/Plan   ICD-9-CM ICD-10-CM   1. BPH (benign prostatic hypertrophy) 600.00 N40.0   2. Right hip pain 719.45 M25.551   3. History of total right hip arthroplasty  V43.64 Z96.641   4. Constipation, unspecified constipation type 564.00 K59.00     Check UA with micro  Start flomax 0.4mg daily for BPH  Cont pain med as ordered. Will change robaxin 500mg TID  Cont other meds as ordered  S. , D. O., F. A. C. O. I.  [facility] [address] [place], [place] [address] [phone] Cell (Monday-Friday 8 AM - 5 PM) [phone] After 5 PM and follow prompts  

## 2016-02-02 ENCOUNTER — Non-Acute Institutional Stay (SKILLED_NURSING_FACILITY): Payer: Medicaid Other | Admitting: Adult Health

## 2016-02-02 DIAGNOSIS — M25561 Pain in right knee: Secondary | ICD-10-CM

## 2016-02-17 ENCOUNTER — Encounter: Payer: Self-pay | Admitting: Adult Health

## 2016-02-17 DIAGNOSIS — M810 Age-related osteoporosis without current pathological fracture: Secondary | ICD-10-CM | POA: Insufficient documentation

## 2016-02-17 DIAGNOSIS — D62 Acute posthemorrhagic anemia: Secondary | ICD-10-CM | POA: Insufficient documentation

## 2016-02-17 DIAGNOSIS — K5903 Drug induced constipation: Secondary | ICD-10-CM | POA: Insufficient documentation

## 2016-02-17 DIAGNOSIS — M25561 Pain in right knee: Secondary | ICD-10-CM | POA: Insufficient documentation

## 2016-02-17 MED ORDER — ALENDRONATE SODIUM 70 MG PO TABS: 70.0000 mg | ORAL_TABLET | ORAL | Status: DC

## 2016-02-17 MED ORDER — CALCIUM CARBONATE ANTACID 500 MG PO CHEW: 1.0000 | CHEWABLE_TABLET | Freq: Three times a day (TID) | ORAL | Status: DC

## 2016-02-17 MED ORDER — FERROUS SULFATE 325 (65 FE) MG PO TABS: 325.0000 mg | ORAL_TABLET | Freq: Two times a day (BID) | ORAL | Status: DC

## 2016-02-17 MED ORDER — MENTHOL (TOPICAL ANALGESIC) 4 % EX GEL: 1.0000 "application " | Freq: Two times a day (BID) | CUTANEOUS | Status: DC

## 2016-02-17 MED ORDER — VITAMIN D 50 MCG (2000 UT) PO CAPS: 2000.0000 [IU] | ORAL_CAPSULE | Freq: Every day | ORAL | Status: DC

## 2016-02-17 NOTE — Progress Notes (Signed)
Patient ID: Sean Mccoy, male   DOB: [birth year], 55 y.o.   MRN: [id number]   Facility: [facility]       No Known Allergies  Chief Complaint  Patient presents with  . Acute Visit    right knee pain     HPI:  He is complaining of right knee pain. He tells me when he had his right hip replacement; the surgeon told him that he had to "twist" his right knee to "realign" his hip correctly. He tells me that his right knee pain is not being adequately managed at this time and would like further pain medication. He is able to ambulate with a walker at this time.    Past Medical History  Diagnosis Date  . Hypertension   . Arthritis   . Knee fracture   . Groin injury   . Ankle injury   . Avascular necrosis (HCC)   . Anxiety   . Depression   . PTSD (post-traumatic stress disorder)     Past Surgical History  Procedure Laterality Date  . Knee surgery    . Total hip arthroplasty Right 01/20/2016    Procedure: RIGHT TOTAL HIP ARTHROPLASTY ANTERIOR APPROACH;  Surgeon: Kathryne Hitch, MD;  Location: [facility] OR;  Service: Orthopedics;  Laterality: Right;    VITAL SIGNS BP 124/69 mmHg  Pulse 81  Temp(Src) 98.1 F (36.7 C)  Resp 18  Ht 5\' 8"  (1.727 m)  Wt 169 lb (76.658 kg)  BMI 25.70 kg/m2  Patient's Medications  New Prescriptions   No medications on file  Previous Medications   ALENDRONATE (FOSAMAX) 70 MG TABLET    Take 1 tablet (70 mg total) by mouth every 7 (seven) days. Take with a full glass of water on an empty stomach.   ASPIRIN EC 325 MG EC TABLET    Take 1 tablet (325 mg total) by mouth 2 (two) times daily after a meal.   CALCIUM CARBONATE (TUMS) 500 MG CHEWABLE TABLET    Chew 1 tablet (200 mg of elemental calcium total) by mouth 3 (three) times daily with meals.   CHOLECALCIFEROL (VITAMIN D) 2000 UNITS CAPS    Take 1 capsule (2,000 Units total) by mouth daily.   FERROUS SULFATE 325 (65 FE) MG TABLET    Take 1 tablet (325 mg total) by mouth 2 (two) times daily with  a meal.   METHOCARBAMOL (ROBAXIN) 500 MG TABLET    Take 1 tablet (500 mg total) by mouth every 6 (six) hours as needed for muscle spasms.   OXYCODONE-ACETAMINOPHEN (PERCOCET/ROXICET) 5-325 MG TABLET    Take 1-2 tablets by mouth every 4 (four) hours as needed for severe pain.  Modified Medications   No medications on file  Discontinued Medications   No medications on file     SIGNIFICANT DIAGNOSTIC EXAMS   01-09-16: pelvic x-ray: Avascular necrosis of the right femoral head with cortical collapse. Distal femoral bone infarct.  Proximal tibial cortical irregularity, likely chronic. Advanced osteopenia.  No definite acute fracture.  01-09-16: right femur x-ray: Avascular necrosis of the right femoral head with cortical collapse. Distal femoral bone infarct. Proximal tibial cortical irregularity, likely chronic. Advanced osteopenia.  No definite acute fracture.  01-09-16: right hip x-ray: Avascular necrosis of the right femoral head with cortical collapse. Distal femoral bone infarct. Proximal tibial cortical irregularity, likely chronic. Advanced osteopenia.  No definite acute fracture    LABS REVIEWED:   01-13-16: wbc 6.0; hgb 13.3; hct 39.6; mcv 95.2; plt 233; glucose  110; bun 6; creat 0.74; k+ 3.9; na++132 01-21-16: wbc 6.0; hgb 9.3; hct 27.7; mcv 96.2; plt 176; glucose 149; bun 5; creat 0.76; k+ 4.2; na++134 01-22-16: wbc 7.7; hb 9.2; hct 28.8; mcv 97.3; plt 188    Review of Systems  Constitutional: Negative for malaise/fatigue.  Respiratory: Negative for cough and shortness of breath.   Cardiovascular: Negative for chest pain, palpitations and leg swelling.  Gastrointestinal: negative for constipation. Negative for heartburn and abdominal pain.  Musculoskeletal: Positive for joint pain. Negative for myalgias.       Right  knee pain   Skin:       Has right hip incision line   Psychiatric/Behavioral: Negative for depression. The patient is not nervous/anxious.     Physical Exam    Constitutional: He is oriented to person, place, and time. He appears well-developed and well-nourished. No distress.  Eyes: Conjunctivae are normal.  Neck: Neck supple. No JVD present. No thyromegaly present.  Cardiovascular: Normal rate, regular rhythm and intact distal pulses.   Respiratory: Effort normal and breath sounds normal. No respiratory distress. He has no wheezes.  GI: Soft. Bowel sounds are normal. He exhibits no distension. There is no tenderness.  Musculoskeletal: He exhibits no edema.  Able to move all extremities  Is status post right hip replacement Trace amount of swelling present has full range of motion   Lymphadenopathy:    He has no cervical adenopathy.  Neurological: He is alert and oriented to person, place, and time.  Skin: Skin is warm and dry. He is not diaphoretic.  Incision line without signs of infection present   Psychiatric: He has a normal mood and affect.     ASSESSMENT/ PLAN:  1. Right knee pain: will use biofreeze twice daily and twice daily as needed and will monitor     Synthia Innocent NP [facility]  Contact [phone] Monday through Friday 8am- 5pm  After hours call [phone]

## 2016-02-19 ENCOUNTER — Non-Acute Institutional Stay (SKILLED_NURSING_FACILITY): Payer: Medicaid Other | Admitting: Adult Health

## 2016-02-19 ENCOUNTER — Encounter: Payer: Self-pay | Admitting: Adult Health

## 2016-02-19 DIAGNOSIS — D62 Acute posthemorrhagic anemia: Secondary | ICD-10-CM | POA: Diagnosis not present

## 2016-02-19 DIAGNOSIS — K5903 Drug induced constipation: Secondary | ICD-10-CM

## 2016-02-19 DIAGNOSIS — Z96649 Presence of unspecified artificial hip joint: Secondary | ICD-10-CM

## 2016-02-19 DIAGNOSIS — Z96641 Presence of right artificial hip joint: Secondary | ICD-10-CM

## 2016-02-19 DIAGNOSIS — M810 Age-related osteoporosis without current pathological fracture: Secondary | ICD-10-CM

## 2016-02-19 DIAGNOSIS — M87051 Idiopathic aseptic necrosis of right femur: Secondary | ICD-10-CM

## 2016-02-19 NOTE — Progress Notes (Signed)
Patient ID: Sean Mccoy, male   DOB: 11-29-1961, 55 y.o.   MRN: [id number]   Facility[facility]       No Known Allergies  Chief Complaint  Patient presents with  . Medical Management of Chronic Issues    Follow up    HPI:  He is a resident of this facility being seen for the management of his chronic illnesses. He is doing well. He is ambulating throughout the facility using a cane. His gait is stable. He is not voicing any complaints of pain or concerns. There are no nursing concerns at this time   Past Medical History  Diagnosis Date  . Hypertension   . Arthritis   . Knee fracture   . Groin injury   . Ankle injury   . Avascular necrosis (HCC)   . Anxiety   . Depression   . PTSD (post-traumatic stress disorder)     Past Surgical History  Procedure Laterality Date  . Knee surgery    . Total hip arthroplasty Right 01/20/2016    Procedure: RIGHT TOTAL HIP ARTHROPLASTY ANTERIOR APPROACH;  Surgeon: Kathryne Hitch, MD;  Location: [facility] OR;  Service: Orthopedics;  Laterality: Right;    VITAL SIGNS BP 124/70 mmHg  Pulse 80  Temp(Src) 98.2 F (36.8 C) (Oral)  Resp 18  Ht 5\' 8"  (1.727 m)  Wt 172 lb (78.019 kg)  BMI 26.16 kg/m2  Patient's Medications  New Prescriptions   No medications on file  Previous Medications   ALENDRONATE (FOSAMAX) 70 MG TABLET    Take 1 tablet (70 mg total) by mouth every 7 (seven) days. Take with a full glass of water on an empty stomach.   ASPIRIN EC 325 MG EC TABLET    Take 1 tablet (325 mg total) by mouth 2 (two) times daily after a meal.   CALCIUM CARBONATE (TUMS) 500 MG CHEWABLE TABLET    Chew 1 tablet (200 mg of elemental calcium total) by mouth 3 (three) times daily with meals.   CHOLECALCIFEROL (VITAMIN D) 2000 UNITS CAPS    Take 1 capsule (2,000 Units total) by mouth daily.   FERROUS SULFATE 325 (65 FE) MG TABLET    Take 1 tablet (325 mg total) by mouth 2 (two) times daily with a meal.   MENTHOL, TOPICAL ANALGESIC,  (BIOFREEZE) 4 % GEL    Apply 1 application topically 2 (two) times daily. AND twice daily as needed   METHOCARBAMOL (ROBAXIN) 500 MG TABLET    Take 1 tablet (500 mg total) by mouth every 6 (six) hours as needed for muscle spasms.   OXYCODONE-ACETAMINOPHEN (PERCOCET/ROXICET) 5-325 MG TABLET    Take 1-2 tablets by mouth every 4 (four) hours as needed for severe pain.   POLYETHYLENE GLYCOL (MIRALAX / GLYCOLAX) PACKET    Take 17 g by mouth daily.  Modified Medications   No medications on file  Discontinued Medications   No medications on file     SIGNIFICANT DIAGNOSTIC EXAMS  01-09-16: pelvic x-ray: Avascular necrosis of the right femoral head with cortical collapse. Distal femoral bone infarct.  Proximal tibial cortical irregularity, likely chronic. Advanced osteopenia.  No definite acute fracture.  01-09-16: right femur x-ray: Avascular necrosis of the right femoral head with cortical collapse. Distal femoral bone infarct. Proximal tibial cortical irregularity, likely chronic. Advanced osteopenia.  No definite acute fracture.  01-09-16: right hip x-ray: Avascular necrosis of the right femoral head with cortical collapse. Distal femoral bone infarct. Proximal tibial cortical irregularity, likely chronic.  Advanced osteopenia.  No definite acute fracture    LABS REVIEWED:   01-13-16: wbc 6.0; hgb 13.3; hct 39.6; mcv 95.2; plt 233; glucose 110; bun 6; creat 0.74; k+ 3.9; na++132 01-21-16: wbc 6.0; hgb 9.3; hct 27.7; mcv 96.2; plt 176; glucose 149; bun 5; creat 0.76; k+ 4.2; na++134 01-22-16: wbc 7.7; hb 9.2; hct 28.8; mcv 97.3; plt 188    Review of Systems  Constitutional: Negative for malaise/fatigue.  Respiratory: Negative for cough and shortness of breath.   Cardiovascular: Negative for chest pain, palpitations and leg swelling.  Gastrointestinal: negative for constipation. Negative for heartburn and abdominal pain.  Musculoskeletal:. Negative for myalgias. No joint pain  Skin:       Has  right hip incision line   Psychiatric/Behavioral: Negative for depression. The patient is not nervous/anxious.     Physical Exam  Constitutional: He is oriented to person, place, and time. He appears well-developed and well-nourished. No distress.  Eyes: Conjunctivae are normal.  Neck: Neck supple. No JVD present. No thyromegaly present.  Cardiovascular: Normal rate, regular rhythm and intact distal pulses.   Respiratory: Effort normal and breath sounds normal. No respiratory distress. He has no wheezes.  GI: Soft. Bowel sounds are normal. He exhibits no distension. There is no tenderness.  Musculoskeletal: He exhibits no edema.  Able to move all extremities  Is status post right hip replacement Lymphadenopathy:    He has no cervical adenopathy.  Neurological: He is alert and oriented to person, place, and time.  Skin: Skin is warm and dry. He is not diaphoretic.  Incision line without signs of infection present   Psychiatric: He has a normal mood and affect.     ASSESSMENT/ PLAN:   1. Avascular necrosis of right hip status post right hip replacement: will continue therapy as directed and will follow up with orthopedics as indicated. Will continue  oxycodone 10 mg every 4 hours as needed for pain management to lower tylenol intake; will continue robaxin 500 mg every 6 hours as needed will monitor will continue asa 325 mg twice daily   2. Hypertension: is stable is presently not on medications will monitor his status.   3. Anemia: acute blood loss from his hip surgery: will continue iron twice daily for total of 60 days  4. Constipation: will continue miralax daily  5. Osteoporosis: will continue fosamax 70 mg weekly; tums 500 mg three times daily and vit d 2000 units daily        Synthia Innocent NP [facility]  Contact [phone] Monday through Friday 8am- 5pm  After hours call [phone]

## 2016-02-23 ENCOUNTER — Encounter: Payer: Self-pay | Admitting: Adult Health

## 2016-02-23 ENCOUNTER — Non-Acute Institutional Stay (SKILLED_NURSING_FACILITY): Payer: Medicaid Other | Admitting: Adult Health

## 2016-02-23 DIAGNOSIS — Z96649 Presence of unspecified artificial hip joint: Secondary | ICD-10-CM | POA: Diagnosis not present

## 2016-02-23 DIAGNOSIS — M87051 Idiopathic aseptic necrosis of right femur: Secondary | ICD-10-CM

## 2016-02-23 DIAGNOSIS — Z96641 Presence of right artificial hip joint: Secondary | ICD-10-CM

## 2016-02-23 DIAGNOSIS — K5903 Drug induced constipation: Secondary | ICD-10-CM

## 2016-02-23 NOTE — Progress Notes (Signed)
Patient ID: Sean Mccoy, male   DOB: 05-24-1961, 55 y.o.   MRN: [id number]   Facility[facility]       No Known Allergies  Chief Complaint  Patient presents with  . Discharge Note    Discharge from facility    HPI:  He is being discharged to home. He will not require any home and will not need dme. He is ambulating throughout the facility independently. He will need his prescriptions to be written and will need to follow up with his pcp. He had been hospitalized for a right knee replacement.   Past Medical History  Diagnosis Date  . Hypertension   . Arthritis   . Knee fracture   . Groin injury   . Ankle injury   . Avascular necrosis (HCC)   . Anxiety   . Depression   . PTSD (post-traumatic stress disorder)     Past Surgical History  Procedure Laterality Date  . Knee surgery    . Total hip arthroplasty Right 01/20/2016    Procedure: RIGHT TOTAL HIP ARTHROPLASTY ANTERIOR APPROACH;  Surgeon: Kathryne Hitch, MD;  Location: [facility] OR;  Service: Orthopedics;  Laterality: Right;    VITAL SIGNS BP 124/70 mmHg  Pulse 80  Temp(Src) 98.2 F (36.8 C) (Oral)  Resp 18  Ht 5\' 8"  (1.727 m)  Wt 172 lb (78.019 kg)  BMI 26.16 kg/m2  Patient's Medications  New Prescriptions   No medications on file  Previous Medications   ALENDRONATE (FOSAMAX) 70 MG TABLET    Take 1 tablet (70 mg total) by mouth every 7 (seven) days. Take with a full glass of water on an empty stomach.   ASPIRIN EC 325 MG EC TABLET    Take 1 tablet (325 mg total) by mouth 2 (two) times daily after a meal.   CALCIUM CARBONATE (TUMS) 500 MG CHEWABLE TABLET    Chew 1 tablet (200 mg of elemental calcium total) by mouth 3 (three) times daily with meals.   CHOLECALCIFEROL (VITAMIN D) 2000 UNITS CAPS    Take 1 capsule (2,000 Units total) by mouth daily.   FERROUS SULFATE 325 (65 FE) MG TABLET    Take 1 tablet (325 mg total) by mouth 2 (two) times daily with a meal.   MENTHOL, TOPICAL ANALGESIC, (BIOFREEZE) 4 %  GEL    Apply 1 application topically 2 (two) times daily. AND twice daily as needed   METHOCARBAMOL (ROBAXIN) 500 MG TABLET    Take 500 mg by mouth every 8 (eight) hours as needed for muscle spasms.   Oxycodone 5 mg     Take 1-2 tablets by mouth every 4 (four) hours as needed for severe pain.   POLYETHYLENE GLYCOL (MIRALAX / GLYCOLAX) PACKET    Take 17 g by mouth daily.  Modified Medications   No medications on file  Discontinued Medications     SIGNIFICANT DIAGNOSTIC EXAMS  01-09-16: pelvic x-ray: Avascular necrosis of the right femoral head with cortical collapse. Distal femoral bone infarct.  Proximal tibial cortical irregularity, likely chronic. Advanced osteopenia.  No definite acute fracture.  01-09-16: right femur x-ray: Avascular necrosis of the right femoral head with cortical collapse. Distal femoral bone infarct. Proximal tibial cortical irregularity, likely chronic. Advanced osteopenia.  No definite acute fracture.  01-09-16: right hip x-ray: Avascular necrosis of the right femoral head with cortical collapse. Distal femoral bone infarct. Proximal tibial cortical irregularity, likely chronic. Advanced osteopenia.  No definite acute fracture    LABS REVIEWED:  01-13-16: wbc 6.0; hgb 13.3; hct 39.6; mcv 95.2; plt 233; glucose 110; bun 6; creat 0.74; k+ 3.9; na++132 01-21-16: wbc 6.0; hgb 9.3; hct 27.7; mcv 96.2; plt 176; glucose 149; bun 5; creat 0.76; k+ 4.2; na++134 01-22-16: wbc 7.7; hb 9.2; hct 28.8; mcv 97.3; plt 188    Review of Systems  Constitutional: Negative for malaise/fatigue.  Respiratory: Negative for cough and shortness of breath.   Cardiovascular: Negative for chest pain, palpitations and leg swelling.  Gastrointestinal: negative for constipation. Negative for heartburn and abdominal pain.  Musculoskeletal:. Negative for myalgias. No joint pain  Skin: no concerns   Psychiatric/Behavioral: Negative for depression. The patient is not nervous/anxious.      Physical Exam  Constitutional: He is oriented to person, place, and time. He appears well-developed and well-nourished. No distress.  Eyes: Conjunctivae are normal.  Neck: Neck supple. No JVD present. No thyromegaly present.  Cardiovascular: Normal rate, regular rhythm and intact distal pulses.   Respiratory: Effort normal and breath sounds normal. No respiratory distress. He has no wheezes.  GI: Soft. Bowel sounds are normal. He exhibits no distension. There is no tenderness.  Musculoskeletal: He exhibits no edema.  Able to move all extremities  Is status post right hip replacement Lymphadenopathy:    He has no cervical adenopathy.  Neurological: He is alert and oriented to person, place, and time.  Skin: Skin is warm and dry. He is not diaphoretic.   Psychiatric: He has a normal mood and affect.     ASSESSMENT/ PLAN:  Will discharge to home. He will not need home health and will not need any dme. His prescriptions have been written for a 30 day supply of his medications with #10 oxycodone 5 mg tabs. He has a follow up with Roxana Hires NP on 03-02-16 at 3:30 pm.    Time spent with patient 40  minutes >50% time spent counseling; reviewing medical record; tests; labs; and developing future plan of care    Synthia Innocent NP [facility]  Contact [phone] Monday through Friday 8am- 5pm  After hours call [phone]

## 2016-02-25 MED FILL — oxyCODONE HCL 5 MG TABS: 5 | 2 days supply | Qty: 10 | Fill #0

## 2016-03-02 ENCOUNTER — Ambulatory Visit: Payer: Self-pay | Admitting: Internal Medicine

## 2016-04-16 ENCOUNTER — Emergency Department (HOSPITAL_COMMUNITY): Payer: Medicaid Other

## 2016-04-16 ENCOUNTER — Encounter (HOSPITAL_COMMUNITY): Payer: Self-pay | Admitting: Emergency Medicine

## 2016-04-16 ENCOUNTER — Emergency Department (HOSPITAL_COMMUNITY)
Admission: EM | Admit: 2016-04-16 | Discharge: 2016-04-16 | Disposition: A | Discharge status: Home / Self Care | Payer: Medicaid Other | Attending: Emergency Medicine | Admitting: Emergency Medicine

## 2016-04-16 DIAGNOSIS — Z7982 Long term (current) use of aspirin: Secondary | ICD-10-CM | POA: Diagnosis not present

## 2016-04-16 DIAGNOSIS — G8929 Other chronic pain: Secondary | ICD-10-CM | POA: Diagnosis not present

## 2016-04-16 DIAGNOSIS — Z8781 Personal history of (healed) traumatic fracture: Secondary | ICD-10-CM | POA: Insufficient documentation

## 2016-04-16 DIAGNOSIS — X500XXA Overexertion from strenuous movement or load, initial encounter: Secondary | ICD-10-CM | POA: Diagnosis not present

## 2016-04-16 DIAGNOSIS — Y9289 Other specified places as the place of occurrence of the external cause: Secondary | ICD-10-CM | POA: Insufficient documentation

## 2016-04-16 DIAGNOSIS — Z96641 Presence of right artificial hip joint: Secondary | ICD-10-CM | POA: Insufficient documentation

## 2016-04-16 DIAGNOSIS — Z79899 Other long term (current) drug therapy: Secondary | ICD-10-CM | POA: Insufficient documentation

## 2016-04-16 DIAGNOSIS — M25561 Pain in right knee: Secondary | ICD-10-CM

## 2016-04-16 DIAGNOSIS — Y9389 Activity, other specified: Secondary | ICD-10-CM | POA: Insufficient documentation

## 2016-04-16 DIAGNOSIS — M1990 Unspecified osteoarthritis, unspecified site: Secondary | ICD-10-CM | POA: Insufficient documentation

## 2016-04-16 DIAGNOSIS — Y990 Civilian activity done for income or pay: Secondary | ICD-10-CM | POA: Insufficient documentation

## 2016-04-16 DIAGNOSIS — M25551 Pain in right hip: Secondary | ICD-10-CM | POA: Diagnosis not present

## 2016-04-16 DIAGNOSIS — Z8659 Personal history of other mental and behavioral disorders: Secondary | ICD-10-CM | POA: Insufficient documentation

## 2016-04-16 DIAGNOSIS — S8991XA Unspecified injury of right lower leg, initial encounter: Secondary | ICD-10-CM | POA: Insufficient documentation

## 2016-04-16 DIAGNOSIS — Z9889 Other specified postprocedural states: Secondary | ICD-10-CM | POA: Insufficient documentation

## 2016-04-16 DIAGNOSIS — I10 Essential (primary) hypertension: Secondary | ICD-10-CM | POA: Insufficient documentation

## 2016-04-16 MED ORDER — NAPROXEN 375 MG PO TABS: 375.0000 mg | ORAL_TABLET | Freq: Two times a day (BID) | ORAL | Status: DC

## 2016-04-16 MED ORDER — NAPROXEN 250 MG PO TABS
500.0000 mg | ORAL_TABLET | Freq: Once | ORAL | Status: AC
  Administered 2016-04-16: 500 mg via ORAL
  Filled 2016-04-16: qty 2

## 2016-04-16 NOTE — ED Provider Notes (Signed)
CSN: [id number]     Arrival date & time 04/16/16  1757 History  By signing my name below, I, Linus Galas, attest that this documentation has been prepared under the direction and in the presence of [name], PA-C. Electronically Signed: Linus Galas, ED Scribe. 04/16/2016. 7:08 PM.   Chief Complaint  Patient presents with  . Knee Pain  . Hip Pain   The history is provided by the patient. No language interpreter was used.   HPI Comments: Sean Mccoy is a 55 y.o. male who presents to the Emergency Department complaining of right knee pain that began today. Pt also reports chronic right hip pain that has recently increased in severity. Pt rates his pain 9/10 in severity. His pain is worse with movement and better with rest. Pt states as he was at work today when he was lifting something and heard something pop in his knee. No fall. He reports a previous injury to that knee many years ago. Pt has a hip replacement surgery in January 2017 by Dr. Magnus Ivan. Pt has not tried any OTC medications for pain. Pt denies any numbness, weakness, or tingling.  Pt is has been without work for a long time. He recently started a new job. Pt states that he is under a lot of stress. He reports that he must work to pay child support despite "not being ready to work" after his hip surgery.   Past Medical History  Diagnosis Date  . Hypertension   . Arthritis   . Knee fracture   . Groin injury   . Ankle injury   . Avascular necrosis (HCC)   . Anxiety   . Depression   . PTSD (post-traumatic stress disorder)    Past Surgical History  Procedure Laterality Date  . Knee surgery    . Total hip arthroplasty Right 01/20/2016    Procedure: RIGHT TOTAL HIP ARTHROPLASTY ANTERIOR APPROACH;  Surgeon: Kathryne Hitch, MD;  Location: [facility] OR;  Service: Orthopedics;  Laterality: Right;   Family History  Problem Relation Age of Onset  . Diabetes Mother   . Hypertension Mother   . Diabetes Father   .  Hypertension Father    Social History  Substance Use Topics  . Smoking status: Never Smoker   . Smokeless tobacco: Never Used  . Alcohol Use: 1.2 oz/week    2 Cans of beer per week     Comment: occ    Review of Systems A complete 10 system review of systems was obtained and all systems are negative except as noted in the HPI and PMH.   Allergies  Review of patient's allergies indicates no known allergies.  Home Medications   Prior to Admission medications   Medication Sig Start Date End Date Taking? Authorizing Provider  alendronate (FOSAMAX) 70 MG tablet Take 1 tablet (70 mg total) by mouth every 7 (seven) days. Take with a full glass of water on an empty stomach. 02/17/16   [name], NP  aspirin EC 325 MG EC tablet Take 1 tablet (325 mg total) by mouth 2 (two) times daily after a meal. 01/23/16   Kathryne Hitch, MD  calcium carbonate (TUMS) 500 MG chewable tablet Chew 1 tablet (200 mg of elemental calcium total) by mouth 3 (three) times daily with meals. 02/17/16   [name], NP  Cholecalciferol (VITAMIN D) 2000 units CAPS Take 1 capsule (2,000 Units total) by mouth daily. 02/17/16   [name], NP  ferrous sulfate 325 (  65 FE) MG tablet Take 1 tablet (325 mg total) by mouth 2 (two) times daily with a meal. 01/26/16   [name], NP  Menthol, Topical Analgesic, (BIOFREEZE) 4 % GEL Apply 1 application topically 2 (two) times daily. AND twice daily as needed 02/17/16   [name], NP  methocarbamol (ROBAXIN) 500 MG tablet Take 500 mg by mouth every 8 (eight) hours as needed for muscle spasms.    Historical Provider, MD  oxyCODONE-acetaminophen (PERCOCET/ROXICET) 5-325 MG tablet Take 1-2 tablets by mouth every 4 (four) hours as needed for severe pain. Patient taking differently: Take 2 tablets by mouth every 4 (four) hours as needed for severe pain.  01/23/16   Kathryne Hitch, MD  polyethylene glycol [facility] / Ethelene Hal) packet Take 17 g by mouth  daily.    Historical Provider, MD   BP 153/85 mmHg  Pulse 92  Temp(Src) 97.9 F (36.6 C) (Oral)  Resp 20  SpO2 100%   Physical Exam  Constitutional: He is oriented to person, place, and time. He appears well-developed and well-nourished.  HENT:  Head: Normocephalic and atraumatic.  Eyes: EOM are normal.  Neck: Normal range of motion.  Cardiovascular: Normal rate and regular rhythm.   Pulmonary/Chest: Effort normal.  Abdominal: Soft. He exhibits no distension.  Musculoskeletal:       Right knee: He exhibits decreased range of motion. He exhibits no swelling, no ecchymosis, no deformity, no laceration, no bony tenderness, normal meniscus and no MCL laxity. No tenderness found. No medial joint line, no lateral joint line, no MCL, no LCL and no patellar tendon tenderness noted.  Negative anterior/poster drawer bilaterally. Negative ballottement test. No varus or valgus laxity. No crepitus. Has pain with flexion and extension. Positive McMurray. No TTP of knees.   Neurological: He is alert and oriented to person, place, and time.  Skin: Skin is warm and dry.  Psychiatric: He has a normal mood and affect. His behavior is normal. Thought content normal.  Nursing note and vitals reviewed.  ED Course  Procedures  DIAGNOSTIC STUDIES: Oxygen Saturation is 100% on room air, normal by my interpretation.    COORDINATION OF CARE: 7:02 PMWill order complete right knee x-ray and right hip x-ray. Discussed treatment plan with pt at bedside and pt agreed to plan.  Labs Review Labs Reviewed - No data to display  Imaging Review No results found. I have personally reviewed and evaluated these images and lab results as part of my medical decision-making.   EKG Interpretation None      MDM   I have reviewed and evaluated the relevant imaging studies. I have reviewed the relevant previous healthcare records. I obtained HPI from historian.  ED Course:  Assessment: Pt is a 55yM with hx right  hip replacement who presents with right knee pain since today.  No fall. States he felt a "pop" while lifting object. On exam, pt in NAD. Nontoxic/nonseptic appearing. VSS. Afebrile. Positive McMurray. Suspect meniscal injury. XR Knee unremarkable. XR Hip unchanged from previous imaging. Plan is to DC home with follow up to Dr. Magnus Ivan for further evaluation and management. At time of discharge, Patient is in no acute distress. Vital Signs are stable. Patient is able to ambulate. Patient able to tolerate PO.    Disposition/Plan:  DC Home Additional Verbal discharge instructions given and discussed with patient.  Pt Instructed to f/u with Orthopedics for evaluation and treatment of symptoms. Return precautions given Pt acknowledges and agrees with plan  Supervising  Physician Loren Racer, MD   Final diagnoses:  Right knee pain  Right hip pain   I personally performed the services described in this documentation, which was scribed in my presence. The recorded information has been reviewed and is accurate.   [name], PA-C 04/16/16 2008  Loren Racer, MD 04/17/16 2351

## 2016-04-16 NOTE — Discharge Instructions (Signed)
Please read and follow all provided instructions.  Your diagnoses today include:  1. Right knee pain    Tests performed today include:  Vital signs. See below for your results today.   Medications prescribed:   None  Home care instructions:  Follow any educational materials contained in this packet.  Follow-up instructions: Please follow-up with Orthopedics for further evaluation of symptoms and treatment   Return instructions:   Please return to the Emergency Department if you do not get better, if you get worse, or new symptoms OR  - Fever (temperature greater than 101.11F)  - Bleeding that does not stop with holding pressure to the area    -Severe pain (please note that you may be more sore the day after your accident)  - Chest Pain  - Difficulty breathing  - Severe nausea or vomiting  - Inability to tolerate food and liquids  - Passing out  - Skin becoming red around your wounds  - Change in mental status (confusion or lethargy)  - New numbness or weakness     Please return if you have any other emergent concerns.  Additional Information:  Your vital signs today were: BP 153/85 mmHg   Pulse 92   Temp(Src) 97.9 F (36.6 C) (Oral)   Resp 20   SpO2 100% If your blood pressure (BP) was elevated above 135/85 this visit, please have this repeated by your doctor within one month. ---------------

## 2016-04-16 NOTE — ED Notes (Signed)
Pt states he had right hip replacement on January 24th.  Stated he heard something "pop" today at work.

## 2016-04-16 NOTE — ED Notes (Signed)
Pt. fell today " knee gave out" , reports pain at right knee and right hip , denies LOC / ambulatory , pain increases with movement .

## 2016-04-16 NOTE — ED Notes (Signed)
Pt ambulatory and independent at discharge.  

## 2016-04-16 NOTE — ED Notes (Signed)
Patient transported to X-ray 

## 2016-05-08 ENCOUNTER — Emergency Department (HOSPITAL_COMMUNITY): Payer: Medicaid Other

## 2016-05-08 ENCOUNTER — Encounter (HOSPITAL_COMMUNITY): Payer: Self-pay | Admitting: Emergency Medicine

## 2016-05-08 ENCOUNTER — Emergency Department (HOSPITAL_COMMUNITY)
Admission: EM | Admit: 2016-05-08 | Discharge: 2016-05-08 | Disposition: A | Discharge status: Home / Self Care | Payer: Medicaid Other | Attending: Emergency Medicine | Admitting: Emergency Medicine

## 2016-05-08 DIAGNOSIS — Z87828 Personal history of other (healed) physical injury and trauma: Secondary | ICD-10-CM | POA: Insufficient documentation

## 2016-05-08 DIAGNOSIS — F329 Major depressive disorder, single episode, unspecified: Secondary | ICD-10-CM | POA: Diagnosis not present

## 2016-05-08 DIAGNOSIS — Z7982 Long term (current) use of aspirin: Secondary | ICD-10-CM | POA: Diagnosis not present

## 2016-05-08 DIAGNOSIS — F1010 Alcohol abuse, uncomplicated: Secondary | ICD-10-CM | POA: Diagnosis not present

## 2016-05-08 DIAGNOSIS — I10 Essential (primary) hypertension: Secondary | ICD-10-CM | POA: Insufficient documentation

## 2016-05-08 DIAGNOSIS — F419 Anxiety disorder, unspecified: Secondary | ICD-10-CM | POA: Insufficient documentation

## 2016-05-08 DIAGNOSIS — F1090 Alcohol use, unspecified, uncomplicated: Secondary | ICD-10-CM

## 2016-05-08 DIAGNOSIS — Z8781 Personal history of (healed) traumatic fracture: Secondary | ICD-10-CM | POA: Diagnosis not present

## 2016-05-08 DIAGNOSIS — Z791 Long term (current) use of non-steroidal anti-inflammatories (NSAID): Secondary | ICD-10-CM | POA: Diagnosis not present

## 2016-05-08 DIAGNOSIS — Z7289 Other problems related to lifestyle: Secondary | ICD-10-CM

## 2016-05-08 DIAGNOSIS — Z79899 Other long term (current) drug therapy: Secondary | ICD-10-CM | POA: Insufficient documentation

## 2016-05-08 DIAGNOSIS — R0602 Shortness of breath: Secondary | ICD-10-CM | POA: Diagnosis present

## 2016-05-08 LAB — BASIC METABOLIC PANEL
Anion gap: 15 (ref 5–15)
BUN: 16 mg/dL (ref 6–20)
CALCIUM: 8.8 mg/dL — AB (ref 8.9–10.3)
CO2: 20 mmol/L — ABNORMAL LOW (ref 22–32)
CREATININE: 0.84 mg/dL (ref 0.61–1.24)
Chloride: 108 mmol/L (ref 101–111)
GFR calc non Af Amer: >60 mL/min (ref >60)
Glucose, Bld: 104 mg/dL — ABNORMAL HIGH (ref 65–99)
Potassium: 4 mmol/L (ref 3.5–5.1)
SODIUM: 143 mmol/L (ref 135–145)

## 2016-05-08 LAB — CBC
HEMATOCRIT: 38.4 % — AB (ref 39.0–52.0)
HEMOGLOBIN: 12.4 g/dL — AB (ref 13.0–17.0)
MCH: 28.6 pg (ref 26.0–34.0)
MCHC: 32.3 g/dL (ref 30.0–36.0)
MCV: 88.5 fL (ref 78.0–100.0)
PLATELETS: 277 10*3/uL (ref 150–400)
RBC: 4.34 MIL/uL (ref 4.22–5.81)
RDW: 17 % — ABNORMAL HIGH (ref 11.5–15.5)
WBC: 5.9 10*3/uL (ref 4.0–10.5)

## 2016-05-08 LAB — I-STAT TROPONIN, ED: Troponin i, poc: 0 ng/mL (ref 0.00–0.08)

## 2016-05-08 MED ORDER — IBUPROFEN 200 MG PO TABS
400.0000 mg | ORAL_TABLET | Freq: Once | ORAL | Status: AC
  Administered 2016-05-08: 400 mg via ORAL
  Filled 2016-05-08: qty 2
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
## 2016-05-08 NOTE — ED Notes (Signed)
Pt verbalized understanding of d/c instructions and follow-up care. No further questions/concerns, VSS, assisted to lobby in wheelchair.  Pt asking this RN about his glasses. This RN did not see any glasses in room or on pt. Explained this to pt.
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
## 2016-05-08 NOTE — Discharge Instructions (Signed)
Self-Destructive Behavior Self-destructive behavior includes attitudes and behaviors that can harm, injure, or be destructive to the person who has or performs them. Self-destructive behavior is often used as a coping strategy to deal with painful emotions and stress. It is often habit-forming and difficult to control without help. Self-destructive behavior can result in serious injury or death.  EXAMPLES OF SELF-DESTRUCTIVE BEHAVIOR   Hurting yourself (self-injury). This includes cutting, scratching, burning, or otherwise injuring yourself to release tension or lessen emotional pain.  Binge eating and other eating disorders.  Excessive drinking.  Drug abuse.  Shopping sprees, reckless spending, or gambling that causes you to go into debt.  Any type of addictive behavior. This includes gambling, shoplifting, and other behaviors.  Not setting healthy limits. This may include depriving yourself of proper rest and nutrition in order to meet the demands of others.  Intense or chronic feelings of anxiety, anger, impulsiveness, unworthiness, hopelessness, or loss. WHAT CAUSES SELF-DESTRUCTIVE BEHAVIOR? The underlying causes of self-destructive behavior are personal and may include:  Difficulty managing stress.  Trauma or abuse (including in past life events).  Other mental health issues, such as clinical depression and low self-esteem. WHAT SHOULD I DO IF I WANT TO HURT MYSELF?   See your health care provider or counselor. He or she will help you recognize the source of your problems, sort out your feelings, and get the help you need.  Avoid alcohol and drugs.  Try distracting yourself with other activities (such as chewing gum, exercising, cleaning, or snapping rubber bands) until you are calm and can seek help.  Learn how to deal with stress in healthy, positive ways (such as with relaxation techniques or exercise).  Learn to identify and avoid the things that trigger your  self-destructive behavior.  Get involved in a local support group. SEEK MEDICAL CARE IF:   You are experiencing suicidal thoughts.  You experience illness or injury as a result of self-destructive behavior. SEEK IMMEDIATE MEDICAL CARE IF:  Your behavior is out of control and your life is in danger. Call:  The police.   Your health care provider.  Local emergency services (911 in [place] MAKE SURE YOU:  Understand these instructions.  Will watch your condition.  Will get help right away if you are not doing well or get worse.   This information is not intended to replace advice given to you by your health care provider. Make sure you discuss any questions you have with your health care provider.   Document Released: 01/20/2005 Document Revised: 08/15/2013 Document Reviewed: 05/01/2013 Elsevier Interactive Patient Education [employer]

## 2016-05-11 ENCOUNTER — Emergency Department (HOSPITAL_COMMUNITY)
Admission: EM | Admit: 2016-05-11 | Discharge: 2016-05-11 | Disposition: A | Discharge status: Home / Self Care | Payer: Medicaid Other | Attending: Emergency Medicine | Admitting: Emergency Medicine

## 2016-05-11 ENCOUNTER — Encounter (HOSPITAL_COMMUNITY): Payer: Self-pay | Admitting: Nurse Practitioner

## 2016-05-11 ENCOUNTER — Other Ambulatory Visit: Payer: Self-pay

## 2016-05-11 DIAGNOSIS — G8929 Other chronic pain: Secondary | ICD-10-CM | POA: Diagnosis not present

## 2016-05-11 DIAGNOSIS — F1193 Opioid use, unspecified with withdrawal: Secondary | ICD-10-CM

## 2016-05-11 DIAGNOSIS — Z87828 Personal history of other (healed) physical injury and trauma: Secondary | ICD-10-CM | POA: Diagnosis not present

## 2016-05-11 DIAGNOSIS — Z7982 Long term (current) use of aspirin: Secondary | ICD-10-CM | POA: Diagnosis not present

## 2016-05-11 DIAGNOSIS — R079 Chest pain, unspecified: Secondary | ICD-10-CM | POA: Insufficient documentation

## 2016-05-11 DIAGNOSIS — R197 Diarrhea, unspecified: Secondary | ICD-10-CM | POA: Insufficient documentation

## 2016-05-11 DIAGNOSIS — M1990 Unspecified osteoarthritis, unspecified site: Secondary | ICD-10-CM | POA: Insufficient documentation

## 2016-05-11 DIAGNOSIS — Z79899 Other long term (current) drug therapy: Secondary | ICD-10-CM | POA: Diagnosis not present

## 2016-05-11 DIAGNOSIS — Z791 Long term (current) use of non-steroidal anti-inflammatories (NSAID): Secondary | ICD-10-CM | POA: Diagnosis not present

## 2016-05-11 DIAGNOSIS — F1123 Opioid dependence with withdrawal: Secondary | ICD-10-CM | POA: Insufficient documentation

## 2016-05-11 DIAGNOSIS — F419 Anxiety disorder, unspecified: Secondary | ICD-10-CM | POA: Diagnosis not present

## 2016-05-11 DIAGNOSIS — I10 Essential (primary) hypertension: Secondary | ICD-10-CM | POA: Diagnosis not present

## 2016-05-11 DIAGNOSIS — Z8781 Personal history of (healed) traumatic fracture: Secondary | ICD-10-CM | POA: Insufficient documentation

## 2016-05-11 LAB — COMPREHENSIVE METABOLIC PANEL
ALK PHOS: 107 U/L (ref 38–126)
ALT: 12 U/L — AB (ref 17–63)
AST: 27 U/L (ref 15–41)
Albumin: 4 g/dL (ref 3.5–5.0)
Anion gap: 11 (ref 5–15)
BUN: 8 mg/dL (ref 6–20)
CALCIUM: 9.2 mg/dL (ref 8.9–10.3)
CHLORIDE: 101 mmol/L (ref 101–111)
CO2: 24 mmol/L (ref 22–32)
CREATININE: 0.58 mg/dL — AB (ref 0.61–1.24)
GFR calc Af Amer: >60 mL/min (ref >60)
GFR calc non Af Amer: >60 mL/min (ref >60)
Glucose, Bld: 166 mg/dL — ABNORMAL HIGH (ref 65–99)
Potassium: 4.2 mmol/L (ref 3.5–5.1)
SODIUM: 136 mmol/L (ref 135–145)
Total Bilirubin: 0.7 mg/dL (ref 0.3–1.2)
Total Protein: 7.7 g/dL (ref 6.5–8.1)

## 2016-05-11 LAB — RAPID URINE DRUG SCREEN, HOSP PERFORMED
Amphetamines: NOT DETECTED
BARBITURATES: NOT DETECTED
BENZODIAZEPINES: NOT DETECTED
COCAINE: NOT DETECTED
Opiates: POSITIVE — AB
TETRAHYDROCANNABINOL: NOT DETECTED

## 2016-05-11 LAB — CBC
HEMATOCRIT: 37.8 % — AB (ref 39.0–52.0)
Hemoglobin: 12.7 g/dL — ABNORMAL LOW (ref 13.0–17.0)
MCH: 29.5 pg (ref 26.0–34.0)
MCHC: 33.6 g/dL (ref 30.0–36.0)
MCV: 87.9 fL (ref 78.0–100.0)
Platelets: 285 10*3/uL (ref 150–400)
RBC: 4.3 MIL/uL (ref 4.22–5.81)
RDW: 16.8 % — AB (ref 11.5–15.5)
WBC: 3.5 10*3/uL — ABNORMAL LOW (ref 4.0–10.5)

## 2016-05-11 LAB — ETHANOL: Alcohol, Ethyl (B): 205 mg/dL — ABNORMAL HIGH (ref <5)

## 2016-05-11 LAB — TROPONIN I: Troponin I: <0.03 ng/mL (ref <0.031)

## 2016-05-11 NOTE — Discharge Instructions (Signed)

## 2016-05-11 NOTE — ED Notes (Signed)
The pt c/o CP since last night and onset of panic attacks today. He reports onset of symptoms after he ran out of his oxycodone. He states he has been taking oxycodone daily since he had a hip replacement surgery in january. 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 He denies thoughts of harming himself or others. He denies using any other substances. He is alert and tearful, calm, cooperative.

## 2016-05-11 NOTE — ED Provider Notes (Signed)
CSN: [id number]     Arrival date & time 05/11/16  1608 History   First MD Initiated Contact with Patient 05/11/16 2017     Chief Complaint  Patient presents with  . Addiction Problem     Patient is a 55 y.o. male presenting with chest pain. The history is provided by the patient.  Chest Pain Pain location:  L chest Pain quality comment:  Throbbing Pain radiates to:  Does not radiate Pain severity:  Moderate Onset quality:  Gradual Duration:  1 day Timing:  Constant Progression:  Unchanged Chronicity:  New Relieved by:  None tried Worsened by:  Nothing tried Associated symptoms: nausea and vomiting   Associated symptoms: no fever and no shortness of breath    Patient presents with chest pain and also panic attacks due to possibly withdrawing from oxycodone He reports he that he has been on oxycodone by pain management for his chronic right hip pain He has not had any pain meds in past several days He began having nausea/vomiting and diarrhea He reports CP since yesterday and panic attacks No SOB reported No cough reported   He denies h/o CAD Past Medical History  Diagnosis Date  . Hypertension   . Arthritis   . Knee fracture   . Groin injury   . Ankle injury   . Avascular necrosis (HCC)   . Anxiety   . Depression   . PTSD (post-traumatic stress disorder)    Past Surgical History  Procedure Laterality Date  . Knee surgery    . Total hip arthroplasty Right 01/20/2016    Procedure: RIGHT TOTAL HIP ARTHROPLASTY ANTERIOR APPROACH;  Surgeon: Kathryne Hitch, MD;  [facility];  Service: Orthopedics;  Laterality: Right;   Family History  Problem Relation Age of Onset  . Diabetes Mother   . Hypertension Mother   . Diabetes Father   . Hypertension Father    Social History  Substance Use Topics  . Smoking status: Never Smoker   . Smokeless tobacco: Never Used  . Alcohol Use: 1.2 oz/week    2 Cans of beer per week     Comment: occ    Review of Systems   Constitutional: Negative for fever.  Respiratory: Negative for shortness of breath.   Cardiovascular: Positive for chest pain.  Gastrointestinal: Positive for nausea, vomiting and diarrhea.  Neurological: Negative for syncope.  Psychiatric/Behavioral: Negative for suicidal ideas. The patient is nervous/anxious.   All other systems reviewed and are negative.   Allergies  Review of patient's allergies indicates no known allergies.  Home Medications   Prior to Admission medications   Medication Sig Start Date End Date Taking? Authorizing Provider  alendronate (FOSAMAX) 70 MG tablet Take 1 tablet (70 mg total) by mouth every 7 (seven) days. Take with a full glass of water on an empty stomach. 02/17/16   [name], NP  aspirin EC 325 MG EC tablet Take 1 tablet (325 mg total) by mouth 2 (two) times daily after a meal. 01/23/16   Kathryne Hitch, MD  calcium carbonate (TUMS) 500 MG chewable tablet Chew 1 tablet (200 mg of elemental calcium total) by mouth 3 (three) times daily with meals. 02/17/16   [name], NP  Cholecalciferol (VITAMIN D) 2000 units CAPS Take 1 capsule (2,000 Units total) by mouth daily. 02/17/16   [name], NP  ferrous sulfate 325 (65 FE) MG tablet Take 1 tablet (325 mg total) by mouth 2 (two) times daily with  a meal. 01/26/16   [name], NP  Menthol, Topical Analgesic, (BIOFREEZE) 4 % GEL Apply 1 application topically 2 (two) times daily. AND twice daily as needed 02/17/16   [name], NP  methocarbamol (ROBAXIN) 500 MG tablet Take 500 mg by mouth every 8 (eight) hours as needed for muscle spasms.    Historical Provider, MD  naproxen (NAPROSYN) 375 MG tablet Take 1 tablet (375 mg total) by mouth 2 (two) times daily with a meal. 04/16/16   [name], PA-C  oxyCODONE-acetaminophen (PERCOCET/ROXICET) 5-325 MG tablet Take 1-2 tablets by mouth every 4 (four) hours as needed for severe pain. Patient taking differently: Take 2 tablets by mouth every  4 (four) hours as needed for severe pain.  01/23/16   Kathryne Hitch, MD  polyethylene glycol [facility] / Ethelene Hal) packet Take 17 g by mouth daily.    Historical Provider, MD   BP 121/80 mmHg  Pulse 88  Temp(Src) 98.3 F (36.8 C) (Oral)  Resp 21  SpO2 100% Physical Exam CONSTITUTIONAL: Well developed/well nourished, mildly anxious HEAD: Normocephalic/atraumatic EYES: EOMI/PERRL ENMT: Mucous membranes moist NECK: supple no meningeal signs SPINE/BACK:entire spine nontender CV: S1/S2 noted, no murmurs/rubs/gallops noted LUNGS: Lungs are clear to auscultation bilaterally, no apparent distress ABDOMEN: soft, nontender, no rebound or guarding, bowel sounds noted throughout abdomen GU:no cva tenderness NEURO: Pt is awake/alert/appropriate, moves all extremitiesx4.  No facial droop.   EXTREMITIES: pulses normal/equal, full ROM, no calf tenderness noted SKIN: warm, color normal PSYCH: anxious   ED Course  Procedures  Labs Review Labs Reviewed  COMPREHENSIVE METABOLIC PANEL - Abnormal; Notable for the following:    Glucose, Bld 166 (*)    Creatinine, Ser 0.58 (*)    ALT 12 (*)    All other components within normal limits  ETHANOL - Abnormal; Notable for the following:    Alcohol, Ethyl (B) 205 (*)    All other components within normal limits  CBC - Abnormal; Notable for the following:    WBC 3.5 (*)    Hemoglobin 12.7 (*)    HCT 37.8 (*)    RDW 16.8 (*)    All other components within normal limits  URINE RAPID DRUG SCREEN, HOSP PERFORMED  TROPONIN I    I have personally reviewed and evaluated these lab results as part of my medical decision-making.   EKG Interpretation   Date/Time:  Tuesday May 11 2016 21:07:09 EDT Ventricular Rate:  92 PR Interval:  175 QRS Duration: 85 QT Interval:  378 QTC Calculation: 468 R Axis:   54 Text Interpretation:  Sinus rhythm Consider left ventricular hypertrophy  No significant change since last tracing Confirmed by Bebe Shaggy   MD, Scotti Motter  [phone]) on 05/11/2016 9:15:47 PM     9:22 PM Pt is in the ED mostly for his concern for opiate withdrawal as he has been out of meds for several days and is not due narcotics until 5/23 I told him I would be unable to write new prescriptions He is otherwise stable HEART score 3 and my suspicion for ACS is low (mostly he is concerned about withdrawal and panic attacks) I doubt PE or dissection at this time He had CXR on 5/13 that was negative As for his opiate withdrawal, he is awake/alert, mentation appropriate and vitals appropriate 11:29 PM Pt stable He is sleeping will d/c home   MDM   Final diagnoses:  Chest pain, unspecified chest pain type  Opiate withdrawal [facility])    Nursing notes  including past medical history and social history reviewed and considered in documentation Previous records reviewed and considered Labs/vital reviewed myself and considered during evaluation     Zadie Rhine, MD 05/11/16 2329

## 2016-09-10 ENCOUNTER — Encounter (HOSPITAL_COMMUNITY): Payer: Self-pay | Admitting: *Deleted

## 2016-09-10 ENCOUNTER — Emergency Department (HOSPITAL_COMMUNITY)
Admission: EM | Admit: 2016-09-10 | Discharge: 2016-09-11 | Disposition: A | Discharge status: Home / Self Care | Payer: Medicaid Other | Attending: Emergency Medicine | Admitting: Emergency Medicine

## 2016-09-10 DIAGNOSIS — F1920 Other psychoactive substance dependence, uncomplicated: Secondary | ICD-10-CM | POA: Diagnosis present

## 2016-09-10 DIAGNOSIS — F4310 Post-traumatic stress disorder, unspecified: Secondary | ICD-10-CM | POA: Diagnosis present

## 2016-09-10 DIAGNOSIS — I10 Essential (primary) hypertension: Secondary | ICD-10-CM | POA: Insufficient documentation

## 2016-09-10 DIAGNOSIS — Z791 Long term (current) use of non-steroidal anti-inflammatories (NSAID): Secondary | ICD-10-CM | POA: Diagnosis not present

## 2016-09-10 DIAGNOSIS — F329 Major depressive disorder, single episode, unspecified: Secondary | ICD-10-CM | POA: Diagnosis not present

## 2016-09-10 DIAGNOSIS — F1910 Other psychoactive substance abuse, uncomplicated: Secondary | ICD-10-CM | POA: Diagnosis not present

## 2016-09-10 DIAGNOSIS — Z046 Encounter for general psychiatric examination, requested by authority: Secondary | ICD-10-CM | POA: Diagnosis present

## 2016-09-10 DIAGNOSIS — F32A Depression, unspecified: Secondary | ICD-10-CM

## 2016-09-10 DIAGNOSIS — Z79899 Other long term (current) drug therapy: Secondary | ICD-10-CM | POA: Insufficient documentation

## 2016-09-10 LAB — CBC WITH DIFFERENTIAL/PLATELET
BASOS ABS: 0 10*3/uL (ref 0.0–0.1)
Basophils Relative: 0 %
EOS PCT: 2 %
Eosinophils Absolute: 0.1 10*3/uL (ref 0.0–0.7)
HEMATOCRIT: 34.8 % — AB (ref 39.0–52.0)
Hemoglobin: 11.2 g/dL — ABNORMAL LOW (ref 13.0–17.0)
LYMPHS ABS: 2.2 10*3/uL (ref 0.7–4.0)
LYMPHS PCT: 57 %
MCH: 31.5 pg (ref 26.0–34.0)
MCHC: 32.2 g/dL (ref 30.0–36.0)
MCV: 98 fL (ref 78.0–100.0)
MONO ABS: 0.6 10*3/uL (ref 0.1–1.0)
Monocytes Relative: 15 %
NEUTROS ABS: 1 10*3/uL — AB (ref 1.7–7.7)
Neutrophils Relative %: 26 %
PLATELETS: 247 10*3/uL (ref 150–400)
RBC: 3.55 MIL/uL — AB (ref 4.22–5.81)
RDW: 13.6 % (ref 11.5–15.5)
WBC: 3.9 10*3/uL — AB (ref 4.0–10.5)

## 2016-09-10 NOTE — ED Provider Notes (Signed)
[facility]-EMERGENCY DEPT Provider Note   CSN: [id number] Arrival date & time: 09/10/16  2033  By signing my name below, I, [name], attest that this documentation has been prepared under the direction and in the presence of  [employer], [place]. Electronically Signed: [name], ED Scribe. 09/10/16. 10:14 PM.   History   Chief Complaint Chief Complaint  Patient presents with  . Psychiatric Evaluation   The history is provided by the patient and medical records. No language interpreter was used.    HPI Comments:  Sean Mccoy is a 55 y.o. male with a history of depression, anxiety and PTSD who presents to the Emergency Department for a psychiatric evaluation. He is part of a program called "[facility]" that requires a psychiatric evaluation. He notes feeling more depressed recently and reports he recently lost everything including his family photographs. He was on psychiatric medication and reports that it made his depression worse. He denies suicidal ideation and homicidal ideation.     Past Medical History:  Diagnosis Date  . Ankle injury   . Anxiety   . Arthritis   . Avascular necrosis (HCC)   . Depression   . Groin injury   . Hypertension   . Knee fracture   . PTSD (post-traumatic stress disorder)     Patient Active Problem List   Diagnosis Date Noted  . Constipation due to pain medication therapy 02/17/2016  . Postoperative anemia due to acute blood loss 02/17/2016  . Osteoporosis 02/17/2016  . Right knee pain 02/17/2016  . Avascular necrosis of bone of right hip (HCC) 01/20/2016  . 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 Depression 11/29/2012  . Anxiety 11/29/2012  . Post traumatic stress disorder 11/29/2012  . Substance induced mood disorder (HCC) 11/29/2012  . Alcohol abuse 11/29/2012    Past Surgical History:  Procedure Laterality Date  . KNEE SURGERY    . TOTAL HIP ARTHROPLASTY Right 01/20/2016   Procedure: RIGHT TOTAL  HIP ARTHROPLASTY ANTERIOR APPROACH;  Surgeon: Kathryne Hitch, MD;  Location: [facility] OR;  Service: Orthopedics;  Laterality: Right;      Home Medications    Prior to Admission medications   Medication Sig Start Date End Date Taking? Authorizing Provider  calcium carbonate (TUMS) 500 MG chewable tablet Chew 1 tablet (200 mg of elemental calcium total) by mouth 3 (three) times daily with meals. Patient taking differently: Chew 1 tablet by mouth 3 (three) times daily as needed for indigestion.  02/17/16  Yes [name], NP  Menthol, Topical Analgesic, (BIOFREEZE) 4 % GEL Apply 1 application topically 2 (two) times daily. AND twice daily as needed Patient taking differently: Apply 1 application topically 4 (four) times daily as needed. For pain. 02/17/16  Yes [name], NP  naproxen (NAPROSYN) 375 MG tablet Take 1 tablet (375 mg total) by mouth 2 (two) times daily with a meal. Patient taking differently: Take 375 mg by mouth 2 (two) times daily as needed. For pain. 04/16/16  Yes [name], PA-C  oxyCODONE-acetaminophen (PERCOCET/ROXICET) 5-325 MG tablet Take 1-2 tablets by mouth every 4 (four) hours as needed for severe pain. Patient taking differently: Take 2 tablets by mouth every 4 (four) hours as needed for severe pain.  01/23/16  Yes Kathryne Hitch, MD  polyethylene glycol [facility] Ethelene Hal) packet Take 17 g by mouth daily as needed (for constipation.).    Yes Historical Provider, MD  alendronate (FOSAMAX) 70 MG tablet Take 1 tablet (70  mg total) by mouth every 7 (seven) days. Take with a full glass of water on an empty stomach. Patient not taking: Reported on 09/11/2016 02/17/16   [name], NP  aspirin EC 325 MG EC tablet Take 1 tablet (325 mg total) by mouth 2 (two) times daily after a meal. 01/23/16   Kathryne Hitch, MD  Cholecalciferol (VITAMIN D) 2000 units CAPS Take 1 capsule (2,000 Units total) by mouth daily. Patient not taking: Reported on 09/11/2016  02/17/16   [name], NP  ferrous sulfate 325 (65 FE) MG tablet Take 1 tablet (325 mg total) by mouth 2 (two) times daily with a meal. Patient not taking: Reported on 09/11/2016 01/26/16   [name], NP    Family History Family History  Problem Relation Age of Onset  . Diabetes Mother   . Hypertension Mother   . Diabetes Father   . Hypertension Father     Social History Social History  Substance Use Topics  . Smoking status: Never Smoker  . Smokeless tobacco: Never Used  . Alcohol use 1.2 oz/week    2 Cans of beer per week     Comment: occ     Allergies   Review of patient's allergies indicates no known allergies.   Review of Systems Review of Systems 10 systems reviewed and all are negative for acute change except as noted in the HPI.   Physical Exam Updated Vital Signs BP 129/77 (BP Location: Right Arm)   Pulse 81   Temp 98.1 F (36.7 C) (Oral)   Resp 20   SpO2 98%   Physical Exam  Constitutional: He is oriented to person, place, and time. He appears well-developed and well-nourished. No distress.  HENT:  Head: Normocephalic and atraumatic.  Eyes: Conjunctivae and EOM are normal. No scleral icterus.  Neck: Normal range of motion.  Pulmonary/Chest: Effort normal. No respiratory distress.  Musculoskeletal: Normal range of motion.  Neurological: He is alert and oriented to person, place, and time.  Skin: Skin is warm and dry. No rash noted. He is not diaphoretic. No erythema. No pallor.  Psychiatric: His speech is normal and behavior is normal. He exhibits a depressed mood. He expresses no homicidal and no suicidal ideation.  Nursing note and vitals reviewed.    ED Treatments / Results   DIAGNOSTIC STUDIES:  Oxygen Saturation is 97% on RA, NML by my interpretation.    COORDINATION OF CARE:  10:14 PM  Discussed treatment plan with pt at bedside and pt agreed to plan.  Labs (all labs ordered are listed, but only abnormal results are  displayed) Labs Reviewed  CBC WITH DIFFERENTIAL/PLATELET - Abnormal; Notable for the following:       Result Value   WBC 3.9 (*)    RBC 3.55 (*)    Hemoglobin 11.2 (*)    HCT 34.8 (*)    Neutro Abs 1.0 (*)    All other components within normal limits  ETHANOL - Abnormal; Notable for the following:    Alcohol, Ethyl (B) 37 (*)    All other components within normal limits  URINE RAPID DRUG SCREEN, HOSP PERFORMED - Abnormal; Notable for the following:    Opiates POSITIVE (*)    Benzodiazepines POSITIVE (*)    All other components within normal limits  COMPREHENSIVE METABOLIC PANEL    EKG  EKG Interpretation None       Radiology No results found.  Procedures Procedures (including critical care time)  Medications Ordered  in ED Medications - No data to display   Initial Impression / Assessment and Plan / ED Course  I have reviewed the triage vital signs and the nursing notes.  Pertinent labs & imaging results that were available during my care of the patient were reviewed by me and considered in my medical decision making (see chart for details).  Clinical Course    Patient medically cleared. He is pending AM psychiatric evaluation, per TTS recommendations. Disposition to be determined by oncoming ED provider.   Final Clinical Impressions(s) / ED Diagnoses   Final diagnoses:  Depression  Polysubstance abuse    New Prescriptions New Prescriptions   No medications on file    I personally performed the services described in this documentation, which was scribed in my presence. The recorded information has been reviewed and is accurate.      Antony Madura, PA-C 09/11/16 [id number]    Rolland Porter, MD 09/14/16 1450
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
## 2016-09-10 NOTE — ED Triage Notes (Signed)
Pt states he would like to get a psychiatric evaluation and states he feels more depressed than usual. Pt states he is in a program called "[facility]" which asked him to get a psychiatric evaluation to continue with their program. Pt denies SI, auditory or visual hallucinations.

## 2016-09-11 ENCOUNTER — Encounter (HOSPITAL_COMMUNITY): Payer: Self-pay | Admitting: Registered Nurse

## 2016-09-11 DIAGNOSIS — F4310 Post-traumatic stress disorder, unspecified: Secondary | ICD-10-CM

## 2016-09-11 DIAGNOSIS — F329 Major depressive disorder, single episode, unspecified: Secondary | ICD-10-CM | POA: Diagnosis not present

## 2016-09-11 DIAGNOSIS — F1920 Other psychoactive substance dependence, uncomplicated: Secondary | ICD-10-CM | POA: Diagnosis not present

## 2016-09-11 LAB — COMPREHENSIVE METABOLIC PANEL
ALBUMIN: 3.9 g/dL (ref 3.5–5.0)
ALT: 18 U/L (ref 17–63)
ANION GAP: 8 (ref 5–15)
AST: 26 U/L (ref 15–41)
Alkaline Phosphatase: 79 U/L (ref 38–126)
BUN: 11 mg/dL (ref 6–20)
CHLORIDE: 106 mmol/L (ref 101–111)
CO2: 25 mmol/L (ref 22–32)
Calcium: 9 mg/dL (ref 8.9–10.3)
Creatinine, Ser: 0.69 mg/dL (ref 0.61–1.24)
GFR calc Af Amer: >60 mL/min (ref >60)
GFR calc non Af Amer: >60 mL/min (ref >60)
GLUCOSE: 88 mg/dL (ref 65–99)
POTASSIUM: 4.3 mmol/L (ref 3.5–5.1)
SODIUM: 139 mmol/L (ref 135–145)
Total Bilirubin: 0.3 mg/dL (ref 0.3–1.2)
Total Protein: 7.4 g/dL (ref 6.5–8.1)

## 2016-09-11 LAB — RAPID URINE DRUG SCREEN, HOSP PERFORMED
Amphetamines: NOT DETECTED
BENZODIAZEPINES: POSITIVE — AB
Barbiturates: NOT DETECTED
COCAINE: NOT DETECTED
Opiates: POSITIVE — AB
TETRAHYDROCANNABINOL: NOT DETECTED

## 2016-09-11 LAB — ETHANOL: Alcohol, Ethyl (B): 37 mg/dL — ABNORMAL HIGH (ref <5)

## 2016-09-11 MED ORDER — CLONIDINE HCL 0.1 MG PO TABS
0.1000 mg | ORAL_TABLET | Freq: Four times a day (QID) | ORAL | Status: DC
  Administered 2016-09-11 (×2): 0.1 mg via ORAL
  Filled 2016-09-11 (×2): qty 1

## 2016-09-11 MED ORDER — HYDROXYZINE HCL 25 MG PO TABS: 25.0000 mg | ORAL_TABLET | Freq: Four times a day (QID) | ORAL | Status: DC | PRN

## 2016-09-11 MED ORDER — DICYCLOMINE HCL 20 MG PO TABS: 20.0000 mg | ORAL_TABLET | Freq: Four times a day (QID) | ORAL | Status: DC | PRN

## 2016-09-11 MED ORDER — CLONIDINE HCL 0.1 MG PO TABS: 0.1000 mg | ORAL_TABLET | Freq: Every day | ORAL | Status: DC

## 2016-09-11 MED ORDER — ONDANSETRON 4 MG PO TBDP: 4.0000 mg | ORAL_TABLET | Freq: Four times a day (QID) | ORAL | Status: DC | PRN

## 2016-09-11 MED ORDER — NAPROXEN 500 MG PO TABS: 500.0000 mg | ORAL_TABLET | Freq: Two times a day (BID) | ORAL | Status: DC | PRN

## 2016-09-11 MED ORDER — CLONIDINE HCL 0.1 MG PO TABS: 0.1000 mg | ORAL_TABLET | ORAL | Status: DC

## 2016-09-11 MED ORDER — METHOCARBAMOL 500 MG PO TABS: 500.0000 mg | ORAL_TABLET | Freq: Three times a day (TID) | ORAL | Status: DC | PRN

## 2016-09-11 MED ORDER — LOPERAMIDE HCL 2 MG PO CAPS
2.0000 mg | ORAL_CAPSULE | ORAL | Status: DC | PRN
Start: 2016-09-11 — End: 2016-09-11

## 2016-09-11 NOTE — Consult Note (Signed)
[facility] Consult   Reason for Consult:  Worsening depression Referring Physician:  EDP Patient Identification: Sean Mccoy MRN:  [id number] Principal Diagnosis: Polysubstance (including opioids) dependence w/o physiol dependence ([place]) Diagnosis:   Patient Active Problem List   Diagnosis Date Noted  . Polysubstance (including opioids) dependence w/o physiol dependence ([place]) [F19.20] 09/11/2016  . Constipation due to pain medication therapy [K59.03] 02/17/2016  . Postoperative anemia due to acute blood loss [D62] 02/17/2016  . Osteoporosis [M81.0] 02/17/2016  . Right knee pain [M25.561] 02/17/2016  . Avascular necrosis of bone of right hip ([place]) [M87.051] 01/20/2016  . 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 Depression [F32.9] 11/29/2012  . Anxiety [F41.9] 11/29/2012  . Post traumatic stress disorder [F43.10] 11/29/2012  . Substance induced mood disorder ([place]) [F19.94] 11/29/2012  . Alcohol abuse [F10.10] 11/29/2012    Total Time spent with patient: 45 minutes  Subjective:   Sean Mccoy is a 55 y.o. male patient presented to [facility] with complaints of worsening depression; requesting psychiatric evaluation.  HPI:  Sean Mccoy 55 y.o. male patient seen by Dr. Darleene Cleaver and this provider.  Chart reviewed 09/11/16.   On evaluation:  Sean Mccoy reports that he was told that he need to go to the hospital to get an psychiatric evaluation before he could enter the program at [facility].  States that [facility] is a place that help individual with substance abuse and are homeless.  Patient denies suicidal/homicidal ideation, psychosis, and paranoia.  He does state that he has some depression but it is noting that he can't deal with; "I just wanted to get and evaluation"        Past Psychiatric History: Depression, Anxiety, PTSD  Risk to Self: Suicidal Ideation: Yes-Currently Present (sometimes passive) Suicidal Intent: No Is patient  at risk for suicide?: Yes Suicidal Plan?: No Access to Means: No What has been your use of drugs/alcohol within the last 12 months?: see Sa section Intentional Self Injurious Behavior: None Risk to Others: Homicidal Ideation: No Thoughts of Harm to Others: No Current Homicidal Intent: No Current Homicidal Plan: No Access to Homicidal Means: No History of harm to others?: No Assessment of Violence: None Noted Does patient have access to weapons?: No Criminal Charges Pending?: No Does patient have a court date: No Prior Inpatient Therapy: Prior Inpatient Therapy: Yes Prior Therapy Dates: 2013 Prior Therapy Facilty/Provider(s): [facility] Reason for Treatment: depression Prior Outpatient Therapy: Prior Outpatient Therapy: Yes Prior Therapy Dates: unk Prior Therapy Facilty/Provider(s): [facility] Reason for Treatment: depression Does patient have an ACCT team?: No Does patient have Intensive In-House Services?  : No Does patient have [facility] services? : No Does patient have P4CC services?: No  Past Medical History:  Past Medical History:  Diagnosis Date  . Ankle injury   . Anxiety   . Arthritis   . Avascular necrosis ([place])   . Depression   . Groin injury   . Hypertension   . Knee fracture   . PTSD (post-traumatic stress disorder)     Past Surgical History:  Procedure Laterality Date  . KNEE SURGERY    . TOTAL HIP ARTHROPLASTY Right 01/20/2016   Procedure: RIGHT TOTAL HIP ARTHROPLASTY ANTERIOR APPROACH;  Surgeon: Mcarthur Rossetti, MD;  Location: [place];  Service: Orthopedics;  Laterality: Right;   Family History:  Family History  Problem Relation Age of Onset  . Diabetes Mother   . Hypertension Mother   . Diabetes Father   .  Hypertension Father    Family Psychiatric  History: Denies Social History:  History  Alcohol Use  . 1.2 oz/week  . 2 Cans of beer per week    Comment: occ     History  Drug Use No    Social History   Social History  .  Marital status: Single    Spouse name: N/A  . Number of children: N/A  . Years of education: N/A   Social History Main Topics  . Smoking status: Never Smoker  . Smokeless tobacco: Never Used  . Alcohol use 1.2 oz/week    2 Cans of beer per week     Comment: occ  . Drug use: No  . Sexual activity: Yes    Birth control/ protection: Condom   Other Topics Concern  . None   Social History Narrative  . None   Additional Social History:    Allergies:  No Known Allergies  Labs:  Results for orders placed or performed during the hospital encounter of 09/10/16 (from the past 48 hour(s))  Rapid urine drug screen (hospital performed)     Status: Abnormal   Collection Time: 09/10/16 12:21 AM  Result Value Ref Range   Opiates POSITIVE (A) NONE DETECTED   Cocaine NONE DETECTED NONE DETECTED   Benzodiazepines POSITIVE (A) NONE DETECTED   Amphetamines NONE DETECTED NONE DETECTED   Tetrahydrocannabinol NONE DETECTED NONE DETECTED   Barbiturates NONE DETECTED NONE DETECTED    Comment:        DRUG SCREEN FOR MEDICAL PURPOSES ONLY.  IF CONFIRMATION IS NEEDED FOR ANY PURPOSE, NOTIFY LAB WITHIN 5 DAYS.        LOWEST DETECTABLE LIMITS FOR URINE DRUG SCREEN Drug Class       Cutoff (ng/mL) Amphetamine      1000 Barbiturate      200 Benzodiazepine   282 Tricyclics       060 Opiates          300 Cocaine          300 THC              50   CBC with Differential     Status: Abnormal   Collection Time: 09/10/16 11:45 PM  Result Value Ref Range   WBC 3.9 (L) 4.0 - 10.5 K/uL   RBC 3.55 (L) 4.22 - 5.81 MIL/uL   Hemoglobin 11.2 (L) 13.0 - 17.0 g/dL   HCT 34.8 (L) 39.0 - 52.0 %   MCV 98.0 78.0 - 100.0 fL   MCH 31.5 26.0 - 34.0 pg   MCHC 32.2 30.0 - 36.0 g/dL   RDW 13.6 11.5 - 15.5 %   Platelets 247 150 - 400 K/uL   Neutrophils Relative % 26 %   Neutro Abs 1.0 (L) 1.7 - 7.7 K/uL   Lymphocytes Relative 57 %   Lymphs Abs 2.2 0.7 - 4.0 K/uL   Monocytes Relative 15 %   Monocytes Absolute  0.6 0.1 - 1.0 K/uL   Eosinophils Relative 2 %   Eosinophils Absolute 0.1 0.0 - 0.7 K/uL   Basophils Relative 0 %   Basophils Absolute 0.0 0.0 - 0.1 K/uL  Comprehensive metabolic panel     Status: None   Collection Time: 09/10/16 11:45 PM  Result Value Ref Range   Sodium 139 135 - 145 mmol/L   Potassium 4.3 3.5 - 5.1 mmol/L   Chloride 106 101 - 111 mmol/L   CO2 25 22 - 32 mmol/L   Glucose, Bld  88 65 - 99 mg/dL   BUN 11 6 - 20 mg/dL   Creatinine, Ser 0.69 0.61 - 1.24 mg/dL   Calcium 9.0 8.9 - 10.3 mg/dL   Total Protein 7.4 6.5 - 8.1 g/dL   Albumin 3.9 3.5 - 5.0 g/dL   AST 26 15 - 41 U/L   ALT 18 17 - 63 U/L   Alkaline Phosphatase 79 38 - 126 U/L   Total Bilirubin 0.3 0.3 - 1.2 mg/dL   GFR calc non Af Amer >60 >60 mL/min   GFR calc Af Amer >60 >60 mL/min    Comment: (NOTE) The eGFR has been calculated using the CKD EPI equation. This calculation has not been validated in all clinical situations. eGFR's persistently <60 mL/min signify possible Chronic Kidney Disease.    Anion gap 8 5 - 15  Ethanol     Status: Abnormal   Collection Time: 09/10/16 11:45 PM  Result Value Ref Range   Alcohol, Ethyl (B) 37 (H) <5 mg/dL    Comment:        LOWEST DETECTABLE LIMIT FOR SERUM ALCOHOL IS 5 mg/dL FOR MEDICAL PURPOSES ONLY     Current Facility-Administered Medications  Medication Dose Route Frequency Provider Last Rate Last Dose  . cloNIDine (CATAPRES) tablet 0.1 mg  0.1 mg Oral QID Lurena Nida, NP   0.1 mg at 09/11/16 0910   Followed by  . [START ON 09/13/2016] cloNIDine (CATAPRES) tablet 0.1 mg  0.1 mg Oral BH-qamhs Lurena Nida, NP       Followed by  . [START ON 09/15/2016] cloNIDine (CATAPRES) tablet 0.1 mg  0.1 mg Oral QAC breakfast Lurena Nida, NP      . dicyclomine (BENTYL) tablet 20 mg  20 mg Oral Q6H PRN Lurena Nida, NP      . hydrOXYzine (ATARAX/VISTARIL) tablet 25 mg  25 mg Oral Q6H PRN Lurena Nida, NP      . loperamide (IMODIUM) capsule 2-4 mg  2-4 mg Oral PRN  Lurena Nida, NP      . methocarbamol (ROBAXIN) tablet 500 mg  500 mg Oral Q8H PRN Lurena Nida, NP      . naproxen (NAPROSYN) tablet 500 mg  500 mg Oral BID PRN Lurena Nida, NP      . ondansetron (ZOFRAN-ODT) disintegrating tablet 4 mg  4 mg Oral Q6H PRN Lurena Nida, NP       Current Outpatient Prescriptions  Medication Sig Dispense Refill  . calcium carbonate (TUMS) 500 MG chewable tablet Chew 1 tablet (200 mg of elemental calcium total) by mouth 3 (three) times daily with meals. (Patient taking differently: Chew 1 tablet by mouth 3 (three) times daily as needed for indigestion. ) 90 tablet prn  . Menthol, Topical Analgesic, (BIOFREEZE) 4 % GEL Apply 1 application topically 2 (two) times daily. AND twice daily as needed (Patient taking differently: Apply 1 application topically 4 (four) times daily as needed. For pain.) 1 Tube prn  . naproxen (NAPROSYN) 375 MG tablet Take 1 tablet (375 mg total) by mouth 2 (two) times daily with a meal. (Patient taking differently: Take 375 mg by mouth 2 (two) times daily as needed. For pain.) 20 tablet 0  . oxyCODONE-acetaminophen (PERCOCET/ROXICET) 5-325 MG tablet Take 1-2 tablets by mouth every 4 (four) hours as needed for severe pain. (Patient taking differently: Take 2 tablets by mouth every 4 (four) hours as needed for severe pain. ) 60 tablet 0  . polyethylene  glycol (MIRALAX / GLYCOLAX) packet Take 17 g by mouth daily as needed (for constipation.).     Marland Kitchen alendronate (FOSAMAX) 70 MG tablet Take 1 tablet (70 mg total) by mouth every 7 (seven) days. Take with a full glass of water on an empty stomach. (Patient not taking: Reported on 09/11/2016) 4 tablet 11  . aspirin EC 325 MG EC tablet Take 1 tablet (325 mg total) by mouth 2 (two) times daily after a meal. 30 tablet 0  . Cholecalciferol (VITAMIN D) 2000 units CAPS Take 1 capsule (2,000 Units total) by mouth daily. (Patient not taking: Reported on 09/11/2016) 30 capsule prn  . ferrous sulfate 325 (65 FE)  MG tablet Take 1 tablet (325 mg total) by mouth 2 (two) times daily with a meal. (Patient not taking: Reported on 09/11/2016) 120 tablet 0    Musculoskeletal: Strength & Muscle Tone: within normal limits Gait & Station: normal Patient leans: N/A  Psychiatric Specialty Exam: Physical Exam  Nursing note and vitals reviewed. Constitutional: He is oriented to person, place, and time.  Neck: Normal range of motion. Neck supple.  Respiratory: Effort normal.  Musculoskeletal: Normal range of motion.  Neurological: He is alert and oriented to person, place, and time.  Skin: Skin is warm and dry.    Review of Systems  Psychiatric/Behavioral: Positive for substance abuse. Negative for depression, hallucinations and suicidal ideas. The patient is not nervous/anxious.   All other systems reviewed and are negative.   Blood pressure 132/76, pulse 64, temperature 98.2 F (36.8 C), temperature source Oral, resp. rate 18, SpO2 98 %.There is no height or weight on file to calculate BMI.  General Appearance: Casual  Eye Contact:  Good  Speech:  Clear and Coherent and Normal Rate  Volume:  Normal  Mood:  "Good"  Affect:  Appropriate  Thought Process:  Coherent and Goal Directed  Orientation:  Full (Time, Place, and Person)  Thought Content:  Logical  Suicidal Thoughts:  No  Homicidal Thoughts:  No  Memory:  Immediate;   Good Recent;   Good Remote;   Good  Judgement:  Fair  Insight:  Present  Psychomotor Activity:  Normal  Concentration:  Concentration: Fair and Attention Span: Fair  Recall:  Good  Fund of Knowledge:  Fair  Language:  Good  Akathisia:  No  Handed:  Right  AIMS (if indicated):     Assets:  Communication Skills Desire for Improvement Physical Health  ADL's:  Intact  Cognition:  WNL  Sleep:        Treatment Plan Summary: Discharge home with referral to [facility]  Disposition: No evidence of imminent risk to self or others at present.   Patient does not meet criteria  for psychiatric inpatient admission.  Earleen Newport, NP 09/11/2016 12:57 PM  Patient seen face-to-face for psychiatric evaluation, chart reviewed and case discussed with the physician extender and developed treatment plan. Reviewed the information documented and agree with the treatment plan. Corena Pilgrim, MD

## 2016-09-11 NOTE — ED Notes (Signed)
Pt d/c home per MD order. Discharge summary reviewed with pt.Pt verbalizes understanding of discharge summary and resources. Pt denies SI/HI. Pt denies AVH. Pt denies s/s of withdrawal symptoms.No s/s of distress noted. Pt signed for personal property and property returned. Pt signed e-signature. Ambulatory off unit.

## 2016-09-11 NOTE — ED Notes (Signed)
Pt behavior calm and cooperative. Pt denies SI/HI. Pt denies AVH. Pt reports he came to the hospital d/t "requirement to be in a program." Special checks q 15 mins in place for safety. Video monitoring in place.

## 2016-09-11 NOTE — ED Notes (Signed)
Patient reports SI with no plan and denies HI and AVH at this time. Plan of car discussed with patient. Patient voices no complaints at this time. Encouragement and support provided and safety maintain. Q 15 min safety checks in place and video monitoring.

## 2016-09-11 NOTE — [facility] Suicide Risk Assessment (Cosign Needed)
Suicide Risk Assessment  Discharge Assessment   [facility] Discharge Suicide Risk Assessment   Principal Problem: Polysubstance (including opioids) dependence w/o physiol dependence [facility]) Discharge Diagnoses:  Patient Active Problem List   Diagnosis Date Noted  . Polysubstance (including opioids) dependence w/o physiol dependence ([facility]) [F19.20] 09/11/2016  . Constipation due to pain medication therapy [K59.03] 02/17/2016  . Postoperative anemia due to acute blood loss [D62] 02/17/2016  . Osteoporosis [M81.0] 02/17/2016  . Right knee pain [M25.561] 02/17/2016  . Avascular necrosis of bone of right hip ([facility]) [M87.051] 01/20/2016  . 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 Depression [F32.9] 11/29/2012  . Anxiety [F41.9] 11/29/2012  . Post traumatic stress disorder [F43.10] 11/29/2012  . Substance induced mood disorder ([facility]) [F19.94] 11/29/2012  . Alcohol abuse [F10.10] 11/29/2012    Total Time spent with patient: 45 minutes Musculoskeletal: Strength & Muscle Tone: within normal limits Gait & Station: normal Patient leans: N/A  Psychiatric Specialty Exam: Physical Exam  Nursing note and vitals reviewed. Constitutional: He is oriented to person, place, and time.  Neck: Normal range of motion. Neck supple.  Respiratory: Effort normal.  Musculoskeletal: Normal range of motion.  Neurological: He is alert and oriented to person, place, and time.  Skin: Skin is warm and dry.    Review of Systems  Psychiatric/Behavioral: Positive for substance abuse. Negative for depression, hallucinations and suicidal ideas. The patient is not nervous/anxious.   All other systems reviewed and are negative.   Blood pressure 131/64, pulse 74, temperature 98.2 F (36.8 C), temperature source Oral, resp. rate 18, SpO2 96 %.There is no height or weight on file to calculate BMI.  General Appearance: Casual  Eye Contact:  Good  Speech:  Clear and Coherent and Normal Rate  Volume:   Normal  Mood:  "Good"  Affect:  Appropriate  Thought Process:  Coherent and Goal Directed  Orientation:  Full (Time, Place, and Person)  Thought Content:  Logical  Suicidal Thoughts:  No  Homicidal Thoughts:  No  Memory:  Immediate;   Good Recent;   Good Remote;   Good  Judgement:  Fair  Insight:  Present  Psychomotor Activity:  Normal  Concentration:  Concentration: Fair and Attention Span: Fair  Recall:  Good  Fund of Knowledge:  Fair  Language:  Good  Akathisia:  No  Handed:  Right  AIMS (if indicated):     Assets:  Communication Skills Desire for Improvement Physical Health  ADL's:  Intact  Cognition:  WNL  Sleep:          Mental Status Per Nursing Assessment::   On Admission:    Requesting a psychiatric evaluation in the ER  Demographic Factors:  Male  Loss Factors: NA  Historical Factors: Stubstance abuse  Risk Reduction Factors:   Employed  Continued Clinical Symptoms:  Alcohol/Substance Abuse/Dependencies  Cognitive Features That Contribute To Risk:  None    Suicide Risk:  Minimal: No identifiable suicidal ideation.  Patients presenting with no risk factors but with morbid ruminations; may be classified as minimal risk based on the severity of the depressive symptoms    Plan Of Care/Follow-up recommendations:  Activity:  As tolerated Diet:  As tolerated  Sean Henrie, NP 09/11/2016, 12:59 PM

## 2016-10-06 ENCOUNTER — Emergency Department (HOSPITAL_COMMUNITY)
Admission: EM | Admit: 2016-10-06 | Discharge: 2016-10-06 | Disposition: A | Discharge status: Home / Self Care | Payer: Medicaid Other | Attending: Emergency Medicine | Admitting: Emergency Medicine

## 2016-10-06 ENCOUNTER — Encounter (HOSPITAL_COMMUNITY): Payer: Self-pay | Admitting: *Deleted

## 2016-10-06 DIAGNOSIS — Z79899 Other long term (current) drug therapy: Secondary | ICD-10-CM | POA: Diagnosis not present

## 2016-10-06 DIAGNOSIS — H10021 Other mucopurulent conjunctivitis, right eye: Secondary | ICD-10-CM | POA: Insufficient documentation

## 2016-10-06 DIAGNOSIS — Z96641 Presence of right artificial hip joint: Secondary | ICD-10-CM | POA: Insufficient documentation

## 2016-10-06 DIAGNOSIS — H579 Unspecified disorder of eye and adnexa: Secondary | ICD-10-CM | POA: Diagnosis present

## 2016-10-06 DIAGNOSIS — Z7982 Long term (current) use of aspirin: Secondary | ICD-10-CM | POA: Insufficient documentation

## 2016-10-06 DIAGNOSIS — I10 Essential (primary) hypertension: Secondary | ICD-10-CM | POA: Insufficient documentation

## 2016-10-06 MED ORDER — SULFACETAMIDE SODIUM 10 % OP SOLN: 1.0000 [drp] | OPHTHALMIC | 0 refills | Status: DC

## 2016-10-06 NOTE — ED Triage Notes (Signed)
Pt reports having right eye irritation, pain, swelling. Denies any injury. Has yellow discharge from eye.

## 2016-10-06 NOTE — ED Provider Notes (Signed)
[facility]-EMERGENCY DEPT Provider Note   CSN: [id number] Arrival date & time: 10/06/16  1224  By signing my name below, I, Sean Mccoy, attest that this documentation has been prepared under the direction and in the presence of non-physician practitioner, Teressa Lower, NP. Electronically Signed: Freida Mccoy, Scribe. 10/06/2016. 12:45 PM.   History   Chief Complaint Chief Complaint  Patient presents with  . Eye Problem    The history is provided by the patient. No language interpreter was used.   HPI Comments:  Sean Mccoy is a 55 y.o. male who presents to the Emergency Department complaining of right eye swelling and redness x a few days. Pt reports associated drainage and blurry vision from the eye. Pt states he feels like he has a film over his eye. He denies h/o pink eye. He denies also injury and use of  contact lenses. No alleviating factors noted. Pt has no other complaints or symptoms at this time.    Past Medical History:  Diagnosis Date  . Ankle injury   . Anxiety   . Arthritis   . Avascular necrosis ([facility])   . Depression   . Groin injury   . Hypertension   . Knee fracture   . PTSD (post-traumatic stress disorder)     Patient Active Problem List   Diagnosis Date Noted  . Polysubstance (including opioids) dependence w/o physiol dependence ([facility]) 09/11/2016  . Constipation due to pain medication therapy 02/17/2016  . Postoperative anemia due to acute blood loss 02/17/2016  . Osteoporosis 02/17/2016  . Right knee pain 02/17/2016  . Avascular necrosis of bone of right hip ([facility]) 01/20/2016  . 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 Depression 11/29/2012  . Anxiety 11/29/2012  . Post traumatic stress disorder 11/29/2012  . Substance induced mood disorder ([facility]) 11/29/2012  . Alcohol abuse 11/29/2012    Past Surgical History:  Procedure Laterality Date  . KNEE SURGERY    . TOTAL HIP ARTHROPLASTY Right 01/20/2016   Procedure: RIGHT TOTAL HIP  ARTHROPLASTY ANTERIOR APPROACH;  Surgeon: Kathryne Hitch, MD;  [facility];  Service: Orthopedics;  Laterality: Right;       Home Medications    Prior to Admission medications   Medication Sig Start Date End Date Taking? Authorizing Provider  alendronate (FOSAMAX) 70 MG tablet Take 1 tablet (70 mg total) by mouth every 7 (seven) days. Take with a full glass of water on an empty stomach. Patient not taking: Reported on 09/11/2016 02/17/16   [name], NP  aspirin EC 325 MG EC tablet Take 1 tablet (325 mg total) by mouth 2 (two) times daily after a meal. 01/23/16   Kathryne Hitch, MD  calcium carbonate (TUMS) 500 MG chewable tablet Chew 1 tablet (200 mg of elemental calcium total) by mouth 3 (three) times daily with meals. Patient taking differently: Chew 1 tablet by mouth 3 (three) times daily as needed for indigestion.  02/17/16   [name], NP  Cholecalciferol (VITAMIN D) 2000 units CAPS Take 1 capsule (2,000 Units total) by mouth daily. Patient not taking: Reported on 09/11/2016 02/17/16   [name], NP  ferrous sulfate 325 (65 FE) MG tablet Take 1 tablet (325 mg total) by mouth 2 (two) times daily with a meal. Patient not taking: Reported on 09/11/2016 01/26/16   [name], NP  Menthol, Topical Analgesic, (BIOFREEZE) 4 % GEL Apply 1 application topically 2 (two) times daily. AND twice daily as needed Patient  taking differently: Apply 1 application topically 4 (four) times daily as needed. For pain. 02/17/16   [name], NP  naproxen (NAPROSYN) 375 MG tablet Take 1 tablet (375 mg total) by mouth 2 (two) times daily with a meal. Patient taking differently: Take 375 mg by mouth 2 (two) times daily as needed. For pain. 04/16/16   [name], PA-C  oxyCODONE-acetaminophen (PERCOCET/ROXICET) 5-325 MG tablet Take 1-2 tablets by mouth every 4 (four) hours as needed for severe pain. Patient taking differently: Take 2 tablets by mouth every 4 (four) hours as  needed for severe pain.  01/23/16   Kathryne Hitch, MD  polyethylene glycol [facility] Ethelene Hal) packet Take 17 g by mouth daily as needed (for constipation.).     Historical Provider, MD    Family History Family History  Problem Relation Age of Onset  . Diabetes Mother   . Hypertension Mother   . Diabetes Father   . Hypertension Father     Social History Social History  Substance Use Topics  . Smoking status: Never Smoker  . Smokeless tobacco: Never Used  . Alcohol use 1.2 oz/week    2 Cans of beer per week     Comment: occ     Allergies   Review of patient's allergies indicates no known allergies.   Review of Systems Review of Systems  Constitutional: Negative for fever.  Eyes: Positive for discharge, redness and visual disturbance.  All other systems reviewed and are negative.  Physical Exam Updated Vital Signs BP 145/87 (BP Location: Left Arm)   Pulse 79   Temp 98.5 F (36.9 C) (Oral)   Resp 18   SpO2 99%   Physical Exam  Constitutional: He is oriented to person, place, and time. He appears well-developed and well-nourished. No distress.  HENT:  Head: Normocephalic and atraumatic.  Left Ear: External ear normal.  Mouth/Throat: Oropharynx is clear and moist.  Eyes: Pupils are equal, round, and reactive to light. Right eye exhibits discharge and exudate. Right conjunctiva is injected.  Cardiovascular: Normal rate.   Pulmonary/Chest: Effort normal.  Abdominal: He exhibits no distension.  Neurological: He is alert and oriented to person, place, and time.  Skin: Skin is warm and dry.  Psychiatric: He has a normal mood and affect.  Nursing note and vitals reviewed.    ED Treatments / Results  DIAGNOSTIC STUDIES:  Oxygen Saturation is 99% on RA, normal by my interpretation.    COORDINATION OF CARE:  12:44 PM Discussed treatment plan with pt at bedside and pt agreed to plan.  Labs (all labs ordered are listed, but only abnormal results are  displayed) Labs Reviewed - No data to display  EKG  EKG Interpretation None       Radiology No results found.  Procedures Procedures (including critical care time)  Medications Ordered in ED Medications - No data to display   Initial Impression / Assessment and Plan / ED Course  I have reviewed the triage vital signs and the nursing notes.  Pertinent labs & imaging results that were available during my care of the patient were reviewed by me and considered in my medical decision making (see chart for details).  Clinical Course    Discussed follow up as needed. Pt is not a contact lense wearer Patient presentation consistent with conjunctivitis.  No evidence of corneal abrasions, entrapment, consensual photophobia, or herpes keratitis.  Presentation not concerning for iritis, or corneal abrasions.  Pt discharged with drops and .  Personal hygiene and frequent handwashing discussed.  Patient advised to follow up with ophthalmologist if symptoms persist or worsen. Return precautions discussed.  Patient verbalizes understanding and is agreeable with discharge.  Final Clinical Impressions(s) / ED Diagnoses   Final diagnoses:  None    New Prescriptions New Prescriptions   No medications on file   I personally performed the services described in this documentation, which was scribed in my presence. The recorded information has been reviewed and is accurate.     Teressa Lower, NP 10/06/16 1300    Shaune Pollack, MD 10/06/16 1755

## 2016-10-14 IMAGING — CR DG KNEE AP/LAT W/ SUNRISE*R*
3 series · 3 of 3 positions shown · non-contrast
Comparison: Right knee series of January 24, 2012

CLINICAL DATA: Chronic right knee pain without known injury ;
recent knee injection

EXAM:
DG KNEE - 3 VIEWS

[t knee ap right]
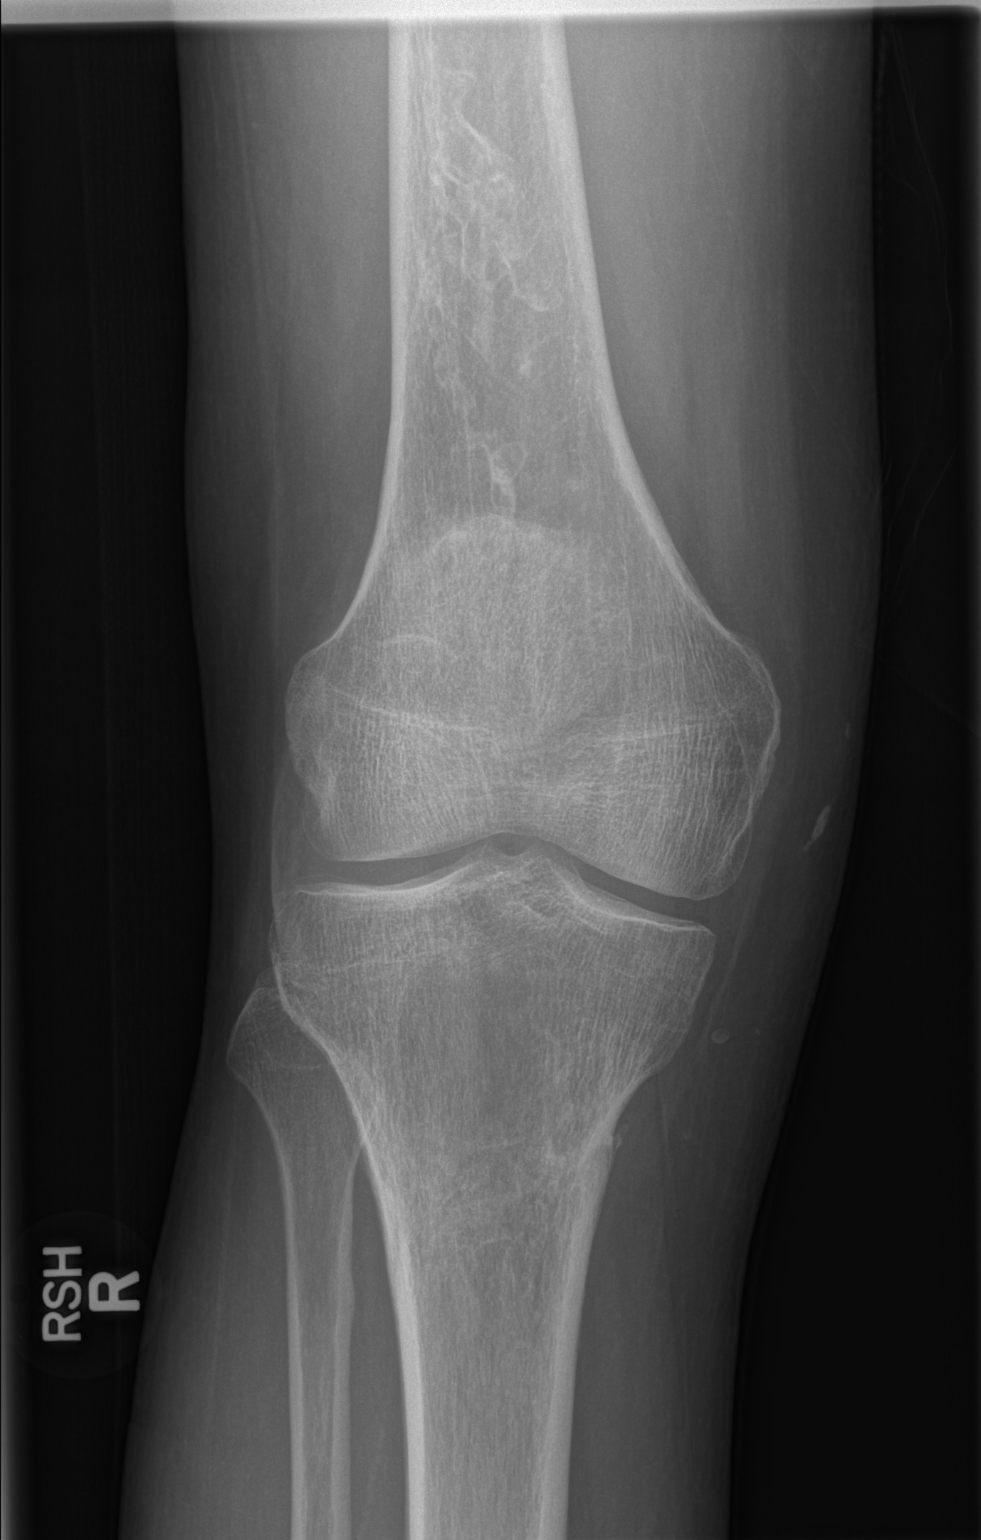

[t knee lat right]
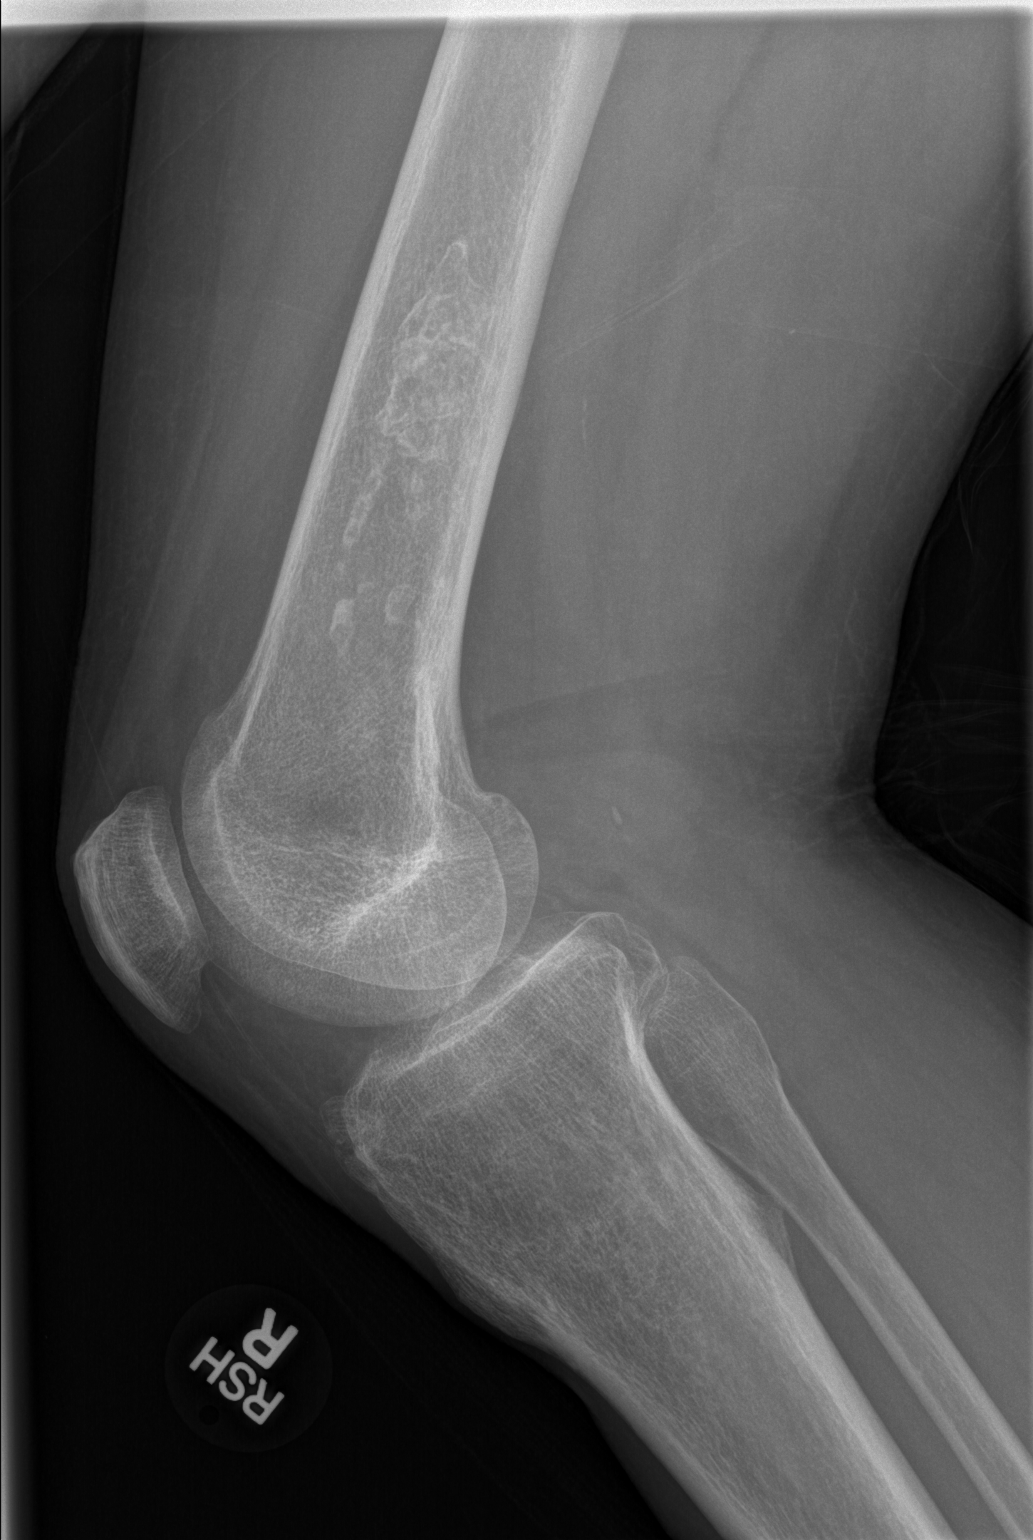

[x knee patella right]
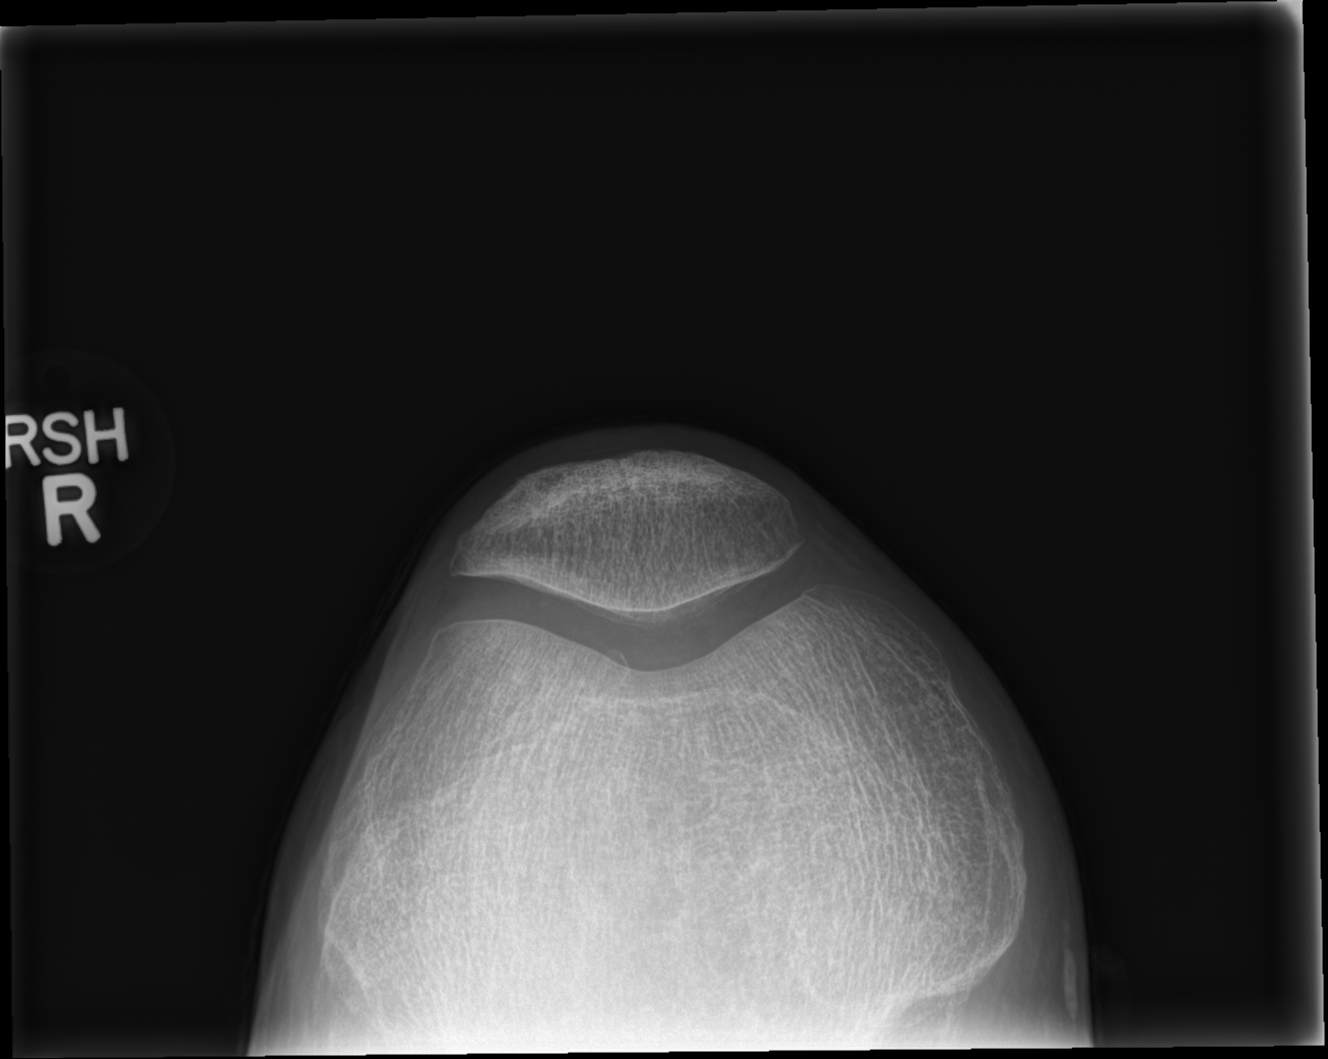

[3 of 3 positions shown; findings below may reference images not displayed]

FINDINGS: The bones are adequately mineralized. There is stable sclerosis in
the distal diaphysis of the right femur likely due to old bone
infarct or enchondroma. There is mild beaking of the tibial spines.
The medial and lateral joint spaces are reasonably well-maintained.
The patellofemoral joint is unremarkable. There is no significant
joint effusion.
IMPRESSION: There are mild degenerative changes of the right knee little changed
from the previous study. There is no acute bony abnormality.

## 2017-04-16 ENCOUNTER — Emergency Department (HOSPITAL_COMMUNITY): Payer: Medicaid Other

## 2017-04-16 ENCOUNTER — Encounter (HOSPITAL_COMMUNITY): Payer: Self-pay

## 2017-04-16 ENCOUNTER — Emergency Department (HOSPITAL_COMMUNITY)
Admission: EM | Admit: 2017-04-16 | Discharge: 2017-04-16 | Disposition: A | Discharge status: Home / Self Care | Payer: Medicaid Other | Attending: Emergency Medicine | Admitting: Emergency Medicine

## 2017-04-16 DIAGNOSIS — M109 Gout, unspecified: Secondary | ICD-10-CM | POA: Insufficient documentation

## 2017-04-16 DIAGNOSIS — Z7982 Long term (current) use of aspirin: Secondary | ICD-10-CM | POA: Diagnosis not present

## 2017-04-16 DIAGNOSIS — M79671 Pain in right foot: Secondary | ICD-10-CM | POA: Diagnosis present

## 2017-04-16 DIAGNOSIS — I10 Essential (primary) hypertension: Secondary | ICD-10-CM | POA: Diagnosis not present

## 2017-04-16 DIAGNOSIS — M79672 Pain in left foot: Secondary | ICD-10-CM

## 2017-04-16 DIAGNOSIS — Z96641 Presence of right artificial hip joint: Secondary | ICD-10-CM | POA: Insufficient documentation

## 2017-04-16 MED ORDER — INDOMETHACIN 50 MG PO CAPS: 50.0000 mg | ORAL_CAPSULE | Freq: Two times a day (BID) | ORAL | 0 refills | Status: DC

## 2017-04-16 NOTE — ED Provider Notes (Signed)
[facility]-EMERGENCY DEPT Provider Note   CSN: [id number] Arrival date & time: 04/16/17  7282  By signing my name below, I, Sean Mccoy, attest that this documentation has been prepared under the direction and in the presence of [name], [place] . Electronically Signed: [name], ED Scribe. 04/16/2017. 11:56 AM.  History   Chief Complaint Chief Complaint  Patient presents with  . Foot Pain   The history is provided by the patient. No language interpreter was used.   HPI Comments: Sean Mccoy is a 56 y.o. male with PMHx of arthritis and HTN, who presents to the Emergency Department complaining of gradually worsening, right second and third toe pain that began ~2 weeks ago. Pt reports associated joint swelling and states his pain is exacerbated with ambulation and palpation. He notes he wears many "tight-fitting shoes" but reports he has not worn any new shoes recently. Pt denies any recent trauma, surgery, procedures or injury to his foot, fever, chills, recent pedicures, and hx of gout, DM, or renal disorder.  Patient denies history of hammer toe surgery. Past Medical History:  Diagnosis Date  . Ankle injury   . Anxiety   . Arthritis   . Avascular necrosis (HCC)   . Depression   . Groin injury   . Hypertension   . Knee fracture   . PTSD (post-traumatic stress disorder)     Patient Active Problem List   Diagnosis Date Noted  . Polysubstance (including opioids) dependence w/o physiol dependence (HCC) 09/11/2016  . Constipation due to pain medication therapy 02/17/2016  . Postoperative anemia due to acute blood loss 02/17/2016  . Osteoporosis 02/17/2016  . Right knee pain 02/17/2016  . Avascular necrosis of bone of right hip (HCC) 01/20/2016  . 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 Depression 11/29/2012  . Anxiety 11/29/2012  . Post traumatic stress disorder 11/29/2012  . Substance induced mood disorder (HCC) 11/29/2012  . Alcohol abuse 11/29/2012    Past Surgical History:  Procedure Laterality Date  . KNEE SURGERY    . TOTAL HIP ARTHROPLASTY Right 01/20/2016   Procedure: RIGHT TOTAL HIP ARTHROPLASTY ANTERIOR APPROACH;  Surgeon: Kathryne Hitch, MD;  Location: [facility] OR;  Service: Orthopedics;  Laterality: Right;    Home Medications    Prior to Admission medications   Medication Sig Start Date End Date Taking? Authorizing Provider  alendronate (FOSAMAX) 70 MG tablet Take 1 tablet (70 mg total) by mouth every 7 (seven) days. Take with a full glass of water on an empty stomach. Patient not taking: Reported on 09/11/2016 02/17/16   [name], NP  aspirin EC 325 MG EC tablet Take 1 tablet (325 mg total) by mouth 2 (two) times daily after a meal. 01/23/16   Kathryne Hitch, MD  calcium carbonate (TUMS) 500 MG chewable tablet Chew 1 tablet (200 mg of elemental calcium total) by mouth 3 (three) times daily with meals. Patient taking differently: Chew 1 tablet by mouth 3 (three) times daily as needed for indigestion.  02/17/16   [name], NP  Cholecalciferol (VITAMIN D) 2000 units CAPS Take 1 capsule (2,000 Units total) by mouth daily. Patient not taking: Reported on 09/11/2016 02/17/16   [name], NP  ferrous sulfate 325 (65 FE) MG tablet Take 1 tablet (325 mg total) by mouth 2 (two) times daily with a meal. Patient not taking: Reported on 09/11/2016 01/26/16   [name], NP  indomethacin (INDOCIN) 50 MG capsule Take 1  capsule (50 mg total) by mouth 2 (two) times daily with a meal. 04/16/17   [name], DO  Menthol, Topical Analgesic, (BIOFREEZE) 4 % GEL Apply 1 application topically 2 (two) times daily. AND twice daily as needed Patient taking differently: Apply 1 application topically 4 (four) times daily as needed. For pain. 02/17/16   [name], NP  naproxen (NAPROSYN) 375 MG tablet Take 1 tablet (375 mg total) by mouth 2 (two) times daily with a meal. Patient taking differently: Take 375 mg by mouth 2  (two) times daily as needed. For pain. 04/16/16   [name], PA-C  oxyCODONE-acetaminophen (PERCOCET/ROXICET) 5-325 MG tablet Take 1-2 tablets by mouth every 4 (four) hours as needed for severe pain. Patient taking differently: Take 2 tablets by mouth every 4 (four) hours as needed for severe pain.  01/23/16   Kathryne Hitch, MD  polyethylene glycol [facility] / Ethelene Hal) packet Take 17 g by mouth daily as needed (for constipation.).     Historical Provider, MD  sulfacetamide (BLEPH-10) 10 % ophthalmic solution Place 1-2 drops into the right eye every 2 (two) hours while awake. 10/06/16   Teressa Lower, NP    Family History Family History  Problem Relation Age of Onset  . Diabetes Mother   . Hypertension Mother   . Diabetes Father   . Hypertension Father     Social History Social History  Substance Use Topics  . Smoking status: Never Smoker  . Smokeless tobacco: Never Used  . Alcohol use 1.2 oz/week    2 Cans of beer per week     Comment: occ   Allergies   Patient has no known allergies.  Review of Systems Review of Systems  Constitutional: Negative for chills, fatigue and fever.  Musculoskeletal: Positive for arthralgias and joint swelling.  Skin: Negative for wound.   Physical Exam Updated Vital Signs BP (!) 142/82 (BP Location: Left Arm)   Pulse 89   Temp 98 F (36.7 C) (Oral)   Resp 20   SpO2 98%   Physical Exam  Constitutional: He appears well-developed and well-nourished.  HENT:  Head: Normocephalic.  Eyes: Conjunctivae are normal.  Neck: Normal range of motion.  Cardiovascular: Normal rate.   Pulmonary/Chest: Effort normal.  Abdominal: He exhibits no distension.  Musculoskeletal: Normal range of motion. He exhibits edema and tenderness.  Second and third toes are TTP at dip and edematous without redness, induration, drainage, or abscess. His nails do not appear to be infected. In between his toes, there is no sign of fungal infection, skin on his  feet/toes is intact.  There is no redness, tenderness or swelling to the foot or other toes.  Neurological: He is alert.  Skin: Skin is warm and dry. There is erythema.  Psychiatric: He has a normal mood and affect.  Nursing note and vitals reviewed.  ED Treatments / Results  DIAGNOSTIC STUDIES:  Oxygen Saturation is 98% on RA, normal by my interpretation.    COORDINATION OF CARE:  11:44 AM Discussed treatment plan with pt at bedside and pt agreed to plan. Pt has been advised to follow-up with his PCP and consider different footwear.   Labs (all labs ordered are listed, but only abnormal results are displayed) Labs Reviewed - No data to display  EKG  EKG Interpretation None       Radiology Dg Foot Complete Right  Result Date: 04/16/2017 CLINICAL DATA:  Pt reports pain in right foot with no known injury or hx  of injury x 2 weeks. c/o pain from 2nd-4th toe with some swelling as well. EXAM: RIGHT FOOT COMPLETE - 3+ VIEW COMPARISON:  None. FINDINGS: Diffuse osteopenia which limits characterization of osseous detail, however, no fracture line or displaced fracture fragment seen. No acute appearing cortical irregularity or osseous lesion. No significant degenerative change. Adjacent soft tissues are unremarkable. IMPRESSION: 1. Osteopenia. 2. No acute findings. Electronically Signed   By: Bary Richard M.D.   On: 04/16/2017 09:50   Procedures Procedures (including critical care time)  Medications Ordered in ED Medications - No data to display  Initial Impression / Assessment and Plan / ED Course  I have reviewed the triage vital signs and the nursing notes.  Pertinent labs & imaging results that were available during my care of the patient were reviewed by me and considered in my medical decision making (see chart for details).    Pt presents with toe pain, swelling and erythema to .  Pt is afebrile and stable. Imaging reviewed, no evidence of occult fracture or injury. Renal  function good. Pt without known peptic ulcer disease and not receiving concurrent treatment on warfarin. Pt dc with indomethacin (50 mg PO TID). Discussed that pt should respond to treatment with in 24 hour of begining treatment & likely resolve in 2-3 days. Patient was given return precautions and voiced understanding.  Patient was told to wear supportive shoes that are well ventilated with a wide toe box.  While in the ED patient was noted to have an elevated blood pressure.  At this time there is nothing to suggest hypertensive urgency or emergency.  Patient was advised to follow up with their PCP or Wellness clinic regarding their elevated blood pressure.   I personally performed the services described in this documentation, which was scribed in my presence. The recorded information has been reviewed and is accurate.   Final Clinical Impressions(s) / ED Diagnoses   Final diagnoses:  Foot pain, left  Acute gout involving toe of left foot, unspecified cause    New Prescriptions Discharge Medication List as of 04/16/2017 12:34 PM    START taking these medications   Details  indomethacin (INDOCIN) 50 MG capsule Take 1 capsule (50 mg total) by mouth 2 (two) times daily with a meal., Starting Sat 04/16/2017, Print         [name], PA-C 04/16/17 1820    [name], DO 04/17/17 [phone]

## 2017-04-16 NOTE — ED Triage Notes (Signed)
Patient complains of ongoing right foot 2nd and 3rd toe pain for several days. Worse with any ambulation. Denies trauma

## 2017-06-20 IMAGING — CR DG HIP (WITH OR WITHOUT PELVIS) 2-3V*R*
3 series · 3 of 3 positions shown · non-contrast
Comparison: Right hip radiographs 03/19/2015

CLINICAL DATA: Acute on chronic right hip pain.

EXAM:
DG HIP (WITH OR WITHOUT PELVIS) 2-3V RIGHT

[t pelvis ap]
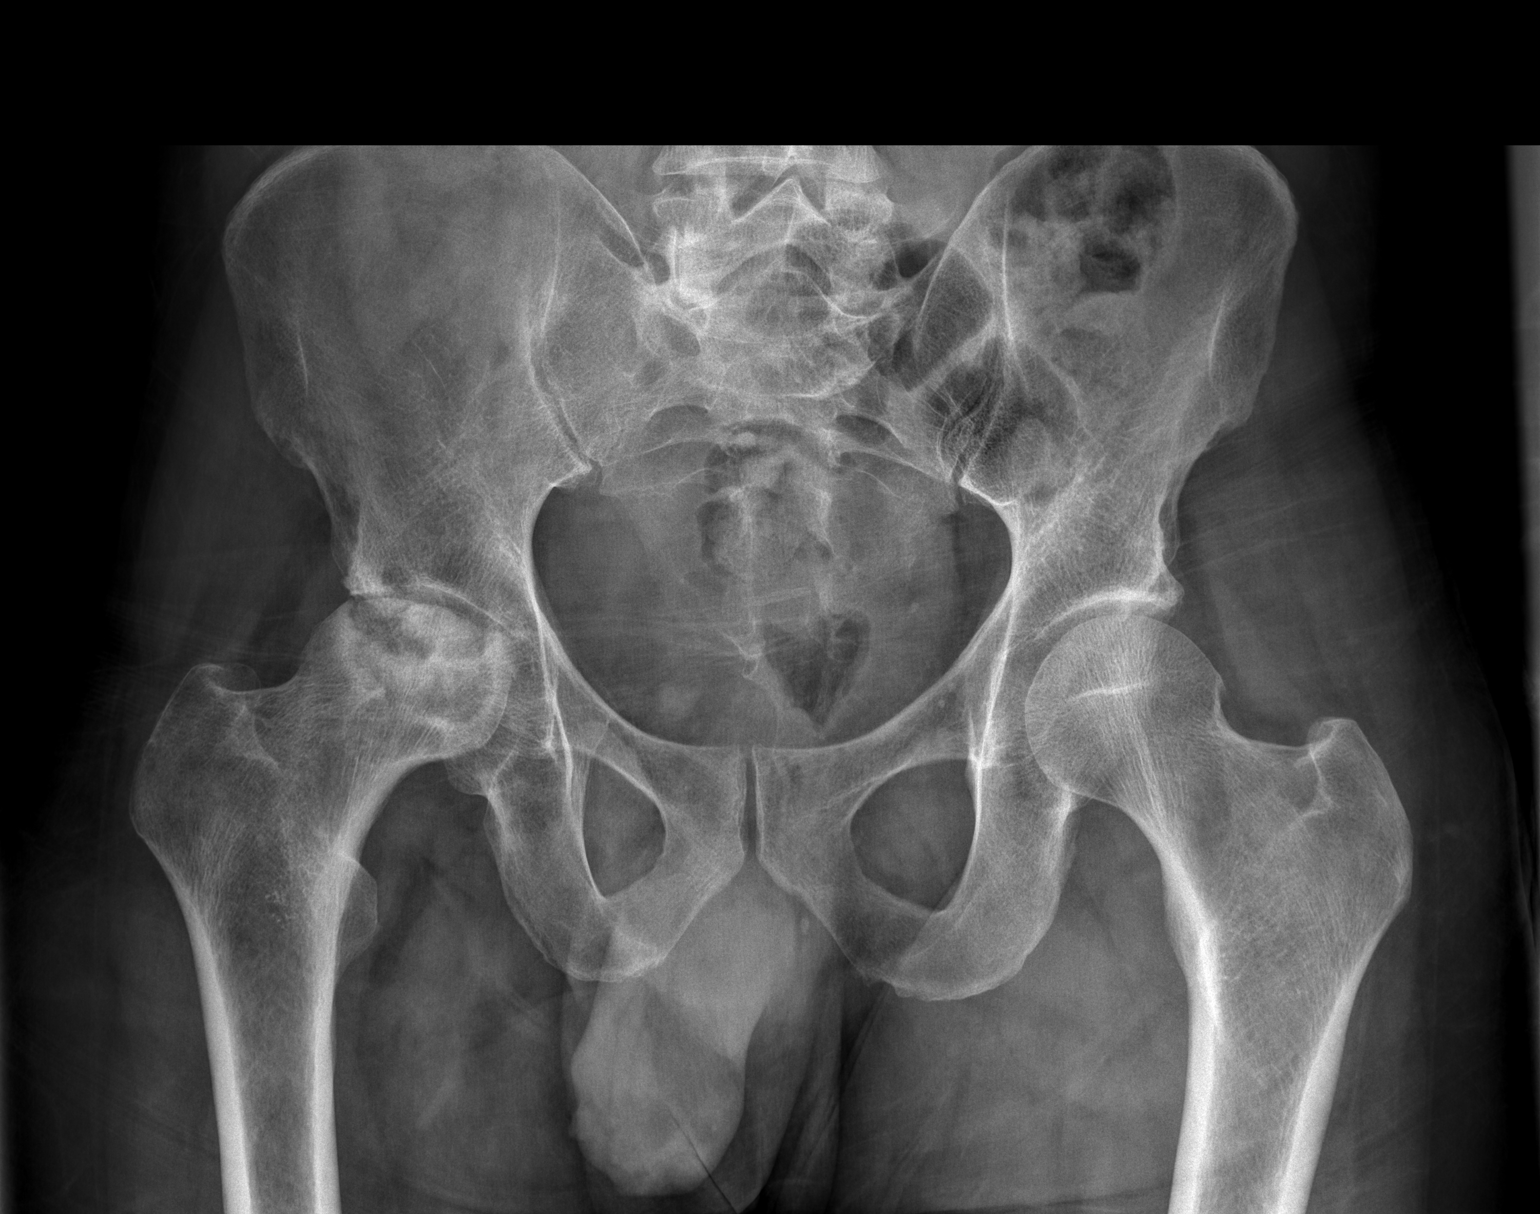

[t hip ap right]
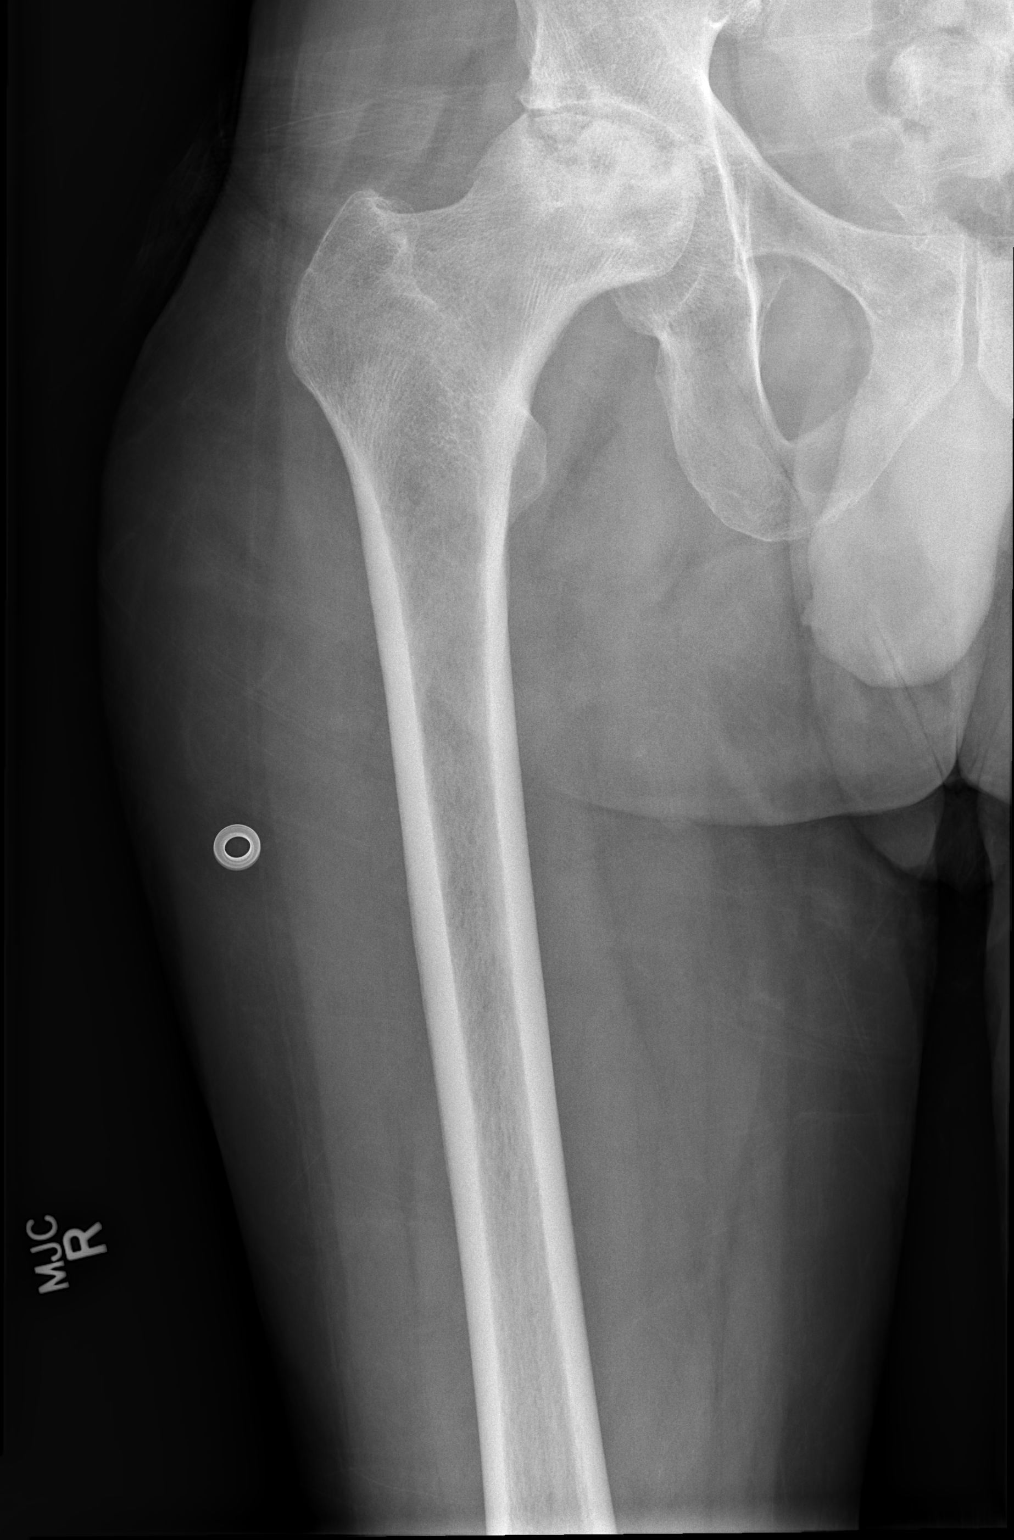

[t hip frog leg right]
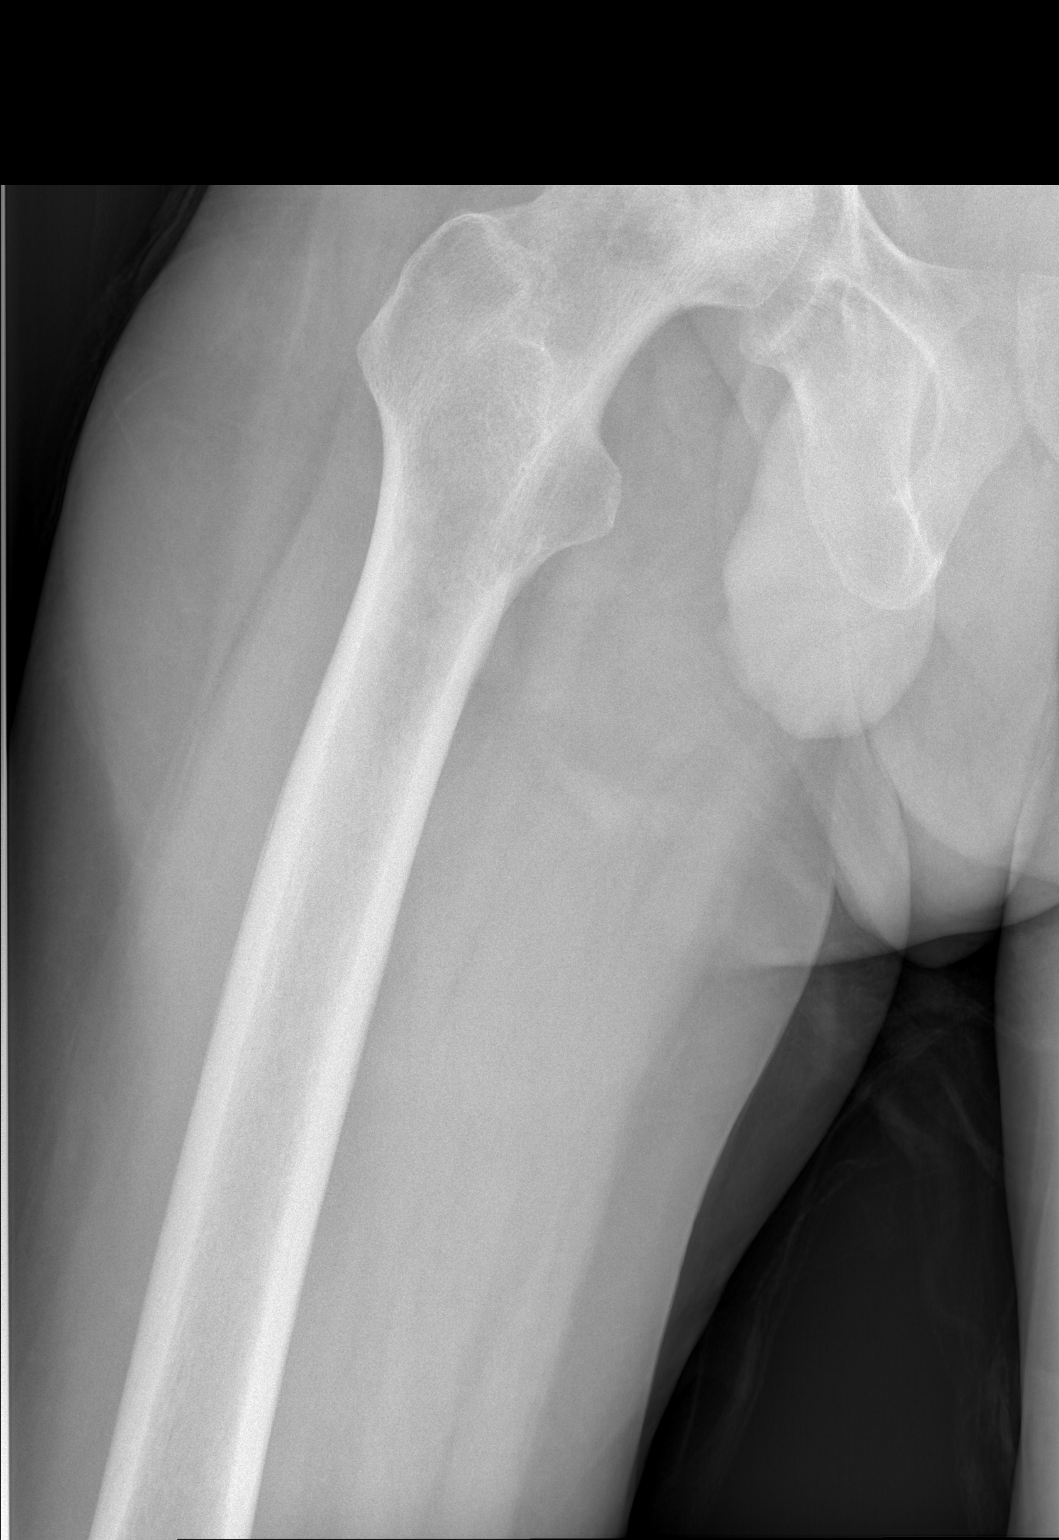

[3 of 3 positions shown; findings below may reference images not displayed]

FINDINGS: Avascular necrosis of the right femoral head with subchondral
collapse. The degree of subchondral collapse has progressed from
prior exam. There is associated right hip joint space narrowing and
subchondral change at the acetabulum. No acute fracture. The pubic
symphysis and sacroiliac joints are congruent.
IMPRESSION: Avascular necrosis of the right femoral head, the degree of
subchondral collapse has progressed from prior exam. No acute
fracture or dislocation.

## 2017-11-07 ENCOUNTER — Other Ambulatory Visit: Payer: Self-pay

## 2017-11-07 ENCOUNTER — Encounter (HOSPITAL_COMMUNITY): Payer: Self-pay | Admitting: Nurse Practitioner

## 2017-11-07 ENCOUNTER — Emergency Department (HOSPITAL_COMMUNITY)
Admission: EM | Admit: 2017-11-07 | Discharge: 2017-11-07 | Disposition: A | Discharge status: Home / Self Care | Payer: Medicaid Other | Attending: Emergency Medicine | Admitting: Emergency Medicine

## 2017-11-07 DIAGNOSIS — F32A Depression, unspecified: Secondary | ICD-10-CM

## 2017-11-07 DIAGNOSIS — F1090 Alcohol use, unspecified, uncomplicated: Secondary | ICD-10-CM

## 2017-11-07 DIAGNOSIS — I10 Essential (primary) hypertension: Secondary | ICD-10-CM | POA: Diagnosis not present

## 2017-11-07 DIAGNOSIS — Z789 Other specified health status: Secondary | ICD-10-CM

## 2017-11-07 DIAGNOSIS — F329 Major depressive disorder, single episode, unspecified: Secondary | ICD-10-CM | POA: Insufficient documentation

## 2017-11-07 DIAGNOSIS — F1010 Alcohol abuse, uncomplicated: Secondary | ICD-10-CM | POA: Diagnosis not present

## 2017-11-07 DIAGNOSIS — F17200 Nicotine dependence, unspecified, uncomplicated: Secondary | ICD-10-CM | POA: Insufficient documentation

## 2017-11-07 DIAGNOSIS — Z7982 Long term (current) use of aspirin: Secondary | ICD-10-CM | POA: Diagnosis not present

## 2017-11-07 LAB — CBC
HEMATOCRIT: 36.1 % — AB (ref 39.0–52.0)
HEMOGLOBIN: 12 g/dL — AB (ref 13.0–17.0)
MCH: 32.3 pg (ref 26.0–34.0)
MCHC: 33.2 g/dL (ref 30.0–36.0)
MCV: 97 fL (ref 78.0–100.0)
PLATELETS: 190 10*3/uL (ref 150–400)
RBC: 3.72 MIL/uL — AB (ref 4.22–5.81)
RDW: 13.4 % (ref 11.5–15.5)
WBC: 4.8 10*3/uL (ref 4.0–10.5)

## 2017-11-07 LAB — RAPID URINE DRUG SCREEN, HOSP PERFORMED
Amphetamines: NOT DETECTED
BARBITURATES: NOT DETECTED
BENZODIAZEPINES: NOT DETECTED
Cocaine: NOT DETECTED
Opiates: NOT DETECTED
Tetrahydrocannabinol: NOT DETECTED

## 2017-11-07 LAB — COMPREHENSIVE METABOLIC PANEL
ALBUMIN: 4.3 g/dL (ref 3.5–5.0)
ALT: 36 U/L (ref 17–63)
AST: 84 U/L — AB (ref 15–41)
Alkaline Phosphatase: 74 U/L (ref 38–126)
Anion gap: 8 (ref 5–15)
BUN: 7 mg/dL (ref 6–20)
CHLORIDE: 102 mmol/L (ref 101–111)
CO2: 26 mmol/L (ref 22–32)
Calcium: 9.7 mg/dL (ref 8.9–10.3)
Creatinine, Ser: 0.67 mg/dL (ref 0.61–1.24)
GFR calc Af Amer: >60 mL/min (ref >60)
GFR calc non Af Amer: >60 mL/min (ref >60)
GLUCOSE: 93 mg/dL (ref 65–99)
POTASSIUM: 3.5 mmol/L (ref 3.5–5.1)
Sodium: 136 mmol/L (ref 135–145)
Total Bilirubin: 1.1 mg/dL (ref 0.3–1.2)
Total Protein: 7.8 g/dL (ref 6.5–8.1)

## 2017-11-07 LAB — LIPASE, BLOOD: LIPASE: 23 U/L (ref 11–51)

## 2017-11-07 LAB — ETHANOL: Alcohol, Ethyl (B): 160 mg/dL — ABNORMAL HIGH (ref <10)

## 2017-11-07 LAB — ACETAMINOPHEN LEVEL: Acetaminophen (Tylenol), Serum: 10 ug/mL (ref 10–30)

## 2017-11-07 LAB — SALICYLATE LEVEL: Salicylate Lvl: <7 mg/dL (ref 2.8–30.0)

## 2017-11-07 NOTE — ED Provider Notes (Signed)
[facility] EMERGENCY DEPARTMENT Provider Note   CSN: [id number] Arrival date & time: 11/07/17  1831     History   Chief Complaint Chief Complaint  Patient presents with  . Medical Clearance    HPI Sean Mccoy is a 56 y.o. male.  HPI  Patient with history of anxiety, bipolar, schizophrenia comes in with chief complaint of depression.  Patient reports that he has not taken his psych meds in few months because he hated the side effects.  Patient came into the ER because he has been having depression that is worsening.  Patient has had some suicidal thoughts however currently he has no suicidal thoughts nor does he have a suicide plan.  Patient did not attempt to hurt himself even when he had the suicidal thoughts.  Patient also denies any hallucinations.  Patient admits to regular alcohol usage.  He denies any substance abuse.  Patient is currently homeless and living with friends. He admits to loss of family member recently, which has made his depression worse. Pt is to see [facility] for his primary care and has been thinking about going to [place].  Past Medical History:  Diagnosis Date  . Ankle injury   . Anxiety   . Arthritis   . Avascular necrosis (HCC)   . Bipolar 1 disorder (HCC)   . Depression   . Groin injury   . Hypertension   . Knee fracture   . PTSD (post-traumatic stress disorder)   . Schizophrenia [facility])     Patient Active Problem List   Diagnosis Date Noted  . Polysubstance (including opioids) dependence w/o physiol dependence (HCC) 09/11/2016  . Constipation due to pain medication therapy 02/17/2016  . Postoperative anemia due to acute blood loss 02/17/2016  . Osteoporosis 02/17/2016  . Right knee pain 02/17/2016  . Avascular necrosis of bone of right hip (HCC) 01/20/2016  . 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 Depression 11/29/2012  . Anxiety 11/29/2012  . Post traumatic stress disorder 11/29/2012  . Substance  induced mood disorder (HCC) 11/29/2012  . Alcohol abuse 11/29/2012    Past Surgical History:  Procedure Laterality Date  . KNEE SURGERY         Home Medications    Prior to Admission medications   Medication Sig Start Date End Date Taking? Authorizing Provider  alendronate (FOSAMAX) 70 MG tablet Take 1 tablet (70 mg total) by mouth every 7 (seven) days. Take with a full glass of water on an empty stomach. Patient not taking: Reported on 09/11/2016 02/17/16   [name], NP  aspirin EC 325 MG EC tablet Take 1 tablet (325 mg total) by mouth 2 (two) times daily after a meal. 01/23/16   Kathryne Hitch, MD  calcium carbonate (TUMS) 500 MG chewable tablet Chew 1 tablet (200 mg of elemental calcium total) by mouth 3 (three) times daily with meals. Patient taking differently: Chew 1 tablet by mouth 3 (three) times daily as needed for indigestion.  02/17/16   [name], NP  Cholecalciferol (VITAMIN D) 2000 units CAPS Take 1 capsule (2,000 Units total) by mouth daily. Patient not taking: Reported on 09/11/2016 02/17/16   [name], NP  ferrous sulfate 325 (65 FE) MG tablet Take 1 tablet (325 mg total) by mouth 2 (two) times daily with a meal. Patient not taking: Reported on 09/11/2016 01/26/16   [name], NP  indomethacin (INDOCIN) 50 MG capsule Take 1 capsule (50 mg  total) by mouth 2 (two) times daily with a meal. 04/16/17   [name], DO  Menthol, Topical Analgesic, (BIOFREEZE) 4 % GEL Apply 1 application topically 2 (two) times daily. AND twice daily as needed Patient taking differently: Apply 1 application topically 4 (four) times daily as needed. For pain. 02/17/16   [name], NP  naproxen (NAPROSYN) 375 MG tablet Take 1 tablet (375 mg total) by mouth 2 (two) times daily with a meal. Patient taking differently: Take 375 mg by mouth 2 (two) times daily as needed. For pain. 04/16/16   [name], PA-C  polyethylene glycol (MIRALAX / Ethelene Hal) packet Take 17  g by mouth daily as needed (for constipation.).     [provider]  sulfacetamide (BLEPH-10) 10 % ophthalmic solution Place 1-2 drops into the right eye every 2 (two) hours while awake. 10/06/16   Teressa Lower, NP    Family History Family History  Problem Relation Age of Onset  . Diabetes Mother   . Hypertension Mother   . Diabetes Father   . Hypertension Father     Social History Social History   Tobacco Use  . Smoking status: Light Tobacco Smoker  . Smokeless tobacco: Never Used  Substance Use Topics  . Alcohol use: Yes    Alcohol/week: 1.8 oz    Types: 3 Cans of beer per week    Comment: daily   . Drug use: No     Allergies   Patient has no known allergies.   Review of Systems Review of Systems  Constitutional: Positive for activity change.  Respiratory: Negative for chest tightness and shortness of breath.   Cardiovascular: Negative for chest pain.  Gastrointestinal: Negative for abdominal pain.  Psychiatric/Behavioral: Positive for suicidal ideas. Negative for agitation, confusion, hallucinations, self-injury and sleep disturbance.     Physical Exam Updated Vital Signs BP 130/85 (BP Location: Right Arm)   Pulse 91   Temp 98.3 F (36.8 C) (Oral)   Resp 16   Ht 5\' 10"  (1.778 m)   Wt 78 kg (172 lb)   SpO2 99%   BMI 24.68 kg/m   Physical Exam  Constitutional: He is oriented to person, place, and time. He appears well-developed.  HENT:  Head: Atraumatic.  Neck: Neck supple.  Cardiovascular: Normal rate.  Pulmonary/Chest: Effort normal.  Neurological: He is alert and oriented to person, place, and time.  Skin: Skin is warm.  Psychiatric:  Depressed affect  Nursing note and vitals reviewed.    ED Treatments / Results  Labs (all labs ordered are listed, but only abnormal results are displayed) Labs Reviewed  COMPREHENSIVE METABOLIC PANEL - Abnormal; Notable for the following components:      Result Value   AST 84 (*)    All  other components within normal limits  ETHANOL - Abnormal; Notable for the following components:   Alcohol, Ethyl (B) 160 (*)    All other components within normal limits  CBC - Abnormal; Notable for the following components:   RBC 3.72 (*)    Hemoglobin 12.0 (*)    HCT 36.1 (*)    All other components within normal limits  SALICYLATE LEVEL  ACETAMINOPHEN LEVEL  RAPID URINE DRUG SCREEN, HOSP PERFORMED  LIPASE, BLOOD    EKG  EKG Interpretation None       Radiology No results found.  Procedures Procedures (including critical care time)  Medications Ordered in ED Medications - No data to display   Initial Impression / Assessment and  Plan / ED Course  I have reviewed the triage vital signs and the nursing notes.  Pertinent labs & imaging results that were available during my care of the patient were reviewed by me and considered in my medical decision making (see chart for details).     Patient comes in with chief complaint of worsening depression.  Patient has other mental disorders as well.  Patient has not been taking his medications as prescribed.  On my evaluation patient denies any active suicidal thoughts, suicide attempt, or even suicide plan. Patient denies any substance abuse, but does admit to regular alcohol use.  Patient is currently homeless and is living with friends and he had recent loss of his brother.  I asked patient in multiple different ways if he had active suicidal thoughts and he denied.  I also reviewed the triage note and confronted patient about the triage findings where patient mentioned suicidal thoughts.  Patient states that he has had thoughts in the past but currently he is having no thoughts of suicide.  Basic labs are normal besides elevated alcohol level. I do not see patient requiring inpatient psych evaluation at this time.  Patient has been to [facility] and understands that [facility] has walk-in clinic every day.  I been advised him to see  [facility] as soon as possible early in the morning tomorrow.  Strict ER return precautions have been discussed, and patient is agreeing with the plan and is comfortable with the workup done and the recommendations from the ER.   Final Clinical Impressions(s) / ED Diagnoses   Final diagnoses:  Depression, unspecified depression type  Heavy alcohol use    ED Discharge Orders    None       Derwood Kaplan, MD 11/07/17 2342

## 2017-11-07 NOTE — ED Notes (Signed)
Patient verbalizes understanding of discharge instructions. Opportunity for questioning and answers were provided. 

## 2017-11-07 NOTE — Discharge Instructions (Signed)
Please see the behavioral team at [facility] quickly. Please [facility] for medical care as well and for preventative care.  Results in the ER are reassuring.

## 2017-11-07 NOTE — ED Triage Notes (Signed)
Sharman Crate [phone] and husband Minister Vernie Shanks [phone] are the contact numbers for this patient.

## 2017-11-07 NOTE — ED Triage Notes (Signed)
Pt presents with family friend for evaluation of hallucinations, SI, and nausea. Patient has been off of his psychiatric medications for over a month and had stopped following up with [facility]. Patient denies hallucinations and SI but sts he has been having nightmares and insomnia. Friends at bedside states patient endorses SI thoughts to them due to recent loss of brother, and homelessness. Pt is staying with friends rn. Pt denies SI plan- pt sts "I dont want to kill myself of anyone else I was just in a deep deep depression."

## 2017-12-06 ENCOUNTER — Ambulatory Visit: Payer: Self-pay | Admitting: Internal Medicine

## 2018-01-02 ENCOUNTER — Ambulatory Visit: Payer: Self-pay | Admitting: Internal Medicine

## 2018-03-14 ENCOUNTER — Encounter (HOSPITAL_COMMUNITY): Payer: Self-pay | Admitting: Emergency Medicine

## 2018-03-14 DIAGNOSIS — R109 Unspecified abdominal pain: Secondary | ICD-10-CM | POA: Diagnosis not present

## 2018-03-14 DIAGNOSIS — F17290 Nicotine dependence, other tobacco product, uncomplicated: Secondary | ICD-10-CM | POA: Diagnosis not present

## 2018-03-14 DIAGNOSIS — Z7982 Long term (current) use of aspirin: Secondary | ICD-10-CM | POA: Insufficient documentation

## 2018-03-14 DIAGNOSIS — Z96641 Presence of right artificial hip joint: Secondary | ICD-10-CM | POA: Insufficient documentation

## 2018-03-14 DIAGNOSIS — R197 Diarrhea, unspecified: Secondary | ICD-10-CM | POA: Diagnosis not present

## 2018-03-14 DIAGNOSIS — R112 Nausea with vomiting, unspecified: Secondary | ICD-10-CM | POA: Diagnosis not present

## 2018-03-14 DIAGNOSIS — I10 Essential (primary) hypertension: Secondary | ICD-10-CM | POA: Insufficient documentation

## 2018-03-14 DIAGNOSIS — Z79899 Other long term (current) drug therapy: Secondary | ICD-10-CM | POA: Insufficient documentation

## 2018-03-14 LAB — ETHANOL: Alcohol, Ethyl (B): 305 mg/dL (ref <10)

## 2018-03-14 LAB — COMPREHENSIVE METABOLIC PANEL
ALBUMIN: 4.3 g/dL (ref 3.5–5.0)
ALT: 33 U/L (ref 17–63)
AST: 104 U/L — AB (ref 15–41)
Alkaline Phosphatase: 89 U/L (ref 38–126)
Anion gap: 16 — ABNORMAL HIGH (ref 5–15)
BUN: 9 mg/dL (ref 6–20)
CHLORIDE: 102 mmol/L (ref 101–111)
CO2: 21 mmol/L — AB (ref 22–32)
CREATININE: 0.75 mg/dL (ref 0.61–1.24)
Calcium: 9.8 mg/dL (ref 8.9–10.3)
GFR calc non Af Amer: >60 mL/min (ref >60)
GLUCOSE: 98 mg/dL (ref 65–99)
Potassium: 3.9 mmol/L (ref 3.5–5.1)
SODIUM: 139 mmol/L (ref 135–145)
Total Bilirubin: 0.7 mg/dL (ref 0.3–1.2)
Total Protein: 8.6 g/dL — ABNORMAL HIGH (ref 6.5–8.1)

## 2018-03-14 LAB — CBC
HEMATOCRIT: 38 % — AB (ref 39.0–52.0)
Hemoglobin: 12.9 g/dL — ABNORMAL LOW (ref 13.0–17.0)
MCH: 32.2 pg (ref 26.0–34.0)
MCHC: 33.9 g/dL (ref 30.0–36.0)
MCV: 94.8 fL (ref 78.0–100.0)
Platelets: 253 10*3/uL (ref 150–400)
RBC: 4.01 MIL/uL — ABNORMAL LOW (ref 4.22–5.81)
RDW: 13.8 % (ref 11.5–15.5)
WBC: 4.4 10*3/uL (ref 4.0–10.5)

## 2018-03-14 LAB — RAPID URINE DRUG SCREEN, HOSP PERFORMED
Amphetamines: NOT DETECTED
BARBITURATES: NOT DETECTED
BENZODIAZEPINES: NOT DETECTED
Cocaine: NOT DETECTED
Opiates: NOT DETECTED
Tetrahydrocannabinol: NOT DETECTED

## 2018-03-14 NOTE — ED Triage Notes (Signed)
Pt reports he has been out of meds X several weeks after his PCP had his license revoked (Dr. Ronne Binning) Pt reports he has gen pain, fatigue, diarrhea d/t withdrawal from his oxycodone.

## 2018-03-15 ENCOUNTER — Emergency Department (HOSPITAL_COMMUNITY)
Admission: EM | Admit: 2018-03-15 | Discharge: 2018-03-15 | Disposition: A | Discharge status: Home / Self Care | Payer: Medicaid Other | Attending: Emergency Medicine | Admitting: Emergency Medicine

## 2018-03-15 DIAGNOSIS — R197 Diarrhea, unspecified: Secondary | ICD-10-CM

## 2018-03-15 DIAGNOSIS — R112 Nausea with vomiting, unspecified: Secondary | ICD-10-CM

## 2018-03-15 DIAGNOSIS — R109 Unspecified abdominal pain: Secondary | ICD-10-CM

## 2018-03-15 MED ORDER — DICYCLOMINE HCL 20 MG PO TABS: 20.0000 mg | ORAL_TABLET | Freq: Two times a day (BID) | ORAL | 0 refills | Status: DC

## 2018-03-15 MED ORDER — DICYCLOMINE HCL 10 MG PO CAPS
10.0000 mg | ORAL_CAPSULE | Freq: Once | ORAL | Status: AC
Start: 2018-03-15 — End: 2018-03-15
  Administered 2018-03-15: 10 mg via ORAL
  Filled 2018-03-15: qty 1

## 2018-03-15 MED ORDER — LOPERAMIDE HCL 2 MG PO CAPS: 2.0000 mg | ORAL_CAPSULE | Freq: Four times a day (QID) | ORAL | 0 refills | Status: DC | PRN

## 2018-03-15 MED ORDER — ONDANSETRON 4 MG PO TBDP
4.0000 mg | ORAL_TABLET | Freq: Once | ORAL | Status: AC
Start: 2018-03-15 — End: 2018-03-15
  Administered 2018-03-15: 4 mg via ORAL
  Filled 2018-03-15: qty 1

## 2018-03-15 MED ORDER — ONDANSETRON 4 MG PO TBDP: 4.0000 mg | ORAL_TABLET | Freq: Three times a day (TID) | ORAL | 0 refills | Status: DC | PRN

## 2018-03-15 MED ORDER — LOPERAMIDE HCL 2 MG PO CAPS
2.0000 mg | ORAL_CAPSULE | Freq: Once | ORAL | Status: AC
  Administered 2018-03-15: 2 mg via ORAL
  Filled 2018-03-15: qty 1

## 2018-03-15 NOTE — ED Provider Notes (Signed)
[facility] EMERGENCY DEPARTMENT Provider Note   CSN: [id number] Arrival date & time: 03/14/18  2040     History   Chief Complaint Chief Complaint  Patient presents with  . Withdrawal    HPI Montrez Marietta is a 57 y.o. male.  The history is provided by the patient and medical records.     57 year old male with history of anxiety, arthritis, avascular necrosis of the hip, bipolar disorder, depression, hypertension, PTSD, schizophrenia, chronic pain, presenting to the ED with concern of withdrawal from opiates.  His primary care doctor, Dr. Ronne Binning, recently lost his medical license and practice was closed.  States he has not yet established with a new primary care doctor.  He has been off of his pain medication for about 3 weeks now.  States initially he felt okay but has been feeling worse over the past few days.  Reports upset stomach, nausea, and diarrhea mostly.  He denies any fever or chills.  Unfortunately, he has been supplementing with alcohol in order to help try manage his pain.  He denies any illicit drug use.  He reports this seems to be worsening his anxiety and depressive type symptoms.  He denies any suicidal homicidal ideation.  No hallucinations.  States at one point he did see pain management but they were "very strict" and he did not like the regimen he was on.  He was seeing Dr. Ronne Binning for about 3 years.  Past Medical History:  Diagnosis Date  . Ankle injury   . Anxiety   . Arthritis   . Avascular necrosis (HCC)   . Bipolar 1 disorder (HCC)   . Depression   . Groin injury   . Hypertension   . Knee fracture   . PTSD (post-traumatic stress disorder)   . Schizophrenia [facility])     Patient Active Problem List   Diagnosis Date Noted  . Polysubstance (including opioids) dependence w/o physiol dependence (HCC) 09/11/2016  . Constipation due to pain medication therapy 02/17/2016  . Postoperative anemia due to acute blood loss 02/17/2016  .  Osteoporosis 02/17/2016  . Right knee pain 02/17/2016  . Avascular necrosis of bone of right hip (HCC) 01/20/2016  . 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 Depression 11/29/2012  . Anxiety 11/29/2012  . Post traumatic stress disorder 11/29/2012  . Substance induced mood disorder (HCC) 11/29/2012  . Alcohol abuse 11/29/2012    Past Surgical History:  Procedure Laterality Date  . KNEE SURGERY    . TOTAL HIP ARTHROPLASTY Right 01/20/2016   Procedure: RIGHT TOTAL HIP ARTHROPLASTY ANTERIOR APPROACH;  Surgeon: Kathryne Hitch, MD;  Location: [facility] OR;  Service: Orthopedics;  Laterality: Right;       Home Medications    Prior to Admission medications   Medication Sig Start Date End Date Taking? Authorizing Provider  alendronate (FOSAMAX) 70 MG tablet Take 1 tablet (70 mg total) by mouth every 7 (seven) days. Take with a full glass of water on an empty stomach. Patient not taking: Reported on 09/11/2016 02/17/16   [name], NP  aspirin EC 325 MG EC tablet Take 1 tablet (325 mg total) by mouth 2 (two) times daily after a meal. 01/23/16   Kathryne Hitch, MD  calcium carbonate (TUMS) 500 MG chewable tablet Chew 1 tablet (200 mg of elemental calcium total) by mouth 3 (three) times daily with meals. Patient taking differently: Chew 1 tablet by mouth 3 (three) times daily as needed for indigestion.  02/17/16   [name], NP  Cholecalciferol (VITAMIN D) 2000 units CAPS Take 1 capsule (2,000 Units total) by mouth daily. Patient not taking: Reported on 09/11/2016 02/17/16   [name], NP  ferrous sulfate 325 (65 FE) MG tablet Take 1 tablet (325 mg total) by mouth 2 (two) times daily with a meal. Patient not taking: Reported on 09/11/2016 01/26/16   [name], NP  indomethacin (INDOCIN) 50 MG capsule Take 1 capsule (50 mg total) by mouth 2 (two) times daily with a meal. 04/16/17   [name], DO  Menthol, Topical Analgesic, (BIOFREEZE) 4 % GEL Apply  1 application topically 2 (two) times daily. AND twice daily as needed Patient taking differently: Apply 1 application topically 4 (four) times daily as needed. For pain. 02/17/16   [name], NP  naproxen (NAPROSYN) 375 MG tablet Take 1 tablet (375 mg total) by mouth 2 (two) times daily with a meal. Patient taking differently: Take 375 mg by mouth 2 (two) times daily as needed. For pain. 04/16/16   [name], PA-C  polyethylene glycol (MIRALAX / Ethelene Hal) packet Take 17 g by mouth daily as needed (for constipation.).     [provider]  sulfacetamide (BLEPH-10) 10 % ophthalmic solution Place 1-2 drops into the right eye every 2 (two) hours while awake. 10/06/16   Teressa Lower, NP    Family History Family History  Problem Relation Age of Onset  . Diabetes Mother   . Hypertension Mother   . Diabetes Father   . Hypertension Father     Social History Social History   Tobacco Use  . Smoking status: Light Tobacco Smoker  . Smokeless tobacco: Never Used  Substance Use Topics  . Alcohol use: Yes    Alcohol/week: 1.8 oz    Types: 3 Cans of beer per week    Comment: daily   . Drug use: No     Allergies   Patient has no known allergies.   Review of Systems Review of Systems  Gastrointestinal: Positive for abdominal pain, diarrhea, nausea and vomiting.  All other systems reviewed and are negative.    Physical Exam Updated Vital Signs BP (!) 170/101 (BP Location: Left Arm)   Pulse (!) 102   Resp 18   Ht 6' (1.829 m)   Wt 77.1 kg (170 lb)   SpO2 100%   BMI 23.06 kg/m   Physical Exam  Constitutional: He is oriented to person, place, and time. He appears well-developed and well-nourished.  Drinking juice on exam, NAD  HENT:  Head: Normocephalic and atraumatic.  Mouth/Throat: Oropharynx is clear and moist.  Eyes: Conjunctivae and EOM are normal. Pupils are equal, round, and reactive to light.  Neck: Normal range of motion.  Cardiovascular: Normal  rate, regular rhythm and normal heart sounds.  Pulmonary/Chest: Effort normal and breath sounds normal.  Abdominal: Soft. Bowel sounds are normal. There is no tenderness. There is no rigidity and no guarding.  Soft, benign  Musculoskeletal: Normal range of motion.  Neurological: He is alert and oriented to person, place, and time. He displays no tremor.  AAOx3, answering questions and following commands appropriately; equal strength UE and LE bilaterally; CN grossly intact; moves all extremities appropriately without ataxia; no focal neuro deficits or facial asymmetry appreciated, no tremors or seizure activity  Skin: Skin is warm and dry.  Dry, no sweating observed  Psychiatric: He has a normal mood and affect. He is not actively hallucinating. He expresses no  homicidal and no suicidal ideation. He expresses no suicidal plans and no homicidal plans.  Nursing note and vitals reviewed.    ED Treatments / Results  Labs (all labs ordered are listed, but only abnormal results are displayed) Labs Reviewed  COMPREHENSIVE METABOLIC PANEL - Abnormal; Notable for the following components:      Result Value   CO2 21 (*)    Total Protein 8.6 (*)    AST 104 (*)    Anion gap 16 (*)    All other components within normal limits  ETHANOL - Abnormal; Notable for the following components:   Alcohol, Ethyl (B) 305 (*)    All other components within normal limits  CBC - Abnormal; Notable for the following components:   RBC 4.01 (*)    Hemoglobin 12.9 (*)    HCT 38.0 (*)    All other components within normal limits  RAPID URINE DRUG SCREEN, HOSP PERFORMED    EKG  EKG Interpretation None       Radiology No results found.  Procedures Procedures (including critical care time)  Medications Ordered in ED Medications  dicyclomine (BENTYL) capsule 10 mg (10 mg Oral Given 03/15/18 0515)  ondansetron (ZOFRAN-ODT) disintegrating tablet 4 mg (4 mg Oral Given 03/15/18 0515)  loperamide (IMODIUM)  capsule 2 mg (2 mg Oral Given 03/15/18 0515)     Initial Impression / Assessment and Plan / ED Course  I have reviewed the triage vital signs and the nursing notes.  Pertinent labs & imaging results that were available during my care of the patient were reviewed by me and considered in my medical decision making (see chart for details).  57 year old male presenting to the ED with concern of opiate withdrawal.  Has been on chronic opiates for the past 3 years prescribed by his primary care doctor who recently lost her medical license.  He has been off of opiates for approximately 3 weeks now.  Has been supplementing with alcohol because of this.  On exam here he is sitting up in bed, drinking juice in a bottle that he pulled out of his backpack.  He does not have any tremors or seizure activity.  He is not diaphoretic.  His vitals are stable.  Labs obtained from triage with ethanol of 305, otherwise largely reassuring.  These were drawn several hours ago.  Patient is clinically sober at this time.  He does not appear to be in acute withdrawal.  Does report this is worsening his anxiety/depression but denies any SI/HI/AVH at this time.  He appears to have a clear head and does not seem to be a danger to himself or others at this time.  Do not feel he needs emergent psychiatric evaluation.  He was given OP resources for counseling/substance abuse.  Patient states he feels like he needs to go back on his pain medication, he is aware that we cannot represcribe these from the emergency department.  He will be referred to local pain management clinic for follow-up with this.  Will treat his GI symptoms with Zofran, Bentyl, Imodium as needed.  Encourage oral fluids.  He will also need to establish care with new PCP.  Discussed plan with patient, he acknowledged understanding and agreed with plan of care.  Return precautions given for new or worsening symptoms.  Final Clinical Impressions(s) / ED Diagnoses    Final diagnoses:  Non-intractable vomiting with nausea, unspecified vomiting type  Diarrhea, unspecified type  Abdominal discomfort    ED Discharge Orders  None       Garlon Hatchet, PA-C 03/15/18 [id number]    Shon Baton, MD 03/15/18 [phone]

## 2018-03-15 NOTE — ED Notes (Signed)
No reply x3 for vitals 

## 2018-03-15 NOTE — Discharge Instructions (Signed)
Take the prescribed medication as directed-- these can help try and control your symptoms. If you want to get back on your medications, you likely will need to establish care with pain management clinic.  I have attached information for local clinics in our area for that as well as information for substance abuse centers/counseling if you feel you need them. You will need to establish care with new primary care doctor.  You can call the number on the paperwork to get help finding a new clinic. Return to the ED for new or worsening symptoms.

## 2018-03-15 NOTE — ED Notes (Signed)
Patient came to desk to ask where they were on list, updated vitals

## 2018-03-15 NOTE — ED Notes (Signed)
E-signature pad unavailable; Patient verbalized understanding of discharge.

## 2018-03-20 ENCOUNTER — Ambulatory Visit: Payer: Self-pay

## 2018-05-19 ENCOUNTER — Encounter (HOSPITAL_COMMUNITY): Payer: Self-pay | Admitting: Emergency Medicine

## 2018-05-19 ENCOUNTER — Emergency Department (HOSPITAL_COMMUNITY)
Admission: EM | Admit: 2018-05-19 | Discharge: 2018-05-19 | Disposition: A | Discharge status: Home / Self Care | Payer: Medicaid Other | Attending: Emergency Medicine | Admitting: Emergency Medicine

## 2018-05-19 ENCOUNTER — Emergency Department (HOSPITAL_COMMUNITY): Payer: Medicaid Other

## 2018-05-19 ENCOUNTER — Other Ambulatory Visit: Payer: Self-pay

## 2018-05-19 DIAGNOSIS — I10 Essential (primary) hypertension: Secondary | ICD-10-CM | POA: Insufficient documentation

## 2018-05-19 DIAGNOSIS — Y908 Blood alcohol level of 240 mg/100 ml or more: Secondary | ICD-10-CM | POA: Diagnosis not present

## 2018-05-19 DIAGNOSIS — F17210 Nicotine dependence, cigarettes, uncomplicated: Secondary | ICD-10-CM | POA: Diagnosis not present

## 2018-05-19 DIAGNOSIS — R55 Syncope and collapse: Secondary | ICD-10-CM | POA: Diagnosis not present

## 2018-05-19 DIAGNOSIS — Z79899 Other long term (current) drug therapy: Secondary | ICD-10-CM | POA: Diagnosis not present

## 2018-05-19 DIAGNOSIS — E860 Dehydration: Secondary | ICD-10-CM | POA: Insufficient documentation

## 2018-05-19 DIAGNOSIS — Z7982 Long term (current) use of aspirin: Secondary | ICD-10-CM | POA: Diagnosis not present

## 2018-05-19 DIAGNOSIS — R42 Dizziness and giddiness: Secondary | ICD-10-CM | POA: Diagnosis not present

## 2018-05-19 DIAGNOSIS — F10920 Alcohol use, unspecified with intoxication, uncomplicated: Secondary | ICD-10-CM | POA: Diagnosis not present

## 2018-05-19 LAB — COMPREHENSIVE METABOLIC PANEL
ALBUMIN: 4 g/dL (ref 3.5–5.0)
ALK PHOS: 77 U/L (ref 38–126)
ALT: 53 U/L (ref 17–63)
AST: 116 U/L — ABNORMAL HIGH (ref 15–41)
Anion gap: 15 (ref 5–15)
BILIRUBIN TOTAL: 0.6 mg/dL (ref 0.3–1.2)
BUN: 11 mg/dL (ref 6–20)
CALCIUM: 9.2 mg/dL (ref 8.9–10.3)
CO2: 22 mmol/L (ref 22–32)
CREATININE: 0.66 mg/dL (ref 0.61–1.24)
Chloride: 105 mmol/L (ref 101–111)
GFR calc non Af Amer: >60 mL/min (ref >60)
GLUCOSE: 92 mg/dL (ref 65–99)
Potassium: 4.1 mmol/L (ref 3.5–5.1)
Sodium: 142 mmol/L (ref 135–145)
TOTAL PROTEIN: 7.9 g/dL (ref 6.5–8.1)

## 2018-05-19 LAB — CBC WITH DIFFERENTIAL/PLATELET
BASOS PCT: 1 %
Basophils Absolute: 0 10*3/uL (ref 0.0–0.1)
EOS PCT: 1 %
Eosinophils Absolute: 0 10*3/uL (ref 0.0–0.7)
HEMATOCRIT: 32 % — AB (ref 39.0–52.0)
HEMOGLOBIN: 10.9 g/dL — AB (ref 13.0–17.0)
Lymphocytes Relative: 55 %
Lymphs Abs: 1.9 10*3/uL (ref 0.7–4.0)
MCH: 32.5 pg (ref 26.0–34.0)
MCHC: 34.1 g/dL (ref 30.0–36.0)
MCV: 95.5 fL (ref 78.0–100.0)
MONOS PCT: 17 %
Monocytes Absolute: 0.6 10*3/uL (ref 0.1–1.0)
NEUTROS PCT: 26 %
Neutro Abs: 0.9 10*3/uL — ABNORMAL LOW (ref 1.7–7.7)
Platelets: 186 10*3/uL (ref 150–400)
RBC: 3.35 MIL/uL — ABNORMAL LOW (ref 4.22–5.81)
RDW: 15.3 % (ref 11.5–15.5)
WBC: 3.4 10*3/uL — AB (ref 4.0–10.5)

## 2018-05-19 LAB — URINALYSIS, ROUTINE W REFLEX MICROSCOPIC
Bilirubin Urine: NEGATIVE
Glucose, UA: NEGATIVE mg/dL
Ketones, ur: NEGATIVE mg/dL
Leukocytes, UA: NEGATIVE
Nitrite: NEGATIVE
PROTEIN: NEGATIVE mg/dL
Specific Gravity, Urine: 1.005 (ref 1.005–1.030)
pH: 5 (ref 5.0–8.0)

## 2018-05-19 LAB — RAPID URINE DRUG SCREEN, HOSP PERFORMED
Amphetamines: NOT DETECTED
BENZODIAZEPINES: NOT DETECTED
Barbiturates: NOT DETECTED
COCAINE: NOT DETECTED
OPIATES: NOT DETECTED
Tetrahydrocannabinol: NOT DETECTED

## 2018-05-19 LAB — MAGNESIUM: Magnesium: 1.9 mg/dL (ref 1.7–2.4)

## 2018-05-19 LAB — CK: Total CK: 163 U/L (ref 49–397)

## 2018-05-19 LAB — ETHANOL: Alcohol, Ethyl (B): 349 mg/dL (ref <10)

## 2018-05-19 LAB — TSH: TSH: 0.847 u[IU]/mL (ref 0.350–4.500)

## 2018-05-19 MED ORDER — SODIUM CHLORIDE 0.9 % IV BOLUS
1000.0000 mL | Freq: Once | INTRAVENOUS | Status: AC
  Administered 2018-05-19: 1000 mL via INTRAVENOUS

## 2018-05-19 NOTE — ED Notes (Signed)
ETOH 349

## 2018-05-19 NOTE — ED Notes (Signed)
Pt ambulated under his own power without difficulty out of ED.

## 2018-05-19 NOTE — ED Triage Notes (Addendum)
Per EMS, walking around today and felt dizzy. Has been outside for long hours at work. 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 Says he passed out earlier in the day. Also says he has been off depression meds for awhile and is feeling a great deal of sadness.

## 2018-05-19 NOTE — ED Notes (Signed)
Patient bag is labeled and in cabinets labels 9-12

## 2018-05-19 NOTE — ED Notes (Signed)
Patient is drinking something from his bag.

## 2018-05-19 NOTE — Discharge Instructions (Addendum)
Your work-up today was overall reassuring aside from the continued elevation of some of your liver function.  Your alcohol was elevated and we suspect this is contributing to the dehydration and fatigue that you felt today prompting you to have your outside nap.  You have had improvement in your appearance after fluids and monitoring in the emergency department.  Please follow-up with a primary care physician for reassessment and further management the next few days.  Please stay hydrated.  Please stay out of the sun.  If any symptoms change or worsen, please return to the nearest emergency department.

## 2018-05-19 NOTE — ED Provider Notes (Signed)
[facility]-EMERGENCY DEPT Provider Note   CSN: [id number] Arrival date & time: 05/19/18  1441     History   Chief Complaint Chief Complaint  Patient presents with  . Near Syncope    HPI Sean Mccoy is a 57 y.o. male.  The history is provided by the patient and medical records. No language interpreter was used.  Near Syncope  This is a new problem. The current episode started 3 to 5 hours ago. The problem occurs rarely. The problem has been resolved. Pertinent negatives include no chest pain, no abdominal pain, no headaches and no shortness of breath. Nothing aggravates the symptoms. Nothing relieves the symptoms. He has tried nothing for the symptoms. The treatment provided no relief.    Past Medical History:  Diagnosis Date  . Ankle injury   . Anxiety   . Arthritis   . Avascular necrosis (HCC)   . Bipolar 1 disorder (HCC)   . Depression   . Groin injury   . Hypertension   . Knee fracture   . PTSD (post-traumatic stress disorder)   . Schizophrenia [facility])     Patient Active Problem List   Diagnosis Date Noted  . 'light-for-dates' infant with signs of fetal malnutrition 03/20/2018  . Polysubstance (including opioids) dependence w/o physiol dependence (HCC) 09/11/2016  . Constipation due to pain medication therapy 02/17/2016  . Postoperative anemia due to acute blood loss 02/17/2016  . Osteoporosis 02/17/2016  . Right knee pain 02/17/2016  . Avascular necrosis of bone of right hip (HCC) 01/20/2016  . 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 Depression 11/29/2012  . Anxiety 11/29/2012  . Post traumatic stress disorder 11/29/2012  . Substance induced mood disorder (HCC) 11/29/2012  . Alcohol abuse 11/29/2012    Past Surgical History:  Procedure Laterality Date  . KNEE SURGERY    . TOTAL HIP ARTHROPLASTY Right 01/20/2016   Procedure: RIGHT TOTAL HIP ARTHROPLASTY ANTERIOR APPROACH;  Surgeon: Kathryne Hitch, MD;   Location: [facility] OR;  Service: Orthopedics;  Laterality: Right;        Home Medications    Prior to Admission medications   Medication Sig Start Date End Date Taking? Authorizing Provider  alendronate (FOSAMAX) 70 MG tablet Take 1 tablet (70 mg total) by mouth every 7 (seven) days. Take with a full glass of water on an empty stomach. Patient not taking: Reported on 09/11/2016 02/17/16   [name], NP  aspirin EC 325 MG EC tablet Take 1 tablet (325 mg total) by mouth 2 (two) times daily after a meal. 01/23/16   Kathryne Hitch, MD  calcium carbonate (TUMS) 500 MG chewable tablet Chew 1 tablet (200 mg of elemental calcium total) by mouth 3 (three) times daily with meals. Patient taking differently: Chew 1 tablet by mouth 3 (three) times daily as needed for indigestion.  02/17/16   [name], NP  Cholecalciferol (VITAMIN D) 2000 units CAPS Take 1 capsule (2,000 Units total) by mouth daily. Patient not taking: Reported on 09/11/2016 02/17/16   [name], NP  dicyclomine (BENTYL) 20 MG tablet Take 1 tablet (20 mg total) by mouth 2 (two) times daily. 03/15/18   Garlon Hatchet, PA-C  ferrous sulfate 325 (65 FE) MG tablet Take 1 tablet (325 mg total) by mouth 2 (two) times daily with a meal. Patient not taking: Reported on 09/11/2016 01/26/16   [name], NP  indomethacin (INDOCIN) 50 MG capsule Take 1 capsule (50 mg  total) by mouth 2 (two) times daily with a meal. 04/16/17   [name], DO  loperamide (IMODIUM) 2 MG capsule Take 1 capsule (2 mg total) by mouth 4 (four) times daily as needed for diarrhea or loose stools. 03/15/18   Garlon Hatchet, PA-C  Menthol, Topical Analgesic, (BIOFREEZE) 4 % GEL Apply 1 application topically 2 (two) times daily. AND twice daily as needed Patient taking differently: Apply 1 application topically 4 (four) times daily as needed. For pain. 02/17/16   [name], NP  naproxen (NAPROSYN) 375 MG tablet Take 1 tablet (375 mg total) by mouth 2  (two) times daily with a meal. Patient taking differently: Take 375 mg by mouth 2 (two) times daily as needed. For pain. 04/16/16   [name], PA-C  ondansetron (ZOFRAN ODT) 4 MG disintegrating tablet Take 1 tablet (4 mg total) by mouth every 8 (eight) hours as needed for nausea. 03/15/18   Garlon Hatchet, PA-C  polyethylene glycol [facility] / Ethelene Hal) packet Take 17 g by mouth daily as needed (for constipation.).     [provider]  sulfacetamide (BLEPH-10) 10 % ophthalmic solution Place 1-2 drops into the right eye every 2 (two) hours while awake. 10/06/16   Teressa Lower, NP    Family History Family History  Problem Relation Age of Onset  . Diabetes Mother   . Hypertension Mother   . Diabetes Father   . Hypertension Father     Social History Social History   Tobacco Use  . Smoking status: Light Tobacco Smoker  . Smokeless tobacco: Never Used  Substance Use Topics  . Alcohol use: Yes    Alcohol/week: 1.8 oz    Types: 3 Cans of beer per week    Comment: daily   . Drug use: No     Allergies   Patient has no known allergies.   Review of Systems Review of Systems  Constitutional: Positive for fatigue. Negative for chills, diaphoresis and fever.  HENT: Negative for congestion.   Eyes: Negative for visual disturbance.  Respiratory: Positive for cough. Negative for chest tightness, shortness of breath, wheezing and stridor.   Cardiovascular: Positive for near-syncope. Negative for chest pain, palpitations and leg swelling.  Gastrointestinal: Positive for diarrhea. Negative for abdominal pain, constipation, nausea and vomiting.  Genitourinary: Negative for dysuria, flank pain and frequency.  Musculoskeletal: Negative for back pain, neck pain and neck stiffness.  Skin: Negative for rash and wound.  Neurological: Positive for light-headedness. Negative for dizziness, syncope, facial asymmetry, weakness, numbness and headaches.  Psychiatric/Behavioral: Negative  for agitation and confusion.  All other systems reviewed and are negative.    Physical Exam Updated Vital Signs BP (!) 159/95 (BP Location: Left Arm)   Pulse (!) 108   Temp 97.8 F (36.6 C) (Oral)   Resp 14   SpO2 95%   Physical Exam  Constitutional: He is oriented to person, place, and time. He appears well-developed and well-nourished. No distress.  HENT:  Head: Normocephalic and atraumatic.  Nose: Nose normal.  Mouth/Throat: Oropharynx is clear and moist. No oropharyngeal exudate.  Eyes: Pupils are equal, round, and reactive to light. Conjunctivae and EOM are normal.  Neck: Normal range of motion. Neck supple.  Cardiovascular: Regular rhythm and intact distal pulses. Tachycardia present.  No murmur heard. Pulmonary/Chest: Effort normal and breath sounds normal. No respiratory distress. He has no wheezes. He has no rales. He exhibits no tenderness.  Abdominal: He exhibits no distension. There is no tenderness.  Musculoskeletal: He exhibits no edema or tenderness.  Lymphadenopathy:    He has no cervical adenopathy.  Neurological: He is alert and oriented to person, place, and time. He is not disoriented. He displays no tremor. No sensory deficit. He exhibits normal muscle tone. GCS eye subscore is 4. GCS verbal subscore is 5. GCS motor subscore is 6.  Skin: Skin is warm. Capillary refill takes less than 2 seconds. He is not diaphoretic. No erythema. No pallor.  Psychiatric: His speech is not rapid and/or pressured. He is not agitated, not aggressive, not hyperactive and not actively hallucinating. Thought content is not delusional. He exhibits a depressed mood. He expresses no homicidal and no suicidal ideation. He expresses no suicidal plans and no homicidal plans. He is communicative.  Tearful and depressed   Nursing note and vitals reviewed.    ED Treatments / Results  Labs (all labs ordered are listed, but only abnormal results are displayed) Labs Reviewed  CBC WITH  DIFFERENTIAL/PLATELET - Abnormal; Notable for the following components:      Result Value   WBC 3.4 (*)    RBC 3.35 (*)    Hemoglobin 10.9 (*)    HCT 32.0 (*)    Neutro Abs 0.9 (*)    All other components within normal limits  COMPREHENSIVE METABOLIC PANEL - Abnormal; Notable for the following components:   AST 116 (*)    All other components within normal limits  URINALYSIS, ROUTINE W REFLEX MICROSCOPIC - Abnormal; Notable for the following components:   Color, Urine STRAW (*)    Hgb urine dipstick SMALL (*)    Bacteria, UA RARE (*)    All other components within normal limits  ETHANOL - Abnormal; Notable for the following components:   Alcohol, Ethyl (B) 349 (*)    All other components within normal limits  URINE CULTURE  CK  MAGNESIUM  RAPID URINE DRUG SCREEN, HOSP PERFORMED  TSH    EKG EKG Interpretation  Date/Time:  Friday May 19 2018 16:12:21 EDT Ventricular Rate:  103 PR Interval:    QRS Duration: 91 QT Interval:  352 QTC Calculation: 461 R Axis:   51 Text Interpretation:  Age not entered, assumed to be  57 years old for purpose of ECG interpretation Sinus tachycardia Consider left ventricular hypertrophy When compared to prior, t wave inverted in lead 3 compared to prior.  No STEMI Confirmed by Theda Belfast ([id number]) on 05/19/2018 5:38:09 PM   Radiology Dg Chest 2 View  Result Date: 05/19/2018 CLINICAL DATA:  Shortness of breath. EXAM: CHEST - 2 VIEW COMPARISON:  Radiographs of May 08, 2016. FINDINGS: The heart size and mediastinal contours are within normal limits. Both lungs are clear. No pneumothorax or pleural effusion is noted. The visualized skeletal structures are unremarkable. IMPRESSION: No active cardiopulmonary disease. Electronically Signed   By: Lupita Raider, M.D.   On: 05/19/2018 16:10    Procedures Procedures (including critical care time)  Medications Ordered in ED Medications  sodium chloride 0.9 % bolus 1,000 mL (0 mLs Intravenous Stopped  05/19/18 1821)  sodium chloride 0.9 % bolus 1,000 mL (0 mLs Intravenous Stopped 05/19/18 1821)     Initial Impression / Assessment and Plan / ED Course  I have reviewed the triage vital signs and the nursing notes.  Pertinent labs & imaging results that were available during my care of the patient were reviewed by me and considered in my medical decision making (see chart for details).     Sean Mccoy is a 57 y.o. male with a past medical history significant for schizophrenia, bipolar disorder, polysubstance abuse including alcohol, depression, anxiety, and is on disability for right hip pain and injury who presents with near syncopal episode and diarrhea.  Patient says he has not been eating or drinking well over the last few days and has been feeling very fatigued.  He says that he had diarrhea for the last few days and is feeling dehydrated.  He said that today he just got so tired he laid down to take a nap outside.  He says that bystanders saw him laying in bushes and the son and called EMS.  Patient says he thinks he was down for several hours but did not pass out he reports that he just went to sleep.  He denied any syncopal episodes, chest pain, palpitations or shortness of breath.  He denies any nausea vomiting, constipation or urinary symptoms.  He does report diarrhea.  He denies any SI or HI.  He denies taking any medicine for his depression which he reports is worsening.  He says that he is going to see a psychiatrist next week for further management and is still trying to see a primary care physician for further management of his chronic right hip pain after surgery 1 year ago.  He reports his previous PCP Dr. Ronne Binning lost his prescribing privileges and he has not been able to have his pain control since.  On exam, patient was tachycardic.  Patient was warm to the touch.  Patient was not tachypneic and was not hypotensive.  Patient was alert and oriented but had some mild sunburn on his  face and arms.  Patient's lungs were clear and chest was nontender.  Abdomen was nontender.  No new tenderness in the legs and patient normal strength in extremities.  Based on his symptoms I am concerned about dehydration from diarrhea and decreased oral intake in the setting of depression.  I am also concerned for rhabdo as patient was down for several hours in the sun including dehydration and overheating.  Patient will be given fluids and have laboratory testing to evaluate.  Patient does report he had a recent cough, also had work-up for occult infection including urine and chest x-ray.  Anticipate reassessment after work-up.  Diagnostic work-up was overall reassuring.  Mild anemia was slightly worse than last time but was similar to prior.  Urinalysis did not show infection however patient was intoxicated with an alcohol of 349.  Other work-up did not show significant ab normalities.  No evidence of rhabdo.  Chest x-ray shows no pneumonia no evidence of UTI.  Patient will much better after fluids and rest.  Suspect his lightheadedness was due to intoxication and dehydration.  As patient is metabolized I do not have any other concerns that he needs admission at this time.  Patient will follow-up with PCP as well as stay hydrated.  Patient understood return precautions and follow-up instructions.  Patient no other questions or concerns and was discharged in good condition.   Final Clinical Impressions(s) / ED Diagnoses   Final diagnoses:  Near syncope  Lightheadedness  Dehydration  Alcoholic intoxication without complication [facility])    ED Discharge Orders    None      Clinical Impression: 1. Near syncope   2. Lightheadedness   3. Dehydration   4. Alcoholic intoxication without complication (HCC)     Disposition: Discharge  Condition: Good  I have discussed the results, Dx and  Tx plan with the pt(& family if present). He/she/they expressed understanding and agree(s) with the  plan. Discharge instructions discussed at great length. Strict return precautions discussed and pt &/or family have verbalized understanding of the instructions. No further questions at time of discharge.    New Prescriptions   No medications on file    Follow Up: [facility] [address] [place] [address] [phone]     [facility] [address] [id number] mc [place] [address] [phone]       Tegeler, Canary Brim, MD 05/19/18 [phone]

## 2018-05-19 NOTE — ED Notes (Signed)
Patient resting comfortably

## 2018-05-19 NOTE — ED Notes (Signed)
Went to pt room to obtain VS and pt wasn't in room

## 2018-05-20 LAB — URINE CULTURE: Culture: NO GROWTH

## 2018-07-26 ENCOUNTER — Ambulatory Visit (INDEPENDENT_AMBULATORY_CARE_PROVIDER_SITE_OTHER): Payer: Medicaid Other | Admitting: Physician Assistant

## 2018-09-05 ENCOUNTER — Encounter: Payer: Self-pay | Admitting: Specialist

## 2018-11-06 ENCOUNTER — Emergency Department (HOSPITAL_COMMUNITY): Payer: Medicaid Other

## 2018-11-06 ENCOUNTER — Inpatient Hospital Stay (HOSPITAL_COMMUNITY)
Admission: EM | Admit: 2018-11-06 | Discharge: 2018-11-09 | DRG: 086 | Disposition: A | Discharge status: Home Health | Payer: Medicaid Other | Attending: General Surgery | Admitting: General Surgery

## 2018-11-06 ENCOUNTER — Encounter (HOSPITAL_COMMUNITY): Payer: Self-pay | Admitting: Emergency Medicine

## 2018-11-06 DIAGNOSIS — I10 Essential (primary) hypertension: Secondary | ICD-10-CM | POA: Diagnosis present

## 2018-11-06 DIAGNOSIS — S065X9A Traumatic subdural hemorrhage with loss of consciousness of unspecified duration, initial encounter: Secondary | ICD-10-CM

## 2018-11-06 DIAGNOSIS — M1990 Unspecified osteoarthritis, unspecified site: Secondary | ICD-10-CM | POA: Diagnosis present

## 2018-11-06 DIAGNOSIS — S065XAA Traumatic subdural hemorrhage with loss of consciousness status unknown, initial encounter: Secondary | ICD-10-CM | POA: Diagnosis present

## 2018-11-06 DIAGNOSIS — S0292XA Unspecified fracture of facial bones, initial encounter for closed fracture: Secondary | ICD-10-CM | POA: Diagnosis present

## 2018-11-06 DIAGNOSIS — S0230XA Fracture of orbital floor, unspecified side, initial encounter for closed fracture: Secondary | ICD-10-CM

## 2018-11-06 DIAGNOSIS — S0231XA Fracture of orbital floor, right side, initial encounter for closed fracture: Secondary | ICD-10-CM

## 2018-11-06 DIAGNOSIS — R402252 Coma scale, best verbal response, oriented, at arrival to emergency department: Secondary | ICD-10-CM | POA: Diagnosis present

## 2018-11-06 DIAGNOSIS — F209 Schizophrenia, unspecified: Secondary | ICD-10-CM | POA: Diagnosis present

## 2018-11-06 DIAGNOSIS — F4310 Post-traumatic stress disorder, unspecified: Secondary | ICD-10-CM | POA: Diagnosis present

## 2018-11-06 DIAGNOSIS — R402362 Coma scale, best motor response, obeys commands, at arrival to emergency department: Secondary | ICD-10-CM | POA: Diagnosis present

## 2018-11-06 DIAGNOSIS — D649 Anemia, unspecified: Secondary | ICD-10-CM | POA: Diagnosis present

## 2018-11-06 DIAGNOSIS — Z8249 Family history of ischemic heart disease and other diseases of the circulatory system: Secondary | ICD-10-CM

## 2018-11-06 DIAGNOSIS — F10120 Alcohol abuse with intoxication, uncomplicated: Secondary | ICD-10-CM | POA: Diagnosis present

## 2018-11-06 DIAGNOSIS — S066X0A Traumatic subarachnoid hemorrhage without loss of consciousness, initial encounter: Secondary | ICD-10-CM | POA: Diagnosis present

## 2018-11-06 DIAGNOSIS — M264 Malocclusion, unspecified: Secondary | ICD-10-CM | POA: Diagnosis present

## 2018-11-06 DIAGNOSIS — F319 Bipolar disorder, unspecified: Secondary | ICD-10-CM | POA: Diagnosis present

## 2018-11-06 DIAGNOSIS — R402132 Coma scale, eyes open, to sound, at arrival to emergency department: Secondary | ICD-10-CM | POA: Diagnosis present

## 2018-11-06 DIAGNOSIS — Y92480 Sidewalk as the place of occurrence of the external cause: Secondary | ICD-10-CM

## 2018-11-06 DIAGNOSIS — F17200 Nicotine dependence, unspecified, uncomplicated: Secondary | ICD-10-CM | POA: Diagnosis present

## 2018-11-06 DIAGNOSIS — S066X9A Traumatic subarachnoid hemorrhage with loss of consciousness of unspecified duration, initial encounter: Secondary | ICD-10-CM

## 2018-11-06 DIAGNOSIS — W1839XA Other fall on same level, initial encounter: Secondary | ICD-10-CM | POA: Diagnosis present

## 2018-11-06 DIAGNOSIS — M879 Osteonecrosis, unspecified: Secondary | ICD-10-CM | POA: Diagnosis present

## 2018-11-06 DIAGNOSIS — R740 Nonspecific elevation of levels of transaminase and lactic acid dehydrogenase [LDH]: Secondary | ICD-10-CM | POA: Diagnosis present

## 2018-11-06 DIAGNOSIS — S065X0A Traumatic subdural hemorrhage without loss of consciousness, initial encounter: Principal | ICD-10-CM | POA: Diagnosis present

## 2018-11-06 DIAGNOSIS — Z96641 Presence of right artificial hip joint: Secondary | ICD-10-CM | POA: Diagnosis present

## 2018-11-06 DIAGNOSIS — S0081XA Abrasion of other part of head, initial encounter: Secondary | ICD-10-CM

## 2018-11-06 DIAGNOSIS — F10920 Alcohol use, unspecified with intoxication, uncomplicated: Secondary | ICD-10-CM

## 2018-11-06 HISTORY — DX: Fracture of orbital floor, unspecified side, initial encounter for closed fracture: S02.30XA

## 2018-11-06 HISTORY — DX: Abrasion of other part of head, initial encounter: S00.81XA

## 2018-11-06 LAB — I-STAT CHEM 8, ED
BUN: 11 mg/dL (ref 6–20)
CALCIUM ION: 1.1 mmol/L — AB (ref 1.15–1.40)
Chloride: 102 mmol/L (ref 98–111)
Creatinine, Ser: 1.1 mg/dL (ref 0.61–1.24)
GLUCOSE: 84 mg/dL (ref 70–99)
HCT: 39 % (ref 39.0–52.0)
Hemoglobin: 13.3 g/dL (ref 13.0–17.0)
Potassium: 4.3 mmol/L (ref 3.5–5.1)
Sodium: 137 mmol/L (ref 135–145)
TCO2: 25 mmol/L (ref 22–32)

## 2018-11-06 LAB — CBC
HCT: 35.6 % — ABNORMAL LOW (ref 39.0–52.0)
Hemoglobin: 10.9 g/dL — ABNORMAL LOW (ref 13.0–17.0)
MCH: 30.9 pg (ref 26.0–34.0)
MCHC: 30.6 g/dL (ref 30.0–36.0)
MCV: 100.8 fL — ABNORMAL HIGH (ref 80.0–100.0)
Platelets: 189 10*3/uL (ref 150–400)
RBC: 3.53 MIL/uL — ABNORMAL LOW (ref 4.22–5.81)
RDW: 13.9 % (ref 11.5–15.5)
WBC: 5.5 10*3/uL (ref 4.0–10.5)
nRBC: 0 % (ref 0.0–0.2)

## 2018-11-06 LAB — PROTIME-INR
INR: 0.92
PROTHROMBIN TIME: 12.3 s (ref 11.4–15.2)

## 2018-11-06 MED ORDER — TETANUS-DIPHTH-ACELL PERTUSSIS 5-2.5-18.5 LF-MCG/0.5 IM SUSP
INTRAMUSCULAR | Status: AC
  Filled 2018-11-06: qty 0.5

## 2018-11-06 MED ORDER — TETANUS-DIPHTH-ACELL PERTUSSIS 5-2.5-18.5 LF-MCG/0.5 IM SUSP
0.5000 mL | Freq: Once | INTRAMUSCULAR | Status: AC
  Administered 2018-11-06: 0.5 mL via INTRAMUSCULAR

## 2018-11-06 NOTE — ED Provider Notes (Signed)
[facility] EMERGENCY DEPARTMENT Provider Note   CSN: [id number] Arrival date & time: 11/06/18  2320     History   Chief Complaint Chief Complaint  Patient presents with  . Altered Mental Status  . Fall  . Head Injury    HPI Sean Mccoy is a 57 y.o. male.  HPI  This is a 57 year old male who presents by EMS after a fall.  Per EMS, he was witnessed by bystander to fall face first from standing onto concrete walkway.  No known loss of consciousness.  EMS noted that he was very sleepy with pinpoint pupils and respiratory rate of 700.  He was given 0.4 mg of Narcan with interval improvement.  Patient is able to tell me his name and follow commands but does not contribute to history taking.  He has obvious injury of the right side of his face.  He denies alcohol or drug use but was found to have alcohol on his person.  Level 5 caveat for visit: Altered mental status Past Medical History:  Diagnosis Date  . Ankle injury   . Anxiety   . Arthritis   . Avascular necrosis (HCC)   . Bipolar 1 disorder (HCC)   . Depression   . Groin injury   . Hypertension   . Knee fracture   . PTSD (post-traumatic stress disorder)   . Schizophrenia [facility])     Patient Active Problem List   Diagnosis Date Noted  . 'light-for-dates' infant with signs of fetal malnutrition 03/20/2018  . Polysubstance (including opioids) dependence w/o physiol dependence (HCC) 09/11/2016  . Constipation due to pain medication therapy 02/17/2016  . Postoperative anemia due to acute blood loss 02/17/2016  . Osteoporosis 02/17/2016  . Right knee pain 02/17/2016  . Avascular necrosis of bone of right hip (HCC) 01/20/2016  . 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 Depression 11/29/2012  . Anxiety 11/29/2012  . Post traumatic stress disorder 11/29/2012  . Substance induced mood disorder (HCC) 11/29/2012  . Alcohol abuse 11/29/2012    Past Surgical History:  Procedure Laterality  Date  . KNEE SURGERY    . TOTAL HIP ARTHROPLASTY Right 01/20/2016   Procedure: RIGHT TOTAL HIP ARTHROPLASTY ANTERIOR APPROACH;  Surgeon: Kathryne Hitch, MD;  Location: [facility] OR;  Service: Orthopedics;  Laterality: Right;        Home Medications    Prior to Admission medications   Medication Sig Start Date End Date Taking? Authorizing Provider  acetaminophen (TYLENOL) 500 MG tablet Take 500-1,000 mg by mouth daily as needed for moderate pain.    [provider]  alendronate (FOSAMAX) 70 MG tablet Take 1 tablet (70 mg total) by mouth every 7 (seven) days. Take with a full glass of water on an empty stomach. Patient not taking: Reported on 05/19/2018 02/17/16   [name], NP  aspirin EC 325 MG EC tablet Take 1 tablet (325 mg total) by mouth 2 (two) times daily after a meal. Patient not taking: Reported on 05/19/2018 01/23/16   Kathryne Hitch, MD  calcium carbonate (TUMS) 500 MG chewable tablet Chew 1 tablet (200 mg of elemental calcium total) by mouth 3 (three) times daily with meals. Patient not taking: Reported on 05/19/2018 02/17/16   [name], NP  Cholecalciferol (VITAMIN D) 2000 units CAPS Take 1 capsule (2,000 Units total) by mouth daily. Patient not taking: Reported on 05/19/2018 02/17/16   [name], NP  dicyclomine (BENTYL) 20 MG tablet  Take 1 tablet (20 mg total) by mouth 2 (two) times daily. Patient not taking: Reported on 05/19/2018 03/15/18   Garlon Hatchet, PA-C  ferrous sulfate 325 (65 FE) MG tablet Take 1 tablet (325 mg total) by mouth 2 (two) times daily with a meal. Patient not taking: Reported on 05/19/2018 01/26/16   [name], NP  indomethacin (INDOCIN) 50 MG capsule Take 1 capsule (50 mg total) by mouth 2 (two) times daily with a meal. Patient not taking: Reported on 05/19/2018 04/16/17   [name], DO  loperamide (IMODIUM) 2 MG capsule Take 1 capsule (2 mg total) by mouth 4 (four) times daily as needed for diarrhea or loose  stools. Patient not taking: Reported on 05/19/2018 03/15/18   Garlon Hatchet, PA-C  Menthol, Topical Analgesic, (BIOFREEZE) 4 % GEL Apply 1 application topically 2 (two) times daily. AND twice daily as needed Patient not taking: Reported on 05/19/2018 02/17/16   [name], NP  naproxen (NAPROSYN) 375 MG tablet Take 1 tablet (375 mg total) by mouth 2 (two) times daily with a meal. Patient not taking: Reported on 05/19/2018 04/16/16   [name], PA-C  ondansetron (ZOFRAN ODT) 4 MG disintegrating tablet Take 1 tablet (4 mg total) by mouth every 8 (eight) hours as needed for nausea. Patient not taking: Reported on 05/19/2018 03/15/18   Garlon Hatchet, PA-C  sulfacetamide (BLEPH-10) 10 % ophthalmic solution Place 1-2 drops into the right eye every 2 (two) hours while awake. Patient not taking: Reported on 05/19/2018 10/06/16   Teressa Lower, NP    Family History Family History  Problem Relation Age of Onset  . Diabetes Mother   . Hypertension Mother   . Diabetes Father   . Hypertension Father     Social History Social History   Tobacco Use  . Smoking status: Light Tobacco Smoker  . Smokeless tobacco: Never Used  Substance Use Topics  . Alcohol use: Yes    Alcohol/week: 3.0 standard drinks    Types: 3 Cans of beer per week    Comment: daily   . Drug use: No     Allergies   Patient has no known allergies.   Review of Systems Review of Systems  Unable to perform ROS: Mental status change     Physical Exam Updated Vital Signs BP 139/73   Pulse 96   Temp (!) 96.9 F (36.1 C) (Temporal)   Resp 15   SpO2 96%   Physical Exam  Constitutional: He is oriented to person, place, and time.  ABCs intact, obvious facial trauma  HENT:  Head: Normocephalic and atraumatic.  Abrasion/laceration over the right temporal region, blood noted over the naris, no oral pharyngeal blood, midface is stable, slight swelling of the right eye  Eyes: Pupils are equal, round, and reactive  to light.  Right pupil 4 mm reactive, left pupil 3 mm and reactive  Neck:  C-collar in place  Cardiovascular: Normal rate, regular rhythm and normal heart sounds.  No murmur heard. Pulmonary/Chest: Effort normal and breath sounds normal. No respiratory distress. He has no wheezes.  Overall diminished breath sounds  Abdominal: Soft. Bowel sounds are normal. There is no tenderness. There is no rebound.  Musculoskeletal: He exhibits no edema or deformity.  Neurological: He is alert and oriented to person, place, and time. GCS eye subscore is 3. GCS verbal subscore is 4. GCS motor subscore is 6.  GCS 13  Skin: Skin is warm and dry.  See HEENT  above  Psychiatric: He has a normal mood and affect.  Nursing note and vitals reviewed.    ED Treatments / Results  Labs (all labs ordered are listed, but only abnormal results are displayed) Labs Reviewed  COMPREHENSIVE METABOLIC PANEL - Abnormal; Notable for the following components:      Result Value   AST 117 (*)    ALT 48 (*)    All other components within normal limits  CBC - Abnormal; Notable for the following components:   RBC 3.53 (*)    Hemoglobin 10.9 (*)    HCT 35.6 (*)    MCV 100.8 (*)    All other components within normal limits  ETHANOL - Abnormal; Notable for the following components:   Alcohol, Ethyl (B) 313 (*)    All other components within normal limits  URINALYSIS, ROUTINE W REFLEX MICROSCOPIC - Abnormal; Notable for the following components:   Color, Urine COLORLESS (*)    Specific Gravity, Urine 1.004 (*)    All other components within normal limits  I-STAT CHEM 8, ED - Abnormal; Notable for the following components:   Calcium, Ion 1.10 (*)    All other components within normal limits  PROTIME-INR  RAPID URINE DRUG SCREEN, HOSP PERFORMED  SAMPLE TO BLOOD BANK    EKG EKG Interpretation  Date/Time:  Monday November 06 2018 23:26:06 EST Ventricular Rate:  123 PR Interval:    QRS Duration: 90 QT  Interval:  324 QTC Calculation: 464 R Axis:   52 Text Interpretation:  Sinus tachycardia Consider left ventricular hypertrophy Confirmed by Ross Marcus ([id number]) on 11/07/2018 12:55:26 AM   Radiology Ct Head Wo Contrast  Result Date: 11/07/2018 CLINICAL DATA:  Pain after fall. EXAM: CT HEAD WITHOUT CONTRAST CT MAXILLOFACIAL WITHOUT CONTRAST CT CERVICAL SPINE WITHOUT CONTRAST TECHNIQUE: Multidetector CT imaging of the head, cervical spine, and maxillofacial structures were performed using the standard protocol without intravenous contrast. Multiplanar CT image reconstructions of the cervical spine and maxillofacial structures were also generated. COMPARISON:  August 31, 2015 FINDINGS: CT HEAD FINDINGS Brain: There is blood along the left side of the falx consistent with subdural blood. No other subdural or epidural blood identified. There is a small amount of subarachnoid hemorrhage posteriorly on the left on axial image 17. I suspect a small underlying hemorrhagic contusion. No other extra-axial hemorrhage noted. The ventricles are unremarkable. No blood in the ventricles identified. The cerebellum and brainstem are unremarkable. Basal cisterns are patent. No midline shift. No acute cortical ischemia or infarct. Vascular: Calcified atherosclerosis in the intracranial carotids. Skull: Fractures in the right maxillary sinus will be described below. Other: There is soft tissue swelling over the right scalp and periorbital region. The underlying globe is intact. There is air in the soft tissue surrounding the right maxillary sinus consistent with fracture. Extracranial soft tissues otherwise normal. CT MAXILLOFACIAL FINDINGS Osseous: There is a nondisplaced fracture through the anterior aspect of the right zygomatic arch as it joins the right maxillary sinus, as seen on axial image 48. There is also a nondisplaced fracture through the posterior aspect of the right zygomatic arch with fluid in a few  adjacent anterior mastoid air cells. The mandible and left zygomatic arches are intact. Probable fracture at the base of the right nasal bone on axial image 47. There is a fracture through the body of the right pterygoid. This is best seen on sagittal image 34. Orbits: There is a fracture through the floor of the right orbit. There is also  a fracture through the medial wall as seen on coronal image 28. There is a fracture through the posterolateral wall of the orbit as well as seen on coronal image 35. There is herniation of a small amount of fat through the right orbital floor fracture. The rectus muscle however is not herniated. The orbital floor fracture extends through the inferior orbital nerve foramen. The left orbit is intact. Sinuses: Multiple fractures are identified in the right maxillary sinus. There are fractures through the right orbital floor/right maxillary roof. This fracture is displaced. There is some herniated fat from the orbit but the inferior rectus muscle does not appear to be entrapped. There are fractures through the right maxillary inferior, anterior, posterior, and lateral walls as well. There is opacification of the right maxillary sinus. No other fractures associated with the sinuses. Soft tissues: There is soft tissue swelling over the right side of the mandible and right side of the maxilla. The soft tissue swelling extends superiorly over the cheek and into the periorbital region and forehead. There is air within the swollen soft tissues, likely from the right maxillary sinus fractures which will be described above. Globes intact. CT CERVICAL SPINE FINDINGS Alignment: Normal. Skull base and vertebrae: No acute fracture. No primary bone lesion or focal pathologic process. Soft tissues and spinal canal: No prevertebral fluid or swelling. No visible canal hematoma. Disc levels:  Multilevel degenerative changes. Upper chest: Negative. Other: No other abnormalities. IMPRESSION: 1. There is  a small amount of subdural blood along the left side of the falx. 2. There is a small amount of subarachnoid hemorrhage over the left posterior parietooccipital region. There may be a small underlying hemorrhagic contusion. 3. There is a tripod fracture involving the floor of the right orbit, multiple walls of the right maxillary sinus, and these anterior and posterior aspects of the right zygomatic arch. 4. There is a fracture through the inferior wall of the right orbit with a herniation of a small amount of fat. The rectus muscles not herniated. 5. Fractures through the right maxillary sinus involve the posterior, anterior, superior, and lateral walls. 6. There is a fracture through the right pterygoid which is nondisplaced. 7. There is a fracture through the base the right nasal bones. 8. No fracture or traumatic malalignment in the cervical spine. Findings called to Dr. Wilkie Aye. Electronically Signed   By: Gerome Sam III M.D   On: 11/07/2018 01:27   Ct Cervical Spine Wo Contrast  Result Date: 11/07/2018 CLINICAL DATA:  Pain after fall. EXAM: CT HEAD WITHOUT CONTRAST CT MAXILLOFACIAL WITHOUT CONTRAST CT CERVICAL SPINE WITHOUT CONTRAST TECHNIQUE: Multidetector CT imaging of the head, cervical spine, and maxillofacial structures were performed using the standard protocol without intravenous contrast. Multiplanar CT image reconstructions of the cervical spine and maxillofacial structures were also generated. COMPARISON:  August 31, 2015 FINDINGS: CT HEAD FINDINGS Brain: There is blood along the left side of the falx consistent with subdural blood. No other subdural or epidural blood identified. There is a small amount of subarachnoid hemorrhage posteriorly on the left on axial image 17. I suspect a small underlying hemorrhagic contusion. No other extra-axial hemorrhage noted. The ventricles are unremarkable. No blood in the ventricles identified. The cerebellum and brainstem are unremarkable. Basal  cisterns are patent. No midline shift. No acute cortical ischemia or infarct. Vascular: Calcified atherosclerosis in the intracranial carotids. Skull: Fractures in the right maxillary sinus will be described below. Other: There is soft tissue swelling over the right scalp and  periorbital region. The underlying globe is intact. There is air in the soft tissue surrounding the right maxillary sinus consistent with fracture. Extracranial soft tissues otherwise normal. CT MAXILLOFACIAL FINDINGS Osseous: There is a nondisplaced fracture through the anterior aspect of the right zygomatic arch as it joins the right maxillary sinus, as seen on axial image 48. There is also a nondisplaced fracture through the posterior aspect of the right zygomatic arch with fluid in a few adjacent anterior mastoid air cells. The mandible and left zygomatic arches are intact. Probable fracture at the base of the right nasal bone on axial image 47. There is a fracture through the body of the right pterygoid. This is best seen on sagittal image 34. Orbits: There is a fracture through the floor of the right orbit. There is also a fracture through the medial wall as seen on coronal image 28. There is a fracture through the posterolateral wall of the orbit as well as seen on coronal image 35. There is herniation of a small amount of fat through the right orbital floor fracture. The rectus muscle however is not herniated. The orbital floor fracture extends through the inferior orbital nerve foramen. The left orbit is intact. Sinuses: Multiple fractures are identified in the right maxillary sinus. There are fractures through the right orbital floor/right maxillary roof. This fracture is displaced. There is some herniated fat from the orbit but the inferior rectus muscle does not appear to be entrapped. There are fractures through the right maxillary inferior, anterior, posterior, and lateral walls as well. There is opacification of the right  maxillary sinus. No other fractures associated with the sinuses. Soft tissues: There is soft tissue swelling over the right side of the mandible and right side of the maxilla. The soft tissue swelling extends superiorly over the cheek and into the periorbital region and forehead. There is air within the swollen soft tissues, likely from the right maxillary sinus fractures which will be described above. Globes intact. CT CERVICAL SPINE FINDINGS Alignment: Normal. Skull base and vertebrae: No acute fracture. No primary bone lesion or focal pathologic process. Soft tissues and spinal canal: No prevertebral fluid or swelling. No visible canal hematoma. Disc levels:  Multilevel degenerative changes. Upper chest: Negative. Other: No other abnormalities. IMPRESSION: 1. There is a small amount of subdural blood along the left side of the falx. 2. There is a small amount of subarachnoid hemorrhage over the left posterior parietooccipital region. There may be a small underlying hemorrhagic contusion. 3. There is a tripod fracture involving the floor of the right orbit, multiple walls of the right maxillary sinus, and these anterior and posterior aspects of the right zygomatic arch. 4. There is a fracture through the inferior wall of the right orbit with a herniation of a small amount of fat. The rectus muscles not herniated. 5. Fractures through the right maxillary sinus involve the posterior, anterior, superior, and lateral walls. 6. There is a fracture through the right pterygoid which is nondisplaced. 7. There is a fracture through the base the right nasal bones. 8. No fracture or traumatic malalignment in the cervical spine. Findings called to Dr. Wilkie Aye. Electronically Signed   By: Gerome Sam III M.D   On: 11/07/2018 01:27   Ct Maxillofacial Wo Contrast  Result Date: 11/07/2018 CLINICAL DATA:  Pain after fall. EXAM: CT HEAD WITHOUT CONTRAST CT MAXILLOFACIAL WITHOUT CONTRAST CT CERVICAL SPINE WITHOUT CONTRAST  TECHNIQUE: Multidetector CT imaging of the head, cervical spine, and maxillofacial structures were performed  using the standard protocol without intravenous contrast. Multiplanar CT image reconstructions of the cervical spine and maxillofacial structures were also generated. COMPARISON:  August 31, 2015 FINDINGS: CT HEAD FINDINGS Brain: There is blood along the left side of the falx consistent with subdural blood. No other subdural or epidural blood identified. There is a small amount of subarachnoid hemorrhage posteriorly on the left on axial image 17. I suspect a small underlying hemorrhagic contusion. No other extra-axial hemorrhage noted. The ventricles are unremarkable. No blood in the ventricles identified. The cerebellum and brainstem are unremarkable. Basal cisterns are patent. No midline shift. No acute cortical ischemia or infarct. Vascular: Calcified atherosclerosis in the intracranial carotids. Skull: Fractures in the right maxillary sinus will be described below. Other: There is soft tissue swelling over the right scalp and periorbital region. The underlying globe is intact. There is air in the soft tissue surrounding the right maxillary sinus consistent with fracture. Extracranial soft tissues otherwise normal. CT MAXILLOFACIAL FINDINGS Osseous: There is a nondisplaced fracture through the anterior aspect of the right zygomatic arch as it joins the right maxillary sinus, as seen on axial image 48. There is also a nondisplaced fracture through the posterior aspect of the right zygomatic arch with fluid in a few adjacent anterior mastoid air cells. The mandible and left zygomatic arches are intact. Probable fracture at the base of the right nasal bone on axial image 47. There is a fracture through the body of the right pterygoid. This is best seen on sagittal image 34. Orbits: There is a fracture through the floor of the right orbit. There is also a fracture through the medial wall as seen on coronal  image 28. There is a fracture through the posterolateral wall of the orbit as well as seen on coronal image 35. There is herniation of a small amount of fat through the right orbital floor fracture. The rectus muscle however is not herniated. The orbital floor fracture extends through the inferior orbital nerve foramen. The left orbit is intact. Sinuses: Multiple fractures are identified in the right maxillary sinus. There are fractures through the right orbital floor/right maxillary roof. This fracture is displaced. There is some herniated fat from the orbit but the inferior rectus muscle does not appear to be entrapped. There are fractures through the right maxillary inferior, anterior, posterior, and lateral walls as well. There is opacification of the right maxillary sinus. No other fractures associated with the sinuses. Soft tissues: There is soft tissue swelling over the right side of the mandible and right side of the maxilla. The soft tissue swelling extends superiorly over the cheek and into the periorbital region and forehead. There is air within the swollen soft tissues, likely from the right maxillary sinus fractures which will be described above. Globes intact. CT CERVICAL SPINE FINDINGS Alignment: Normal. Skull base and vertebrae: No acute fracture. No primary bone lesion or focal pathologic process. Soft tissues and spinal canal: No prevertebral fluid or swelling. No visible canal hematoma. Disc levels:  Multilevel degenerative changes. Upper chest: Negative. Other: No other abnormalities. IMPRESSION: 1. There is a small amount of subdural blood along the left side of the falx. 2. There is a small amount of subarachnoid hemorrhage over the left posterior parietooccipital region. There may be a small underlying hemorrhagic contusion. 3. There is a tripod fracture involving the floor of the right orbit, multiple walls of the right maxillary sinus, and these anterior and posterior aspects of the right  zygomatic arch. 4. There  is a fracture through the inferior wall of the right orbit with a herniation of a small amount of fat. The rectus muscles not herniated. 5. Fractures through the right maxillary sinus involve the posterior, anterior, superior, and lateral walls. 6. There is a fracture through the right pterygoid which is nondisplaced. 7. There is a fracture through the base the right nasal bones. 8. No fracture or traumatic malalignment in the cervical spine. Findings called to Dr. Wilkie Aye. Electronically Signed   By: Gerome Sam III M.D   On: 11/07/2018 01:27    Procedures Procedures (including critical care time)  CRITICAL CARE Performed by: Shon Baton   Total critical care time: 60 minutes  Critical care time was exclusive of separately billable procedures and treating other patients.  Critical care was necessary to treat or prevent imminent or life-threatening deterioration.  Critical care was time spent personally by me on the following activities: development of treatment plan with patient and/or surrogate as well as nursing, discussions with consultants, evaluation of patient's response to treatment, examination of patient, obtaining history from patient or surrogate, ordering and performing treatments and interventions, ordering and review of laboratory studies, ordering and review of radiographic studies, pulse oximetry and re-evaluation of patient's condition.  Medications Ordered in ED Medications  Tdap (BOOSTRIX) injection 0.5 mL (0.5 mLs Intramuscular Given 11/06/18 2346)  sodium chloride 0.9 % bolus 1,000 mL (1,000 mLs Intravenous New Bag/Given 11/07/18 0110)     Initial Impression / Assessment and Plan / ED Course  I have reviewed the triage vital signs and the nursing notes.  Pertinent labs & imaging results that were available during my care of the patient were reviewed by me and considered in my medical decision making (see chart for  details).  Clinical Course as of Nov 07 241  Tue Nov 07, 2018  0133 Seems to call from radiology.  Patient was both subdural and subarachnoid blood as well as multiple facial bone fractures.  On recheck, he remains somnolent but his airway is intact.  Consult trauma and neurosurgery.   [CH]  0231 Patient discussed with Dr. Andrey Campanile, trauma surgery.  He will evaluate patient.  Facial trauma MD consulted regarding multiple facial fractures.   [CH]  0241 Discussed with Dr. Ulice Bold who will evaluate the patient.   [CH]    Clinical Course User Index [CH] Maimouna Rondeau, Mayer Masker, MD    Patient presents by EMS with loss of consciousness and right-sided facial trauma.  GCS is between 12 and 13.  He does follow simple commands but does not provide much history.  Clinical history suggestive of possible overdose and/or intoxication.  ABCs are intact at this time.  Only notable trauma is to the face.  CT head, face, C-spine ordered.  Trauma lab work ordered.  EtOH is greater than 300.  See clinical course above.  Patient has multiple head bleeds as well as multiple facial fractures.  His GCS is stable and his airway remains intact.  Patient was discussed with trauma surgery, neurosurgery, and ENT.  See clinical course above.  Patient will be admitted to trauma surgery.  Final Clinical Impressions(s) / ED Diagnoses   Final diagnoses:  Traumatic subdural hematoma with loss of consciousness, initial encounter (HCC)  Subarachnoid hemorrhage following injury, with loss of consciousness, initial encounter (HCC)  Closed fracture of right orbital floor, initial encounter [facility])  Alcoholic intoxication without complication [facility])    ED Discharge Orders    None  Shon Baton, MD 11/07/18 [phone]

## 2018-11-06 NOTE — ED Triage Notes (Signed)
Pt presents to ER with [facility] for injuries from ground level fall, striking R face; pinpoint pupils, resp rate 7, 91% room air; 0.4mg  narcan given with some improvement; now answering some questions

## 2018-11-07 ENCOUNTER — Other Ambulatory Visit: Payer: Self-pay

## 2018-11-07 ENCOUNTER — Encounter (HOSPITAL_COMMUNITY): Payer: Self-pay

## 2018-11-07 DIAGNOSIS — R4182 Altered mental status, unspecified: Secondary | ICD-10-CM | POA: Diagnosis present

## 2018-11-07 DIAGNOSIS — M879 Osteonecrosis, unspecified: Secondary | ICD-10-CM | POA: Diagnosis present

## 2018-11-07 DIAGNOSIS — S022XXA Fracture of nasal bones, initial encounter for closed fracture: Secondary | ICD-10-CM

## 2018-11-07 DIAGNOSIS — S066X0A Traumatic subarachnoid hemorrhage without loss of consciousness, initial encounter: Secondary | ICD-10-CM | POA: Diagnosis present

## 2018-11-07 DIAGNOSIS — M264 Malocclusion, unspecified: Secondary | ICD-10-CM | POA: Diagnosis present

## 2018-11-07 DIAGNOSIS — I10 Essential (primary) hypertension: Secondary | ICD-10-CM | POA: Diagnosis present

## 2018-11-07 DIAGNOSIS — F10120 Alcohol abuse with intoxication, uncomplicated: Secondary | ICD-10-CM | POA: Diagnosis present

## 2018-11-07 DIAGNOSIS — S0292XA Unspecified fracture of facial bones, initial encounter for closed fracture: Secondary | ICD-10-CM | POA: Diagnosis present

## 2018-11-07 DIAGNOSIS — W19XXXA Unspecified fall, initial encounter: Secondary | ICD-10-CM

## 2018-11-07 DIAGNOSIS — Y92480 Sidewalk as the place of occurrence of the external cause: Secondary | ICD-10-CM | POA: Diagnosis not present

## 2018-11-07 DIAGNOSIS — F17200 Nicotine dependence, unspecified, uncomplicated: Secondary | ICD-10-CM | POA: Diagnosis present

## 2018-11-07 DIAGNOSIS — R740 Nonspecific elevation of levels of transaminase and lactic acid dehydrogenase [LDH]: Secondary | ICD-10-CM | POA: Diagnosis present

## 2018-11-07 DIAGNOSIS — W1839XA Other fall on same level, initial encounter: Secondary | ICD-10-CM | POA: Diagnosis present

## 2018-11-07 DIAGNOSIS — R402252 Coma scale, best verbal response, oriented, at arrival to emergency department: Secondary | ICD-10-CM | POA: Diagnosis present

## 2018-11-07 DIAGNOSIS — S065X0A Traumatic subdural hemorrhage without loss of consciousness, initial encounter: Secondary | ICD-10-CM | POA: Diagnosis present

## 2018-11-07 DIAGNOSIS — R402362 Coma scale, best motor response, obeys commands, at arrival to emergency department: Secondary | ICD-10-CM | POA: Diagnosis present

## 2018-11-07 DIAGNOSIS — S065X9A Traumatic subdural hemorrhage with loss of consciousness of unspecified duration, initial encounter: Secondary | ICD-10-CM | POA: Diagnosis present

## 2018-11-07 DIAGNOSIS — F4310 Post-traumatic stress disorder, unspecified: Secondary | ICD-10-CM | POA: Diagnosis present

## 2018-11-07 DIAGNOSIS — F209 Schizophrenia, unspecified: Secondary | ICD-10-CM | POA: Diagnosis present

## 2018-11-07 DIAGNOSIS — Z8249 Family history of ischemic heart disease and other diseases of the circulatory system: Secondary | ICD-10-CM | POA: Diagnosis not present

## 2018-11-07 DIAGNOSIS — M1990 Unspecified osteoarthritis, unspecified site: Secondary | ICD-10-CM | POA: Diagnosis present

## 2018-11-07 DIAGNOSIS — S0240EB Zygomatic fracture, right side, initial encounter for open fracture: Secondary | ICD-10-CM | POA: Diagnosis not present

## 2018-11-07 DIAGNOSIS — R402132 Coma scale, eyes open, to sound, at arrival to emergency department: Secondary | ICD-10-CM | POA: Diagnosis present

## 2018-11-07 DIAGNOSIS — D649 Anemia, unspecified: Secondary | ICD-10-CM | POA: Diagnosis present

## 2018-11-07 DIAGNOSIS — Z96641 Presence of right artificial hip joint: Secondary | ICD-10-CM | POA: Diagnosis present

## 2018-11-07 DIAGNOSIS — S065XAA Traumatic subdural hemorrhage with loss of consciousness status unknown, initial encounter: Secondary | ICD-10-CM | POA: Diagnosis present

## 2018-11-07 DIAGNOSIS — S0240CA Maxillary fracture, right side, initial encounter for closed fracture: Secondary | ICD-10-CM

## 2018-11-07 DIAGNOSIS — F319 Bipolar disorder, unspecified: Secondary | ICD-10-CM | POA: Diagnosis present

## 2018-11-07 DIAGNOSIS — S0231XA Fracture of orbital floor, right side, initial encounter for closed fracture: Secondary | ICD-10-CM | POA: Diagnosis not present

## 2018-11-07 LAB — URINALYSIS, ROUTINE W REFLEX MICROSCOPIC
Bilirubin Urine: NEGATIVE
GLUCOSE, UA: NEGATIVE mg/dL
Hgb urine dipstick: NEGATIVE
KETONES UR: NEGATIVE mg/dL
LEUKOCYTES UA: NEGATIVE
NITRITE: NEGATIVE
PROTEIN: NEGATIVE mg/dL
Specific Gravity, Urine: 1.004 — ABNORMAL LOW (ref 1.005–1.030)
pH: 5 (ref 5.0–8.0)

## 2018-11-07 LAB — SAMPLE TO BLOOD BANK

## 2018-11-07 LAB — RAPID URINE DRUG SCREEN, HOSP PERFORMED
Amphetamines: NOT DETECTED
Barbiturates: NOT DETECTED
Benzodiazepines: NOT DETECTED
Cocaine: NOT DETECTED
OPIATES: NOT DETECTED
TETRAHYDROCANNABINOL: NOT DETECTED

## 2018-11-07 LAB — COMPREHENSIVE METABOLIC PANEL
ALK PHOS: 69 U/L (ref 38–126)
ALT: 45 U/L — ABNORMAL HIGH (ref 0–44)
ALT: 48 U/L — ABNORMAL HIGH (ref 0–44)
ANION GAP: 13 (ref 5–15)
ANION GAP: 12 (ref 5–15)
AST: 117 U/L — ABNORMAL HIGH (ref 15–41)
AST: 94 U/L — ABNORMAL HIGH (ref 15–41)
Albumin: 3.6 g/dL (ref 3.5–5.0)
Albumin: 3.9 g/dL (ref 3.5–5.0)
Alkaline Phosphatase: 75 U/L (ref 38–126)
BILIRUBIN TOTAL: 0.7 mg/dL (ref 0.3–1.2)
BILIRUBIN TOTAL: 0.5 mg/dL (ref 0.3–1.2)
BUN: 11 mg/dL (ref 6–20)
BUN: 8 mg/dL (ref 6–20)
CALCIUM: 8.7 mg/dL — AB (ref 8.9–10.3)
CHLORIDE: 100 mmol/L (ref 98–111)
CO2: 20 mmol/L — ABNORMAL LOW (ref 22–32)
CO2: 23 mmol/L (ref 22–32)
Calcium: 9.1 mg/dL (ref 8.9–10.3)
Chloride: 104 mmol/L (ref 98–111)
Creatinine, Ser: 0.68 mg/dL (ref 0.61–1.24)
Creatinine, Ser: 0.72 mg/dL (ref 0.61–1.24)
GFR calc Af Amer: >60 mL/min (ref >60)
GFR calc non Af Amer: >60 mL/min (ref >60)
GFR calc non Af Amer: >60 mL/min (ref >60)
GLUCOSE: 91 mg/dL (ref 70–99)
Glucose, Bld: 90 mg/dL (ref 70–99)
POTASSIUM: 4.4 mmol/L (ref 3.5–5.1)
POTASSIUM: 4.2 mmol/L (ref 3.5–5.1)
SODIUM: 137 mmol/L (ref 135–145)
Sodium: 135 mmol/L (ref 135–145)
TOTAL PROTEIN: 7.1 g/dL (ref 6.5–8.1)
TOTAL PROTEIN: 7.4 g/dL (ref 6.5–8.1)

## 2018-11-07 LAB — CBC
HCT: 34 % — ABNORMAL LOW (ref 39.0–52.0)
Hemoglobin: 10.8 g/dL — ABNORMAL LOW (ref 13.0–17.0)
MCH: 31.2 pg (ref 26.0–34.0)
MCHC: 31.8 g/dL (ref 30.0–36.0)
MCV: 98.3 fL (ref 80.0–100.0)
Platelets: 207 10*3/uL (ref 150–400)
RBC: 3.46 MIL/uL — ABNORMAL LOW (ref 4.22–5.81)
RDW: 14 % (ref 11.5–15.5)
WBC: 6.6 10*3/uL (ref 4.0–10.5)
nRBC: 0 % (ref 0.0–0.2)

## 2018-11-07 LAB — ETHANOL: Alcohol, Ethyl (B): 313 mg/dL (ref <10)

## 2018-11-07 LAB — HIV ANTIBODY (ROUTINE TESTING W REFLEX): HIV Screen 4th Generation wRfx: NONREACTIVE

## 2018-11-07 MED ORDER — THIAMINE HCL 100 MG/ML IJ SOLN: 100.0000 mg | Freq: Every day | INTRAMUSCULAR | Status: DC

## 2018-11-07 MED ORDER — MORPHINE SULFATE (PF) 2 MG/ML IV SOLN
1.0000 mg | INTRAVENOUS | Status: DC | PRN
  Administered 2018-11-07: 2 mg via INTRAVENOUS
  Filled 2018-11-07: qty 1

## 2018-11-07 MED ORDER — VITAMIN B-1 100 MG PO TABS
100.0000 mg | ORAL_TABLET | Freq: Every day | ORAL | Status: DC
  Administered 2018-11-07 – 2018-11-09 (×3): 100 mg via ORAL
  Filled 2018-11-07 (×3): qty 1

## 2018-11-07 MED ORDER — FOLIC ACID 1 MG PO TABS
1.0000 mg | ORAL_TABLET | Freq: Every day | ORAL | Status: DC
  Administered 2018-11-07 – 2018-11-09 (×3): 1 mg via ORAL
  Filled 2018-11-07 (×3): qty 1

## 2018-11-07 MED ORDER — ACETAMINOPHEN 325 MG PO TABS: 650.0000 mg | ORAL_TABLET | Freq: Four times a day (QID) | ORAL | Status: DC | PRN

## 2018-11-07 MED ORDER — POTASSIUM CHLORIDE IN NACL 20-0.9 MEQ/L-% IV SOLN
INTRAVENOUS | Status: DC
  Administered 2018-11-07 – 2018-11-08 (×4): via INTRAVENOUS
  Filled 2018-11-07 (×4): qty 1000

## 2018-11-07 MED ORDER — ONDANSETRON HCL 4 MG/2ML IJ SOLN: 4.0000 mg | Freq: Four times a day (QID) | INTRAMUSCULAR | Status: DC | PRN

## 2018-11-07 MED ORDER — SODIUM CHLORIDE 0.9 % IV BOLUS
1000.0000 mL | Freq: Once | INTRAVENOUS | Status: AC
  Administered 2018-11-07: 1000 mL via INTRAVENOUS

## 2018-11-07 MED ORDER — LORAZEPAM 2 MG/ML IJ SOLN: 1.0000 mg | Freq: Four times a day (QID) | INTRAMUSCULAR | Status: DC | PRN

## 2018-11-07 MED ORDER — MORPHINE SULFATE (PF) 2 MG/ML IV SOLN
1.0000 mg | INTRAVENOUS | Status: DC | PRN
  Administered 2018-11-07 – 2018-11-08 (×4): 2 mg via INTRAVENOUS
  Filled 2018-11-07 (×4): qty 1

## 2018-11-07 MED ORDER — LORAZEPAM 1 MG PO TABS: 1.0000 mg | ORAL_TABLET | Freq: Four times a day (QID) | ORAL | Status: DC | PRN

## 2018-11-07 MED ORDER — ADULT MULTIVITAMIN W/MINERALS CH
1.0000 | ORAL_TABLET | Freq: Every day | ORAL | Status: DC
  Administered 2018-11-07 – 2018-11-09 (×3): 1 via ORAL
  Filled 2018-11-07 (×3): qty 1

## 2018-11-07 MED ORDER — OXYCODONE HCL 5 MG PO TABS
5.0000 mg | ORAL_TABLET | Freq: Four times a day (QID) | ORAL | Status: DC | PRN
  Administered 2018-11-07 – 2018-11-09 (×9): 10 mg via ORAL
  Filled 2018-11-07 (×9): qty 2

## 2018-11-07 MED ORDER — ONDANSETRON 4 MG PO TBDP: 4.0000 mg | ORAL_TABLET | Freq: Four times a day (QID) | ORAL | Status: DC | PRN

## 2018-11-07 NOTE — Consult Note (Signed)
Reason for Consult: Subarachnoid hemorrhage, subdural hematoma Referring Physician: Dr. Rosalio Macadamia is an 57 y.o. male.  HPI: The patient is a 57 year old black male who was intoxicated left evening and took a fall suffering right orbital and facial fractures.  Head CT was obtained which demonstrated a small subdural hematoma/subarachnoid hemorrhage.  A neurosurgical consultation was requested.  Presently the patient is alert and pleasant.  He complains of facial pain.  He denies neck pain, numbness, tingling, weakness, etc.  He is not anticoagulated.  Past Medical History:  Diagnosis Date  . Ankle injury   . Anxiety   . Arthritis   . Avascular necrosis ([place])   . Bipolar 1 disorder ([place])   . Depression   . Groin injury   . Hypertension   . Knee fracture   . PTSD (post-traumatic stress disorder)   . Schizophrenia [facility])     Past Surgical History:  Procedure Laterality Date  . KNEE SURGERY    . TOTAL HIP ARTHROPLASTY Right 01/20/2016   Procedure: RIGHT TOTAL HIP ARTHROPLASTY ANTERIOR APPROACH;  Surgeon: Mcarthur Rossetti, MD;  Location: [place];  Service: Orthopedics;  Laterality: Right;    Family History  Problem Relation Age of Onset  . Diabetes Mother   . Hypertension Mother   . Diabetes Father   . Hypertension Father     Social History:  reports that he has been smoking. He has never used smokeless tobacco. He reports that he drinks about 3.0 standard drinks of alcohol per week. He reports that he does not use drugs.  Allergies: No Known Allergies  Medications:  I have reviewed the patient's current medications. Prior to Admission:  Medications Prior to Admission  Medication Sig Dispense Refill Last Dose  . acetaminophen (TYLENOL) 500 MG tablet Take 500-1,000 mg by mouth daily as needed for moderate pain.   Past Week at Unknown time  . alendronate (FOSAMAX) 70 MG tablet Take 1 tablet (70 mg total) by mouth every 7 (seven) days. Take with a full glass of  water on an empty stomach. (Patient not taking: Reported on 05/19/2018) 4 tablet 11 Not Taking at Unknown time  . aspirin EC 325 MG EC tablet Take 1 tablet (325 mg total) by mouth 2 (two) times daily after a meal. (Patient not taking: Reported on 05/19/2018) 30 tablet 0 Not Taking at Unknown time  . calcium carbonate (TUMS) 500 MG chewable tablet Chew 1 tablet (200 mg of elemental calcium total) by mouth 3 (three) times daily with meals. (Patient not taking: Reported on 05/19/2018) 90 tablet prn Not Taking at Unknown time  . Cholecalciferol (VITAMIN D) 2000 units CAPS Take 1 capsule (2,000 Units total) by mouth daily. (Patient not taking: Reported on 05/19/2018) 30 capsule prn Not Taking at Unknown time  . dicyclomine (BENTYL) 20 MG tablet Take 1 tablet (20 mg total) by mouth 2 (two) times daily. (Patient not taking: Reported on 05/19/2018) 20 tablet 0 Not Taking at Unknown time  . ferrous sulfate 325 (65 FE) MG tablet Take 1 tablet (325 mg total) by mouth 2 (two) times daily with a meal. (Patient not taking: Reported on 05/19/2018) 120 tablet 0 Not Taking at Unknown time  . indomethacin (INDOCIN) 50 MG capsule Take 1 capsule (50 mg total) by mouth 2 (two) times daily with a meal. (Patient not taking: Reported on 05/19/2018) 30 capsule 0 Not Taking at Unknown time  . loperamide (IMODIUM) 2 MG capsule Take 1 capsule (2 mg total) by mouth  4 (four) times daily as needed for diarrhea or loose stools. (Patient not taking: Reported on 05/19/2018) 12 capsule 0 Not Taking at Unknown time  . Menthol, Topical Analgesic, (BIOFREEZE) 4 % GEL Apply 1 application topically 2 (two) times daily. AND twice daily as needed (Patient not taking: Reported on 05/19/2018) 1 Tube prn Not Taking at Unknown time  . naproxen (NAPROSYN) 375 MG tablet Take 1 tablet (375 mg total) by mouth 2 (two) times daily with a meal. (Patient not taking: Reported on 05/19/2018) 20 tablet 0 Not Taking at Unknown time  . ondansetron (ZOFRAN ODT) 4 MG  disintegrating tablet Take 1 tablet (4 mg total) by mouth every 8 (eight) hours as needed for nausea. (Patient not taking: Reported on 05/19/2018) 10 tablet 0 Not Taking at Unknown time  . sulfacetamide (BLEPH-10) 10 % ophthalmic solution Place 1-2 drops into the right eye every 2 (two) hours while awake. (Patient not taking: Reported on 05/19/2018) 10 mL 0 Not Taking at Unknown time   Scheduled: . folic acid  1 mg Oral Daily  . multivitamin with minerals  1 tablet Oral Daily  . thiamine  100 mg Oral Daily   Or  . thiamine  100 mg Intravenous Daily   Continuous: . 0.9 % NaCl with KCl 20 mEq / L 100 mL/hr at 11/07/18 0600   VUY:EBXIDHWYSHUOH, LORazepam **OR** LORazepam, morphine injection, ondansetron **OR** ondansetron (ZOFRAN) IV, oxyCODONE Anti-infectives (From admission, onward)   None       Results for orders placed or performed during the hospital encounter of 11/06/18 (from the past 48 hour(s))  Comprehensive metabolic panel     Status: Abnormal   Collection Time: 11/06/18 11:36 PM  Result Value Ref Range   Sodium 135 135 - 145 mmol/L   Potassium 4.2 3.5 - 5.1 mmol/L   Chloride 100 98 - 111 mmol/L   CO2 23 22 - 32 mmol/L   Glucose, Bld 90 70 - 99 mg/dL   BUN 11 6 - 20 mg/dL   Creatinine, Ser 0.72 0.61 - 1.24 mg/dL   Calcium 9.1 8.9 - 10.3 mg/dL   Total Protein 7.4 6.5 - 8.1 g/dL   Albumin 3.9 3.5 - 5.0 g/dL   AST 117 (H) 15 - 41 U/L   ALT 48 (H) 0 - 44 U/L   Alkaline Phosphatase 75 38 - 126 U/L   Total Bilirubin 0.5 0.3 - 1.2 mg/dL   GFR calc non Af Amer >60 >60 mL/min   GFR calc Af Amer >60 >60 mL/min    Comment: (NOTE) The eGFR has been calculated using the CKD EPI equation. This calculation has not been validated in all clinical situations. eGFR's persistently <60 mL/min signify possible Chronic Kidney Disease.    Anion gap 12 5 - 15    Comment: Performed at [facility] [address] [place], [place] [address]  CBC     Status: Abnormal   Collection  Time: 11/06/18 11:36 PM  Result Value Ref Range   WBC 5.5 4.0 - 10.5 K/uL   RBC 3.53 (L) 4.22 - 5.81 MIL/uL   Hemoglobin 10.9 (L) 13.0 - 17.0 g/dL   HCT 35.6 (L) 39.0 - 52.0 %   MCV 100.8 (H) 80.0 - 100.0 fL   MCH 30.9 26.0 - 34.0 pg   MCHC 30.6 30.0 - 36.0 g/dL   RDW 13.9 11.5 - 15.5 %   Platelets 189 150 - 400 K/uL   nRBC 0.0 0.0 - 0.2 %  Comment: Performed at [facility], [place] [address], [place], [place] [address]  Ethanol     Status: Abnormal   Collection Time: 11/06/18 11:36 PM  Result Value Ref Range   Alcohol, Ethyl (B) 313 (HH) <10 mg/dL    Comment: CRITICAL RESULT CALLED TO, READ BACK BY AND VERIFIED WITH: WALLACE S,RN 11/07/18 0016 WAYK (NOTE) Lowest detectable limit for serum alcohol is 10 mg/dL. For medical purposes only. Performed at [facility], [place] [address] [place], [place] [address]   Protime-INR     Status: None   Collection Time: 11/06/18 11:36 PM  Result Value Ref Range   Prothrombin Time 12.3 11.4 - 15.2 seconds   INR 0.92     Comment: Performed at [facility] [address] [place], [place] [address]  Sample to Blood Bank     Status: None   Collection Time: 11/06/18 11:37 PM  Result Value Ref Range   Blood Bank Specimen SAMPLE AVAILABLE FOR TESTING    Sample Expiration      11/07/2018 Performed at [facility], [place] [address] [place], [place] [address]   I-Stat Chem 8, ED     Status: Abnormal   Collection Time: 11/06/18 11:43 PM  Result Value Ref Range   Sodium 137 135 - 145 mmol/L   Potassium 4.3 3.5 - 5.1 mmol/L   Chloride 102 98 - 111 mmol/L   BUN 11 6 - 20 mg/dL   Creatinine, Ser 1.10 0.61 - 1.24 mg/dL   Glucose, Bld 84 70 - 99 mg/dL   Calcium, Ion 1.10 (L) 1.15 - 1.40 mmol/L   TCO2 25 22 - 32 mmol/L   Hemoglobin 13.3 13.0 - 17.0 g/dL   HCT 39.0 39.0 - 52.0 %  Urinalysis, Routine w reflex microscopic     Status: Abnormal   Collection Time: 11/07/18 12:24 AM  Result Value Ref Range   Color, Urine  COLORLESS (A) YELLOW   APPearance CLEAR CLEAR   Specific Gravity, Urine 1.004 (L) 1.005 - 1.030   pH 5.0 5.0 - 8.0   Glucose, UA NEGATIVE NEGATIVE mg/dL   Hgb urine dipstick NEGATIVE NEGATIVE   Bilirubin Urine NEGATIVE NEGATIVE   Ketones, ur NEGATIVE NEGATIVE mg/dL   Protein, ur NEGATIVE NEGATIVE mg/dL   Nitrite NEGATIVE NEGATIVE   Leukocytes, UA NEGATIVE NEGATIVE    Comment: Performed at [facility], [place] [address] [place], [place] [address]  Rapid urine drug screen (hospital performed)     Status: None   Collection Time: 11/07/18 12:24 AM  Result Value Ref Range   Opiates NONE DETECTED NONE DETECTED   Cocaine NONE DETECTED NONE DETECTED   Benzodiazepines NONE DETECTED NONE DETECTED   Amphetamines NONE DETECTED NONE DETECTED   Tetrahydrocannabinol NONE DETECTED NONE DETECTED   Barbiturates NONE DETECTED NONE DETECTED    Comment: (NOTE) DRUG SCREEN FOR MEDICAL PURPOSES ONLY.  IF CONFIRMATION IS NEEDED FOR ANY PURPOSE, NOTIFY LAB WITHIN 5 DAYS. LOWEST DETECTABLE LIMITS FOR URINE DRUG SCREEN Drug Class                     Cutoff (ng/mL) Amphetamine and metabolites    1000 Barbiturate and metabolites    200 Benzodiazepine                 544 Tricyclics and metabolites     300 Opiates and metabolites        300 Cocaine and metabolites  300 THC                            50 Performed at [facility], [place] [address] [place], [place] [address]   Comprehensive metabolic panel     Status: Abnormal   Collection Time: 11/07/18  5:28 AM  Result Value Ref Range   Sodium 137 135 - 145 mmol/L   Potassium 4.4 3.5 - 5.1 mmol/L   Chloride 104 98 - 111 mmol/L   CO2 20 (L) 22 - 32 mmol/L   Glucose, Bld 91 70 - 99 mg/dL   BUN 8 6 - 20 mg/dL   Creatinine, Ser 0.68 0.61 - 1.24 mg/dL   Calcium 8.7 (L) 8.9 - 10.3 mg/dL   Total Protein 7.1 6.5 - 8.1 g/dL   Albumin 3.6 3.5 - 5.0 g/dL   AST 94 (H) 15 - 41 U/L   ALT 45 (H) 0 - 44 U/L   Alkaline Phosphatase 69 38 - 126  U/L   Total Bilirubin 0.7 0.3 - 1.2 mg/dL   GFR calc non Af Amer >60 >60 mL/min   GFR calc Af Amer >60 >60 mL/min    Comment: (NOTE) The eGFR has been calculated using the CKD EPI equation. This calculation has not been validated in all clinical situations. eGFR's persistently <60 mL/min signify possible Chronic Kidney Disease.    Anion gap 13 5 - 15    Comment: Performed at [facility] [address] [place], [place] [address]  CBC     Status: Abnormal   Collection Time: 11/07/18  5:28 AM  Result Value Ref Range   WBC 6.6 4.0 - 10.5 K/uL   RBC 3.46 (L) 4.22 - 5.81 MIL/uL   Hemoglobin 10.8 (L) 13.0 - 17.0 g/dL   HCT 34.0 (L) 39.0 - 52.0 %   MCV 98.3 80.0 - 100.0 fL   MCH 31.2 26.0 - 34.0 pg   MCHC 31.8 30.0 - 36.0 g/dL   RDW 14.0 11.5 - 15.5 %   Platelets 207 150 - 400 K/uL   nRBC 0.0 0.0 - 0.2 %    Comment: Performed at [facility], [place] [address], [place], [place] [address]    Ct Head Wo Contrast  Result Date: 11/07/2018 CLINICAL DATA:  Pain after fall. EXAM: CT HEAD WITHOUT CONTRAST CT MAXILLOFACIAL WITHOUT CONTRAST CT CERVICAL SPINE WITHOUT CONTRAST TECHNIQUE: Multidetector CT imaging of the head, cervical spine, and maxillofacial structures were performed using the standard protocol without intravenous contrast. Multiplanar CT image reconstructions of the cervical spine and maxillofacial structures were also generated. COMPARISON:  August 31, 2015 FINDINGS: CT HEAD FINDINGS Brain: There is blood along the left side of the falx consistent with subdural blood. No other subdural or epidural blood identified. There is a small amount of subarachnoid hemorrhage posteriorly on the left on axial image 17. I suspect a small underlying hemorrhagic contusion. No other extra-axial hemorrhage noted. The ventricles are unremarkable. No blood in the ventricles identified. The cerebellum and brainstem are unremarkable. Basal cisterns are patent. No midline shift. No acute  cortical ischemia or infarct. Vascular: Calcified atherosclerosis in the intracranial carotids. Skull: Fractures in the right maxillary sinus will be described below. Other: There is soft tissue swelling over the right scalp and periorbital region. The underlying globe is intact. There is air in the soft tissue surrounding the right maxillary sinus consistent with fracture. Extracranial soft tissues otherwise normal.  CT MAXILLOFACIAL FINDINGS Osseous: There is a nondisplaced fracture through the anterior aspect of the right zygomatic arch as it joins the right maxillary sinus, as seen on axial image 48. There is also a nondisplaced fracture through the posterior aspect of the right zygomatic arch with fluid in a few adjacent anterior mastoid air cells. The mandible and left zygomatic arches are intact. Probable fracture at the base of the right nasal bone on axial image 47. There is a fracture through the body of the right pterygoid. This is best seen on sagittal image 34. Orbits: There is a fracture through the floor of the right orbit. There is also a fracture through the medial wall as seen on coronal image 28. There is a fracture through the posterolateral wall of the orbit as well as seen on coronal image 35. There is herniation of a small amount of fat through the right orbital floor fracture. The rectus muscle however is not herniated. The orbital floor fracture extends through the inferior orbital nerve foramen. The left orbit is intact. Sinuses: Multiple fractures are identified in the right maxillary sinus. There are fractures through the right orbital floor/right maxillary roof. This fracture is displaced. There is some herniated fat from the orbit but the inferior rectus muscle does not appear to be entrapped. There are fractures through the right maxillary inferior, anterior, posterior, and lateral walls as well. There is opacification of the right maxillary sinus. No other fractures associated with the  sinuses. Soft tissues: There is soft tissue swelling over the right side of the mandible and right side of the maxilla. The soft tissue swelling extends superiorly over the cheek and into the periorbital region and forehead. There is air within the swollen soft tissues, likely from the right maxillary sinus fractures which will be described above. Globes intact. CT CERVICAL SPINE FINDINGS Alignment: Normal. Skull base and vertebrae: No acute fracture. No primary bone lesion or focal pathologic process. Soft tissues and spinal canal: No prevertebral fluid or swelling. No visible canal hematoma. Disc levels:  Multilevel degenerative changes. Upper chest: Negative. Other: No other abnormalities. IMPRESSION: 1. There is a small amount of subdural blood along the left side of the falx. 2. There is a small amount of subarachnoid hemorrhage over the left posterior parietooccipital region. There may be a small underlying hemorrhagic contusion. 3. There is a tripod fracture involving the floor of the right orbit, multiple walls of the right maxillary sinus, and these anterior and posterior aspects of the right zygomatic arch. 4. There is a fracture through the inferior wall of the right orbit with a herniation of a small amount of fat. The rectus muscles not herniated. 5. Fractures through the right maxillary sinus involve the posterior, anterior, superior, and lateral walls. 6. There is a fracture through the right pterygoid which is nondisplaced. 7. There is a fracture through the base the right nasal bones. 8. No fracture or traumatic malalignment in the cervical spine. Findings called to Dr. Dina Rich. Electronically Signed   By: Dorise Bullion III M.D   On: 11/07/2018 01:27   Ct Cervical Spine Wo Contrast  Result Date: 11/07/2018 CLINICAL DATA:  Pain after fall. EXAM: CT HEAD WITHOUT CONTRAST CT MAXILLOFACIAL WITHOUT CONTRAST CT CERVICAL SPINE WITHOUT CONTRAST TECHNIQUE: Multidetector CT imaging of the head, cervical  spine, and maxillofacial structures were performed using the standard protocol without intravenous contrast. Multiplanar CT image reconstructions of the cervical spine and maxillofacial structures were also generated. COMPARISON:  August 31, 2015  FINDINGS: CT HEAD FINDINGS Brain: There is blood along the left side of the falx consistent with subdural blood. No other subdural or epidural blood identified. There is a small amount of subarachnoid hemorrhage posteriorly on the left on axial image 17. I suspect a small underlying hemorrhagic contusion. No other extra-axial hemorrhage noted. The ventricles are unremarkable. No blood in the ventricles identified. The cerebellum and brainstem are unremarkable. Basal cisterns are patent. No midline shift. No acute cortical ischemia or infarct. Vascular: Calcified atherosclerosis in the intracranial carotids. Skull: Fractures in the right maxillary sinus will be described below. Other: There is soft tissue swelling over the right scalp and periorbital region. The underlying globe is intact. There is air in the soft tissue surrounding the right maxillary sinus consistent with fracture. Extracranial soft tissues otherwise normal. CT MAXILLOFACIAL FINDINGS Osseous: There is a nondisplaced fracture through the anterior aspect of the right zygomatic arch as it joins the right maxillary sinus, as seen on axial image 48. There is also a nondisplaced fracture through the posterior aspect of the right zygomatic arch with fluid in a few adjacent anterior mastoid air cells. The mandible and left zygomatic arches are intact. Probable fracture at the base of the right nasal bone on axial image 47. There is a fracture through the body of the right pterygoid. This is best seen on sagittal image 34. Orbits: There is a fracture through the floor of the right orbit. There is also a fracture through the medial wall as seen on coronal image 28. There is a fracture through the posterolateral  wall of the orbit as well as seen on coronal image 35. There is herniation of a small amount of fat through the right orbital floor fracture. The rectus muscle however is not herniated. The orbital floor fracture extends through the inferior orbital nerve foramen. The left orbit is intact. Sinuses: Multiple fractures are identified in the right maxillary sinus. There are fractures through the right orbital floor/right maxillary roof. This fracture is displaced. There is some herniated fat from the orbit but the inferior rectus muscle does not appear to be entrapped. There are fractures through the right maxillary inferior, anterior, posterior, and lateral walls as well. There is opacification of the right maxillary sinus. No other fractures associated with the sinuses. Soft tissues: There is soft tissue swelling over the right side of the mandible and right side of the maxilla. The soft tissue swelling extends superiorly over the cheek and into the periorbital region and forehead. There is air within the swollen soft tissues, likely from the right maxillary sinus fractures which will be described above. Globes intact. CT CERVICAL SPINE FINDINGS Alignment: Normal. Skull base and vertebrae: No acute fracture. No primary bone lesion or focal pathologic process. Soft tissues and spinal canal: No prevertebral fluid or swelling. No visible canal hematoma. Disc levels:  Multilevel degenerative changes. Upper chest: Negative. Other: No other abnormalities. IMPRESSION: 1. There is a small amount of subdural blood along the left side of the falx. 2. There is a small amount of subarachnoid hemorrhage over the left posterior parietooccipital region. There may be a small underlying hemorrhagic contusion. 3. There is a tripod fracture involving the floor of the right orbit, multiple walls of the right maxillary sinus, and these anterior and posterior aspects of the right zygomatic arch. 4. There is a fracture through the inferior  wall of the right orbit with a herniation of a small amount of fat. The rectus muscles not herniated. 5.  Fractures through the right maxillary sinus involve the posterior, anterior, superior, and lateral walls. 6. There is a fracture through the right pterygoid which is nondisplaced. 7. There is a fracture through the base the right nasal bones. 8. No fracture or traumatic malalignment in the cervical spine. Findings called to Dr. Dina Rich. Electronically Signed   By: Dorise Bullion III M.D   On: 11/07/2018 01:27   Ct Maxillofacial Wo Contrast  Result Date: 11/07/2018 CLINICAL DATA:  Pain after fall. EXAM: CT HEAD WITHOUT CONTRAST CT MAXILLOFACIAL WITHOUT CONTRAST CT CERVICAL SPINE WITHOUT CONTRAST TECHNIQUE: Multidetector CT imaging of the head, cervical spine, and maxillofacial structures were performed using the standard protocol without intravenous contrast. Multiplanar CT image reconstructions of the cervical spine and maxillofacial structures were also generated. COMPARISON:  August 31, 2015 FINDINGS: CT HEAD FINDINGS Brain: There is blood along the left side of the falx consistent with subdural blood. No other subdural or epidural blood identified. There is a small amount of subarachnoid hemorrhage posteriorly on the left on axial image 17. I suspect a small underlying hemorrhagic contusion. No other extra-axial hemorrhage noted. The ventricles are unremarkable. No blood in the ventricles identified. The cerebellum and brainstem are unremarkable. Basal cisterns are patent. No midline shift. No acute cortical ischemia or infarct. Vascular: Calcified atherosclerosis in the intracranial carotids. Skull: Fractures in the right maxillary sinus will be described below. Other: There is soft tissue swelling over the right scalp and periorbital region. The underlying globe is intact. There is air in the soft tissue surrounding the right maxillary sinus consistent with fracture. Extracranial soft tissues  otherwise normal. CT MAXILLOFACIAL FINDINGS Osseous: There is a nondisplaced fracture through the anterior aspect of the right zygomatic arch as it joins the right maxillary sinus, as seen on axial image 48. There is also a nondisplaced fracture through the posterior aspect of the right zygomatic arch with fluid in a few adjacent anterior mastoid air cells. The mandible and left zygomatic arches are intact. Probable fracture at the base of the right nasal bone on axial image 47. There is a fracture through the body of the right pterygoid. This is best seen on sagittal image 34. Orbits: There is a fracture through the floor of the right orbit. There is also a fracture through the medial wall as seen on coronal image 28. There is a fracture through the posterolateral wall of the orbit as well as seen on coronal image 35. There is herniation of a small amount of fat through the right orbital floor fracture. The rectus muscle however is not herniated. The orbital floor fracture extends through the inferior orbital nerve foramen. The left orbit is intact. Sinuses: Multiple fractures are identified in the right maxillary sinus. There are fractures through the right orbital floor/right maxillary roof. This fracture is displaced. There is some herniated fat from the orbit but the inferior rectus muscle does not appear to be entrapped. There are fractures through the right maxillary inferior, anterior, posterior, and lateral walls as well. There is opacification of the right maxillary sinus. No other fractures associated with the sinuses. Soft tissues: There is soft tissue swelling over the right side of the mandible and right side of the maxilla. The soft tissue swelling extends superiorly over the cheek and into the periorbital region and forehead. There is air within the swollen soft tissues, likely from the right maxillary sinus fractures which will be described above. Globes intact. CT CERVICAL SPINE FINDINGS Alignment:  Normal. Skull base and  vertebrae: No acute fracture. No primary bone lesion or focal pathologic process. Soft tissues and spinal canal: No prevertebral fluid or swelling. No visible canal hematoma. Disc levels:  Multilevel degenerative changes. Upper chest: Negative. Other: No other abnormalities. IMPRESSION: 1. There is a small amount of subdural blood along the left side of the falx. 2. There is a small amount of subarachnoid hemorrhage over the left posterior parietooccipital region. There may be a small underlying hemorrhagic contusion. 3. There is a tripod fracture involving the floor of the right orbit, multiple walls of the right maxillary sinus, and these anterior and posterior aspects of the right zygomatic arch. 4. There is a fracture through the inferior wall of the right orbit with a herniation of a small amount of fat. The rectus muscles not herniated. 5. Fractures through the right maxillary sinus involve the posterior, anterior, superior, and lateral walls. 6. There is a fracture through the right pterygoid which is nondisplaced. 7. There is a fracture through the base the right nasal bones. 8. No fracture or traumatic malalignment in the cervical spine. Findings called to Dr. Dina Rich. Electronically Signed   By: Dorise Bullion III M.D   On: 11/07/2018 01:27    ROS: As above Blood pressure 135/66, pulse 85, temperature (!) 96.9 F (36.1 C), temperature source Temporal, resp. rate 14, SpO2 97 %. Estimated body mass index is 23.06 kg/m as calculated from the following:   Height as of 03/14/18: 6' (1.829 m).   Weight as of 03/14/18: 77.1 kg.  Physical Exam  General: An alert and pleasant 57 year old black male with facial swelling in a cervical collar.  HEENT: The patient has a right facial and periorbital swelling.  His right eye is swollen shut, I can see his pupil.  I do not see evidence of CSF otorrhea or rhinorrhea.  His left extraocular muscles are intact.  Neck: Supple without  masses or deformities.  He has an age-appropriate normal range of motion.  Spurling's testing is negative.  Thorax: Symmetric  Abdomen: Soft  Extremities: Unremarkable  Neurologic exam: The patient is alert and oriented x3.  Cranial nerves II through XII were examined and grossly normal except with a limited exam of his right eye because it is swollen shut.  The patient's motor strength is 5/5 in the bilateral biceps, triceps, handgrip, gastrocnemius, and dorsiflexors.  Sensory function is intact to light touch sensation all tested dermatomes bilaterally.  Cerebellar function is intact to rapid alternating movements of the upper extremities.  Imaging studies: I have reviewed the patient's head CT performed at [facility] today.  It demonstrates a small left occipital subarachnoid hemorrhage and subdural hematoma.  There is no significant mass-effect.  He has maxillary and facial fractures.  I reviewed the patient's cervical CT performed at [facility] today.  It demonstrates mild degenerative changes.  There is no fractures, subluxation, etc.  Assessment/Plan: Traumatic subarachnoid hemorrhage, subdural hematoma: The amount of intracranial bleeding is quite small.  It is very unlikely he will come to need surgery.  I would recommend repeating his head CAT scan tomorrow.  I have discussed this with Dr. Redmond Pulling.  Ophelia Charter 11/07/2018, 7:15 AM

## 2018-11-07 NOTE — ED Notes (Signed)
Pts brother updated on status; will visit in the morning

## 2018-11-07 NOTE — H&P (Signed)
History   Sean Mccoy is an 57 y.o. male.   Chief Complaint:  Chief Complaint  Patient presents with  . Altered Mental Status  . Fall  . Head Injury    HPI This is a 57 year old male who presents by EMS after a fall as a nonactivated trauma.  Per EMS, he was witnessed by bystander to fall face first from standing onto concrete walkway.  No known loss of consciousness.  EMS noted that he was very sleepy with pinpoint pupils. He was given 0.4 mg of Narcan with interval improvement.  Patient was able to tell EDP his name and follow commands but does not contribute to history taking.  He has obvious injury of the right side of his face.  He denies alcohol or drug use but was found to have alcohol on his person.  We were called to admit the patient because of the small amount of subarachnoid hemorrhage and numerous facial fractures.  He states that he takes oxycodone on a daily basis.  He states that he takes 1 pill 3 times a day of 7.5 mg.  He mainly complains of head pain and right facial pain.  He denies any neck pain, extremity pain, abdominal pain.  He denies any shortness of breath or chest pain.  He does not recall the events.  He states that he drinks but does not really drink daily.  He denies any drug use other than the prescription pain medication. Past Medical History:  Diagnosis Date  . Ankle injury   . Anxiety   . Arthritis   . Avascular necrosis ([place])   . Bipolar 1 disorder ([place])   . Depression   . Groin injury   . Hypertension   . Knee fracture   . PTSD (post-traumatic stress disorder)   . Schizophrenia [facility])     Past Surgical History:  Procedure Laterality Date  . KNEE SURGERY    . TOTAL HIP ARTHROPLASTY Right 01/20/2016   Procedure: RIGHT TOTAL HIP ARTHROPLASTY ANTERIOR APPROACH;  Surgeon: Mcarthur Rossetti, MD;  Location: [place];  Service: Orthopedics;  Laterality: Right;    Family History  Problem Relation Age of Onset  . Diabetes Mother   . Hypertension  Mother   . Diabetes Father   . Hypertension Father    Social History:  reports that he has been smoking. He has never used smokeless tobacco. He reports that he drinks about 3.0 standard drinks of alcohol per week. He reports that he does not use drugs.  Allergies  No Known Allergies  Home Medications   (Not in a hospital admission)  Trauma Course   Results for orders placed or performed during the hospital encounter of 11/06/18 (from the past 48 hour(s))  Comprehensive metabolic panel     Status: Abnormal   Collection Time: 11/06/18 11:36 PM  Result Value Ref Range   Sodium 135 135 - 145 mmol/L   Potassium 4.2 3.5 - 5.1 mmol/L   Chloride 100 98 - 111 mmol/L   CO2 23 22 - 32 mmol/L   Glucose, Bld 90 70 - 99 mg/dL   BUN 11 6 - 20 mg/dL   Creatinine, Ser 0.72 0.61 - 1.24 mg/dL   Calcium 9.1 8.9 - 10.3 mg/dL   Total Protein 7.4 6.5 - 8.1 g/dL   Albumin 3.9 3.5 - 5.0 g/dL   AST 117 (H) 15 - 41 U/L   ALT 48 (H) 0 - 44 U/L   Alkaline Phosphatase 75 38 -  126 U/L   Total Bilirubin 0.5 0.3 - 1.2 mg/dL   GFR calc non Af Amer >60 >60 mL/min   GFR calc Af Amer >60 >60 mL/min    Comment: (NOTE) The eGFR has been calculated using the CKD EPI equation. This calculation has not been validated in all clinical situations. eGFR's persistently <60 mL/min signify possible Chronic Kidney Disease.    Anion gap 12 5 - 15    Comment: Performed at [facility] [address], [place], [place] [address]  CBC     Status: Abnormal   Collection Time: 11/06/18 11:36 PM  Result Value Ref Range   WBC 5.5 4.0 - 10.5 K/uL   RBC 3.53 (L) 4.22 - 5.81 MIL/uL   Hemoglobin 10.9 (L) 13.0 - 17.0 g/dL   HCT 35.6 (L) 39.0 - 52.0 %   MCV 100.8 (H) 80.0 - 100.0 fL   MCH 30.9 26.0 - 34.0 pg   MCHC 30.6 30.0 - 36.0 g/dL   RDW 13.9 11.5 - 15.5 %   Platelets 189 150 - 400 K/uL   nRBC 0.0 0.0 - 0.2 %    Comment: Performed at [facility], [place] [address], [place], [place] [address]  Ethanol      Status: Abnormal   Collection Time: 11/06/18 11:36 PM  Result Value Ref Range   Alcohol, Ethyl (B) 313 (HH) <10 mg/dL    Comment: CRITICAL RESULT CALLED TO, READ BACK BY AND VERIFIED WITH: WALLACE S,RN 11/07/18 0016 WAYK (NOTE) Lowest detectable limit for serum alcohol is 10 mg/dL. For medical purposes only. Performed at [facility], [place] [address] [place], [place] [address]   Protime-INR     Status: None   Collection Time: 11/06/18 11:36 PM  Result Value Ref Range   Prothrombin Time 12.3 11.4 - 15.2 seconds   INR 0.92     Comment: Performed at [facility] [address], [place], [place] [address]  Sample to Blood Bank     Status: None   Collection Time: 11/06/18 11:37 PM  Result Value Ref Range   Blood Bank Specimen SAMPLE AVAILABLE FOR TESTING    Sample Expiration      11/07/2018 Performed at [facility], [place] [address] [place], [place] [address]   I-Stat Chem 8, ED     Status: Abnormal   Collection Time: 11/06/18 11:43 PM  Result Value Ref Range   Sodium 137 135 - 145 mmol/L   Potassium 4.3 3.5 - 5.1 mmol/L   Chloride 102 98 - 111 mmol/L   BUN 11 6 - 20 mg/dL   Creatinine, Ser 1.10 0.61 - 1.24 mg/dL   Glucose, Bld 84 70 - 99 mg/dL   Calcium, Ion 1.10 (L) 1.15 - 1.40 mmol/L   TCO2 25 22 - 32 mmol/L   Hemoglobin 13.3 13.0 - 17.0 g/dL   HCT 39.0 39.0 - 52.0 %  Urinalysis, Routine w reflex microscopic     Status: Abnormal   Collection Time: 11/07/18 12:24 AM  Result Value Ref Range   Color, Urine COLORLESS (A) YELLOW   APPearance CLEAR CLEAR   Specific Gravity, Urine 1.004 (L) 1.005 - 1.030   pH 5.0 5.0 - 8.0   Glucose, UA NEGATIVE NEGATIVE mg/dL   Hgb urine dipstick NEGATIVE NEGATIVE   Bilirubin Urine NEGATIVE NEGATIVE   Ketones, ur NEGATIVE NEGATIVE mg/dL   Protein, ur NEGATIVE NEGATIVE mg/dL   Nitrite NEGATIVE NEGATIVE   Leukocytes, UA NEGATIVE  NEGATIVE    Comment: Performed at [facility], [place] [address], [place], [place]  [address]  Rapid urine drug screen (hospital performed)     Status: None   Collection Time: 11/07/18 12:24 AM  Result Value Ref Range   Opiates NONE DETECTED NONE DETECTED   Cocaine NONE DETECTED NONE DETECTED   Benzodiazepines NONE DETECTED NONE DETECTED   Amphetamines NONE DETECTED NONE DETECTED   Tetrahydrocannabinol NONE DETECTED NONE DETECTED   Barbiturates NONE DETECTED NONE DETECTED    Comment: (NOTE) DRUG SCREEN FOR MEDICAL PURPOSES ONLY.  IF CONFIRMATION IS NEEDED FOR ANY PURPOSE, NOTIFY LAB WITHIN 5 DAYS. LOWEST DETECTABLE LIMITS FOR URINE DRUG SCREEN Drug Class                     Cutoff (ng/mL) Amphetamine and metabolites    1000 Barbiturate and metabolites    200 Benzodiazepine                 025 Tricyclics and metabolites     300 Opiates and metabolites        300 Cocaine and metabolites        300 THC                            50 Performed at [facility], [place] [address] [place], [place] [address]    Ct Head Wo Contrast  Result Date: 11/07/2018 CLINICAL DATA:  Pain after fall. EXAM: CT HEAD WITHOUT CONTRAST CT MAXILLOFACIAL WITHOUT CONTRAST CT CERVICAL SPINE WITHOUT CONTRAST TECHNIQUE: Multidetector CT imaging of the head, cervical spine, and maxillofacial structures were performed using the standard protocol without intravenous contrast. Multiplanar CT image reconstructions of the cervical spine and maxillofacial structures were also generated. COMPARISON:  August 31, 2015 FINDINGS: CT HEAD FINDINGS Brain: There is blood along the left side of the falx consistent with subdural blood. No other subdural or epidural blood identified. There is a small amount of subarachnoid hemorrhage posteriorly on the left on axial image 17. I suspect a small underlying hemorrhagic contusion. No other extra-axial hemorrhage noted. The ventricles are unremarkable. No blood in the ventricles identified. The cerebellum and brainstem are unremarkable. Basal cisterns are patent. No  midline shift. No acute cortical ischemia or infarct. Vascular: Calcified atherosclerosis in the intracranial carotids. Skull: Fractures in the right maxillary sinus will be described below. Other: There is soft tissue swelling over the right scalp and periorbital region. The underlying globe is intact. There is air in the soft tissue surrounding the right maxillary sinus consistent with fracture. Extracranial soft tissues otherwise normal. CT MAXILLOFACIAL FINDINGS Osseous: There is a nondisplaced fracture through the anterior aspect of the right zygomatic arch as it joins the right maxillary sinus, as seen on axial image 48. There is also a nondisplaced fracture through the posterior aspect of the right zygomatic arch with fluid in a few adjacent anterior mastoid air cells. The mandible and left zygomatic arches are intact. Probable fracture at the base of the right nasal bone on axial image 47. There is a fracture through the body of the right pterygoid. This is best seen on sagittal image 34. Orbits: There is a fracture through the floor of the right orbit. There is also a fracture through the medial wall as seen on coronal image 28. There is a fracture through the posterolateral wall of the orbit as well as seen on coronal image 35. There  is herniation of a small amount of fat through the right orbital floor fracture. The rectus muscle however is not herniated. The orbital floor fracture extends through the inferior orbital nerve foramen. The left orbit is intact. Sinuses: Multiple fractures are identified in the right maxillary sinus. There are fractures through the right orbital floor/right maxillary roof. This fracture is displaced. There is some herniated fat from the orbit but the inferior rectus muscle does not appear to be entrapped. There are fractures through the right maxillary inferior, anterior, posterior, and lateral walls as well. There is opacification of the right maxillary sinus. No other  fractures associated with the sinuses. Soft tissues: There is soft tissue swelling over the right side of the mandible and right side of the maxilla. The soft tissue swelling extends superiorly over the cheek and into the periorbital region and forehead. There is air within the swollen soft tissues, likely from the right maxillary sinus fractures which will be described above. Globes intact. CT CERVICAL SPINE FINDINGS Alignment: Normal. Skull base and vertebrae: No acute fracture. No primary bone lesion or focal pathologic process. Soft tissues and spinal canal: No prevertebral fluid or swelling. No visible canal hematoma. Disc levels:  Multilevel degenerative changes. Upper chest: Negative. Other: No other abnormalities. IMPRESSION: 1. There is a small amount of subdural blood along the left side of the falx. 2. There is a small amount of subarachnoid hemorrhage over the left posterior parietooccipital region. There may be a small underlying hemorrhagic contusion. 3. There is a tripod fracture involving the floor of the right orbit, multiple walls of the right maxillary sinus, and these anterior and posterior aspects of the right zygomatic arch. 4. There is a fracture through the inferior wall of the right orbit with a herniation of a small amount of fat. The rectus muscles not herniated. 5. Fractures through the right maxillary sinus involve the posterior, anterior, superior, and lateral walls. 6. There is a fracture through the right pterygoid which is nondisplaced. 7. There is a fracture through the base the right nasal bones. 8. No fracture or traumatic malalignment in the cervical spine. Findings called to Dr. Dina Rich. Electronically Signed   By: Dorise Bullion III M.D   On: 11/07/2018 01:27   Ct Cervical Spine Wo Contrast  Result Date: 11/07/2018 CLINICAL DATA:  Pain after fall. EXAM: CT HEAD WITHOUT CONTRAST CT MAXILLOFACIAL WITHOUT CONTRAST CT CERVICAL SPINE WITHOUT CONTRAST TECHNIQUE: Multidetector CT  imaging of the head, cervical spine, and maxillofacial structures were performed using the standard protocol without intravenous contrast. Multiplanar CT image reconstructions of the cervical spine and maxillofacial structures were also generated. COMPARISON:  August 31, 2015 FINDINGS: CT HEAD FINDINGS Brain: There is blood along the left side of the falx consistent with subdural blood. No other subdural or epidural blood identified. There is a small amount of subarachnoid hemorrhage posteriorly on the left on axial image 17. I suspect a small underlying hemorrhagic contusion. No other extra-axial hemorrhage noted. The ventricles are unremarkable. No blood in the ventricles identified. The cerebellum and brainstem are unremarkable. Basal cisterns are patent. No midline shift. No acute cortical ischemia or infarct. Vascular: Calcified atherosclerosis in the intracranial carotids. Skull: Fractures in the right maxillary sinus will be described below. Other: There is soft tissue swelling over the right scalp and periorbital region. The underlying globe is intact. There is air in the soft tissue surrounding the right maxillary sinus consistent with fracture. Extracranial soft tissues otherwise normal. CT MAXILLOFACIAL FINDINGS Osseous: There  is a nondisplaced fracture through the anterior aspect of the right zygomatic arch as it joins the right maxillary sinus, as seen on axial image 48. There is also a nondisplaced fracture through the posterior aspect of the right zygomatic arch with fluid in a few adjacent anterior mastoid air cells. The mandible and left zygomatic arches are intact. Probable fracture at the base of the right nasal bone on axial image 47. There is a fracture through the body of the right pterygoid. This is best seen on sagittal image 34. Orbits: There is a fracture through the floor of the right orbit. There is also a fracture through the medial wall as seen on coronal image 28. There is a fracture  through the posterolateral wall of the orbit as well as seen on coronal image 35. There is herniation of a small amount of fat through the right orbital floor fracture. The rectus muscle however is not herniated. The orbital floor fracture extends through the inferior orbital nerve foramen. The left orbit is intact. Sinuses: Multiple fractures are identified in the right maxillary sinus. There are fractures through the right orbital floor/right maxillary roof. This fracture is displaced. There is some herniated fat from the orbit but the inferior rectus muscle does not appear to be entrapped. There are fractures through the right maxillary inferior, anterior, posterior, and lateral walls as well. There is opacification of the right maxillary sinus. No other fractures associated with the sinuses. Soft tissues: There is soft tissue swelling over the right side of the mandible and right side of the maxilla. The soft tissue swelling extends superiorly over the cheek and into the periorbital region and forehead. There is air within the swollen soft tissues, likely from the right maxillary sinus fractures which will be described above. Globes intact. CT CERVICAL SPINE FINDINGS Alignment: Normal. Skull base and vertebrae: No acute fracture. No primary bone lesion or focal pathologic process. Soft tissues and spinal canal: No prevertebral fluid or swelling. No visible canal hematoma. Disc levels:  Multilevel degenerative changes. Upper chest: Negative. Other: No other abnormalities. IMPRESSION: 1. There is a small amount of subdural blood along the left side of the falx. 2. There is a small amount of subarachnoid hemorrhage over the left posterior parietooccipital region. There may be a small underlying hemorrhagic contusion. 3. There is a tripod fracture involving the floor of the right orbit, multiple walls of the right maxillary sinus, and these anterior and posterior aspects of the right zygomatic arch. 4. There is a  fracture through the inferior wall of the right orbit with a herniation of a small amount of fat. The rectus muscles not herniated. 5. Fractures through the right maxillary sinus involve the posterior, anterior, superior, and lateral walls. 6. There is a fracture through the right pterygoid which is nondisplaced. 7. There is a fracture through the base the right nasal bones. 8. No fracture or traumatic malalignment in the cervical spine. Findings called to Dr. Dina Rich. Electronically Signed   By: Dorise Bullion III M.D   On: 11/07/2018 01:27   Ct Maxillofacial Wo Contrast  Result Date: 11/07/2018 CLINICAL DATA:  Pain after fall. EXAM: CT HEAD WITHOUT CONTRAST CT MAXILLOFACIAL WITHOUT CONTRAST CT CERVICAL SPINE WITHOUT CONTRAST TECHNIQUE: Multidetector CT imaging of the head, cervical spine, and maxillofacial structures were performed using the standard protocol without intravenous contrast. Multiplanar CT image reconstructions of the cervical spine and maxillofacial structures were also generated. COMPARISON:  August 31, 2015 FINDINGS: CT HEAD FINDINGS Brain: There  is blood along the left side of the falx consistent with subdural blood. No other subdural or epidural blood identified. There is a small amount of subarachnoid hemorrhage posteriorly on the left on axial image 17. I suspect a small underlying hemorrhagic contusion. No other extra-axial hemorrhage noted. The ventricles are unremarkable. No blood in the ventricles identified. The cerebellum and brainstem are unremarkable. Basal cisterns are patent. No midline shift. No acute cortical ischemia or infarct. Vascular: Calcified atherosclerosis in the intracranial carotids. Skull: Fractures in the right maxillary sinus will be described below. Other: There is soft tissue swelling over the right scalp and periorbital region. The underlying globe is intact. There is air in the soft tissue surrounding the right maxillary sinus consistent with fracture.  Extracranial soft tissues otherwise normal. CT MAXILLOFACIAL FINDINGS Osseous: There is a nondisplaced fracture through the anterior aspect of the right zygomatic arch as it joins the right maxillary sinus, as seen on axial image 48. There is also a nondisplaced fracture through the posterior aspect of the right zygomatic arch with fluid in a few adjacent anterior mastoid air cells. The mandible and left zygomatic arches are intact. Probable fracture at the base of the right nasal bone on axial image 47. There is a fracture through the body of the right pterygoid. This is best seen on sagittal image 34. Orbits: There is a fracture through the floor of the right orbit. There is also a fracture through the medial wall as seen on coronal image 28. There is a fracture through the posterolateral wall of the orbit as well as seen on coronal image 35. There is herniation of a small amount of fat through the right orbital floor fracture. The rectus muscle however is not herniated. The orbital floor fracture extends through the inferior orbital nerve foramen. The left orbit is intact. Sinuses: Multiple fractures are identified in the right maxillary sinus. There are fractures through the right orbital floor/right maxillary roof. This fracture is displaced. There is some herniated fat from the orbit but the inferior rectus muscle does not appear to be entrapped. There are fractures through the right maxillary inferior, anterior, posterior, and lateral walls as well. There is opacification of the right maxillary sinus. No other fractures associated with the sinuses. Soft tissues: There is soft tissue swelling over the right side of the mandible and right side of the maxilla. The soft tissue swelling extends superiorly over the cheek and into the periorbital region and forehead. There is air within the swollen soft tissues, likely from the right maxillary sinus fractures which will be described above. Globes intact. CT CERVICAL  SPINE FINDINGS Alignment: Normal. Skull base and vertebrae: No acute fracture. No primary bone lesion or focal pathologic process. Soft tissues and spinal canal: No prevertebral fluid or swelling. No visible canal hematoma. Disc levels:  Multilevel degenerative changes. Upper chest: Negative. Other: No other abnormalities. IMPRESSION: 1. There is a small amount of subdural blood along the left side of the falx. 2. There is a small amount of subarachnoid hemorrhage over the left posterior parietooccipital region. There may be a small underlying hemorrhagic contusion. 3. There is a tripod fracture involving the floor of the right orbit, multiple walls of the right maxillary sinus, and these anterior and posterior aspects of the right zygomatic arch. 4. There is a fracture through the inferior wall of the right orbit with a herniation of a small amount of fat. The rectus muscles not herniated. 5. Fractures through the right maxillary sinus  involve the posterior, anterior, superior, and lateral walls. 6. There is a fracture through the right pterygoid which is nondisplaced. 7. There is a fracture through the base the right nasal bones. 8. No fracture or traumatic malalignment in the cervical spine. Findings called to Dr. Dina Rich. Electronically Signed   By: Dorise Bullion III M.D   On: 11/07/2018 01:27    Review of Systems  Unable to perform ROS: Medical condition  All other systems reviewed and are negative.   Blood pressure 134/75, pulse 97, temperature (!) 96.9 F (36.1 C), temperature source Temporal, resp. rate 15, SpO2 96 %. Physical Exam  Vitals reviewed. Constitutional: He is oriented to person, place, and time. He appears well-developed and well-nourished. No distress.  HENT:  Head: Normocephalic. Head is with abrasion and with contusion.    Right Ear: External ear normal.  Left Ear: External ear normal.  Nose: Nose normal.  Rt periorbital fascial swelling/TTP; abrasion Rt temporal region    Eyes: Pupils are equal, round, and reactive to light. Conjunctivae are normal. No scleral icterus.  Neck: Normal range of motion and full passive range of motion without pain. Neck supple. No tracheal deviation present. No thyromegaly present.  c collar off  Cardiovascular: Normal rate, normal heart sounds and intact distal pulses.  Respiratory: Effort normal and breath sounds normal. No stridor. No respiratory distress. He has no wheezes.  GI: Soft. He exhibits no distension. There is no tenderness. There is no rebound and no guarding.  Musculoskeletal: He exhibits no edema or tenderness.  Lymphadenopathy:    He has no cervical adenopathy.  Neurological: He is alert and oriented to person, place, and time. He exhibits normal muscle tone.  Skin: Skin is warm and dry. No rash noted. He is not diaphoretic. No erythema. No pallor.  Psychiatric: He has a normal mood and affect. His behavior is normal. Judgment and thought content normal.     Assessment/Plan Fall Intoxication Small L SDH Small L SAH Multiple Rt facial fractures Anemia of unknown etiology Elevated transaminases  Admit icu Serial neuro exams Dr Arnoldo Morale to see in AM - repeat head CT wednesday Dr Marla Roe to see in AM as well CIWA protocol Repeat labs in am Can probably tx to SDU or floor later today  Just got 90 tabs of oxy 7.5 by Mohammed Kindle on 11/01/18   Leighton Ruff. Redmond Pulling, MD, FACS General, Bariatric, & Minimally Invasive Surgery [facility]   Greer Pickerel 11/07/2018, 4:12 AM   Procedures

## 2018-11-07 NOTE — Progress Notes (Addendum)
CSW spoke with pt at pt's bedside. Pt's brother at bedside. Pt agreed to have brother remain at bedside during assessment.   Pt informed CSW that he was supposed to be a witness in a civil case. Pt stated he had been subpoenaed by the [employer]. Pt stated he has been talking to [employer] about the case. Pt asked CSW to reach out to Juanetta Beets to inform him that pt is currently in the hospital and he was unable to make it to court today. Pt expressed concern about missing the court date and worry that a bench warrant might be issued for him.   CSW called the [employer]. CSW was transferred to Fontaine No, Editor, commissioning, [phone]. CSW left HIPAA complaint voicemail for [employer]. Per person working at [employer], Juanetta Beets is an attorney hired by the [employer].   CSW completed SBIRT with pt. Pt reported drinking a couple of beers prior to his accident, earlier in the day. Pt denied that drinking caused his accident and tripping. CSW discussed pt's Blood Alcohol Level. Pt denied his alcohol intake impacted his fall. Pt stated that a past hip surgery caused his leg to drag and make him trip. He said he was on his way to the grocery store. Pt stated he has a few beers a day. Pt stated that how many beers he drinks depends on how he feels that day. Pt reported that he has done things a couple months ago to upset family while drinking. Pt did not disclose details.   Pt is open to receiving resources to assist with quitting drinking. Pt does not want AA resources, stated that he tried that in the past and was unsuccessful. Pt requested resources provided at discharge.   Pt is currently living in a rooming house. Pt stated that it is a stable situation. Pt is moving to a new apartment with the support of the [employer] in the next coming months.   CSW received phone call back from Fontaine No  with [employer]. Juanetta Beets attorney, [phone]. CSW spoke with Juanetta Beets, who informed a bench warrant will not be issued for pt and he will be in touch with pt in the next couple of weeks. CSW updated pt.   Montine Circle, [facility]  [phone]

## 2018-11-07 NOTE — Consult Note (Signed)
Reason for Consult:Facial trauma Referring Physician: Dr. Eric Wilson  Sean Mccoy is an 57 y.o. male.  HPI: The patient is a 57 yrs old bm here for treatment after a fall.  He fell on his face striking the concrete walkway.  He did not have loss of consciousness.  He was recovered by EMS who reported he was sleepy and had pinpoint pupils.  Narcan was given and showed some improvement.  He was brought to the ED and CT showed the following: small amount of subdural blood along the left side of the falx and subarachnoid hemorrhage over the left posterior parietooccipital region. Tripod fracture involving the floor of the right orbit, multiple walls of the right maxillary sinus, and anterior and posterior aspects of the right zygomatic arch.  There is a fracture through the inferior wall of the right orbit. Fractures through the right maxillary sinus involve the posterior, anterior, superior, and lateral walls.  There is a nondisplaced fracture through the right pterygoid and base of the right nasal bone.  He is feeling better.  His dental alignment is improving but does not feel normal yet.  He has right sided facial swelling.  No EOM entrapment noted.  Past Medical History:  Diagnosis Date  . Ankle injury   . Anxiety   . Arthritis   . Avascular necrosis (HCC)   . Bipolar 1 disorder (HCC)   . Depression   . Groin injury   . Hypertension   . Knee fracture   . PTSD (post-traumatic stress disorder)   . Schizophrenia (HCC)     Past Surgical History:  Procedure Laterality Date  . KNEE SURGERY    . TOTAL HIP ARTHROPLASTY Right 01/20/2016   Procedure: RIGHT TOTAL HIP ARTHROPLASTY ANTERIOR APPROACH;  Surgeon: Christopher Y Blackman, MD;  Location: [facility] OR;  Service: Orthopedics;  Laterality: Right;    Family History  Problem Relation Age of Onset  . Diabetes Mother   . Hypertension Mother   . Diabetes Father   . Hypertension Father     Social History:  reports that he has been smoking. He has  never used smokeless tobacco. He reports that he drinks about 3.0 standard drinks of alcohol per week. He reports that he does not use drugs.  Allergies: No Known Allergies  Medications: I have reviewed the patient's current medications.  Results for orders placed or performed during the hospital encounter of 11/06/18 (from the past 48 hour(s))  Comprehensive metabolic panel     Status: Abnormal   Collection Time: 11/06/18 11:36 PM  Result Value Ref Range   Sodium 135 135 - 145 mmol/L   Potassium 4.2 3.5 - 5.1 mmol/L   Chloride 100 98 - 111 mmol/L   CO2 23 22 - 32 mmol/L   Glucose, Bld 90 70 - 99 mg/dL   BUN 11 6 - 20 mg/dL   Creatinine, Ser 0.72 0.61 - 1.24 mg/dL   Calcium 9.1 8.9 - 10.3 mg/dL   Total Protein 7.4 6.5 - 8.1 g/dL   Albumin 3.9 3.5 - 5.0 g/dL   AST 117 (H) 15 - 41 U/L   ALT 48 (H) 0 - 44 U/L   Alkaline Phosphatase 75 38 - 126 U/L   Total Bilirubin 0.5 0.3 - 1.2 mg/dL   GFR calc non Af Amer >60 >60 mL/min   GFR calc Af Amer >60 >60 mL/min    Comment: (NOTE) The eGFR has been calculated using the CKD EPI equation. This calculation has not been   validated in all clinical situations. eGFR's persistently <60 mL/min signify possible Chronic Kidney Disease.    Anion gap 12 5 - 15    Comment: Performed at [facility], [address] [place], [place] [address]  CBC     Status: Abnormal   Collection Time: 11/06/18 11:36 PM  Result Value Ref Range   WBC 5.5 4.0 - 10.5 K/uL   RBC 3.53 (L) 4.22 - 5.81 MIL/uL   Hemoglobin 10.9 (L) 13.0 - 17.0 g/dL   HCT 35.6 (L) 39.0 - 52.0 %   MCV 100.8 (H) 80.0 - 100.0 fL   MCH 30.9 26.0 - 34.0 pg   MCHC 30.6 30.0 - 36.0 g/dL   RDW 13.9 11.5 - 15.5 %   Platelets 189 150 - 400 K/uL   nRBC 0.0 0.0 - 0.2 %    Comment: Performed at [facility], [address] [place], [place] [address]  Ethanol     Status: Abnormal   Collection Time: 11/06/18 11:36 PM  Result Value Ref Range   Alcohol, Ethyl (B) 313 (HH) <10 mg/dL     Comment: CRITICAL RESULT CALLED TO, READ BACK BY AND VERIFIED WITH: WALLACE S,RN 11/07/18 0016 WAYK (NOTE) Lowest detectable limit for serum alcohol is 10 mg/dL. For medical purposes only. Performed at [facility], [address] [place], [place] [address]   Protime-INR     Status: None   Collection Time: 11/06/18 11:36 PM  Result Value Ref Range   Prothrombin Time 12.3 11.4 - 15.2 seconds   INR 0.92     Comment: Performed at [facility], [address] [place], [place] [address]  Sample to Blood Bank     Status: None   Collection Time: 11/06/18 11:37 PM  Result Value Ref Range   Blood Bank Specimen SAMPLE AVAILABLE FOR TESTING    Sample Expiration      11/07/2018 Performed at [facility], [address] [place], [place] [address]   I-Stat Chem 8, ED     Status: Abnormal   Collection Time: 11/06/18 11:43 PM  Result Value Ref Range   Sodium 137 135 - 145 mmol/L   Potassium 4.3 3.5 - 5.1 mmol/L   Chloride 102 98 - 111 mmol/L   BUN 11 6 - 20 mg/dL   Creatinine, Ser 1.10 0.61 - 1.24 mg/dL   Glucose, Bld 84 70 - 99 mg/dL   Calcium, Ion 1.10 (L) 1.15 - 1.40 mmol/L   TCO2 25 22 - 32 mmol/L   Hemoglobin 13.3 13.0 - 17.0 g/dL   HCT 39.0 39.0 - 52.0 %  Urinalysis, Routine w reflex microscopic     Status: Abnormal   Collection Time: 11/07/18 12:24 AM  Result Value Ref Range   Color, Urine COLORLESS (A) YELLOW   APPearance CLEAR CLEAR   Specific Gravity, Urine 1.004 (L) 1.005 - 1.030   pH 5.0 5.0 - 8.0   Glucose, UA NEGATIVE NEGATIVE mg/dL   Hgb urine dipstick NEGATIVE NEGATIVE   Bilirubin Urine NEGATIVE NEGATIVE   Ketones, ur NEGATIVE NEGATIVE mg/dL   Protein, ur NEGATIVE NEGATIVE mg/dL   Nitrite NEGATIVE NEGATIVE   Leukocytes, UA NEGATIVE NEGATIVE    Comment: Performed at [facility], [address] [place], [place] [address]  Rapid urine drug screen (hospital performed)     Status: None   Collection Time: 11/07/18 12:24 AM  Result Value Ref  Range   Opiates NONE DETECTED NONE   DETECTED   Cocaine NONE DETECTED NONE DETECTED   Benzodiazepines NONE DETECTED NONE DETECTED   Amphetamines NONE DETECTED NONE DETECTED   Tetrahydrocannabinol NONE DETECTED NONE DETECTED   Barbiturates NONE DETECTED NONE DETECTED    Comment: (NOTE) DRUG SCREEN FOR MEDICAL PURPOSES ONLY.  IF CONFIRMATION IS NEEDED FOR ANY PURPOSE, NOTIFY LAB WITHIN 5 DAYS. LOWEST DETECTABLE LIMITS FOR URINE DRUG SCREEN Drug Class                     Cutoff (ng/mL) Amphetamine and metabolites    1000 Barbiturate and metabolites    200 Benzodiazepine                 200 Tricyclics and metabolites     300 Opiates and metabolites        300 Cocaine and metabolites        300 THC                            50 Performed at [facility], [address] [place], [place] [address]   HIV antibody (Routine Testing)     Status: None   Collection Time: 11/07/18  5:28 AM  Result Value Ref Range   HIV Screen 4th Generation wRfx Non Reactive Non Reactive    Comment: (NOTE) Performed At: [facility] [address] [place], [place] [id number] Nagendra Sanjai MD Ph:[phone]   Comprehensive metabolic panel     Status: Abnormal   Collection Time: 11/07/18  5:28 AM  Result Value Ref Range   Sodium 137 135 - 145 mmol/L   Potassium 4.4 3.5 - 5.1 mmol/L   Chloride 104 98 - 111 mmol/L   CO2 20 (L) 22 - 32 mmol/L   Glucose, Bld 91 70 - 99 mg/dL   BUN 8 6 - 20 mg/dL   Creatinine, Ser 0.68 0.61 - 1.24 mg/dL   Calcium 8.7 (L) 8.9 - 10.3 mg/dL   Total Protein 7.1 6.5 - 8.1 g/dL   Albumin 3.6 3.5 - 5.0 g/dL   AST 94 (H) 15 - 41 U/L   ALT 45 (H) 0 - 44 U/L   Alkaline Phosphatase 69 38 - 126 U/L   Total Bilirubin 0.7 0.3 - 1.2 mg/dL   GFR calc non Af Amer >60 >60 mL/min   GFR calc Af Amer >60 >60 mL/min    Comment: (NOTE) The eGFR has been calculated using the CKD EPI equation. This calculation has not been validated in all clinical situations. eGFR's  persistently <60 mL/min signify possible Chronic Kidney Disease.    Anion gap 13 5 - 15    Comment: Performed at [facility], [address] [place], [place] [address]  CBC     Status: Abnormal   Collection Time: 11/07/18  5:28 AM  Result Value Ref Range   WBC 6.6 4.0 - 10.5 K/uL   RBC 3.46 (L) 4.22 - 5.81 MIL/uL   Hemoglobin 10.8 (L) 13.0 - 17.0 g/dL   HCT 34.0 (L) 39.0 - 52.0 %   MCV 98.3 80.0 - 100.0 fL   MCH 31.2 26.0 - 34.0 pg   MCHC 31.8 30.0 - 36.0 g/dL   RDW 14.0 11.5 - 15.5 %   Platelets 207 150 - 400 K/uL   nRBC 0.0 0.0 - 0.2 %    Comment: Performed at [facility], [address] , [place] [address]      Ct Head Wo Contrast  Result Date: 11/07/2018 CLINICAL DATA:  Pain after fall. EXAM: CT HEAD WITHOUT CONTRAST CT MAXILLOFACIAL WITHOUT CONTRAST CT CERVICAL SPINE WITHOUT CONTRAST TECHNIQUE: Multidetector CT imaging of the head, cervical spine, and maxillofacial structures were performed using the standard protocol without intravenous contrast. Multiplanar CT image reconstructions of the cervical spine and maxillofacial structures were also generated. COMPARISON:  August 31, 2015 FINDINGS: CT HEAD FINDINGS Brain: There is blood along the left side of the falx consistent with subdural blood. No other subdural or epidural blood identified. There is a small amount of subarachnoid hemorrhage posteriorly on the left on axial image 17. I suspect a small underlying hemorrhagic contusion. No other extra-axial hemorrhage noted. The ventricles are unremarkable. No blood in the ventricles identified. The cerebellum and brainstem are unremarkable. Basal cisterns are patent. No midline shift. No acute cortical ischemia or infarct. Vascular: Calcified atherosclerosis in the intracranial carotids. Skull: Fractures in the right maxillary sinus will be described below. Other: There is soft tissue swelling over the right scalp and periorbital region. The underlying globe is  intact. There is air in the soft tissue surrounding the right maxillary sinus consistent with fracture. Extracranial soft tissues otherwise normal. CT MAXILLOFACIAL FINDINGS Osseous: There is a nondisplaced fracture through the anterior aspect of the right zygomatic arch as it joins the right maxillary sinus, as seen on axial image 48. There is also a nondisplaced fracture through the posterior aspect of the right zygomatic arch with fluid in a few adjacent anterior mastoid air cells. The mandible and left zygomatic arches are intact. Probable fracture at the base of the right nasal bone on axial image 47. There is a fracture through the body of the right pterygoid. This is best seen on sagittal image 34. Orbits: There is a fracture through the floor of the right orbit. There is also a fracture through the medial wall as seen on coronal image 28. There is a fracture through the posterolateral wall of the orbit as well as seen on coronal image 35. There is herniation of a small amount of fat through the right orbital floor fracture. The rectus muscle however is not herniated. The orbital floor fracture extends through the inferior orbital nerve foramen. The left orbit is intact. Sinuses: Multiple fractures are identified in the right maxillary sinus. There are fractures through the right orbital floor/right maxillary roof. This fracture is displaced. There is some herniated fat from the orbit but the inferior rectus muscle does not appear to be entrapped. There are fractures through the right maxillary inferior, anterior, posterior, and lateral walls as well. There is opacification of the right maxillary sinus. No other fractures associated with the sinuses. Soft tissues: There is soft tissue swelling over the right side of the mandible and right side of the maxilla. The soft tissue swelling extends superiorly over the cheek and into the periorbital region and forehead. There is air within the swollen soft tissues,  likely from the right maxillary sinus fractures which will be described above. Globes intact. CT CERVICAL SPINE FINDINGS Alignment: Normal. Skull base and vertebrae: No acute fracture. No primary bone lesion or focal pathologic process. Soft tissues and spinal canal: No prevertebral fluid or swelling. No visible canal hematoma. Disc levels:  Multilevel degenerative changes. Upper chest: Negative. Other: No other abnormalities. IMPRESSION: 1. There is a small amount of subdural blood along the left side of the falx. 2. There is a small amount of subarachnoid hemorrhage over the left posterior parietooccipital  region. There may be a small underlying hemorrhagic contusion. 3. There is a tripod fracture involving the floor of the right orbit, multiple walls of the right maxillary sinus, and these anterior and posterior aspects of the right zygomatic arch. 4. There is a fracture through the inferior wall of the right orbit with a herniation of a small amount of fat. The rectus muscles not herniated. 5. Fractures through the right maxillary sinus involve the posterior, anterior, superior, and lateral walls. 6. There is a fracture through the right pterygoid which is nondisplaced. 7. There is a fracture through the base the right nasal bones. 8. No fracture or traumatic malalignment in the cervical spine. Findings called to Dr. Horton. Electronically Signed   By: David  Williams III M.D   On: 11/07/2018 01:27   Ct Cervical Spine Wo Contrast  Result Date: 11/07/2018 CLINICAL DATA:  Pain after fall. EXAM: CT HEAD WITHOUT CONTRAST CT MAXILLOFACIAL WITHOUT CONTRAST CT CERVICAL SPINE WITHOUT CONTRAST TECHNIQUE: Multidetector CT imaging of the head, cervical spine, and maxillofacial structures were performed using the standard protocol without intravenous contrast. Multiplanar CT image reconstructions of the cervical spine and maxillofacial structures were also generated. COMPARISON:  August 31, 2015 FINDINGS: CT HEAD  FINDINGS Brain: There is blood along the left side of the falx consistent with subdural blood. No other subdural or epidural blood identified. There is a small amount of subarachnoid hemorrhage posteriorly on the left on axial image 17. I suspect a small underlying hemorrhagic contusion. No other extra-axial hemorrhage noted. The ventricles are unremarkable. No blood in the ventricles identified. The cerebellum and brainstem are unremarkable. Basal cisterns are patent. No midline shift. No acute cortical ischemia or infarct. Vascular: Calcified atherosclerosis in the intracranial carotids. Skull: Fractures in the right maxillary sinus will be described below. Other: There is soft tissue swelling over the right scalp and periorbital region. The underlying globe is intact. There is air in the soft tissue surrounding the right maxillary sinus consistent with fracture. Extracranial soft tissues otherwise normal. CT MAXILLOFACIAL FINDINGS Osseous: There is a nondisplaced fracture through the anterior aspect of the right zygomatic arch as it joins the right maxillary sinus, as seen on axial image 48. There is also a nondisplaced fracture through the posterior aspect of the right zygomatic arch with fluid in a few adjacent anterior mastoid air cells. The mandible and left zygomatic arches are intact. Probable fracture at the base of the right nasal bone on axial image 47. There is a fracture through the body of the right pterygoid. This is best seen on sagittal image 34. Orbits: There is a fracture through the floor of the right orbit. There is also a fracture through the medial wall as seen on coronal image 28. There is a fracture through the posterolateral wall of the orbit as well as seen on coronal image 35. There is herniation of a small amount of fat through the right orbital floor fracture. The rectus muscle however is not herniated. The orbital floor fracture extends through the inferior orbital nerve foramen. The  left orbit is intact. Sinuses: Multiple fractures are identified in the right maxillary sinus. There are fractures through the right orbital floor/right maxillary roof. This fracture is displaced. There is some herniated fat from the orbit but the inferior rectus muscle does not appear to be entrapped. There are fractures through the right maxillary inferior, anterior, posterior, and lateral walls as well. There is opacification of the right maxillary sinus. No other fractures associated with   the sinuses. Soft tissues: There is soft tissue swelling over the right side of the mandible and right side of the maxilla. The soft tissue swelling extends superiorly over the cheek and into the periorbital region and forehead. There is air within the swollen soft tissues, likely from the right maxillary sinus fractures which will be described above. Globes intact. CT CERVICAL SPINE FINDINGS Alignment: Normal. Skull base and vertebrae: No acute fracture. No primary bone lesion or focal pathologic process. Soft tissues and spinal canal: No prevertebral fluid or swelling. No visible canal hematoma. Disc levels:  Multilevel degenerative changes. Upper chest: Negative. Other: No other abnormalities. IMPRESSION: 1. There is a small amount of subdural blood along the left side of the falx. 2. There is a small amount of subarachnoid hemorrhage over the left posterior parietooccipital region. There may be a small underlying hemorrhagic contusion. 3. There is a tripod fracture involving the floor of the right orbit, multiple walls of the right maxillary sinus, and these anterior and posterior aspects of the right zygomatic arch. 4. There is a fracture through the inferior wall of the right orbit with a herniation of a small amount of fat. The rectus muscles not herniated. 5. Fractures through the right maxillary sinus involve the posterior, anterior, superior, and lateral walls. 6. There is a fracture through the right pterygoid which  is nondisplaced. 7. There is a fracture through the base the right nasal bones. 8. No fracture or traumatic malalignment in the cervical spine. Findings called to Dr. Dina Rich. Electronically Signed   By: Dorise Bullion III M.D   On: 11/07/2018 01:27   Ct Maxillofacial Wo Contrast  Result Date: 11/07/2018 CLINICAL DATA:  Pain after fall. EXAM: CT HEAD WITHOUT CONTRAST CT MAXILLOFACIAL WITHOUT CONTRAST CT CERVICAL SPINE WITHOUT CONTRAST TECHNIQUE: Multidetector CT imaging of the head, cervical spine, and maxillofacial structures were performed using the standard protocol without intravenous contrast. Multiplanar CT image reconstructions of the cervical spine and maxillofacial structures were also generated. COMPARISON:  August 31, 2015 FINDINGS: CT HEAD FINDINGS Brain: There is blood along the left side of the falx consistent with subdural blood. No other subdural or epidural blood identified. There is a small amount of subarachnoid hemorrhage posteriorly on the left on axial image 17. I suspect a small underlying hemorrhagic contusion. No other extra-axial hemorrhage noted. The ventricles are unremarkable. No blood in the ventricles identified. The cerebellum and brainstem are unremarkable. Basal cisterns are patent. No midline shift. No acute cortical ischemia or infarct. Vascular: Calcified atherosclerosis in the intracranial carotids. Skull: Fractures in the right maxillary sinus will be described below. Other: There is soft tissue swelling over the right scalp and periorbital region. The underlying globe is intact. There is air in the soft tissue surrounding the right maxillary sinus consistent with fracture. Extracranial soft tissues otherwise normal. CT MAXILLOFACIAL FINDINGS Osseous: There is a nondisplaced fracture through the anterior aspect of the right zygomatic arch as it joins the right maxillary sinus, as seen on axial image 48. There is also a nondisplaced fracture through the posterior aspect  of the right zygomatic arch with fluid in a few adjacent anterior mastoid air cells. The mandible and left zygomatic arches are intact. Probable fracture at the base of the right nasal bone on axial image 47. There is a fracture through the body of the right pterygoid. This is best seen on sagittal image 34. Orbits: There is a fracture through the floor of the right orbit. There is also a fracture  through the medial wall as seen on coronal image 28. There is a fracture through the posterolateral wall of the orbit as well as seen on coronal image 35. There is herniation of a small amount of fat through the right orbital floor fracture. The rectus muscle however is not herniated. The orbital floor fracture extends through the inferior orbital nerve foramen. The left orbit is intact. Sinuses: Multiple fractures are identified in the right maxillary sinus. There are fractures through the right orbital floor/right maxillary roof. This fracture is displaced. There is some herniated fat from the orbit but the inferior rectus muscle does not appear to be entrapped. There are fractures through the right maxillary inferior, anterior, posterior, and lateral walls as well. There is opacification of the right maxillary sinus. No other fractures associated with the sinuses. Soft tissues: There is soft tissue swelling over the right side of the mandible and right side of the maxilla. The soft tissue swelling extends superiorly over the cheek and into the periorbital region and forehead. There is air within the swollen soft tissues, likely from the right maxillary sinus fractures which will be described above. Globes intact. CT CERVICAL SPINE FINDINGS Alignment: Normal. Skull base and vertebrae: No acute fracture. No primary bone lesion or focal pathologic process. Soft tissues and spinal canal: No prevertebral fluid or swelling. No visible canal hematoma. Disc levels:  Multilevel degenerative changes. Upper chest: Negative. Other:  No other abnormalities. IMPRESSION: 1. There is a small amount of subdural blood along the left side of the falx. 2. There is a small amount of subarachnoid hemorrhage over the left posterior parietooccipital region. There may be a small underlying hemorrhagic contusion. 3. There is a tripod fracture involving the floor of the right orbit, multiple walls of the right maxillary sinus, and these anterior and posterior aspects of the right zygomatic arch. 4. There is a fracture through the inferior wall of the right orbit with a herniation of a small amount of fat. The rectus muscles not herniated. 5. Fractures through the right maxillary sinus involve the posterior, anterior, superior, and lateral walls. 6. There is a fracture through the right pterygoid which is nondisplaced. 7. There is a fracture through the base the right nasal bones. 8. No fracture or traumatic malalignment in the cervical spine. Findings called to Dr. Horton. Electronically Signed   By: David  Williams III M.D   On: 11/07/2018 01:27    Review of Systems  Constitutional: Negative.   HENT: Negative.   Eyes: Negative.   Respiratory: Negative.   Cardiovascular: Negative.   Gastrointestinal: Negative.   Genitourinary: Negative.   Musculoskeletal: Negative.   Skin: Negative.   Neurological: Negative.   Psychiatric/Behavioral: Negative.    Blood pressure (!) 152/77, pulse 79, temperature 98.6 F (37 C), resp. rate 15, SpO2 97 %. Physical Exam  Constitutional: He is oriented to person, place, and time. He appears well-developed and well-nourished.  HENT:  Head: Normocephalic and atraumatic.  Eyes: Pupils are equal, round, and reactive to light. EOM are normal.  Cardiovascular: Normal rate.  Respiratory: Effort normal.  GI: Soft.  Neurological: He is alert and oriented to person, place, and time.  Skin: Skin is warm. No erythema.  Psychiatric: He has a normal mood and affect. His behavior is normal. Judgment and thought  content normal.    Assessment/Plan: Will likely need open reduction internal fixation especially due to malocclusion.  Will continue to monitor.  Recommend liquid and completely soft diet.  No chewing.    Head of bed elevated as able.  Ice to face as able.  Recommend protein supplement, boost, at least daily and multivitamin.    Claire S Dillingham 11/07/2018, 2:03 PM     

## 2018-11-08 ENCOUNTER — Inpatient Hospital Stay (HOSPITAL_COMMUNITY): Payer: Medicaid Other

## 2018-11-08 LAB — COMPREHENSIVE METABOLIC PANEL
ALK PHOS: 70 U/L (ref 38–126)
ALT: 34 U/L (ref 0–44)
AST: 57 U/L — ABNORMAL HIGH (ref 15–41)
Albumin: 3.3 g/dL — ABNORMAL LOW (ref 3.5–5.0)
Anion gap: 8 (ref 5–15)
CALCIUM: 9 mg/dL (ref 8.9–10.3)
CO2: 24 mmol/L (ref 22–32)
CREATININE: 0.69 mg/dL (ref 0.61–1.24)
Chloride: 101 mmol/L (ref 98–111)
GFR calc non Af Amer: >60 mL/min (ref >60)
Glucose, Bld: 145 mg/dL — ABNORMAL HIGH (ref 70–99)
Potassium: 3.9 mmol/L (ref 3.5–5.1)
Sodium: 133 mmol/L — ABNORMAL LOW (ref 135–145)
Total Bilirubin: 0.7 mg/dL (ref 0.3–1.2)
Total Protein: 6.7 g/dL (ref 6.5–8.1)

## 2018-11-08 MED ORDER — DIPHENHYDRAMINE HCL 25 MG PO CAPS
50.0000 mg | ORAL_CAPSULE | Freq: Four times a day (QID) | ORAL | Status: DC | PRN
  Administered 2018-11-08: 50 mg via ORAL
  Filled 2018-11-08: qty 2

## 2018-11-08 MED ORDER — ACETAMINOPHEN 500 MG PO TABS
1000.0000 mg | ORAL_TABLET | Freq: Three times a day (TID) | ORAL | Status: DC
  Administered 2018-11-08 – 2018-11-09 (×4): 1000 mg via ORAL
  Filled 2018-11-08 (×4): qty 2

## 2018-11-08 MED ORDER — MORPHINE SULFATE (PF) 2 MG/ML IV SOLN
1.0000 mg | INTRAVENOUS | Status: DC | PRN
  Administered 2018-11-08 – 2018-11-09 (×3): 2 mg via INTRAVENOUS
  Filled 2018-11-08 (×3): qty 1

## 2018-11-08 NOTE — Evaluation (Signed)
Physical Therapy Evaluation Patient Details Name: Jaquon Gingerich MRN: [id number] DOB: Feb 27, 1961 Today's Date: 11/08/2018   History of Present Illness  Pt is a 57 year-old male s/p fall where he struck his face on the evening of 11/06/18. He sustained multiple facial fractures, a small left parietal SAH, and trace left falcotentorial SDH. PMH: bipolar 1 -d/o, schizophrenia, PTSD, anxiety. PSH: R THA 12/2015.  Clinical Impression  Pt admitted with above. Pt delayed in response time and demo's mild cognitive deficits however unsure of patients baseline level of cognition. Pt requires cane for safe ambulation at this time due to R LE pain. Pt unable to maintain L eye open due to not being able to open R eye due to swelling. Pt reports dizziness with mobility which could be vestibular related as pt fell hard on face however unable to accurately assess due to patients inability to maintain eyes open. Acute PT to cont to follow.    Follow Up Recommendations Home health PT;Supervision/Assistance - 24 hour(assess for BPPV once patient can maintain eyes open)    Equipment Recommendations  Cane    Recommendations for Other Services       Precautions / Restrictions Precautions Precautions: Fall Precaution Comments: facial fractures, R eye swollen shut Restrictions Weight Bearing Restrictions: No      Mobility  Bed Mobility               General bed mobility comments: pt up in chair upon PT arrival  Transfers Overall transfer level: Needs assistance Equipment used: None Transfers: Sit to/from Stand Sit to Stand: Min guard         General transfer comment: increased time  Ambulation/Gait Ambulation/Gait assistance: Min guard Gait Distance (Feet): 200 Feet Assistive device: IV Pole Gait Pattern/deviations: Step-through pattern;Antalgic Gait velocity: decreased Gait velocity interpretation: 1.31 - 2.62 ft/sec, indicative of limited community ambulator General Gait Details: pt  with R antalgic limp requiring use of IV pole for increased stability, pt to benefit from cane. Pt also held L hand above L eye stating "it feels better when I do this". Pt denies being sensitive to the light or room spinning but states he is dizzy upon initial standing  Stairs            Wheelchair Mobility    Modified Rankin (Stroke Patients Only)       Balance Overall balance assessment: Mild deficits observed, not formally tested                                           Pertinent Vitals/Pain Pain Assessment: 0-10 Pain Score: 8  Pain Location: face Pain Descriptors / Indicators: Sore Pain Intervention(s): Monitored during session    Home Living Family/patient expects to be discharged to:: Private residence Living Arrangements: Other (Comment)(states he lives in a boarding house) Available Help at Discharge: Personal care attendant;Available 24 hours/day(friends at boarding house) Type of Home: Other(Comment)(boarding home) Home Access: Stairs to enter Entrance Stairs-Rails: Left Entrance Stairs-Number of Steps: 2 Home Layout: One level Home Equipment: None Additional Comments: pt reports a friend at the home can cook his meals    Prior Function Level of Independence: Independent         Comments: reported using a cane but now he doesnt have it anymore     Hand Dominance   Dominant Hand: Right    Extremity/Trunk Assessment   Upper Extremity  Assessment Upper Extremity Assessment: Overall WFL for tasks assessed    Lower Extremity Assessment Lower Extremity Assessment: Overall WFL for tasks assessed    Cervical / Trunk Assessment Cervical / Trunk Assessment: Normal  Communication   Communication: No difficulties  Cognition Arousal/Alertness: Awake/alert Behavior During Therapy: Flat affect Overall Cognitive Status: No family/caregiver present to determine baseline cognitive functioning                                  General Comments: pt answered appropriately but delayed in response. noted psych history and unsure of baseline level of cognition      General Comments General comments (skin integrity, edema, etc.): pt with R swollen eye and lacerations    Exercises     Assessment/Plan    PT Assessment Patient needs continued PT services  PT Problem List Decreased strength;Decreased range of motion;Decreased activity tolerance;Decreased balance;Decreased mobility;Decreased coordination;Decreased cognition;Decreased knowledge of use of DME;Decreased safety awareness;Decreased knowledge of precautions;Pain       PT Treatment Interventions DME instruction;Gait training;Functional mobility training;Stair training;Therapeutic activities;Therapeutic exercise;Balance training;Neuromuscular re-education    PT Goals (Current goals can be found in the Care Plan section)  Acute Rehab PT Goals Patient Stated Goal: home PT Goal Formulation: With patient Time For Goal Achievement: 11/22/18 Potential to Achieve Goals: Good    Frequency Min 3X/week   Barriers to discharge Decreased caregiver support unsure of available assist    Co-evaluation               AM-PAC PT "6 Clicks" Daily Activity  Outcome Measure Difficulty turning over in bed (including adjusting bedclothes, sheets and blankets)?: None Difficulty moving from lying on back to sitting on the side of the bed? : None Difficulty sitting down on and standing up from a chair with arms (e.g., wheelchair, bedside commode, etc,.)?: A Little Help needed moving to and from a bed to chair (including a wheelchair)?: A Little Help needed walking in hospital room?: A Little Help needed climbing 3-5 steps with a railing? : A Little 6 Click Score: 20    End of Session Equipment Utilized During Treatment: Gait belt Activity Tolerance: Patient tolerated treatment well Patient left: in chair;with call bell/phone within reach;with nursing/sitter in  room Nurse Communication: Mobility status PT Visit Diagnosis: Unsteadiness on feet (R26.81)    Time: 1610-9604 PT Time Calculation (min) (ACUTE ONLY): 16 min   Charges:   PT Evaluation $PT Eval Moderate Complexity: 1 Mod          Lewis Shock, PT, DPT Acute Rehabilitation Services Pager #: [phone] Office #: [phone]   Iona Hansen 11/08/2018, 1:44 PM

## 2018-11-08 NOTE — Progress Notes (Signed)
Trauma Service Note  Subjective: Patient is awake, alert and oriented.  No acute distress.  Very nice man  Objective: Vital signs in last 24 hours: Temp:  [98.2 F (36.8 C)-98.9 F (37.2 C)] 98.6 F (37 C) (11/13 0400) Pulse Rate:  [64-88] 64 (11/13 0700) Resp:  [13-21] 13 (11/13 0700) BP: (133-172)/(63-89) 151/73 (11/13 0700) SpO2:  [95 %-100 %] 99 % (11/13 0700)    Intake/Output from previous day: 11/12 0701 - 11/13 0700 In: 2422.3 [P.O.:240; I.V.:2182.3] Out: 1425 [Urine:1425] Intake/Output this shift: No intake/output data recorded.  General: No distress.  Lungs: Clear to auscultation.  Abd: Soft, benign  Extremities: No concerns  Neuro: Intact  Lab Results: CBC  Recent Labs    11/06/18 2336 11/06/18 2343 11/07/18 0528  WBC 5.5  --  6.6  HGB 10.9* 13.3 10.8*  HCT 35.6* 39.0 34.0*  PLT 189  --  207   BMET Recent Labs    11/07/18 0528 11/08/18 0255  NA 137 133*  K 4.4 3.9  CL 104 101  CO2 20* 24  GLUCOSE 91 145*  BUN 8 <5*  CREATININE 0.68 0.69  CALCIUM 8.7* 9.0   PT/INR Recent Labs    11/06/18 2336  LABPROT 12.3  INR 0.92   ABG No results for input(s): PHART, HCO3 in the last 72 hours.  Invalid input(s): PCO2, PO2  Studies/Results: Ct Head Wo Contrast  Result Date: 11/08/2018 CLINICAL DATA:  Follow up intracranial hemorrhage. EXAM: CT HEAD WITHOUT CONTRAST TECHNIQUE: Contiguous axial images were obtained from the base of the skull through the vertex without intravenous contrast. COMPARISON:  CT HEAD November 06, 2018 FINDINGS: BRAIN: LEFT parieto-occipital 18 x 4 mm intraparenchymal hematoma with surrounding low-density vasogenic edema. Trace LEFT cerebellar tentorium and LEFT parafalcine subdural hematoma. Small volume LEFT parietal subarachnoid hemorrhage. Mild parenchymal brain volume loss. No hydrocephalus. No acute large vascular territory infarct. VASCULAR: Unremarkable. SKULL/SOFT TISSUES: No skull fracture. Facial fractures. Small  large RIGHT frontal, periorbital and RIGHT facial hematomas. ORBITS/SINUSES: RIGHT facial fractures with RIGHT maxillary hemosinus. Ocular globes intact, lenses are located. Identified postseptal hematoma. OTHER: None. IMPRESSION: 1. Small RIGHT occipital hemorrhagic contusion. Small volume LEFT parietal subarachnoid hemorrhage and trace LEFT falcotentorial subdural hematoma. 2. Mild parenchymal brain volume loss. Electronically Signed   By: Awilda Metro M.D.   On: 11/08/2018 05:00   Ct Head Wo Contrast  Result Date: 11/07/2018 CLINICAL DATA:  Pain after fall. EXAM: CT HEAD WITHOUT CONTRAST CT MAXILLOFACIAL WITHOUT CONTRAST CT CERVICAL SPINE WITHOUT CONTRAST TECHNIQUE: Multidetector CT imaging of the head, cervical spine, and maxillofacial structures were performed using the standard protocol without intravenous contrast. Multiplanar CT image reconstructions of the cervical spine and maxillofacial structures were also generated. COMPARISON:  August 31, 2015 FINDINGS: CT HEAD FINDINGS Brain: There is blood along the left side of the falx consistent with subdural blood. No other subdural or epidural blood identified. There is a small amount of subarachnoid hemorrhage posteriorly on the left on axial image 17. I suspect a small underlying hemorrhagic contusion. No other extra-axial hemorrhage noted. The ventricles are unremarkable. No blood in the ventricles identified. The cerebellum and brainstem are unremarkable. Basal cisterns are patent. No midline shift. No acute cortical ischemia or infarct. Vascular: Calcified atherosclerosis in the intracranial carotids. Skull: Fractures in the right maxillary sinus will be described below. Other: There is soft tissue swelling over the right scalp and periorbital region. The underlying globe is intact. There is air in the soft tissue surrounding the right  maxillary sinus consistent with fracture. Extracranial soft tissues otherwise normal. CT MAXILLOFACIAL FINDINGS  Osseous: There is a nondisplaced fracture through the anterior aspect of the right zygomatic arch as it joins the right maxillary sinus, as seen on axial image 48. There is also a nondisplaced fracture through the posterior aspect of the right zygomatic arch with fluid in a few adjacent anterior mastoid air cells. The mandible and left zygomatic arches are intact. Probable fracture at the base of the right nasal bone on axial image 47. There is a fracture through the body of the right pterygoid. This is best seen on sagittal image 34. Orbits: There is a fracture through the floor of the right orbit. There is also a fracture through the medial wall as seen on coronal image 28. There is a fracture through the posterolateral wall of the orbit as well as seen on coronal image 35. There is herniation of a small amount of fat through the right orbital floor fracture. The rectus muscle however is not herniated. The orbital floor fracture extends through the inferior orbital nerve foramen. The left orbit is intact. Sinuses: Multiple fractures are identified in the right maxillary sinus. There are fractures through the right orbital floor/right maxillary roof. This fracture is displaced. There is some herniated fat from the orbit but the inferior rectus muscle does not appear to be entrapped. There are fractures through the right maxillary inferior, anterior, posterior, and lateral walls as well. There is opacification of the right maxillary sinus. No other fractures associated with the sinuses. Soft tissues: There is soft tissue swelling over the right side of the mandible and right side of the maxilla. The soft tissue swelling extends superiorly over the cheek and into the periorbital region and forehead. There is air within the swollen soft tissues, likely from the right maxillary sinus fractures which will be described above. Globes intact. CT CERVICAL SPINE FINDINGS Alignment: Normal. Skull base and vertebrae: No acute  fracture. No primary bone lesion or focal pathologic process. Soft tissues and spinal canal: No prevertebral fluid or swelling. No visible canal hematoma. Disc levels:  Multilevel degenerative changes. Upper chest: Negative. Other: No other abnormalities. IMPRESSION: 1. There is a small amount of subdural blood along the left side of the falx. 2. There is a small amount of subarachnoid hemorrhage over the left posterior parietooccipital region. There may be a small underlying hemorrhagic contusion. 3. There is a tripod fracture involving the floor of the right orbit, multiple walls of the right maxillary sinus, and these anterior and posterior aspects of the right zygomatic arch. 4. There is a fracture through the inferior wall of the right orbit with a herniation of a small amount of fat. The rectus muscles not herniated. 5. Fractures through the right maxillary sinus involve the posterior, anterior, superior, and lateral walls. 6. There is a fracture through the right pterygoid which is nondisplaced. 7. There is a fracture through the base the right nasal bones. 8. No fracture or traumatic malalignment in the cervical spine. Findings called to Dr. Wilkie Aye. Electronically Signed   By: Gerome Sam III M.D   On: 11/07/2018 01:27   Ct Cervical Spine Wo Contrast  Result Date: 11/07/2018 CLINICAL DATA:  Pain after fall. EXAM: CT HEAD WITHOUT CONTRAST CT MAXILLOFACIAL WITHOUT CONTRAST CT CERVICAL SPINE WITHOUT CONTRAST TECHNIQUE: Multidetector CT imaging of the head, cervical spine, and maxillofacial structures were performed using the standard protocol without intravenous contrast. Multiplanar CT image reconstructions of the cervical spine and  maxillofacial structures were also generated. COMPARISON:  August 31, 2015 FINDINGS: CT HEAD FINDINGS Brain: There is blood along the left side of the falx consistent with subdural blood. No other subdural or epidural blood identified. There is a small amount of  subarachnoid hemorrhage posteriorly on the left on axial image 17. I suspect a small underlying hemorrhagic contusion. No other extra-axial hemorrhage noted. The ventricles are unremarkable. No blood in the ventricles identified. The cerebellum and brainstem are unremarkable. Basal cisterns are patent. No midline shift. No acute cortical ischemia or infarct. Vascular: Calcified atherosclerosis in the intracranial carotids. Skull: Fractures in the right maxillary sinus will be described below. Other: There is soft tissue swelling over the right scalp and periorbital region. The underlying globe is intact. There is air in the soft tissue surrounding the right maxillary sinus consistent with fracture. Extracranial soft tissues otherwise normal. CT MAXILLOFACIAL FINDINGS Osseous: There is a nondisplaced fracture through the anterior aspect of the right zygomatic arch as it joins the right maxillary sinus, as seen on axial image 48. There is also a nondisplaced fracture through the posterior aspect of the right zygomatic arch with fluid in a few adjacent anterior mastoid air cells. The mandible and left zygomatic arches are intact. Probable fracture at the base of the right nasal bone on axial image 47. There is a fracture through the body of the right pterygoid. This is best seen on sagittal image 34. Orbits: There is a fracture through the floor of the right orbit. There is also a fracture through the medial wall as seen on coronal image 28. There is a fracture through the posterolateral wall of the orbit as well as seen on coronal image 35. There is herniation of a small amount of fat through the right orbital floor fracture. The rectus muscle however is not herniated. The orbital floor fracture extends through the inferior orbital nerve foramen. The left orbit is intact. Sinuses: Multiple fractures are identified in the right maxillary sinus. There are fractures through the right orbital floor/right maxillary roof.  This fracture is displaced. There is some herniated fat from the orbit but the inferior rectus muscle does not appear to be entrapped. There are fractures through the right maxillary inferior, anterior, posterior, and lateral walls as well. There is opacification of the right maxillary sinus. No other fractures associated with the sinuses. Soft tissues: There is soft tissue swelling over the right side of the mandible and right side of the maxilla. The soft tissue swelling extends superiorly over the cheek and into the periorbital region and forehead. There is air within the swollen soft tissues, likely from the right maxillary sinus fractures which will be described above. Globes intact. CT CERVICAL SPINE FINDINGS Alignment: Normal. Skull base and vertebrae: No acute fracture. No primary bone lesion or focal pathologic process. Soft tissues and spinal canal: No prevertebral fluid or swelling. No visible canal hematoma. Disc levels:  Multilevel degenerative changes. Upper chest: Negative. Other: No other abnormalities. IMPRESSION: 1. There is a small amount of subdural blood along the left side of the falx. 2. There is a small amount of subarachnoid hemorrhage over the left posterior parietooccipital region. There may be a small underlying hemorrhagic contusion. 3. There is a tripod fracture involving the floor of the right orbit, multiple walls of the right maxillary sinus, and these anterior and posterior aspects of the right zygomatic arch. 4. There is a fracture through the inferior wall of the right orbit with a herniation of a  small amount of fat. The rectus muscles not herniated. 5. Fractures through the right maxillary sinus involve the posterior, anterior, superior, and lateral walls. 6. There is a fracture through the right pterygoid which is nondisplaced. 7. There is a fracture through the base the right nasal bones. 8. No fracture or traumatic malalignment in the cervical spine. Findings called to Dr.  Wilkie Aye. Electronically Signed   By: Gerome Sam III M.D   On: 11/07/2018 01:27   Ct Maxillofacial Wo Contrast  Result Date: 11/07/2018 CLINICAL DATA:  Pain after fall. EXAM: CT HEAD WITHOUT CONTRAST CT MAXILLOFACIAL WITHOUT CONTRAST CT CERVICAL SPINE WITHOUT CONTRAST TECHNIQUE: Multidetector CT imaging of the head, cervical spine, and maxillofacial structures were performed using the standard protocol without intravenous contrast. Multiplanar CT image reconstructions of the cervical spine and maxillofacial structures were also generated. COMPARISON:  August 31, 2015 FINDINGS: CT HEAD FINDINGS Brain: There is blood along the left side of the falx consistent with subdural blood. No other subdural or epidural blood identified. There is a small amount of subarachnoid hemorrhage posteriorly on the left on axial image 17. I suspect a small underlying hemorrhagic contusion. No other extra-axial hemorrhage noted. The ventricles are unremarkable. No blood in the ventricles identified. The cerebellum and brainstem are unremarkable. Basal cisterns are patent. No midline shift. No acute cortical ischemia or infarct. Vascular: Calcified atherosclerosis in the intracranial carotids. Skull: Fractures in the right maxillary sinus will be described below. Other: There is soft tissue swelling over the right scalp and periorbital region. The underlying globe is intact. There is air in the soft tissue surrounding the right maxillary sinus consistent with fracture. Extracranial soft tissues otherwise normal. CT MAXILLOFACIAL FINDINGS Osseous: There is a nondisplaced fracture through the anterior aspect of the right zygomatic arch as it joins the right maxillary sinus, as seen on axial image 48. There is also a nondisplaced fracture through the posterior aspect of the right zygomatic arch with fluid in a few adjacent anterior mastoid air cells. The mandible and left zygomatic arches are intact. Probable fracture at the base of  the right nasal bone on axial image 47. There is a fracture through the body of the right pterygoid. This is best seen on sagittal image 34. Orbits: There is a fracture through the floor of the right orbit. There is also a fracture through the medial wall as seen on coronal image 28. There is a fracture through the posterolateral wall of the orbit as well as seen on coronal image 35. There is herniation of a small amount of fat through the right orbital floor fracture. The rectus muscle however is not herniated. The orbital floor fracture extends through the inferior orbital nerve foramen. The left orbit is intact. Sinuses: Multiple fractures are identified in the right maxillary sinus. There are fractures through the right orbital floor/right maxillary roof. This fracture is displaced. There is some herniated fat from the orbit but the inferior rectus muscle does not appear to be entrapped. There are fractures through the right maxillary inferior, anterior, posterior, and lateral walls as well. There is opacification of the right maxillary sinus. No other fractures associated with the sinuses. Soft tissues: There is soft tissue swelling over the right side of the mandible and right side of the maxilla. The soft tissue swelling extends superiorly over the cheek and into the periorbital region and forehead. There is air within the swollen soft tissues, likely from the right maxillary sinus fractures which will be described above. Globes  intact. CT CERVICAL SPINE FINDINGS Alignment: Normal. Skull base and vertebrae: No acute fracture. No primary bone lesion or focal pathologic process. Soft tissues and spinal canal: No prevertebral fluid or swelling. No visible canal hematoma. Disc levels:  Multilevel degenerative changes. Upper chest: Negative. Other: No other abnormalities. IMPRESSION: 1. There is a small amount of subdural blood along the left side of the falx. 2. There is a small amount of subarachnoid hemorrhage  over the left posterior parietooccipital region. There may be a small underlying hemorrhagic contusion. 3. There is a tripod fracture involving the floor of the right orbit, multiple walls of the right maxillary sinus, and these anterior and posterior aspects of the right zygomatic arch. 4. There is a fracture through the inferior wall of the right orbit with a herniation of a small amount of fat. The rectus muscles not herniated. 5. Fractures through the right maxillary sinus involve the posterior, anterior, superior, and lateral walls. 6. There is a fracture through the right pterygoid which is nondisplaced. 7. There is a fracture through the base the right nasal bones. 8. No fracture or traumatic malalignment in the cervical spine. Findings called to Dr. Wilkie Aye. Electronically Signed   By: Gerome Sam III M.D   On: 11/07/2018 01:27    Anti-infectives: Anti-infectives (From admission, onward)   None      Assessment/Plan: s/p  Advance diet Full liquids only.  Transfer to floor.  LOS: 1 day   Marta Lamas. Gae Bon, MD, FACS [phone] Trauma Surgeon 11/08/2018

## 2018-11-08 NOTE — Progress Notes (Signed)
Subjective: The patient is alert and pleasant.  He has no complaints.  Objective: Vital signs in last 24 hours: Temp:  [98.2 F (36.8 C)-98.9 F (37.2 C)] 98.4 F (36.9 C) (11/13 1219) Pulse Rate:  [64-84] 76 (11/13 1219) Resp:  [9-21] 20 (11/13 1219) BP: (133-172)/(64-89) 140/80 (11/13 1219) SpO2:  [98 %-100 %] 98 % (11/13 1219) Estimated body mass index is 23.06 kg/m as calculated from the following:   Height as of 03/14/18: 6' (1.829 m).   Weight as of 03/14/18: 77.1 kg.   Intake/Output from previous day: 11/12 0701 - 11/13 0700 In: 2422.3 [P.O.:240; I.V.:2182.3] Out: 1425 [Urine:1425] Intake/Output this shift: Total I/O In: 513.4 [P.O.:360; I.V.:153.4] Out: -   Physical exam patient is alert and oriented x3.  His speech  is normal.  He is moving all 4 extremities well.  I reviewed the patient's follow-up head CT performed today.  He has a small left occipital contusion without significant mass-effect.  Lab Results: Recent Labs    11/06/18 2336 11/06/18 2343 11/07/18 0528  WBC 5.5  --  6.6  HGB 10.9* 13.3 10.8*  HCT 35.6* 39.0 34.0*  PLT 189  --  207   BMET Recent Labs    11/07/18 0528 11/08/18 0255  NA 137 133*  K 4.4 3.9  CL 104 101  CO2 20* 24  GLUCOSE 91 145*  BUN 8 <5*  CREATININE 0.68 0.69  CALCIUM 8.7* 9.0    Studies/Results: Ct Head Wo Contrast  Result Date: 11/08/2018 CLINICAL DATA:  Follow up intracranial hemorrhage. EXAM: CT HEAD WITHOUT CONTRAST TECHNIQUE: Contiguous axial images were obtained from the base of the skull through the vertex without intravenous contrast. COMPARISON:  CT HEAD November 06, 2018 FINDINGS: BRAIN: LEFT parieto-occipital 18 x 4 mm intraparenchymal hematoma with surrounding low-density vasogenic edema. Trace LEFT cerebellar tentorium and LEFT parafalcine subdural hematoma. Small volume LEFT parietal subarachnoid hemorrhage. Mild parenchymal brain volume loss. No hydrocephalus. No acute large vascular territory infarct.  VASCULAR: Unremarkable. SKULL/SOFT TISSUES: No skull fracture. Facial fractures. Small large RIGHT frontal, periorbital and RIGHT facial hematomas. ORBITS/SINUSES: RIGHT facial fractures with RIGHT maxillary hemosinus. Ocular globes intact, lenses are located. Identified postseptal hematoma. OTHER: None. IMPRESSION: 1. Small RIGHT occipital hemorrhagic contusion. Small volume LEFT parietal subarachnoid hemorrhage and trace LEFT falcotentorial subdural hematoma. 2. Mild parenchymal brain volume loss. Electronically Signed   By: Awilda Metro M.D.   On: 11/08/2018 05:00   Ct Head Wo Contrast  Result Date: 11/07/2018 CLINICAL DATA:  Pain after fall. EXAM: CT HEAD WITHOUT CONTRAST CT MAXILLOFACIAL WITHOUT CONTRAST CT CERVICAL SPINE WITHOUT CONTRAST TECHNIQUE: Multidetector CT imaging of the head, cervical spine, and maxillofacial structures were performed using the standard protocol without intravenous contrast. Multiplanar CT image reconstructions of the cervical spine and maxillofacial structures were also generated. COMPARISON:  August 31, 2015 FINDINGS: CT HEAD FINDINGS Brain: There is blood along the left side of the falx consistent with subdural blood. No other subdural or epidural blood identified. There is a small amount of subarachnoid hemorrhage posteriorly on the left on axial image 17. I suspect a small underlying hemorrhagic contusion. No other extra-axial hemorrhage noted. The ventricles are unremarkable. No blood in the ventricles identified. The cerebellum and brainstem are unremarkable. Basal cisterns are patent. No midline shift. No acute cortical ischemia or infarct. Vascular: Calcified atherosclerosis in the intracranial carotids. Skull: Fractures in the right maxillary sinus will be described below. Other: There is soft tissue swelling over the right scalp and periorbital region.  The underlying globe is intact. There is air in the soft tissue surrounding the right maxillary sinus  consistent with fracture. Extracranial soft tissues otherwise normal. CT MAXILLOFACIAL FINDINGS Osseous: There is a nondisplaced fracture through the anterior aspect of the right zygomatic arch as it joins the right maxillary sinus, as seen on axial image 48. There is also a nondisplaced fracture through the posterior aspect of the right zygomatic arch with fluid in a few adjacent anterior mastoid air cells. The mandible and left zygomatic arches are intact. Probable fracture at the base of the right nasal bone on axial image 47. There is a fracture through the body of the right pterygoid. This is best seen on sagittal image 34. Orbits: There is a fracture through the floor of the right orbit. There is also a fracture through the medial wall as seen on coronal image 28. There is a fracture through the posterolateral wall of the orbit as well as seen on coronal image 35. There is herniation of a small amount of fat through the right orbital floor fracture. The rectus muscle however is not herniated. The orbital floor fracture extends through the inferior orbital nerve foramen. The left orbit is intact. Sinuses: Multiple fractures are identified in the right maxillary sinus. There are fractures through the right orbital floor/right maxillary roof. This fracture is displaced. There is some herniated fat from the orbit but the inferior rectus muscle does not appear to be entrapped. There are fractures through the right maxillary inferior, anterior, posterior, and lateral walls as well. There is opacification of the right maxillary sinus. No other fractures associated with the sinuses. Soft tissues: There is soft tissue swelling over the right side of the mandible and right side of the maxilla. The soft tissue swelling extends superiorly over the cheek and into the periorbital region and forehead. There is air within the swollen soft tissues, likely from the right maxillary sinus fractures which will be described above.  Globes intact. CT CERVICAL SPINE FINDINGS Alignment: Normal. Skull base and vertebrae: No acute fracture. No primary bone lesion or focal pathologic process. Soft tissues and spinal canal: No prevertebral fluid or swelling. No visible canal hematoma. Disc levels:  Multilevel degenerative changes. Upper chest: Negative. Other: No other abnormalities. IMPRESSION: 1. There is a small amount of subdural blood along the left side of the falx. 2. There is a small amount of subarachnoid hemorrhage over the left posterior parietooccipital region. There may be a small underlying hemorrhagic contusion. 3. There is a tripod fracture involving the floor of the right orbit, multiple walls of the right maxillary sinus, and these anterior and posterior aspects of the right zygomatic arch. 4. There is a fracture through the inferior wall of the right orbit with a herniation of a small amount of fat. The rectus muscles not herniated. 5. Fractures through the right maxillary sinus involve the posterior, anterior, superior, and lateral walls. 6. There is a fracture through the right pterygoid which is nondisplaced. 7. There is a fracture through the base the right nasal bones. 8. No fracture or traumatic malalignment in the cervical spine. Findings called to Dr. Wilkie Aye. Electronically Signed   By: Gerome Sam III M.D   On: 11/07/2018 01:27   Ct Cervical Spine Wo Contrast  Result Date: 11/07/2018 CLINICAL DATA:  Pain after fall. EXAM: CT HEAD WITHOUT CONTRAST CT MAXILLOFACIAL WITHOUT CONTRAST CT CERVICAL SPINE WITHOUT CONTRAST TECHNIQUE: Multidetector CT imaging of the head, cervical spine, and maxillofacial structures were performed using  the standard protocol without intravenous contrast. Multiplanar CT image reconstructions of the cervical spine and maxillofacial structures were also generated. COMPARISON:  August 31, 2015 FINDINGS: CT HEAD FINDINGS Brain: There is blood along the left side of the falx consistent with  subdural blood. No other subdural or epidural blood identified. There is a small amount of subarachnoid hemorrhage posteriorly on the left on axial image 17. I suspect a small underlying hemorrhagic contusion. No other extra-axial hemorrhage noted. The ventricles are unremarkable. No blood in the ventricles identified. The cerebellum and brainstem are unremarkable. Basal cisterns are patent. No midline shift. No acute cortical ischemia or infarct. Vascular: Calcified atherosclerosis in the intracranial carotids. Skull: Fractures in the right maxillary sinus will be described below. Other: There is soft tissue swelling over the right scalp and periorbital region. The underlying globe is intact. There is air in the soft tissue surrounding the right maxillary sinus consistent with fracture. Extracranial soft tissues otherwise normal. CT MAXILLOFACIAL FINDINGS Osseous: There is a nondisplaced fracture through the anterior aspect of the right zygomatic arch as it joins the right maxillary sinus, as seen on axial image 48. There is also a nondisplaced fracture through the posterior aspect of the right zygomatic arch with fluid in a few adjacent anterior mastoid air cells. The mandible and left zygomatic arches are intact. Probable fracture at the base of the right nasal bone on axial image 47. There is a fracture through the body of the right pterygoid. This is best seen on sagittal image 34. Orbits: There is a fracture through the floor of the right orbit. There is also a fracture through the medial wall as seen on coronal image 28. There is a fracture through the posterolateral wall of the orbit as well as seen on coronal image 35. There is herniation of a small amount of fat through the right orbital floor fracture. The rectus muscle however is not herniated. The orbital floor fracture extends through the inferior orbital nerve foramen. The left orbit is intact. Sinuses: Multiple fractures are identified in the right  maxillary sinus. There are fractures through the right orbital floor/right maxillary roof. This fracture is displaced. There is some herniated fat from the orbit but the inferior rectus muscle does not appear to be entrapped. There are fractures through the right maxillary inferior, anterior, posterior, and lateral walls as well. There is opacification of the right maxillary sinus. No other fractures associated with the sinuses. Soft tissues: There is soft tissue swelling over the right side of the mandible and right side of the maxilla. The soft tissue swelling extends superiorly over the cheek and into the periorbital region and forehead. There is air within the swollen soft tissues, likely from the right maxillary sinus fractures which will be described above. Globes intact. CT CERVICAL SPINE FINDINGS Alignment: Normal. Skull base and vertebrae: No acute fracture. No primary bone lesion or focal pathologic process. Soft tissues and spinal canal: No prevertebral fluid or swelling. No visible canal hematoma. Disc levels:  Multilevel degenerative changes. Upper chest: Negative. Other: No other abnormalities. IMPRESSION: 1. There is a small amount of subdural blood along the left side of the falx. 2. There is a small amount of subarachnoid hemorrhage over the left posterior parietooccipital region. There may be a small underlying hemorrhagic contusion. 3. There is a tripod fracture involving the floor of the right orbit, multiple walls of the right maxillary sinus, and these anterior and posterior aspects of the right zygomatic arch. 4. There is  a fracture through the inferior wall of the right orbit with a herniation of a small amount of fat. The rectus muscles not herniated. 5. Fractures through the right maxillary sinus involve the posterior, anterior, superior, and lateral walls. 6. There is a fracture through the right pterygoid which is nondisplaced. 7. There is a fracture through the base the right nasal  bones. 8. No fracture or traumatic malalignment in the cervical spine. Findings called to Dr. Wilkie Aye. Electronically Signed   By: Gerome Sam III M.D   On: 11/07/2018 01:27   Ct Maxillofacial Wo Contrast  Result Date: 11/07/2018 CLINICAL DATA:  Pain after fall. EXAM: CT HEAD WITHOUT CONTRAST CT MAXILLOFACIAL WITHOUT CONTRAST CT CERVICAL SPINE WITHOUT CONTRAST TECHNIQUE: Multidetector CT imaging of the head, cervical spine, and maxillofacial structures were performed using the standard protocol without intravenous contrast. Multiplanar CT image reconstructions of the cervical spine and maxillofacial structures were also generated. COMPARISON:  August 31, 2015 FINDINGS: CT HEAD FINDINGS Brain: There is blood along the left side of the falx consistent with subdural blood. No other subdural or epidural blood identified. There is a small amount of subarachnoid hemorrhage posteriorly on the left on axial image 17. I suspect a small underlying hemorrhagic contusion. No other extra-axial hemorrhage noted. The ventricles are unremarkable. No blood in the ventricles identified. The cerebellum and brainstem are unremarkable. Basal cisterns are patent. No midline shift. No acute cortical ischemia or infarct. Vascular: Calcified atherosclerosis in the intracranial carotids. Skull: Fractures in the right maxillary sinus will be described below. Other: There is soft tissue swelling over the right scalp and periorbital region. The underlying globe is intact. There is air in the soft tissue surrounding the right maxillary sinus consistent with fracture. Extracranial soft tissues otherwise normal. CT MAXILLOFACIAL FINDINGS Osseous: There is a nondisplaced fracture through the anterior aspect of the right zygomatic arch as it joins the right maxillary sinus, as seen on axial image 48. There is also a nondisplaced fracture through the posterior aspect of the right zygomatic arch with fluid in a few adjacent anterior mastoid  air cells. The mandible and left zygomatic arches are intact. Probable fracture at the base of the right nasal bone on axial image 47. There is a fracture through the body of the right pterygoid. This is best seen on sagittal image 34. Orbits: There is a fracture through the floor of the right orbit. There is also a fracture through the medial wall as seen on coronal image 28. There is a fracture through the posterolateral wall of the orbit as well as seen on coronal image 35. There is herniation of a small amount of fat through the right orbital floor fracture. The rectus muscle however is not herniated. The orbital floor fracture extends through the inferior orbital nerve foramen. The left orbit is intact. Sinuses: Multiple fractures are identified in the right maxillary sinus. There are fractures through the right orbital floor/right maxillary roof. This fracture is displaced. There is some herniated fat from the orbit but the inferior rectus muscle does not appear to be entrapped. There are fractures through the right maxillary inferior, anterior, posterior, and lateral walls as well. There is opacification of the right maxillary sinus. No other fractures associated with the sinuses. Soft tissues: There is soft tissue swelling over the right side of the mandible and right side of the maxilla. The soft tissue swelling extends superiorly over the cheek and into the periorbital region and forehead. There is air within the swollen  soft tissues, likely from the right maxillary sinus fractures which will be described above. Globes intact. CT CERVICAL SPINE FINDINGS Alignment: Normal. Skull base and vertebrae: No acute fracture. No primary bone lesion or focal pathologic process. Soft tissues and spinal canal: No prevertebral fluid or swelling. No visible canal hematoma. Disc levels:  Multilevel degenerative changes. Upper chest: Negative. Other: No other abnormalities. IMPRESSION: 1. There is a small amount of  subdural blood along the left side of the falx. 2. There is a small amount of subarachnoid hemorrhage over the left posterior parietooccipital region. There may be a small underlying hemorrhagic contusion. 3. There is a tripod fracture involving the floor of the right orbit, multiple walls of the right maxillary sinus, and these anterior and posterior aspects of the right zygomatic arch. 4. There is a fracture through the inferior wall of the right orbit with a herniation of a small amount of fat. The rectus muscles not herniated. 5. Fractures through the right maxillary sinus involve the posterior, anterior, superior, and lateral walls. 6. There is a fracture through the right pterygoid which is nondisplaced. 7. There is a fracture through the base the right nasal bones. 8. No fracture or traumatic malalignment in the cervical spine. Findings called to Dr. Wilkie Aye. Electronically Signed   By: Gerome Sam III M.D   On: 11/07/2018 01:27    Assessment/Plan: Traumatic brain injury, left cerebral contusion/subdural hematoma: The patient's head scan is stable.  He is doing well clinically.  I will sign off.  Please call if I can be of further assistance.  LOS: 1 day     Cristi Loron 11/08/2018, 3:38 PM

## 2018-11-08 NOTE — Progress Notes (Signed)
   Providing Compassionate, Quality Care - Together   Subjective: Patient reports head pain much improved from yesterday. He expresses concern about the prospect of facial surgery and when he will be discharged from the hospital.  Objective: Vital signs in last 24 hours: Temp:  [98.2 F (36.8 C)-98.9 F (37.2 C)] 98.4 F (36.9 C) (11/13 0800) Pulse Rate:  [64-84] 84 (11/13 0900) Resp:  [9-21] 17 (11/13 0900) BP: (133-172)/(64-89) 148/75 (11/13 0900) SpO2:  [97 %-100 %] 99 % (11/13 0900)  Intake/Output from previous day: 11/12 0701 - 11/13 0700 In: 2422.3 [P.O.:240; I.V.:2182.3] Out: 1425 [Urine:1425] Intake/Output this shift: Total I/O In: 153.4 [I.V.:153.4] Out: -   Neurologic: Alert and oriented X 3, normal strength and tone. Normal symmetric reflexes. Normal coordination and gait. Right eye swollen shut. Left pupil 3 PERRLA  Lab Results: Recent Labs    11/06/18 2336 11/06/18 2343 11/07/18 0528  WBC 5.5  --  6.6  HGB 10.9* 13.3 10.8*  HCT 35.6* 39.0 34.0*  PLT 189  --  207   BMET Recent Labs    11/07/18 0528 11/08/18 0255  NA 137 133*  K 4.4 3.9  CL 104 101  CO2 20* 24  GLUCOSE 91 145*  BUN 8 <5*  CREATININE 0.68 0.69  CALCIUM 8.7* 9.0    Studies/Results: CT from this morning shows the small right occipital hemorrhagic contusion is stable from previous exam. Small volume left parietal subarachnoid hemorrhage and trace left falcotentorial subdural hematoma are also stable.  Assessment/Plan: Pt is a 57 year-old male s/p fall where he struck his face on the evening of 11/06/18. He sustained multiple facial fractures, a small left parietal SAH, and trace left falcotentorial SDH. His neurological exam and imaging are stable. Agree with Trauma's plan to move him out of the ICU.   LOS: 1 day   Val Eagle, DNP, AGNP-C Nurse Practitioner  [facility] [address], [address], [place], [place] [address] P: [phone]     F: [phone]  11/08/2018, 10:14 AM

## 2018-11-08 NOTE — Progress Notes (Addendum)
   11/08/18 1000  SLP Visit Information  SLP Received On 11/08/18  SLP Time Calculation  SLP Start Time (ACUTE ONLY) 1035  SLP Stop Time (ACUTE ONLY) 1055  SLP Time Calculation (min) (ACUTE ONLY) 20 min  General Information  HPI The patient is a 57 year old  male who was intoxicated and took a fall suffering right orbital and facial fractures.  Head CT was obtained which demonstrated a small subdural hematoma/subarachnoid hemorrhage.    Prior Functional Status  Cognitive/Linguistic Baseline WFL  Vocation On disability  Oral Motor/Sensory Function  Overall Oral Motor/Sensory Function WFL  Cognition  Overall Cognitive Status Impaired/Different from baseline  Arousal/Alertness Awake/alert  Orientation Level Oriented X4  Attention Alternating  Alternating Attention Appears intact  Memory Impaired  Memory Impairment Retrieval deficit  Awareness Appears intact  Problem Solving Appears intact  Auditory Comprehension  Overall Auditory Comprehension Appears within functional limits for tasks assessed  Verbal Expression  Overall Verbal Expression Appears within functional limits for tasks assessed  Motor Speech  Overall Motor Speech Appears within functional limits for tasks assessed  Assessment  Clinical Impression Statement (ACUTE ONLY) Pt demonstrates mild cognitive deficits including extra time needed for processing with more comlex reasoning tasks and cueing needed for memory recall. Pts affect is flat, he also reports needed his glasses which were broken but acknowleges mild memory deficit at baseline as well. Will f/u acutely to monitor for progress and further needs prior to d/c. May benefit from compensatory strategies for memory.   SLP Recommendation/Assessment Patient needs continued Speech Lanaguage Pathology Services  SLP Visit Diagnosis Cognitive communication deficit (R41.841)  Problem List Memory;Problem Solving;Reasoning  Plan  Speech Therapy Frequency (ACUTE ONLY) min  2x/week  Duration 2 weeks  SLP Recommendations  Follow up Recommendations 24 hour supervision/assistance  Individuals Consulted  Consulted and Agree with Results and Recommendations Patient  SLP Evaluations  $ SLP Speech Visit 1 Visit  SLP Evaluations  $ SLP EVAL LANGUAGE/SOUND PRODUCTION 1 Procedure

## 2018-11-08 NOTE — Progress Notes (Addendum)
OT Evaluation  PTA, pt lived in a boarding house and was independent with ADL and mobility. Pt overall minguard with mobility as vision is impaired due to monocular vision (R eye swollen shut) and poor acuity (Glasses broken in fall). Pt may need assistance from CM to set up appointment for pt to get another pair of glasses. Pt demonstrates cognitive deficits however unsure of his baseline. Assume pt has a community CM to S his medication and Landscape architect. Will follow acutely to facilitate safe DC home. Pt states he has friends who can help him after DC.     11/08/18 1400  OT Visit Information  Last OT Received On 11/08/18  Assistance Needed +1  History of Present Illness Pt is a 57 year-old male s/p fall where he struck his face on the evening of 11/06/18. He sustained multiple facial fractures, a small left parietal SAH, and trace left falcotentorial SDH. PMH: bipolar 1 -d/o, schizophrenia, PTSD, anxiety. PSH: R THA 12/2015.  Precautions  Precautions Fall  Precaution Comments facial fractures, R eye swollen shut  Restrictions  Weight Bearing Restrictions No  Home Living  Family/patient expects to be discharged to: Private residence  Living Arrangements Other (Comment) (states he lives in a boarding house)  Available Help at Discharge Personal care attendant;Available 24 hours/day (friends at boarding house)  Type of Home Other(Comment) (boarding home)  Home Access Stairs to enter  Entrance Stairs-Number of Steps 2  Entrance Stairs-Rails Left  Home Layout One level  Bathroom Shower/Tub Tub/shower unit;Walk-in Pension scheme manager Yes  Home Equipment None  Additional Comments pt reports a friend at the home can cook his meals  Prior Function  Level of Independence Independent  Comments reported using a cane but now he doesnt have it anymore  Communication  Communication No difficulties  Pain Assessment  Pain Assessment 0-10  Pain Score  5  Pain Descriptors / Indicators Sore;Grimacing  Pain Intervention(s) Limited activity within patient's tolerance;Patient requesting pain meds-RN notified  Cognition  Arousal/Alertness Awake/alert  Behavior During Therapy Flat affect;Anxious  Overall Cognitive Status No family/caregiver present to determine baseline cognitive functioning  General Comments pt answered appropriately but delayed in response. noted psych history and unsure of baseline level of cognition  Upper Extremity Assessment  Upper Extremity Assessment Overall WFL for tasks assessed  Lower Extremity Assessment  Lower Extremity Assessment Defer to PT evaluation  Cervical / Trunk Assessment  Cervical / Trunk Assessment Normal  ADL  Overall ADL's  Needs assistance/impaired  Eating/Feeding Modified independent  Grooming Oral care;Wash/dry face;Standing;Supervision/safety  Upper Body Bathing Set up;Sitting  Lower Body Bathing Supervison/ safety;Set up;Sit to/from stand  Upper Body Dressing  Set up;Supervision/safety;Sitting  Lower Body Dressing Set up;Supervision/safety;Sit to/from Freight forwarder guard;Ambulation  Toileting- Clothing Manipulation and Hygiene Set up;Sitting/lateral lean  Functional mobility during ADLs Min guard (HHA)  General ADL Comments Pt dropped washcloth adn able to bend over and retrieve from floor without LOB Pt states he feels "dizzy" when moving around (from supine - sit; sit - stand) unable to see L eye to note nystagmus; Pt states he "had this problem before the fall and his doctor said it's related to high blood pressure" but that it is worse now; unsure if pt may have BPPV  Vision- History  Baseline Vision/History Wears glasses  Wears Glasses At all times  Vision- Assessment  Additional Comments States glasses were broken in fall; R eye swollen shut; L eye difficulty wtih sustancining  visual attnetion and has difficulty with tracking; pt staes this is baseline  Perception   Comments appears [facility] [place] [facility]  Praxis  Praxis tested? WFL  Bed Mobility  Overal bed mobility Modified Independent  Transfers  Overall transfer level Needs assistance  Equipment used None  Transfers Sit to/from Stand  Sit to Stand Supervision  General transfer comment increased time  Balance  Overall balance assessment Mild deficits observed, not formally tested  OT - End of Session  Equipment Utilized During Treatment Gait belt  Activity Tolerance Patient tolerated treatment well  Patient left in chair  Nurse Communication Patient requests pain meds  OT Assessment  OT Recommendation/Assessment Patient needs continued OT Services  OT Visit Diagnosis Unsteadiness on feet (R26.81);Other symptoms and signs involving cognitive function;Pain;Low vision, both eyes (H54.2)  Pain - part of body  (head)  OT Problem List Decreased activity tolerance;Impaired vision/perception;Decreased knowledge of precautions;Pain  OT Plan  OT Frequency (ACUTE ONLY) Min 3X/week  OT Treatment/Interventions (ACUTE ONLY) Self-care/ADL training;DME and/or AE instruction;Therapeutic activities;Cognitive remediation/compensation;Visual/perceptual remediation/compensation;Patient/family education;Balance training  AM-PAC OT "6 Clicks" Daily Activity Outcome Measure  Help from another person eating meals? 4  Help from another person taking care of personal grooming? 3  Help from another person toileting, which includes using toliet, bedpan, or urinal? 3  Help from another person bathing (including washing, rinsing, drying)? 3  Help from another person to put on and taking off regular upper body clothing? 3  Help from another person to put on and taking off regular lower body clothing? 3  6 Click Score 19  ADL G Code Conversion CK  OT Recommendation  Follow Up Recommendations No OT follow up;Supervision/Assistance - 24 hour (initially)  OT Equipment Tub/shower seat  Individuals Consulted  Consulted and Agree with  Results and Recommendations Patient  Acute Rehab OT Goals  Patient Stated Goal home  OT Goal Formulation With patient  Time For Goal Achievement 11/22/18  Potential to Achieve Goals Good  OT Time Calculation  OT Start Time (ACUTE ONLY) 1100  OT Stop Time (ACUTE ONLY) 1130  OT Time Calculation (min) 30 min  OT General Charges  $OT Visit 1 Visit  OT Evaluation  $OT Eval Moderate Complexity 1 Mod  OT Treatments  $Self Care/Home Management  8-22 mins  Written Expression  Dominant Hand Right  Luisa Dago, OT/L   Acute OT Clinical Specialist Acute Rehabilitation Services Pager [phone] Office [phone]

## 2018-11-09 MED ORDER — BACITRACIN ZINC 500 UNIT/GM EX OINT
TOPICAL_OINTMENT | Freq: Two times a day (BID) | CUTANEOUS | Status: DC
  Administered 2018-11-09: 1 via TOPICAL
  Filled 2018-11-09: qty 28.35

## 2018-11-09 MED ORDER — OXYCODONE HCL 5 MG PO TABS: 5.0000 mg | ORAL_TABLET | Freq: Four times a day (QID) | ORAL | 0 refills | Status: DC | PRN

## 2018-11-09 MED ORDER — ACETAMINOPHEN 500 MG PO TABS: 1000.0000 mg | ORAL_TABLET | Freq: Four times a day (QID) | ORAL | 0 refills | Status: DC | PRN

## 2018-11-09 MED FILL — oxyCODONE HCL 5 MG TABS: 5 | 3 days supply | Qty: 20 | Fill #0

## 2018-11-09 NOTE — Progress Notes (Signed)
Arranging for surgery for ORIF of right tripod fracture in 1 - 2 weeks. Have requested a 3D CT reconstruction. 

## 2018-11-09 NOTE — Progress Notes (Signed)
Pt getting ready for DC.  Equipment brought to room for home.  Pt has worked with PT and feels steady on feet.  Pt given Bacitracin for old bloody drainage around right eyebrow area.  Meds for home delivered by pharmacy.  DC instructions given and explained, awaiting ride home.

## 2018-11-09 NOTE — Care Management Note (Signed)
Case Management Note  Patient Details  Name: Sean Mccoy MRN: [id number] Date of Birth: 09/18/1961  Subjective/Objective: Pt is a 57 year-old male s/p fall where he struck his face on the evening of 11/06/18. He sustained multiple facial fractures, a small left parietal SAH, and trace left falcotentorial SDH.  PTA, pt independent, lives in a boarding house.                      Action/Plan: Met with pt to discuss dc arrangements.  Pt states residents at the boarding house can assist him with care at dc, if needed.  He is agreeable to [facility] follow up, if possible, and recommended DME.  Referral to [facility] for [facility] and DME needs.    Expected Discharge Date:  11/09/18               Expected Discharge Plan:  [place]  In-House Referral:  Clinical Social Work  Discharge planning Services  CM Consult  Post Acute Care Choice:  Home Health Choice offered to:  Patient  DME Arranged:  Kasandra Knudsen, Shower stool DME Agency:  [place]:  PT [facility] Agency:  [place]  Status of Service:  Completed, signed off  If discussed at [place] of Stay Meetings, dates discussed:    Additional Comments:  11/09/18 J. Glorious Flicker, RN, BSN SBIRT questionnaire completed with pt; he denies need for ETOH resources.    Reinaldo Raddle, RN, BSN  Trauma/Neuro ICU Case Manager [phone]

## 2018-11-09 NOTE — H&P (View-Only) (Signed)
Arranging for surgery for ORIF of right tripod fracture in 1 - 2 weeks. Have requested a 3D CT reconstruction.

## 2018-11-09 NOTE — Progress Notes (Signed)
Physical Therapy Treatment Patient Details Name: Sean Mccoy MRN: [id number] DOB: 02-Jan-1961 Today's Date: 11/09/2018    History of Present Illness Pt is a 57 year-old male s/p fall where he struck his face on the evening of 11/06/18. He sustained multiple facial fractures, a small left parietal SAH, and trace left falcotentorial SDH. PMH: bipolar 1 -d/o, schizophrenia, PTSD, anxiety. PSH: R THA 12/2015.    PT Comments    Patient seen for mobility progression and gait/stair training with use of SPC. Pt tolerated session well and reports no dizziness today. Pt requires supervision/min guard for safety with gait. Continue to progress as tolerated.    Follow Up Recommendations  Home health PT;Supervision/Assistance - 24 hour     Equipment Recommendations  Cane    Recommendations for Other Services       Precautions / Restrictions Precautions Precautions: Fall Precaution Comments: facial fractures    Mobility  Bed Mobility               General bed mobility comments: pt up in bathroom upon arrival  Transfers Overall transfer level: Modified independent Equipment used: None Transfers: Sit to/from Stand           General transfer comment: increased time  Ambulation/Gait Ambulation/Gait assistance: Min Emergency planning/management officer (Feet): 300 Feet Assistive device: Straight cane Gait Pattern/deviations: Step-through pattern Gait velocity: decreased   General Gait Details: cues for sequencing and increased stride length; unsteady initially; no LOB   Stairs Stairs assistance: min guard  Stair management: one rail left; step to pattern; with cane  Number of stairs: 10  General stair comments: min guard for safety; cues for sequencing with use o fSP       Wheelchair Mobility    Modified Rankin (Stroke Patients Only)       Balance Overall balance assessment: Mild deficits observed, not formally tested                                           Cognition Arousal/Alertness: Awake/alert Behavior During Therapy: Flat affect Overall Cognitive Status: No family/caregiver present to determine baseline cognitive functioning                                 General Comments: pt answered appropriately but delayed in response. noted psych history and unsure of baseline level of cognition      Exercises      General Comments General comments (skin integrity, edema, etc.): pt reports no dizziness or double vision; limited R peripheral vision but does have limited vision in R eye today due to decreased swelling; pt is concerned about getting another pair of glasses and RN notified that pt would like to speak with SW about possible financial assist for glasses      Pertinent Vitals/Pain Pain Assessment: Faces Faces Pain Scale: Hurts a little bit Pain Location: face Pain Descriptors / Indicators: Sore Pain Intervention(s): Monitored during session;Premedicated before session    Home Living                      Prior Function            PT Goals (current goals can now be found in the care plan section) Acute Rehab PT Goals Patient Stated Goal: home Progress towards PT goals: Progressing toward goals  Frequency    Min 3X/week      PT Plan Current plan remains appropriate    Co-evaluation              AM-PAC PT "6 Clicks" Daily Activity  Outcome Measure  Difficulty turning over in bed (including adjusting bedclothes, sheets and blankets)?: None Difficulty moving from lying on back to sitting on the side of the bed? : None Difficulty sitting down on and standing up from a chair with arms (e.g., wheelchair, bedside commode, etc,.)?: A Little Help needed moving to and from a bed to chair (including a wheelchair)?: A Little Help needed walking in hospital room?: A Little Help needed climbing 3-5 steps with a railing? : A Little 6 Click Score: 20    End of Session Equipment  Utilized During Treatment: Gait belt Activity Tolerance: Patient tolerated treatment well Patient left: in chair;with call bell/phone within reach Nurse Communication: Mobility status PT Visit Diagnosis: Unsteadiness on feet (R26.81)     Time: 5003-7048 PT Time Calculation (min) (ACUTE ONLY): 20 min  Charges:  $Gait Training: 8-22 mins                     Erline Levine, PTA Acute Rehabilitation Services Pager: [phone] Office: [phone]     Carolynne Edouard 11/09/2018, 5:01 PM

## 2018-11-09 NOTE — Discharge Instructions (Signed)
Full Liquid Diet, NO CHEWING.  You need to get Ensure or Boost from the store to drink for protein as well A full liquid diet may be used:  To help you transition from a clear liquid diet to a soft diet.  When your body is healing and can only tolerate foods that are easy to digest.  Before or after certain a procedure, test, or surgery (such as stomach or intestinal surgeries).  If you have trouble swallowing or chewing.  A full liquid diet includes fluids and foods that are liquid or will become liquid at room temperature. The full liquid diet gives you the proteins, fluids, salts, and minerals that you need for energy. If you continue this diet for more than 72 hours, talk to your health care provider about how many calories you need to consume. If you continue the diet for more than 5 days, talk to your health care provider about taking a multivitamin or a nutritional supplement. What do I need to know about a full liquid diet?  You may have any liquid.  You may have any food that becomes a liquid at room temperature. The food is considered a liquid if it can be poured off a spoon at room temperature.  Drink one serving of citrus or vitamin C-enriched fruit juice daily. What foods can I eat? Grains Any grain food that can be pureed in soup (such as crackers, pasta, and rice). Hot cereal (such as farina or oatmeal) that has been blended. Talk to your health care provider or dietitian about these foods. Vegetables Pulp-free tomato or vegetable juice. Vegetables pureed in soup. Fruits Fruit juice, including nectars and juices with pulp. Meats and Other Protein Sources Eggs in custard, eggnog mix, and eggs used in ice cream or pudding. Strained meats, like in baby food, may be allowed. Consult your health care provider. Dairy Milk and milk-based beverages, including milk shakes and instant breakfast mixes. Smooth yogurt. Pureed cottage cheese. Avoid these foods if they are not well  tolerated. Beverages All beverages, including liquid nutritional supplements. Ask your health care provider if you can have carbonated beverages. They may not be well tolerated. Condiments Iodized salt, pepper, spices, and flavorings. Cocoa powder. Vinegar, ketchup, yellow mustard, smooth sauces (such as hollandaise, cheese sauce, or white sauce), and soy sauce. Sweets and Desserts Custard, smooth pudding. Flavored gelatin. Tapioca, junket. Plain ice cream, sherbet, fruit ices. Frozen ice pops, frozen fudge pops, pudding pops, and other frozen bars with cream. Syrups, including chocolate syrup. Sugar, honey, jelly. Fats and Oils Margarine, butter, cream, sour cream, and oils. Other Broth and cream soups. Strained, broth-based soups. The items listed above may not be a complete list of recommended foods or beverages. Contact your dietitian for more options. What foods can I not eat? Grains All breads. Grains are not allowed unless they are pureed into soup. Vegetables Vegetables are not allowed unless they are juiced, or cooked and pureed into soup. Fruits Fruits are not allowed unless they are juiced. Meats and Other Protein Sources Any meat or fish. Cooked or raw eggs. Nut butters. Dairy Cheese. Condiments Stone ground mustards. Fats and Oils Fats that are coarse or chunky. Sweets and Desserts Ice cream or other frozen desserts that have any solids in them or on top, such as nuts, chocolate chips, and pieces of cookies. Cakes. Cookies. Candy. Others Soups with chunks or pieces in them. The items listed above may not be a complete list of foods and beverages to avoid.  Contact your dietitian for more information. This information is not intended to replace advice given to you by your health care provider. Make sure you discuss any questions you have with your health care provider. Document Released: 12/13/2005 Document Revised: 05/20/2016 Document Reviewed: 10/18/2013 Elsevier  Interactive Patient Education  2017 [employer]   Zygoma Fracture A zygoma fracture is a break in one of the bones in your face. Your zygoma forms the part of your cheekbone that you can feel under your eye. The main part of your zygoma meets the bone that forms the middle part of your face (maxillary bone) under your eye. Your zygoma also has an arched part that extends along the side of your face to meet the bone that forms the side of your head (temporalbone). A zygoma fracture may involve the main part of the bone, the arch of the bone, or both parts of the bone. What are the causes? A zygoma fracture is most often caused by injury or trauma from:  A car accident.  A direct blow to the face.  A sports injury.  A fall.  What increases the risk? You may be more likely to have a zygoma fracture if:  You participate in contact sports.  You are a victim of violence or participate in violent activities or behaviors.  What are the signs or symptoms? Symptoms of a zygoma fracture include:  Swelling.  Bruising.  Pain.  Difficulty or pain when chewing.  A feeling that your teeth are out of line.  As swelling goes down, your face may look different because your cheekbone is flat or set back (depressed). If the fracture extends into bones that supports your eye (blowout fracture), you may have double vision or numbness in your cheek. How is this diagnosed? Your health care provider may suspect a zygoma fracture based on your symptoms, especially if you had a recent injury. Your health care provider will perform a physical exam to check your cheekbone area and feel whether your zygoma is depressed or separated. Your health care provider may also do other tests to confirm the diagnosis and check for other injuries. These may include:  X-rays.  CT scan.  Eye exam.  How is this treated? Treatment depends on the type of fracture you have and how severe it is. You may have to  wait for treatment until the swelling and inflammation decreases.  If you have a fracture that does not cause any deformity or change in your chewing (non-displaced fracture), you may not need treatment.  If you have trouble opening your mouth or you have a cheekbone deformity (displaced fracture), you may need surgery. Surgery may involve either: ? A closed reduction. A small surgical cut (incision) is made inside your mouth or on the side of your head. The surgeon inserts a smooth, blunt surgical instrument through the incision to lift the bone back into proper position. ? An open reduction. This may require a small incision over the fracture site. The fracture is put back into proper position. It will be held in place with wires or with screws and metal plates.  Follow these instructions at home:  Take medicines only as directed by your health care provider.  Ask your health care provider whether you can use an ice pack on your cheek to relieve swelling before or after surgery. If directed, apply ice to the injured area: ? Put ice in a plastic bag. ? Place a towel between your skin and the  bag. ? Leave the ice on for 20 minutes, 2-3 times per day.  Eat a soft or liquid diet until your health care provider says it is okay for you to chew.  Wash and dry your face gently.  Do not participate in activities that put you at risk for injuring the area again.  Wear protective gear as directed by your health care provider, especially when participating in sports or activities that put you at risk for re-injury.  Keep all follow-up visits as directed by your health care provider. This is important. Contact a health care provider if:  Pain or inflammation does not decrease with medicines.  Swelling or bruising of the injured area gets worse.  You develop any vision problems.  You have a lot of clear watery discharge from your nose.  You have a fever.  You have nausea or vomiting. Get  help right away if:  You have trouble moving your mouth.  You have trouble breathing or swallowing.  You develop a severe headache. This information is not intended to replace advice given to you by your health care provider. Make sure you discuss any questions you have with your health care provider. Document Released: 09/07/2001 Document Revised: 05/20/2016 Document Reviewed: 09/18/2014 Elsevier Interactive Patient Education  [employer].

## 2018-11-09 NOTE — Discharge Summary (Signed)
Patient ID: Sean Mccoy [id number] Sep 12, 1961 57 y.o.  Admit date: 11/06/2018 Discharge date: 11/09/2018  Admitting Diagnosis: Fall Multiple R facial fractures Intoxication Small L SDH Small L SAH Anemia of unknown etiology Elevated transaminases   Discharge Diagnosis Patient Active Problem List   Diagnosis Date Noted  . SDH (subdural hematoma) (HCC) 11/07/2018  . 'light-for-dates' infant with signs of fetal malnutrition 03/20/2018  . Polysubstance (including opioids) dependence w/o physiol dependence (HCC) 09/11/2016  . Constipation due to pain medication therapy 02/17/2016  . Postoperative anemia due to acute blood loss 02/17/2016  . Osteoporosis 02/17/2016  . Right knee pain 02/17/2016  . Avascular necrosis of bone of right hip (HCC) 01/20/2016  . 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 Depression 11/29/2012  . Anxiety 11/29/2012  . Post traumatic stress disorder 11/29/2012  . Substance induced mood disorder (HCC) 11/29/2012  . Alcohol abuse 11/29/2012  Fall Multiple R facial fractures Intoxication Small L SDH Small L SAH Anemia of unknown etiology Elevated transaminases   Consultants Dr. Delma Officer, NS Dr. Jordan Likes, plastics, facial trauma  Reason for Admission: This is a 57 year old male who presents by EMS after a fall as a nonactivated trauma. Per EMS, he was witnessed by bystander to fall face first from standing onto concrete walkway. No known loss of consciousness. EMS noted that he was very sleepy with pinpoint pupils.He was given 0.4 mg of Narcan with interval improvement. Patientwas able to tell EDP his name and follow commands but does not contribute to history taking. He has obvious injury of the right side of his face. He denies alcohol or drug use but was found to have alcohol on his person.  We were called to admit the patient because of the small amount of subarachnoid hemorrhage and numerous facial  fractures.  He states that he takes oxycodone on a daily basis.  He states that he takes 1 pill 3 times a day of 7.5 mg.  He mainly complains of head pain and right facial pain.  He denies any neck pain, extremity pain, abdominal pain.  He denies any shortness of breath or chest pain.  He does not recall the events.  He states that he drinks but does not really drink daily.  He denies any drug use other than the prescription pain medication.  Procedures None  Hospital Course:  The patient was admitted and found to have the above injuries.  NS evaluated the patient and a repeat head CT was ordered the following day.  This revealed stability of his intracranial hemorrhage.  He was neurologically intact and no further follow up was warranted.  Facial trauma evaluated the patient as well and felt like given his multitude of fractures as well as malocclusion, he would require ORIF once his edema resolved.  On HD 2, the patient was tolerating a full liquid diet to avoid chewing and was otherwise stable with good pain control and neurologically intact.  He was stable for DC home with appropriate follow up in place.  Physical Exam: Gen: NAD HEENT: still with edema of his face and some dried blood. Heart: regular Lungs: CTAB Abd: soft, NT, ND, +BS Neuro: NVI, follow commands oriented.  Allergies as of 11/09/2018   No Known Allergies     Medication List    STOP taking these medications   alendronate 70 MG tablet Commonly known as:  FOSAMAX   aspirin 325 MG EC tablet   calcium carbonate 500 MG chewable  tablet Commonly known as:  TUMS - dosed in mg elemental calcium   dicyclomine 20 MG tablet Commonly known as:  BENTYL   ferrous sulfate 325 (65 FE) MG tablet   indomethacin 50 MG capsule Commonly known as:  INDOCIN   loperamide 2 MG capsule Commonly known as:  IMODIUM   Menthol (Topical Analgesic) 4 % Gel   naproxen 375 MG tablet Commonly known as:  NAPROSYN   ondansetron 4 MG  disintegrating tablet Commonly known as:  ZOFRAN-ODT   sulfacetamide 10 % ophthalmic solution Commonly known as:  BLEPH-10   Vitamin D 50 MCG (2000 UT) Caps     TAKE these medications   acetaminophen 500 MG tablet Commonly known as:  TYLENOL Take 2 tablets (1,000 mg total) by mouth every 6 (six) hours as needed. What changed:    how much to take  when to take this  reasons to take this   oxyCODONE 5 MG immediate release tablet Commonly known as:  Oxy IR/ROXICODONE Take 1-2 tablets (5-10 mg total) by mouth every 6 (six) hours as needed for moderate pain.        Follow-up Information    Dillingham, Alena Bills, DO. Schedule an appointment as soon as possible for a visit in 1 week(s).   Specialty:  Plastic Surgery Why:  Call to schedule your appointment to discuss timing of surgery Contact information: [address] [place] [address] [phone]        Tressie Stalker, MD Follow up.   Specialty:  Neurosurgery Why:  call as needed for symptoms related to your small head bleed Contact information: [address] [place] [address] [phone]        [facility] Follow up.   Why:  call as needed with questions Contact information: [address] [place] [address] [phone]          Signed: Barnetta Chapel, [facility] 11/09/2018, 8:16 AM Pager: [phone]

## 2018-11-14 ENCOUNTER — Other Ambulatory Visit: Payer: Self-pay

## 2018-11-14 ENCOUNTER — Encounter (HOSPITAL_BASED_OUTPATIENT_CLINIC_OR_DEPARTMENT_OTHER): Payer: Self-pay | Admitting: *Deleted

## 2018-11-17 ENCOUNTER — Encounter: Payer: Self-pay | Admitting: Plastic Surgery

## 2018-11-21 ENCOUNTER — Encounter (HOSPITAL_BASED_OUTPATIENT_CLINIC_OR_DEPARTMENT_OTHER): Payer: Self-pay | Admitting: Anesthesiology

## 2018-11-21 ENCOUNTER — Other Ambulatory Visit: Payer: Self-pay | Admitting: Physician Assistant

## 2018-11-21 MED ORDER — HYDROCODONE-ACETAMINOPHEN 5-325 MG PO TABS: 1.0000 | ORAL_TABLET | Freq: Four times a day (QID) | ORAL | 0 refills | Status: AC | PRN

## 2018-11-21 MED ORDER — ONDANSETRON HCL 4 MG PO TABS: 4.0000 mg | ORAL_TABLET | Freq: Every day | ORAL | 0 refills | Status: AC | PRN

## 2018-11-21 MED ORDER — CEPHALEXIN 500 MG PO CAPS: 500.0000 mg | ORAL_CAPSULE | Freq: Four times a day (QID) | ORAL | 0 refills | Status: AC

## 2018-11-21 NOTE — Anesthesia Preprocedure Evaluation (Addendum)
Anesthesia Evaluation  Patient identified by MRN, date of birth, ID band Patient awake    Reviewed: Allergy & Precautions, NPO status , Patient's Chart, lab work & pertinent test results  Airway Mallampati: III  TM Distance: >3 FB Neck ROM: Full  Mouth opening: Limited Mouth Opening  Dental  (+) Loose, Chipped   Pulmonary neg pulmonary ROS,    Pulmonary exam normal breath sounds clear to auscultation       Cardiovascular negative cardio ROS Normal cardiovascular exam Rhythm:Regular Rate:Normal     Neuro/Psych PSYCHIATRIC DISORDERS Anxiety Depression Hx/o PTSDSmall left SAH Small left SDH    GI/Hepatic negative GI ROS, (+)     substance abuse  alcohol use,   Endo/Other  negative endocrine ROS  Renal/GU negative Renal ROS   BPH    Musculoskeletal  (+) Arthritis , Rheumatoid disorders,  narcotic dependentOsteoporosis Right orbital floor Fx    Abdominal   Peds  Hematology  (+) anemia ,   Anesthesia Other Findings   Reproductive/Obstetrics                            Anesthesia Physical Anesthesia Plan  ASA: III  Anesthesia Plan: General   Post-op Pain Management:    Induction: Intravenous  PONV Risk Score and Plan: 3 and Ondansetron, Dexamethasone and Treatment may vary due to age or medical condition  Airway Management Planned: Nasal ETT  Additional Equipment:   Intra-op Plan:   Post-operative Plan: Extubation in OR  Informed Consent: I have reviewed the patients History and Physical, chart, labs and discussed the procedure including the risks, benefits and alternatives for the proposed anesthesia with the patient or authorized representative who has indicated his/her understanding and acceptance.   Dental advisory given  Plan Discussed with: CRNA and Surgeon  Anesthesia Plan Comments:        Anesthesia Quick Evaluation

## 2018-11-22 ENCOUNTER — Ambulatory Visit (HOSPITAL_BASED_OUTPATIENT_CLINIC_OR_DEPARTMENT_OTHER)
Admission: RE | Admit: 2018-11-22 | Discharge: 2018-11-22 | Disposition: A | Discharge status: Home / Self Care | Payer: Medicaid Other | Source: Ambulatory Visit | Attending: Plastic Surgery | Admitting: Plastic Surgery

## 2018-11-22 ENCOUNTER — Encounter: Payer: Self-pay | Admitting: Plastic Surgery

## 2018-11-22 ENCOUNTER — Ambulatory Visit (HOSPITAL_BASED_OUTPATIENT_CLINIC_OR_DEPARTMENT_OTHER): Payer: Medicaid Other | Admitting: Anesthesiology

## 2018-11-22 ENCOUNTER — Encounter (HOSPITAL_BASED_OUTPATIENT_CLINIC_OR_DEPARTMENT_OTHER)
Admission: RE | Disposition: A | Discharge status: Home / Self Care | Payer: Self-pay | Source: Ambulatory Visit | Attending: Plastic Surgery

## 2018-11-22 ENCOUNTER — Encounter (HOSPITAL_BASED_OUTPATIENT_CLINIC_OR_DEPARTMENT_OTHER): Payer: Self-pay | Admitting: Anesthesiology

## 2018-11-22 ENCOUNTER — Other Ambulatory Visit: Payer: Self-pay

## 2018-11-22 DIAGNOSIS — Y92480 Sidewalk as the place of occurrence of the external cause: Secondary | ICD-10-CM | POA: Insufficient documentation

## 2018-11-22 DIAGNOSIS — S0240CA Maxillary fracture, right side, initial encounter for closed fracture: Secondary | ICD-10-CM | POA: Diagnosis not present

## 2018-11-22 DIAGNOSIS — S022XXA Fracture of nasal bones, initial encounter for closed fracture: Secondary | ICD-10-CM

## 2018-11-22 DIAGNOSIS — S0240EB Zygomatic fracture, right side, initial encounter for open fracture: Secondary | ICD-10-CM | POA: Diagnosis not present

## 2018-11-22 DIAGNOSIS — S0240EA Zygomatic fracture, right side, initial encounter for closed fracture: Secondary | ICD-10-CM | POA: Insufficient documentation

## 2018-11-22 DIAGNOSIS — S0231XA Fracture of orbital floor, right side, initial encounter for closed fracture: Secondary | ICD-10-CM

## 2018-11-22 DIAGNOSIS — W19XXXA Unspecified fall, initial encounter: Secondary | ICD-10-CM | POA: Diagnosis not present

## 2018-11-22 HISTORY — PX: ORIF ORBITAL FRACTURE: SHX5312

## 2018-11-22 HISTORY — DX: Benign prostatic hyperplasia without lower urinary tract symptoms: N40.0

## 2018-11-22 HISTORY — DX: Abrasion of other part of head, initial encounter: S00.81XA

## 2018-11-22 HISTORY — DX: Rheumatoid arthritis, unspecified: M06.9

## 2018-11-22 HISTORY — DX: Fracture of orbital floor, unspecified side, initial encounter for closed fracture: S02.30XA

## 2018-11-22 SURGERY — OPEN REDUCTION INTERNAL FIXATION (ORIF) ORBITAL FRACTURE
Anesthesia: General | Site: Face | Laterality: Right

## 2018-11-22 MED ORDER — MEPERIDINE HCL 25 MG/ML IJ SOLN: 6.2500 mg | INTRAMUSCULAR | Status: DC | PRN

## 2018-11-22 MED ORDER — SODIUM CHLORIDE 0.9% FLUSH: 3.0000 mL | INTRAVENOUS | Status: DC | PRN

## 2018-11-22 MED ORDER — OXYCODONE HCL 5 MG PO TABS
ORAL_TABLET | ORAL | Status: AC
  Filled 2018-11-22: qty 1

## 2018-11-22 MED ORDER — CEFAZOLIN SODIUM-DEXTROSE 2-4 GM/100ML-% IV SOLN
2.0000 g | INTRAVENOUS | Status: AC
  Administered 2018-11-22: 2 g via INTRAVENOUS

## 2018-11-22 MED ORDER — HYDROMORPHONE HCL 1 MG/ML IJ SOLN
INTRAMUSCULAR | Status: AC
  Filled 2018-11-22: qty 0.5

## 2018-11-22 MED ORDER — SUGAMMADEX SODIUM 200 MG/2ML IV SOLN
INTRAVENOUS | Status: DC | PRN
  Administered 2018-11-22: 200 mg via INTRAVENOUS

## 2018-11-22 MED ORDER — MIDAZOLAM HCL 2 MG/2ML IJ SOLN
1.0000 mg | INTRAMUSCULAR | Status: DC | PRN
  Administered 2018-11-22 (×2): 1 mg via INTRAVENOUS

## 2018-11-22 MED ORDER — SODIUM CHLORIDE 0.9% FLUSH: 3.0000 mL | Freq: Two times a day (BID) | INTRAVENOUS | Status: DC

## 2018-11-22 MED ORDER — ACETAMINOPHEN 650 MG RE SUPP: 650.0000 mg | RECTAL | Status: DC | PRN

## 2018-11-22 MED ORDER — MIDAZOLAM HCL 2 MG/2ML IJ SOLN
INTRAMUSCULAR | Status: AC
  Filled 2018-11-22: qty 2

## 2018-11-22 MED ORDER — LACTATED RINGERS IV SOLN
INTRAVENOUS | Status: DC
  Administered 2018-11-22 (×2): via INTRAVENOUS

## 2018-11-22 MED ORDER — LABETALOL HCL 5 MG/ML IV SOLN
INTRAVENOUS | Status: AC
  Filled 2018-11-22: qty 4

## 2018-11-22 MED ORDER — LIDOCAINE-EPINEPHRINE 1 %-1:100000 IJ SOLN
INTRAMUSCULAR | Status: AC
  Filled 2018-11-22: qty 1

## 2018-11-22 MED ORDER — FENTANYL CITRATE (PF) 100 MCG/2ML IJ SOLN
50.0000 ug | INTRAMUSCULAR | Status: DC | PRN
  Administered 2018-11-22 (×2): 50 ug via INTRAVENOUS

## 2018-11-22 MED ORDER — DEXAMETHASONE SODIUM PHOSPHATE 4 MG/ML IJ SOLN
INTRAMUSCULAR | Status: DC | PRN
  Administered 2018-11-22: 10 mg via INTRAVENOUS

## 2018-11-22 MED ORDER — HYDROMORPHONE HCL 1 MG/ML IJ SOLN
0.2500 mg | INTRAMUSCULAR | Status: DC | PRN
  Administered 2018-11-22 (×2): 0.5 mg via INTRAVENOUS

## 2018-11-22 MED ORDER — ONDANSETRON HCL 4 MG/2ML IJ SOLN
4.0000 mg | Freq: Once | INTRAMUSCULAR | Status: AC | PRN
  Administered 2018-11-22: 4 mg via INTRAVENOUS

## 2018-11-22 MED ORDER — FENTANYL CITRATE (PF) 100 MCG/2ML IJ SOLN
INTRAMUSCULAR | Status: AC
  Filled 2018-11-22: qty 2

## 2018-11-22 MED ORDER — CEFAZOLIN SODIUM-DEXTROSE 2-4 GM/100ML-% IV SOLN
INTRAVENOUS | Status: AC
  Filled 2018-11-22: qty 100

## 2018-11-22 MED ORDER — OXYMETAZOLINE HCL 0.05 % NA SOLN
NASAL | Status: AC
  Filled 2018-11-22: qty 15

## 2018-11-22 MED ORDER — HYDROCODONE-ACETAMINOPHEN 7.5-325 MG PO TABS: 1.0000 | ORAL_TABLET | Freq: Once | ORAL | Status: DC | PRN

## 2018-11-22 MED ORDER — ROCURONIUM BROMIDE 100 MG/10ML IV SOLN
INTRAVENOUS | Status: DC | PRN
  Administered 2018-11-22: 40 mg via INTRAVENOUS

## 2018-11-22 MED ORDER — SODIUM CHLORIDE 0.9 % IV SOLN: 250.0000 mL | INTRAVENOUS | Status: DC | PRN

## 2018-11-22 MED ORDER — BSS IO SOLN
INTRAOCULAR | Status: AC
  Filled 2018-11-22: qty 15

## 2018-11-22 MED ORDER — SCOPOLAMINE 1 MG/3DAYS TD PT72: 1.0000 | MEDICATED_PATCH | Freq: Once | TRANSDERMAL | Status: DC | PRN

## 2018-11-22 MED ORDER — LABETALOL HCL 5 MG/ML IV SOLN
5.0000 mg | INTRAVENOUS | Status: AC | PRN
  Administered 2018-11-22 (×2): 5 mg via INTRAVENOUS

## 2018-11-22 MED ORDER — LIDOCAINE-EPINEPHRINE 1 %-1:100000 IJ SOLN
INTRAMUSCULAR | Status: DC | PRN
  Administered 2018-11-22: 2 mL

## 2018-11-22 MED ORDER — ACETAMINOPHEN 325 MG PO TABS: 650.0000 mg | ORAL_TABLET | ORAL | Status: DC | PRN

## 2018-11-22 MED ORDER — OXYCODONE HCL 5 MG PO TABS
5.0000 mg | ORAL_TABLET | ORAL | Status: DC | PRN
  Administered 2018-11-22: 5 mg via ORAL

## 2018-11-22 SURGICAL SUPPLY — 56 items
ADH SKN CLS APL DERMABOND .7 (GAUZE/BANDAGES/DRESSINGS)
APL SRG 3 HI ABS STRL LF PLS (MISCELLANEOUS) ×1
APPLICATOR DR MATTHEWS STRL (MISCELLANEOUS) ×2 IMPLANT
BIT DRILL TWIST W/O 1.5X50 (BIT) IMPLANT
BLADE MANDIBULAR SCREWDRIVER (BLADE) IMPLANT
CANISTER SUCT 1200ML W/VALVE (MISCELLANEOUS) ×2 IMPLANT
CLOSURE WOUND 1/2 X4 (GAUZE/BANDAGES/DRESSINGS)
COVER WAND RF STERILE (DRAPES) IMPLANT
DECANTER SPIKE VIAL GLASS SM (MISCELLANEOUS) ×3 IMPLANT
DERMABOND ADVANCED (GAUZE/BANDAGES/DRESSINGS)
DERMABOND ADVANCED .7 DNX12 (GAUZE/BANDAGES/DRESSINGS) IMPLANT
DRILL TW 1.1X50 7MM STOP W/NT (MISCELLANEOUS) IMPLANT
ELECT COATED BLADE 2.86 ST (ELECTRODE) ×3 IMPLANT
ELECT NDL BLADE 2-5/6 (NEEDLE) ×1 IMPLANT
ELECT NEEDLE BLADE 2-5/6 (NEEDLE) ×3 IMPLANT
ELECT REM PT RETURN 9FT ADLT (ELECTROSURGICAL) ×3
ELECTRODE REM PT RTRN 9FT ADLT (ELECTROSURGICAL) ×1 IMPLANT
GAUZE PACKING FOLDED 2  STR (GAUZE/BANDAGES/DRESSINGS)
GAUZE PACKING FOLDED 2 STR (GAUZE/BANDAGES/DRESSINGS) IMPLANT
GLOVE BIO SURGEON STRL SZ 6.5 (GLOVE) ×4 IMPLANT
GLOVE BIO SURGEON STRL SZ7 (GLOVE) ×2 IMPLANT
GLOVE BIO SURGEONS STRL SZ 6.5 (GLOVE) ×2
GLOVE BIOGEL PI IND STRL 7.5 (GLOVE) IMPLANT
GLOVE BIOGEL PI INDICATOR 7.5 (GLOVE) ×2
GOWN STRL REUS W/ TWL LRG LVL3 (GOWN DISPOSABLE) ×2 IMPLANT
GOWN STRL REUS W/TWL LRG LVL3 (GOWN DISPOSABLE) ×6
NDL HYPO 30GX1 BEV (NEEDLE) ×1 IMPLANT
NDL PRECISIONGLIDE 27X1.5 (NEEDLE) IMPLANT
NEEDLE HYPO 30GX1 BEV (NEEDLE) ×3 IMPLANT
NEEDLE PRECISIONGLIDE 27X1.5 (NEEDLE) IMPLANT
NS IRRIG 1000ML POUR BTL (IV SOLUTION) ×3 IMPLANT
PACK BASIN DAY SURGERY FS (CUSTOM PROCEDURE TRAY) ×3 IMPLANT
PACK ENT DAY SURGERY (CUSTOM PROCEDURE TRAY) ×3 IMPLANT
PATTIES SURGICAL .5 X3 (DISPOSABLE) IMPLANT
PENCIL BUTTON HOLSTER BLD 10FT (ELECTRODE) ×3 IMPLANT
PLATE DOUBLE 6 HOLE (Plate) ×2 IMPLANT
SCREW HT X DR EMRG 1.8X5MM (Screw) ×4 IMPLANT
SCREW SELF DRILL HT 1.5/4MM (Screw) ×4 IMPLANT
SHEILD EYE MED CORNL SHD 22X21 (OPHTHALMIC RELATED) ×3
SHIELD EYE MED CORNL SHD 22X21 (OPHTHALMIC RELATED) ×1 IMPLANT
SLEEVE SCD COMPRESS KNEE MED (MISCELLANEOUS) ×3 IMPLANT
STRIP CLOSURE SKIN 1/2X4 (GAUZE/BANDAGES/DRESSINGS) IMPLANT
SUT MON AB 5-0 P3 18 (SUTURE) IMPLANT
SUT PROLENE 6 0 P 1 18 (SUTURE) IMPLANT
SUT SILK 6 0 P 1 (SUTURE) ×3 IMPLANT
SUT VIC AB 4-0 P-3 18XBRD (SUTURE) IMPLANT
SUT VIC AB 4-0 P3 18 (SUTURE) ×3
SUT VIC AB 4-0 SH 27 (SUTURE)
SUT VIC AB 4-0 SH 27XANBCTRL (SUTURE) IMPLANT
SUT VIC AB 5-0 P-3 18X BRD (SUTURE) IMPLANT
SUT VIC AB 5-0 P3 18 (SUTURE)
SYR BULB 3OZ (MISCELLANEOUS) ×2 IMPLANT
TOWEL GREEN STERILE FF (TOWEL DISPOSABLE) ×3 IMPLANT
TRAY DSU PREP LF (CUSTOM PROCEDURE TRAY) ×3 IMPLANT
TW DRILL1.1X50 7MM STOP W/NT (MISCELLANEOUS) ×3
WIRE 24 GAUGE OMINIMAX MMF (WIRE) IMPLANT

## 2018-11-22 NOTE — Anesthesia Postprocedure Evaluation (Signed)
Anesthesia Post Note  Patient: Sean Mccoy  Procedure(s) Performed: OPEN REDUCTION INTERNAL FIXATION (ORIF) RIGHT TRIPOD FRACTURE (Right Face)     Patient location during evaluation: PACU Anesthesia Type: General Level of consciousness: awake and alert Pain management: pain level controlled Vital Signs Assessment: post-procedure vital signs reviewed and stable Respiratory status: spontaneous breathing, nonlabored ventilation and respiratory function stable Cardiovascular status: blood pressure returned to baseline and stable Postop Assessment: no apparent nausea or vomiting Anesthetic complications: no Comments: Given Labetalol in PACU for HTN.    Last Vitals:  Vitals:   11/22/18 0930 11/22/18 0945  BP: (!) 166/86 (!) 167/96  Pulse: 91 88  Resp: 12 13  Temp:    SpO2: 91% 94%    Last Pain:  Vitals:   11/22/18 0945  TempSrc:   PainSc: Asleep                 [name] A.

## 2018-11-22 NOTE — Addendum Note (Signed)
Addendum  created 11/22/18 1438 by Ronnette Hila, CRNA   Intraprocedure Meds edited

## 2018-11-22 NOTE — Transfer of Care (Signed)
Immediate Anesthesia Transfer of Care Note  Patient: Sean Mccoy  Procedure(s) Performed: OPEN REDUCTION INTERNAL FIXATION (ORIF) RIGHT TRIPOD FRACTURE (Right Face)  Patient Location: PACU  Anesthesia Type:General  Level of Consciousness: awake and drowsy  Airway & Oxygen Therapy: Patient Spontanous Breathing and Patient connected to face mask oxygen  Post-op Assessment: Report given to RN and Post -op Vital signs reviewed and stable  Post vital signs: Reviewed and stable  Last Vitals:  Vitals Value Taken Time  BP 168/93 11/22/2018  8:50 AM  Temp    Pulse 99 11/22/2018  8:52 AM  Resp 13 11/22/2018  8:52 AM  SpO2 100 % 11/22/2018  8:52 AM  Vitals shown include unvalidated device data.  Last Pain:  Vitals:   11/22/18 0717  TempSrc: Oral  PainSc: 8          Complications: No apparent anesthesia complications

## 2018-11-22 NOTE — Discharge Instructions (Signed)
Soft diet No Straws Follow up with Dr Ulice Bold office as needed for Post op appointment  Call your surgeon if you experience:   1.  Fever over 101.0. 2.  Inability to urinate. 3.  Nausea and/or vomiting. 4.  Extreme swelling or bruising at the surgical site. 5.  Continued bleeding from the incision. 6.  Increased pain, redness or drainage from the incision. 7.  Problems related to your pain medication. 8.  Any problems and/or concerns   Post Anesthesia Home Care Instructions  Activity: Get plenty of rest for the remainder of the day. A responsible individual must stay with you for 24 hours following the procedure.  For the next 24 hours, DO NOT: -Drive a car -Advertising copywriter -Drink alcoholic beverages -Take any medication unless instructed by your physician -Make any legal decisions or sign important papers.  Meals: Start with liquid foods such as gelatin or soup. Progress to regular foods as tolerated. Avoid greasy, spicy, heavy foods. If nausea and/or vomiting occur, drink only clear liquids until the nausea and/or vomiting subsides. Call your physician if vomiting continues.  Special Instructions/Symptoms: Your throat may feel dry or sore from the anesthesia or the breathing tube placed in your throat during surgery. If this causes discomfort, gargle with warm salt water. The discomfort should disappear within 24 hours.  If you had a scopolamine patch placed behind your ear for the management of post- operative nausea and/or vomiting:  1. The medication in the patch is effective for 72 hours, after which it should be removed.  Wrap patch in a tissue and discard in the trash. Wash hands thoroughly with soap and water. 2. You may remove the patch earlier than 72 hours if you experience unpleasant side effects which may include dry mouth, dizziness or visual disturbances. 3. Avoid touching the patch. Wash your hands with soap and water after contact with the patch.

## 2018-11-22 NOTE — Interval H&P Note (Signed)
History and Physical Interval Note:  11/22/2018 7:08 AM  Sean Mccoy  has presented today for surgery, with the diagnosis of closed fracture of right orbital floor  The various methods of treatment have been discussed with the patient and family. After consideration of risks, benefits and other options for treatment, the patient has consented to  Procedure(s): OPEN REDUCTION INTERNAL FIXATION (ORIF) RIGHT TRIPOD FRACTURE (Right) as a surgical intervention .  The patient's history has been reviewed, patient examined, no change in status, stable for surgery.  I have reviewed the patient's chart and labs.  Questions were answered to the patient's satisfaction.     [name]

## 2018-11-22 NOTE — Op Note (Signed)
DATE OF OPERATION: 11/22/2018  LOCATION: [facility]  PREOPERATIVE DIAGNOSIS: Right tripod fracture  POSTOPERATIVE DIAGNOSIS: Same  PROCEDURE: Open reduction internal fixation of maxillary wall fracture  SURGEON: Rosezella Kronick Sanger Chloie Loney, DO  EBL: 2 cc  CONDITION: Stable  COMPLICATIONS: None  INDICATION: The patient, Sean Mccoy, is a 57 y.o. male born on 06/14/1961, is here for treatment of a right tripod fracture after a fall.  The fractures were not displaced but the patient was complaining of malocclusion.     PROCEDURE DETAILS:  The patient was seen prior to surgery and marked.  The IV antibiotics were given. The patient was taken to the operating room and given a general anesthetic. A standard time out was performed and all information was confirmed by those in the room. SCDs were placed.   The face was prepped and draped in the usual sterile fashion.  Local was injected into the right upper buccal mucosa. The needle tip bovie was used to make an incision in the buccal mucosa.  Hemostasis was achieved on the way.  The periosteal bone elevator was used to free the bone at the fracture site.  There was good alignment and very good dental occlusion.  A double Y plate was selected and formed to the site.  The 4 and 5 mm screws were used to secure the plate in place.  The buccal mucosa was closed with the 4-0 Vicryl vertical mattress sutures.  Due to the excellent alignment the decision was made to note open the other sites.  The patient was allowed to wake up and taken to recovery room in stable condition at the end of the case. The family was notified at the end of the case.

## 2018-11-22 NOTE — Anesthesia Procedure Notes (Addendum)
Procedure Name: Intubation Performed by: Willa Frater, CRNA Pre-anesthesia Checklist: Patient identified, Emergency Drugs available, Suction available and Patient being monitored Patient Re-evaluated:Patient Re-evaluated prior to induction Oxygen Delivery Method: Circle system utilized Preoxygenation: Pre-oxygenation with 100% oxygen Induction Type: IV induction Ventilation: Mask ventilation without difficulty Laryngoscope Size: Mac and 3 Grade View: Grade II Tube type: Oral Rae Nasal Tubes: Nasal prep performed, Nasal Rae, Left and Magill forceps- large, utilized Tube size: 6.5 mm Placement Confirmation: ETT inserted through vocal cords under direct vision,  positive ETCO2 and breath sounds checked- equal and bilateral Secured at: 27 (l nare) cm Tube secured with: Tape Dental Injury: Teeth and Oropharynx as per pre-operative assessment

## 2018-11-28 ENCOUNTER — Encounter (HOSPITAL_BASED_OUTPATIENT_CLINIC_OR_DEPARTMENT_OTHER): Payer: Self-pay | Admitting: Plastic Surgery

## 2018-12-25 ENCOUNTER — Telehealth: Payer: Self-pay

## 2018-12-25 NOTE — Telephone Encounter (Signed)
Telephone call from pt to report that he is having increased pain & swelling on right side mouth, denies any fever, bleeding, or drainage. He states the left side is unremarkable. He is eating and drinking, but with difficulty on right side d/t swelling  Pt did not come to his postop f/u appointment, but is requesting advice at this time. I instructed pt to try rinsing his mouth with warm salt water, and we will see him in office on 12/29/18 Pt agrees with plan of care  [facility]

## 2018-12-26 ENCOUNTER — Telehealth: Payer: Self-pay

## 2018-12-26 NOTE — Telephone Encounter (Signed)
Call from pt:  He states that he has increased pain & swelling on right side of his mouth which is affecting his ability to eat.  Denies any drainage, & no fever- he has attempted to rinse his mouth with salt water, but no relief He is requesting refill on pain meds  He has a f/u appointment with Dr. Ulice Bold 12/29/18  I will forward his request to Dr. Claudie Revering

## 2018-12-28 MED ORDER — KETOROLAC TROMETHAMINE 10 MG PO TABS: 10.0000 mg | ORAL_TABLET | Freq: Three times a day (TID) | ORAL | 0 refills | Status: AC | PRN

## 2018-12-29 ENCOUNTER — Ambulatory Visit: Payer: Self-pay | Admitting: Physician Assistant

## 2018-12-29 ENCOUNTER — Telehealth: Payer: Self-pay | Admitting: Plastic Surgery

## 2018-12-29 NOTE — Telephone Encounter (Signed)
Patient called and rescheduled appointment from 10am to 2:45pm due to a family emergency. Patient then called a second time inquiring about a possible later appointment. None are available today but offered patient appointment Monday afternoon or Tuesday. Patient stated that he felt as he needed to come in and stated he would attempt to make the 2:45 appointment. Advised patient to please call if anything were to change.

## 2019-01-01 ENCOUNTER — Ambulatory Visit (INDEPENDENT_AMBULATORY_CARE_PROVIDER_SITE_OTHER): Payer: Medicaid Other | Admitting: Plastic Surgery

## 2019-01-01 ENCOUNTER — Encounter: Payer: Self-pay | Admitting: Plastic Surgery

## 2019-01-01 DIAGNOSIS — S0240CD Maxillary fracture, right side, subsequent encounter for fracture with routine healing: Secondary | ICD-10-CM

## 2019-01-02 ENCOUNTER — Encounter: Payer: Self-pay | Admitting: Plastic Surgery

## 2019-01-02 DIAGNOSIS — S0292XA Unspecified fracture of facial bones, initial encounter for closed fracture: Secondary | ICD-10-CM | POA: Insufficient documentation

## 2019-01-02 NOTE — Progress Notes (Signed)
   Subjective:    Patient ID: Sean Mccoy, male    DOB: 1961/04/24, 58 y.o.   MRN: [id number]  The patient is a 58 year old male here for follow-up on his facial fracture.  He had a fall and sustained a right tripod fracture.  He was not displaced but complained of occultly opening and closing his mouth and malocclusion.  The maxillary fracture was repaired with open reduction internal fixation.  He has done extremely well.  He has a little bit of numbness and a little bit of tenderness at the right upper lip.  He no longer has malocclusion.  Not eating really hard but seems to be maintaining his weight. Swelling has improved. No sign of infection.  Good dental alignment noted today.     Review of Systems  Constitutional: Negative.   HENT: Negative.   Eyes: Negative.   Respiratory: Negative.   Gastrointestinal: Negative.   Genitourinary: Negative.   Musculoskeletal: Negative.   Skin: Negative.        Objective:   Physical Exam Vitals signs and nursing note reviewed.  Constitutional:      Appearance: Normal appearance.  HENT:     Head: Normocephalic.  Cardiovascular:     Rate and Rhythm: Normal rate.  Neurological:     Mental Status: He is alert.  Psychiatric:        Mood and Affect: Mood normal.        Thought Content: Thought content normal.        Judgment: Judgment normal.       Assessment & Plan:  Closed fracture of right side of maxilla with routine healing, subsequent encounter continue with softer diet for another 2 weeks.  No gum or hard candy yet.  Follow up as needed.

## 2019-04-09 ENCOUNTER — Ambulatory Visit: Payer: Self-pay | Admitting: Cardiology

## 2019-05-03 ENCOUNTER — Encounter: Payer: Self-pay | Admitting: Cardiology

## 2019-05-04 ENCOUNTER — Encounter: Payer: Self-pay | Admitting: Cardiology

## 2019-05-04 ENCOUNTER — Telehealth: Payer: Self-pay

## 2019-05-04 ENCOUNTER — Other Ambulatory Visit: Payer: Self-pay

## 2019-05-04 ENCOUNTER — Ambulatory Visit (INDEPENDENT_AMBULATORY_CARE_PROVIDER_SITE_OTHER): Payer: Medicaid Other | Admitting: Cardiology

## 2019-05-04 DIAGNOSIS — I10 Essential (primary) hypertension: Secondary | ICD-10-CM

## 2019-05-04 HISTORY — DX: Essential (primary) hypertension: I10

## 2019-05-04 NOTE — Progress Notes (Signed)
Patient requested to reschedule the appt. Not seen by me.

## 2019-11-07 ENCOUNTER — Encounter (HOSPITAL_COMMUNITY): Payer: Self-pay | Admitting: Emergency Medicine

## 2019-11-07 ENCOUNTER — Emergency Department (HOSPITAL_COMMUNITY): Payer: Medicaid Other

## 2019-11-07 ENCOUNTER — Emergency Department (HOSPITAL_COMMUNITY)
Admission: EM | Admit: 2019-11-07 | Discharge: 2019-11-08 | Disposition: A | Discharge status: Home / Self Care | Payer: Medicaid Other | Attending: Emergency Medicine | Admitting: Emergency Medicine

## 2019-11-07 ENCOUNTER — Other Ambulatory Visit: Payer: Self-pay

## 2019-11-07 DIAGNOSIS — W19XXXA Unspecified fall, initial encounter: Secondary | ICD-10-CM | POA: Insufficient documentation

## 2019-11-07 DIAGNOSIS — M25461 Effusion, right knee: Secondary | ICD-10-CM | POA: Diagnosis not present

## 2019-11-07 DIAGNOSIS — Z79899 Other long term (current) drug therapy: Secondary | ICD-10-CM | POA: Diagnosis not present

## 2019-11-07 DIAGNOSIS — M25561 Pain in right knee: Secondary | ICD-10-CM | POA: Insufficient documentation

## 2019-11-07 DIAGNOSIS — I10 Essential (primary) hypertension: Secondary | ICD-10-CM | POA: Diagnosis not present

## 2019-11-07 MED ORDER — OXYCODONE-ACETAMINOPHEN 5-325 MG PO TABS
1.0000 | ORAL_TABLET | ORAL | Status: DC | PRN
  Administered 2019-11-07: 1 via ORAL
  Filled 2019-11-07: qty 1

## 2019-11-07 NOTE — ED Notes (Signed)
Ice pack applied in triage. Pt taken to Xray.

## 2019-11-07 NOTE — ED Triage Notes (Signed)
Per ems, pt was walking to bus stop when he slipped and fell on his right knee. Pt reports he feels like it twisted and landed on it wrong. Pt has pain, swelling, and difficulty walking. Hx of surgery on that knee. Did not hit head, no other injuries. Not on blood thinners. Hx of htn.

## 2019-11-08 MED ORDER — NAPROXEN 500 MG PO TABS: 500.0000 mg | ORAL_TABLET | Freq: Two times a day (BID) | ORAL | 0 refills | Status: DC

## 2019-11-08 MED ORDER — OXYCODONE-ACETAMINOPHEN 5-325 MG PO TABS
1.0000 | ORAL_TABLET | Freq: Once | ORAL | Status: AC
  Administered 2019-11-08: 01:00:00 1 via ORAL
  Filled 2019-11-08: qty 1

## 2019-11-08 MED ORDER — ACETAMINOPHEN 500 MG PO TABS: 500.0000 mg | ORAL_TABLET | Freq: Four times a day (QID) | ORAL | 0 refills | Status: DC | PRN

## 2019-11-08 NOTE — ED Notes (Signed)
Patient verbalizes understanding of discharge instructions. Opportunity for questioning and answers were provided. Armband removed by staff, pt discharged from ED ambulatory. Crutches and knee sleeve provided. Crutch training provided. Pt demonstrated understanding.

## 2019-11-08 NOTE — Discharge Instructions (Addendum)
Take Naprosyn twice daily for your pain and inflammation.  Take Tylenol every 6 hours as needed for breakthrough pain.  Use ice 3-4 times daily alternating 20 minutes on, 20 minutes off.  Keep your leg elevated whenever you are not walking on it.  Use crutches to ambulate.  Please follow-up with the orthopedic doctor below for further evaluation and treatment of your knee injury.  Please return to the emergency department if you develop any new or worsening symptoms.

## 2019-11-08 NOTE — ED Provider Notes (Signed)
[facility] EMERGENCY DEPARTMENT Provider Note   CSN: [id number] Arrival date & time: 11/07/19  2116     History   Chief Complaint Chief Complaint  Patient presents with   Fall   Knee Pain    HPI Sean Mccoy is a 58 y.o. male with history of hypertension, RA, anxiety, depression, PTSD, AVN in the right leg who presents with right knee pain after [place]  He reports his knee went to an angle that it should not.  He has not been able to bear much weight since.  He has noticed pain worse with any movement.  He did not take any medication prior to arrival.  He denies any other injuries.  He did not hit his head or lose consciousness.     HPI  Past Medical History:  Diagnosis Date   Abrasion of face 11/06/2018   Anxiety    Depression    Enlarged prostate    HTN (hypertension) 05/04/2019   Orbital floor fracture (HCC) 11/06/2018   right   PTSD (post-traumatic stress disorder)    Rheumatoid arthritis (HCC)     Patient Active Problem List   Diagnosis Date Noted   HTN (hypertension) 05/04/2019   Facial fracture (HCC) 01/02/2019   SDH (subdural hematoma) (HCC) 11/07/2018   'light-for-dates' infant with signs of fetal malnutrition 03/20/2018   Polysubstance (including opioids) dependence w/o physiol dependence (HCC) 09/11/2016   Constipation due to pain medication therapy 02/17/2016   Postoperative anemia due to acute blood loss 02/17/2016   Osteoporosis 02/17/2016   Right knee pain 02/17/2016   Avascular necrosis of bone of right hip (HCC) 01/20/2016   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   Depression 11/29/2012   Anxiety 11/29/2012   Post traumatic stress disorder 11/29/2012   Substance induced mood disorder (HCC) 11/29/2012   Alcohol abuse 11/29/2012    Past Surgical History:  Procedure Laterality Date   KNEE ARTHROSCOPY Right    ORIF ORBITAL FRACTURE Right 11/22/2018   Procedure: OPEN REDUCTION INTERNAL FIXATION (ORIF) RIGHT TRIPOD FRACTURE;  Surgeon: Peggye Form, DO;  Location: [facility];  Service: Plastics;  Laterality: Right;   TOTAL HIP ARTHROPLASTY Right 01/20/2016   Procedure: RIGHT TOTAL HIP ARTHROPLASTY ANTERIOR APPROACH;  Surgeon: Kathryne Hitch, MD;  Location: [facility] OR;  Service: Orthopedics;  Laterality: Right;        Home Medications    Prior to Admission medications   Medication Sig Start Date End Date Taking? Authorizing Provider  acetaminophen (TYLENOL) 500 MG tablet Take 1 tablet (500 mg total) by mouth every 6 (six) hours as needed. 11/08/19   Desi Rowe, Waylan Boga, PA-C  naproxen (NAPROSYN) 500 MG tablet Take 1 tablet (500 mg total) by mouth 2 (two) times daily. 11/08/19   Gurvir Schrom, Waylan Boga, PA-C  oxyCODONE (OXY IR/ROXICODONE) 5 MG immediate release tablet Take 1-2 tablets (5-10 mg total) by mouth every 6 (six) hours as needed for moderate pain. 11/09/18   Barnetta Chapel, PA-C    Family History Family History  Problem Relation Age of Onset   Diabetes Mother    Hypertension Mother    Diabetes Father    Hypertension Father     Social History Social History   Tobacco Use   Smoking status: Never Smoker   Smokeless tobacco: Never Used  Substance Use Topics   Alcohol use: Yes    Comment: occasionally   Drug use: No  Allergies   Patient has no known allergies.   Review of Systems Review of Systems  Constitutional: Negative for fever.  Musculoskeletal: Positive for arthralgias and joint swelling.  Neurological: Negative for numbness.     Physical Exam Updated Vital Signs BP (!) 156/90 (BP Location: Right Arm)    Pulse (!) 103    Temp 98.7 F (37.1 C) (Oral)    Resp 18    Ht 5\' 10"  (1.778 m)    Wt 86.2 kg    SpO2 98%    BMI 27.26 kg/m   Physical Exam Vitals signs and nursing note reviewed.  Constitutional:      General: He is not in acute distress.    Appearance: He is  well-developed. He is not diaphoretic.  HENT:     Head: Normocephalic and atraumatic.     Mouth/Throat:     Pharynx: No oropharyngeal exudate.  Eyes:     General: No scleral icterus.       Right eye: No discharge.        Left eye: No discharge.     Conjunctiva/sclera: Conjunctivae normal.     Pupils: Pupils are equal, round, and reactive to light.  Neck:     Musculoskeletal: Normal range of motion and neck supple.     Thyroid: No thyromegaly.  Cardiovascular:     Rate and Rhythm: Normal rate and regular rhythm.     Heart sounds: Normal heart sounds. No murmur. No friction rub. No gallop.   Pulmonary:     Effort: Pulmonary effort is normal. No respiratory distress.     Breath sounds: Normal breath sounds. No stridor. No wheezing or rales.  Abdominal:     General: Bowel sounds are normal. There is no distension.     Palpations: Abdomen is soft.     Tenderness: There is no abdominal tenderness. There is no guarding or rebound.  Musculoskeletal:     Comments: R knee: Positive posterior drawer, negative anterior drawer; pain with McMurray's, mostly laterally, pain with varus and valgus stress also laterally; small effusion noted; range of motion intact, but pain exhibited  Lymphadenopathy:     Cervical: No cervical adenopathy.  Skin:    General: Skin is warm and dry.     Coloration: Skin is not pale.     Findings: No rash.  Neurological:     Mental Status: He is alert.     Coordination: Coordination normal.      ED Treatments / Results  Labs (all labs ordered are listed, but only abnormal results are displayed) Labs Reviewed - No data to display  EKG None  Radiology Dg Knee Complete 4 Views Right  Result Date: 11/07/2019 CLINICAL DATA:  Injury knee pain of EXAM: RIGHT KNEE - COMPLETE 4+ VIEW COMPARISON:  None. FINDINGS: No fracture or dislocation. There is diffuse osteopenia. There is a trace knee joint effusion. There appears to be area of sclerosis in the distal tibia  as on the prior, likely avascular necrosis. Again noted is cortical deformity of the proximal tibia, likely from prior injury. IMPRESSION: No acute osseous abnormality. Unchanged avascular necrosis in the distal tibia. Trace knee joint effusion. Electronically Signed   By: Prudencio Pair M.D.   On: 11/07/2019 22:19    Procedures Procedures (including critical care time)  Medications Ordered in ED Medications  oxyCODONE-acetaminophen (PERCOCET/ROXICET) 5-325 MG per tablet 1 tablet (1 tablet Oral Given 11/07/19 2134)  oxyCODONE-acetaminophen (PERCOCET/ROXICET) 5-325 MG per tablet 1 tablet (1 tablet Oral Given  11/08/19 0057)     Initial Impression / Assessment and Plan / ED Course  I have reviewed the triage vital signs and the nursing notes.  Pertinent labs & imaging results that were available during my care of the patient were reviewed by me and considered in my medical decision making (see chart for details).        Patient with twisting injury of right knee after he slipped and fell.  X-ray is negative for bony injury, but there is trace knee joint effusion.  Suspect soft tissue injury.  Patient placed in knee sleeve and given crutches.  Naprosyn and Tylenol discussed for home.  Ice and elevation also discussed.  Follow-up to orthopedics for further evaluation.  Patient understands and agrees with plan.  Patient vitals stable throughout ED course and discharged in satisfactory condition.  Final Clinical Impressions(s) / ED Diagnoses   Final diagnoses:  Fall, initial encounter  Acute pain of right knee    ED Discharge Orders         Ordered    naproxen (NAPROSYN) 500 MG tablet  2 times daily     11/08/19 0103    acetaminophen (TYLENOL) 500 MG tablet  Every 6 hours PRN     11/08/19 0103           Emi Holes, PA-C 11/08/19 0158    Mesner, Barbara Cower, MD 11/08/19 0600

## 2019-12-10 ENCOUNTER — Emergency Department (HOSPITAL_COMMUNITY): Payer: Medicaid Other

## 2019-12-10 ENCOUNTER — Other Ambulatory Visit: Payer: Self-pay

## 2019-12-10 ENCOUNTER — Encounter (HOSPITAL_COMMUNITY): Payer: Self-pay

## 2019-12-10 ENCOUNTER — Emergency Department (HOSPITAL_COMMUNITY)
Admission: EM | Admit: 2019-12-10 | Discharge: 2019-12-10 | Disposition: A | Discharge status: Home / Self Care | Payer: Medicaid Other | Attending: Emergency Medicine | Admitting: Emergency Medicine

## 2019-12-10 DIAGNOSIS — J168 Pneumonia due to other specified infectious organisms: Secondary | ICD-10-CM | POA: Insufficient documentation

## 2019-12-10 DIAGNOSIS — R05 Cough: Secondary | ICD-10-CM | POA: Diagnosis present

## 2019-12-10 DIAGNOSIS — Z96641 Presence of right artificial hip joint: Secondary | ICD-10-CM | POA: Insufficient documentation

## 2019-12-10 DIAGNOSIS — I10 Essential (primary) hypertension: Secondary | ICD-10-CM | POA: Diagnosis not present

## 2019-12-10 DIAGNOSIS — J1282 Pneumonia due to coronavirus disease 2019: Secondary | ICD-10-CM

## 2019-12-10 DIAGNOSIS — J1289 Other viral pneumonia: Secondary | ICD-10-CM

## 2019-12-10 DIAGNOSIS — U071 COVID-19: Secondary | ICD-10-CM | POA: Diagnosis not present

## 2019-12-10 LAB — COMPREHENSIVE METABOLIC PANEL
ALT: 75 U/L — ABNORMAL HIGH (ref 0–44)
AST: 181 U/L — ABNORMAL HIGH (ref 15–41)
Albumin: 3.5 g/dL (ref 3.5–5.0)
Alkaline Phosphatase: 140 U/L — ABNORMAL HIGH (ref 38–126)
Anion gap: 18 — ABNORMAL HIGH (ref 5–15)
BUN: <5 mg/dL — ABNORMAL LOW (ref 6–20)
CO2: 21 mmol/L — ABNORMAL LOW (ref 22–32)
Calcium: 8.3 mg/dL — ABNORMAL LOW (ref 8.9–10.3)
Chloride: 94 mmol/L — ABNORMAL LOW (ref 98–111)
Creatinine, Ser: 0.87 mg/dL (ref 0.61–1.24)
GFR calc Af Amer: >60 mL/min (ref >60)
GFR calc non Af Amer: >60 mL/min (ref >60)
Glucose, Bld: 105 mg/dL — ABNORMAL HIGH (ref 70–99)
Potassium: 4.2 mmol/L (ref 3.5–5.1)
Sodium: 133 mmol/L — ABNORMAL LOW (ref 135–145)
Total Bilirubin: 0.4 mg/dL (ref 0.3–1.2)
Total Protein: 8.2 g/dL — ABNORMAL HIGH (ref 6.5–8.1)

## 2019-12-10 LAB — URINALYSIS, ROUTINE W REFLEX MICROSCOPIC
Bilirubin Urine: NEGATIVE
Glucose, UA: NEGATIVE mg/dL
Hgb urine dipstick: NEGATIVE
Ketones, ur: 5 mg/dL — AB
Leukocytes,Ua: NEGATIVE
Nitrite: NEGATIVE
Protein, ur: NEGATIVE mg/dL
Specific Gravity, Urine: 1.003 — ABNORMAL LOW (ref 1.005–1.030)
pH: 5 (ref 5.0–8.0)

## 2019-12-10 LAB — CBC
HCT: 40.8 % (ref 39.0–52.0)
Hemoglobin: 13.9 g/dL (ref 13.0–17.0)
MCH: 32.2 pg (ref 26.0–34.0)
MCHC: 34.1 g/dL (ref 30.0–36.0)
MCV: 94.4 fL (ref 80.0–100.0)
Platelets: 142 10*3/uL — ABNORMAL LOW (ref 150–400)
RBC: 4.32 MIL/uL (ref 4.22–5.81)
RDW: 13.2 % (ref 11.5–15.5)
WBC: 4.9 10*3/uL (ref 4.0–10.5)
nRBC: 0 % (ref 0.0–0.2)

## 2019-12-10 LAB — LIPASE, BLOOD: Lipase: 31 U/L (ref 11–51)

## 2019-12-10 LAB — POC SARS CORONAVIRUS 2 AG -  ED: SARS Coronavirus 2 Ag: POSITIVE — AB

## 2019-12-10 MED ORDER — PREDNISONE 10 MG PO TABS: 40.0000 mg | ORAL_TABLET | Freq: Every day | ORAL | 0 refills | Status: DC

## 2019-12-10 MED ORDER — ACETAMINOPHEN 325 MG PO TABS
650.0000 mg | ORAL_TABLET | Freq: Once | ORAL | Status: AC
  Administered 2019-12-10: 650 mg via ORAL
  Filled 2019-12-10: qty 2

## 2019-12-10 MED ORDER — ZINC GLUCONATE 50 MG PO TABS: 50.0000 mg | ORAL_TABLET | Freq: Every day | ORAL | 0 refills | Status: AC

## 2019-12-10 MED ORDER — PREDNISONE 20 MG PO TABS
60.0000 mg | ORAL_TABLET | Freq: Once | ORAL | Status: AC
  Administered 2019-12-10: 60 mg via ORAL
  Filled 2019-12-10: qty 3

## 2019-12-10 MED ORDER — ACETAMINOPHEN 325 MG PO TABS
650.0000 mg | ORAL_TABLET | Freq: Once | ORAL | Status: AC | PRN
  Administered 2019-12-10: 650 mg via ORAL
  Filled 2019-12-10: qty 2

## 2019-12-10 MED ORDER — ONDANSETRON 4 MG PO TBDP
4.0000 mg | ORAL_TABLET | Freq: Once | ORAL | Status: AC
  Administered 2019-12-10: 21:00:00 4 mg via ORAL
  Filled 2019-12-10: qty 1

## 2019-12-10 MED ORDER — ONDANSETRON HCL 4 MG PO TABS: 4.0000 mg | ORAL_TABLET | Freq: Four times a day (QID) | ORAL | 0 refills | Status: DC

## 2019-12-10 NOTE — ED Triage Notes (Signed)
Pt from home with ems for flu like symptoms for 1 week. Cough, chills, gen body aches, n/v/d. No known COVID exposure. Loss sense of taste and loss of appetite. Ambulatory, no resp distress.

## 2019-12-10 NOTE — Discharge Instructions (Addendum)
Covid test was positive.  Chest x-ray shows some signs of early pneumonia secondary to the virus.  Take the Zofran as needed for nausea and vomiting.  Take the prednisone as directed for the next 5 days this will help the inflammation in the lungs.  In addition take the zinc as directed for the next 7 days.  Also recommend taking high-dose of vitamin D over-the-counter.  And also recommend taking a baby aspirin a day.  Return for any new or worse symptoms.  Important to self quarantine.  Work note provided to be out of work for the next 7 days.

## 2019-12-10 NOTE — ED Notes (Signed)
Patient Alert and oriented to baseline. Stable and ambulatory to baseline. Patient verbalized understanding of the discharge instructions.  Patient belongings were taken by the patient.   

## 2019-12-10 NOTE — ED Provider Notes (Signed)
[facility] EMERGENCY DEPARTMENT Provider Note   CSN: [id number] Arrival date & time: 12/10/19  1404     History No chief complaint on file.   Sean Mccoy is a 58 y.o. male.   Patient with flulike symptoms for about a week cough chills body aches some vomiting some diarrhea no blood no known Covid exposure but he has had losses taste and loss of appetite.  No significant shortness of breath.  Past medical history is significant for rheumatoid arthritis, patient not on hydroxychloroquine, posttraumatic stress disorder hypertension.  Patient's had symptoms he said initially for 7 days but it may have been 9 days.        Past Medical History:  Diagnosis Date  . Abrasion of face 11/06/2018  . Anxiety   . Depression   . Enlarged prostate   . HTN (hypertension) 05/04/2019  . Orbital floor fracture (HCC) 11/06/2018   right  . PTSD (post-traumatic stress disorder)   . Rheumatoid arthritis [facility])     Patient Active Problem List   Diagnosis Date Noted  . HTN (hypertension) 05/04/2019  . Facial fracture (HCC) 01/02/2019  . SDH (subdural hematoma) (HCC) 11/07/2018  . 'light-for-dates' infant with signs of fetal malnutrition 03/20/2018  . Polysubstance (including opioids) dependence w/o physiol dependence (HCC) 09/11/2016  . Constipation due to pain medication therapy 02/17/2016  . Postoperative anemia due to acute blood loss 02/17/2016  . Osteoporosis 02/17/2016  . Right knee pain 02/17/2016  . Avascular necrosis of bone of right hip (HCC) 01/20/2016  . 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 Depression 11/29/2012  . Anxiety 11/29/2012  . Post traumatic stress disorder 11/29/2012  . Substance induced mood disorder (HCC) 11/29/2012  . Alcohol abuse 11/29/2012    Past Surgical History:  Procedure Laterality Date  . KNEE ARTHROSCOPY Right   . ORIF ORBITAL FRACTURE Right 11/22/2018   Procedure: OPEN REDUCTION INTERNAL FIXATION (ORIF)  RIGHT TRIPOD FRACTURE;  Surgeon: Peggye Form, DO;  Location: [facility];  Service: Plastics;  Laterality: Right;  . TOTAL HIP ARTHROPLASTY Right 01/20/2016   Procedure: RIGHT TOTAL HIP ARTHROPLASTY ANTERIOR APPROACH;  Surgeon: Kathryne Hitch, MD;  Location: [facility] OR;  Service: Orthopedics;  Laterality: Right;       Family History  Problem Relation Age of Onset  . Diabetes Mother   . Hypertension Mother   . Diabetes Father   . Hypertension Father     Social History   Tobacco Use  . Smoking status: Never Smoker  . Smokeless tobacco: Never Used  Substance Use Topics  . Alcohol use: Yes    Comment: occasionally  . Drug use: No    Home Medications Prior to Admission medications   Medication Sig Start Date End Date Taking? Authorizing Provider  acetaminophen (TYLENOL) 500 MG tablet Take 1 tablet (500 mg total) by mouth every 6 (six) hours as needed. 11/08/19   Law, Waylan Boga, PA-C  naproxen (NAPROSYN) 500 MG tablet Take 1 tablet (500 mg total) by mouth 2 (two) times daily. 11/08/19   Law, Waylan Boga, PA-C  ondansetron (ZOFRAN) 4 MG tablet Take 1 tablet (4 mg total) by mouth every 6 (six) hours. 12/10/19   Vanetta Mulders, MD  oxyCODONE (OXY IR/ROXICODONE) 5 MG immediate release tablet Take 1-2 tablets (5-10 mg total) by mouth every 6 (six) hours as needed for moderate pain. 11/09/18   Barnetta Chapel, PA-C  predniSONE (DELTASONE) 10 MG tablet Take 4 tablets (40 mg  total) by mouth daily. 12/10/19   Vanetta Mulders, MD  zinc gluconate 50 MG tablet Take 1 tablet (50 mg total) by mouth daily for 7 days. 12/10/19 12/17/19  Vanetta Mulders, MD    Allergies    Patient has no known allergies.  Review of Systems   Review of Systems  Constitutional: Negative for chills and fever.  HENT: Negative for congestion, rhinorrhea and sore throat.   Eyes: Negative for visual disturbance.  Respiratory: Positive for cough. Negative for shortness of breath.     Cardiovascular: Negative for chest pain and leg swelling.  Gastrointestinal: Positive for diarrhea, nausea and vomiting. Negative for abdominal pain.  Genitourinary: Negative for dysuria.  Musculoskeletal: Positive for myalgias. Negative for back pain and neck pain.  Skin: Negative for rash.  Neurological: Positive for headaches. Negative for dizziness and light-headedness.  Hematological: Does not bruise/bleed easily.  Psychiatric/Behavioral: Negative for confusion.    Physical Exam Updated Vital Signs BP (!) 162/87 (BP Location: Right Arm)   Pulse 97   Temp 98.8 F (37.1 C) (Oral)   Resp 19   SpO2 97%   Physical Exam Vitals and nursing note reviewed.  Constitutional:      General: He is not in acute distress.    Appearance: Normal appearance. He is well-developed.  HENT:     Head: Normocephalic and atraumatic.  Eyes:     Extraocular Movements: Extraocular movements intact.     Conjunctiva/sclera: Conjunctivae normal.     Pupils: Pupils are equal, round, and reactive to light.  Cardiovascular:     Rate and Rhythm: Regular rhythm. Tachycardia present.     Heart sounds: No murmur.  Pulmonary:     Effort: Pulmonary effort is normal. No respiratory distress.     Breath sounds: Normal breath sounds.  Abdominal:     Palpations: Abdomen is soft.     Tenderness: There is no abdominal tenderness.  Musculoskeletal:        General: No swelling. Normal range of motion.     Cervical back: Normal range of motion and neck supple.  Skin:    General: Skin is warm and dry.     Capillary Refill: Capillary refill takes less than 2 seconds.  Neurological:     General: No focal deficit present.     Mental Status: He is alert and oriented to person, place, and time.     Comments: Except loss of taste.     ED Results / Procedures / Treatments   Labs (all labs ordered are listed, but only abnormal results are displayed) Labs Reviewed  COMPREHENSIVE METABOLIC PANEL - Abnormal;  Notable for the following components:      Result Value   Sodium 133 (*)    Chloride 94 (*)    CO2 21 (*)    Glucose, Bld 105 (*)    BUN <5 (*)    Calcium 8.3 (*)    Total Protein 8.2 (*)    AST 181 (*)    ALT 75 (*)    Alkaline Phosphatase 140 (*)    Anion gap 18 (*)    All other components within normal limits  CBC - Abnormal; Notable for the following components:   Platelets 142 (*)    All other components within normal limits  URINALYSIS, ROUTINE W REFLEX MICROSCOPIC - Abnormal; Notable for the following components:   Color, Urine STRAW (*)    Specific Gravity, Urine 1.003 (*)    Ketones, ur 5 (*)    All  other components within normal limits  POC SARS CORONAVIRUS 2 AG -  ED - Abnormal; Notable for the following components:   SARS Coronavirus 2 Ag POSITIVE (*)    All other components within normal limits  LIPASE, BLOOD    EKG None  Radiology DG Chest Port 1 View  Result Date: 12/10/2019 CLINICAL DATA:  Flu-like symptoms.  Cough, chills EXAM: PORTABLE CHEST 1 VIEW COMPARISON:  05/19/2018 FINDINGS: Patchy vague airspace opacities noted in the lungs bilaterally. Heart is borderline in size. No effusions or pneumothorax. No acute bony abnormality. IMPRESSION: Patchy bilateral airspace opacities concerning for pneumonia. COVID pneumonia could have this appearance. Electronically Signed   By: Rolm Baptise M.D.   On: 12/10/2019 18:53    Procedures Procedures (including critical care time)  Medications Ordered in ED Medications  predniSONE (DELTASONE) tablet 60 mg (has no administration in time range)  ondansetron (ZOFRAN-ODT) disintegrating tablet 4 mg (has no administration in time range)  acetaminophen (TYLENOL) tablet 650 mg (650 mg Oral Given 12/10/19 1427)  acetaminophen (TYLENOL) tablet 650 mg (650 mg Oral Given 12/10/19 2022)    ED Course  I have reviewed the triage vital signs and the nursing notes.  Pertinent labs & imaging results that were available during  my care of the patient were reviewed by me and considered in my medical decision making (see chart for details).    MDM Rules/Calculators/A&P                     Patient slightly tachycardic but not hypoxic room air sats 98% not tachypneic.  Temp 99.  Point-of-care Covid testing positive chest x-ray suggestive of early bilateral patchy infiltrates.  Suggestive of COVID-19 pneumonia.  Patient stable for discharge home.  Will be treated symptomatically.  Will be started on baby aspirin a day zinc because of the patchy infiltrates we started on prednisone for 5 days.  Also Zofran for the nausea.  And Tylenol as needed.  Patient also given a work note to be out of work for the next 7 days.  Patient will self isolate.  Patient will return for any new or worse symptoms.  Patient currently stable for discharge home.  Final Clinical Impression(s) / ED Diagnoses Final diagnoses:  Pneumonia due to COVID-19 virus    Rx / DC Orders ED Discharge Orders         Ordered    predniSONE (DELTASONE) 10 MG tablet  Daily     12/10/19 2025    zinc gluconate 50 MG tablet  Daily     12/10/19 2025    ondansetron (ZOFRAN) 4 MG tablet  Every 6 hours     12/10/19 2027           Fredia Sorrow, MD 12/10/19 2052

## 2019-12-10 NOTE — ED Notes (Signed)
Notified MD pt requesting something for pain

## 2020-02-07 ENCOUNTER — Emergency Department (HOSPITAL_COMMUNITY): Payer: Medicaid Other

## 2020-02-07 ENCOUNTER — Emergency Department (HOSPITAL_COMMUNITY)
Admission: EM | Admit: 2020-02-07 | Discharge: 2020-02-07 | Disposition: A | Discharge status: Home / Self Care | Payer: Medicaid Other | Attending: Emergency Medicine | Admitting: Emergency Medicine

## 2020-02-07 ENCOUNTER — Encounter (HOSPITAL_COMMUNITY): Payer: Self-pay

## 2020-02-07 DIAGNOSIS — R0602 Shortness of breath: Secondary | ICD-10-CM | POA: Insufficient documentation

## 2020-02-07 DIAGNOSIS — I10 Essential (primary) hypertension: Secondary | ICD-10-CM | POA: Insufficient documentation

## 2020-02-07 DIAGNOSIS — R1084 Generalized abdominal pain: Secondary | ICD-10-CM | POA: Insufficient documentation

## 2020-02-07 DIAGNOSIS — M069 Rheumatoid arthritis, unspecified: Secondary | ICD-10-CM | POA: Diagnosis not present

## 2020-02-07 DIAGNOSIS — R112 Nausea with vomiting, unspecified: Secondary | ICD-10-CM | POA: Insufficient documentation

## 2020-02-07 DIAGNOSIS — Z8616 Personal history of COVID-19: Secondary | ICD-10-CM | POA: Insufficient documentation

## 2020-02-07 DIAGNOSIS — R5383 Other fatigue: Secondary | ICD-10-CM | POA: Diagnosis not present

## 2020-02-07 LAB — BASIC METABOLIC PANEL
Anion gap: 17 — ABNORMAL HIGH (ref 5–15)
BUN: 8 mg/dL (ref 6–20)
CO2: 20 mmol/L — ABNORMAL LOW (ref 22–32)
Calcium: 9.2 mg/dL (ref 8.9–10.3)
Chloride: 98 mmol/L (ref 98–111)
Creatinine, Ser: 0.73 mg/dL (ref 0.61–1.24)
GFR calc Af Amer: >60 mL/min (ref >60)
GFR calc non Af Amer: >60 mL/min (ref >60)
Glucose, Bld: 118 mg/dL — ABNORMAL HIGH (ref 70–99)
Potassium: 4.3 mmol/L (ref 3.5–5.1)
Sodium: 135 mmol/L (ref 135–145)

## 2020-02-07 LAB — HEPATIC FUNCTION PANEL
ALT: 38 U/L (ref 0–44)
AST: 117 U/L — ABNORMAL HIGH (ref 15–41)
Albumin: 4 g/dL (ref 3.5–5.0)
Alkaline Phosphatase: 81 U/L (ref 38–126)
Bilirubin, Direct: 0.4 mg/dL — ABNORMAL HIGH (ref 0.0–0.2)
Indirect Bilirubin: 1 mg/dL — ABNORMAL HIGH (ref 0.3–0.9)
Total Bilirubin: 1.4 mg/dL — ABNORMAL HIGH (ref 0.3–1.2)
Total Protein: 7.8 g/dL (ref 6.5–8.1)

## 2020-02-07 LAB — CBC
HCT: 37.3 % — ABNORMAL LOW (ref 39.0–52.0)
Hemoglobin: 12.6 g/dL — ABNORMAL LOW (ref 13.0–17.0)
MCH: 32.3 pg (ref 26.0–34.0)
MCHC: 33.8 g/dL (ref 30.0–36.0)
MCV: 95.6 fL (ref 80.0–100.0)
Platelets: 230 10*3/uL (ref 150–400)
RBC: 3.9 MIL/uL — ABNORMAL LOW (ref 4.22–5.81)
RDW: 15.6 % — ABNORMAL HIGH (ref 11.5–15.5)
WBC: 4 10*3/uL (ref 4.0–10.5)
nRBC: 0 % (ref 0.0–0.2)

## 2020-02-07 LAB — URINALYSIS, ROUTINE W REFLEX MICROSCOPIC
Bilirubin Urine: NEGATIVE
Glucose, UA: NEGATIVE mg/dL
Hgb urine dipstick: NEGATIVE
Ketones, ur: NEGATIVE mg/dL
Leukocytes,Ua: NEGATIVE
Nitrite: NEGATIVE
Protein, ur: NEGATIVE mg/dL
Specific Gravity, Urine: 1.003 — ABNORMAL LOW (ref 1.005–1.030)
pH: 5 (ref 5.0–8.0)

## 2020-02-07 LAB — CBG MONITORING, ED: Glucose-Capillary: 109 mg/dL — ABNORMAL HIGH (ref 70–99)

## 2020-02-07 LAB — TROPONIN I (HIGH SENSITIVITY)
Troponin I (High Sensitivity): 12 ng/L (ref <18)
Troponin I (High Sensitivity): 10 ng/L (ref <18)

## 2020-02-07 LAB — LIPASE, BLOOD: Lipase: 24 U/L (ref 11–51)

## 2020-02-07 LAB — D-DIMER, QUANTITATIVE: D-Dimer, Quant: <0.27 ug/mL-FEU (ref 0.00–0.50)

## 2020-02-07 MED ORDER — SODIUM CHLORIDE 0.9 % IV BOLUS
1000.0000 mL | Freq: Once | INTRAVENOUS | Status: AC
  Administered 2020-02-07: 1000 mL via INTRAVENOUS

## 2020-02-07 MED ORDER — KETOROLAC TROMETHAMINE 15 MG/ML IJ SOLN
15.0000 mg | Freq: Once | INTRAMUSCULAR | Status: AC
  Administered 2020-02-07: 15 mg via INTRAVENOUS
  Filled 2020-02-07: qty 1

## 2020-02-07 MED ORDER — MORPHINE SULFATE (PF) 2 MG/ML IV SOLN
2.0000 mg | Freq: Once | INTRAVENOUS | Status: AC
  Administered 2020-02-07: 2 mg via INTRAVENOUS
  Filled 2020-02-07: qty 1

## 2020-02-07 NOTE — ED Notes (Addendum)
Pt ambulated with pulse ox and was between 95-97% on RA. Pt stated that he did feel SOB with long and short distances.

## 2020-02-07 NOTE — ED Provider Notes (Signed)
Medical Decision Making: Care of patient assumed from [facility] PA-C at 1600.  Agree with history, physical exam and plan.  See their note for further details.  Briefly, 59 y.o. male with PMH/PSH as below.  Past Medical History:  Diagnosis Date  . Abrasion of face 11/06/2018  . Anxiety   . Depression   . Enlarged prostate   . HTN (hypertension) 05/04/2019  . Orbital floor fracture (HCC) 11/06/2018   right  . PTSD (post-traumatic stress disorder)   . Rheumatoid arthritis [facility])    Past Surgical History:  Procedure Laterality Date  . KNEE ARTHROSCOPY Right   . ORIF ORBITAL FRACTURE Right 11/22/2018   Procedure: OPEN REDUCTION INTERNAL FIXATION (ORIF) RIGHT TRIPOD FRACTURE;  Surgeon: Peggye Form, DO;  Location: [facility];  Service: Plastics;  Laterality: Right;  . TOTAL HIP ARTHROPLASTY Right 01/20/2016   Procedure: RIGHT TOTAL HIP ARTHROPLASTY ANTERIOR APPROACH;  Surgeon: Kathryne Hitch, MD;  Location: [facility] OR;  Service: Orthopedics;  Laterality: Right;     Patient HDS at handoff.  Plan at time of handoff:  Needs 2nd troponin   ED Course Second troponin negative and downtrending.  Rechecked pt who is resting comfortably.  Strict return precautions given, will follow up with PCP.  Discharged in stable condition.    Clinical Impression 1. SOB (shortness of breath)       Patient care provided under supervision of my attending, Dr. Stevie Kern.    Takako Minckler, [place], MD 02/08/20 Rich Fuchs    Milagros Loll, MD 02/08/20 [phone]

## 2020-02-07 NOTE — ED Provider Notes (Signed)
[facility] EMERGENCY DEPARTMENT Provider Note   CSN: [id number] Arrival date & time: 02/07/20  1211     History Chief Complaint  Patient presents with  . Shortness of Breath  . Covid+    Sean Mccoy is a 59 y.o. male history of rheumatoid arthritis, hypertension, alcohol abuse, polysubstance abuse, anxiety/depression.  Patient presents today for shortness of breath, fatigue, nausea/vomiting ongoing for the past 2 months.  Patient reports that he was diagnosed with COVID-19 viral infection in mid December 2020.  He reports that his symptoms gradually improved after this diagnosis but feels they have returned and worsened over the past 1-2 weeks.  He reports shortness of breath with any exertion a feeling of difficulty catching his breath that gradually improves with rest, not associated with chest pain. He reports nausea and nonbloody/nonbilious emesis that is intermittent over the past 2 weeks.  He reports a generalized abdominal pain, diffuse aching sharp constant worsened with eating and vomiting no clear alleviating factors, he reports pain will occasionally radiate up into his throat and feels this is similar to his "gastritis".  He denies any recurrence of fever/chills, denies headache, sore throat, neck pain, chest pain, swelling/pain of the extremities, color change, rash, dysuria/hematuria, diarrhea or any additional concerns. HPI     Past Medical History:  Diagnosis Date  . Abrasion of face 11/06/2018  . Anxiety   . Depression   . Enlarged prostate   . HTN (hypertension) 05/04/2019  . Orbital floor fracture (HCC) 11/06/2018   right  . PTSD (post-traumatic stress disorder)   . Rheumatoid arthritis [facility])     Patient Active Problem List   Diagnosis Date Noted  . HTN (hypertension) 05/04/2019  . Facial fracture (HCC) 01/02/2019  . SDH (subdural hematoma) (HCC) 11/07/2018  . 'light-for-dates' infant with signs of fetal malnutrition 03/20/2018  .  Polysubstance (including opioids) dependence w/o physiol dependence (HCC) 09/11/2016  . Constipation due to pain medication therapy 02/17/2016  . Postoperative anemia due to acute blood loss 02/17/2016  . Osteoporosis 02/17/2016  . Right knee pain 02/17/2016  . Avascular necrosis of bone of right hip (HCC) 01/20/2016  . 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 Depression 11/29/2012  . Anxiety 11/29/2012  . Post traumatic stress disorder 11/29/2012  . Substance induced mood disorder (HCC) 11/29/2012  . Alcohol abuse 11/29/2012    Past Surgical History:  Procedure Laterality Date  . KNEE ARTHROSCOPY Right   . ORIF ORBITAL FRACTURE Right 11/22/2018   Procedure: OPEN REDUCTION INTERNAL FIXATION (ORIF) RIGHT TRIPOD FRACTURE;  Surgeon: Peggye Form, DO;  Location: [facility];  Service: Plastics;  Laterality: Right;  . TOTAL HIP ARTHROPLASTY Right 01/20/2016   Procedure: RIGHT TOTAL HIP ARTHROPLASTY ANTERIOR APPROACH;  Surgeon: Kathryne Hitch, MD;  Location: [facility] OR;  Service: Orthopedics;  Laterality: Right;       Family History  Problem Relation Age of Onset  . Diabetes Mother   . Hypertension Mother   . Diabetes Father   . Hypertension Father     Social History   Tobacco Use  . Smoking status: Never Smoker  . Smokeless tobacco: Never Used  Substance Use Topics  . Alcohol use: Yes    Comment: occasionally  . Drug use: No    Home Medications Prior to Admission medications   Medication Sig Start Date End Date Taking? Authorizing Provider  acetaminophen (TYLENOL) 500 MG tablet Take 1 tablet (500 mg total) by mouth every 6 (  six) hours as needed. Patient not taking: Reported on 02/07/2020 11/08/19   Emi Holes, PA-C  naproxen (NAPROSYN) 500 MG tablet Take 1 tablet (500 mg total) by mouth 2 (two) times daily. Patient not taking: Reported on 02/07/2020 11/08/19   Emi Holes, PA-C  ondansetron (ZOFRAN) 4 MG tablet Take 1  tablet (4 mg total) by mouth every 6 (six) hours. Patient not taking: Reported on 02/07/2020 12/10/19   Vanetta Mulders, MD  oxyCODONE (OXY IR/ROXICODONE) 5 MG immediate release tablet Take 1-2 tablets (5-10 mg total) by mouth every 6 (six) hours as needed for moderate pain. Patient not taking: Reported on 02/07/2020 11/09/18   Barnetta Chapel, PA-C  predniSONE (DELTASONE) 10 MG tablet Take 4 tablets (40 mg total) by mouth daily. Patient not taking: Reported on 02/07/2020 12/10/19   Vanetta Mulders, MD    Allergies    Patient has no known allergies.  Review of Systems   Review of Systems Ten systems are reviewed and are negative for acute change except as noted in the HPI  Physical Exam Updated Vital Signs BP (!) 180/106 (BP Location: Left Arm)   Pulse (!) 112   Temp 98.7 F (37.1 C) (Oral)   Resp 18   Ht 5\' 10"  (1.778 m)   Wt 81.6 kg   SpO2 100%   BMI 25.83 kg/m   Physical Exam Constitutional:      General: He is not in acute distress.    Appearance: Normal appearance. He is well-developed. He is not ill-appearing or diaphoretic.  HENT:     Head: Normocephalic and atraumatic.     Right Ear: External ear normal.     Left Ear: External ear normal.     Nose: Nose normal.  Eyes:     General: Vision grossly intact. Gaze aligned appropriately.     Pupils: Pupils are equal, round, and reactive to light.  Neck:     Trachea: Trachea and phonation normal. No tracheal deviation.  Cardiovascular:     Rate and Rhythm: Normal rate and regular rhythm.     Pulses:          Radial pulses are 2+ on the right side and 2+ on the left side.       Dorsalis pedis pulses are 2+ on the right side and 2+ on the left side.  Pulmonary:     Effort: Pulmonary effort is normal. No accessory muscle usage or respiratory distress.     Breath sounds: Normal breath sounds.  Abdominal:     General: There is no distension.     Palpations: Abdomen is soft.     Tenderness: There is generalized abdominal  tenderness. There is no guarding or rebound.  Musculoskeletal:        General: Normal range of motion.     Cervical back: Normal range of motion.     Right lower leg: No tenderness. No edema.     Left lower leg: No tenderness. No edema.  Skin:    General: Skin is warm and dry.  Neurological:     Mental Status: He is alert.     GCS: GCS eye subscore is 4. GCS verbal subscore is 5. GCS motor subscore is 6.     Comments: Speech is clear and goal oriented, follows commands Major Cranial nerves without deficit, no facial droop Moves extremities without ataxia, coordination intact  Psychiatric:        Behavior: Behavior normal.     ED Results / Procedures /  Treatments   Labs (all labs ordered are listed, but only abnormal results are displayed) Labs Reviewed  BASIC METABOLIC PANEL - Abnormal; Notable for the following components:      Result Value   CO2 20 (*)    Glucose, Bld 118 (*)    Anion gap 17 (*)    All other components within normal limits  CBC - Abnormal; Notable for the following components:   RBC 3.90 (*)    Hemoglobin 12.6 (*)    HCT 37.3 (*)    RDW 15.6 (*)    All other components within normal limits  HEPATIC FUNCTION PANEL - Abnormal; Notable for the following components:   AST 117 (*)    Total Bilirubin 1.4 (*)    Bilirubin, Direct 0.4 (*)    Indirect Bilirubin 1.0 (*)    All other components within normal limits  CBG MONITORING, ED - Abnormal; Notable for the following components:   Glucose-Capillary 109 (*)    All other components within normal limits  D-DIMER, QUANTITATIVE (NOT AT [facility])  LIPASE, BLOOD  URINALYSIS, ROUTINE W REFLEX MICROSCOPIC  TROPONIN I (HIGH SENSITIVITY)  TROPONIN I (HIGH SENSITIVITY)    EKG None  Radiology DG Chest Port 1 View  Result Date: 02/07/2020 CLINICAL DATA:  Pt to ED for ongoing COVID symtpoms, states he tested positive on 12/14 and was given a prescription and started to feel better. Pt states x 1 week ago symptoms  returned, mostly CP and body aches. Pt hx HTN. Nonsmoker EXAM: PORTABLE CHEST 1 VIEW COMPARISON:  Chest radiograph 12/10/2019 FINDINGS: The heart size and mediastinal contours are within normal limits. The lungs are clear. No pneumothorax or large pleural effusion. The visualized skeletal structures are unremarkable. IMPRESSION: No evidence of active disease. Electronically Signed   By: Audie Pinto M.D.   On: 02/07/2020 13:39    Procedures Procedures (including critical care time)  Medications Ordered in ED Medications  sodium chloride 0.9 % bolus 1,000 mL (1,000 mLs Intravenous New Bag/Given 02/07/20 1337)  morphine 2 MG/ML injection 2 mg (2 mg Intravenous Given 02/07/20 1338)    ED Course  I have reviewed the triage vital signs and the nursing notes.  Pertinent labs & imaging results that were available during my care of the patient were reviewed by me and considered in my medical decision making (see chart for details).    MDM Rules/Calculators/A&P                     59 year old male presents today for multiple concerns including shortness of breath with exertion, abdominal pain, nausea/vomiting, fatigue ongoing for the last 2 weeks, reports feels very similar to previous COVID-19 infection in December but reports that those symptoms had originally improved but never fully resolved.  On arrival he is tired appearing no acute distress, cranial nerves intact, no meningeal signs, heart mildly tachycardic regular rhythm, lungs clear to auscultation bilaterally, abdomen generally tender without focal tenderness or peritoneal signs, neurovascular tact to all 4 extremities without evidence of DVT.  Will give patient pain control and fluid, obtain shortness of breath work-up and reassess. - I personally reviewed chest x-ray and agree with radiologist interpretation of no active disease.  CBC reviewed, hemoglobin 12.6, similar to prior, no evidence of infection or symptomatic anemia BMP shows  anion gap 17, mildly elevated glucose and CO2 of 20, patient is nondiabetic, doubt DKA High-sensitivity troponin within normal limits LFTs with mild elevation of AST at 117 D-dimer negative,  doubt pulmonary embolism Lipase within normal limits, no evidence of pancreatitis CBG 109 Urinalysis, delta troponin and EKG pending - Lab results above reviewed, appears patient dehydrated today may account for his tachycardia, mildly elevated anion gap.  I doubt DKA or infectious process at this time.  His abdomen is soft nontender and without peritoneal signs, suspect mild elevation of AST today to be secondary to his EtOH use.  Will give another fluid bolus  3 PM: Patient reassessed sitting bed comfortably no acute distress reports that he is feeling "a lot better" after receiving fluid bolus and 2 mg of morphine. - EKG and delta troponin pending. Care handoff given to Dr. Stevie Kern.  Plan of care follow EKG and second troponin, anticipate discharge.  Disposition per oncoming team.  Sean Mccoy was evaluated in Emergency Department on 02/07/2020 for the symptoms described in the history of present illness. He was evaluated in the context of the global COVID-19 pandemic, which necessitated consideration that the patient might be at risk for infection with the SARS-CoV-2 virus that causes COVID-19. Institutional protocols and algorithms that pertain to the evaluation of patients at risk for COVID-19 are in a state of rapid change based on information released by regulatory bodies including the CDC and federal and state organizations. These policies and algorithms were followed during the patient's care in the ED.  Note: Portions of this report may have been transcribed using voice recognition software. Every effort was made to ensure accuracy; however, inadvertent computerized transcription errors may still be present. Final Clinical Impression(s) / ED Diagnoses Final diagnoses:  None    Rx / DC Orders ED  Discharge Orders    None       Elizabeth Palau 02/07/20 1558    Jacalyn Lefevre, MD 02/14/20 1714

## 2020-02-07 NOTE — ED Triage Notes (Signed)
Pt arrives POV for eval of ongoing covid sx. Pt reports he was dx'd 1 month ago and states that he was told to return if not feeling better. States the sx have persisted and he has ongoing SOB, N/V, fatigue and malaise. States ongoing cough.

## 2020-02-07 NOTE — ED Notes (Signed)
Patient verbalizes understanding of discharge instructions. Opportunity for questioning and answers were provided. Armband removed by staff, pt discharged from ED in wheelchair to home.   

## 2020-02-07 NOTE — ED Notes (Signed)
Patient states he tested positive for covid 12/14 states he was given prednisone he felt better , states he started feeling bad again 1 week ago, decreased appetite x 1 day. C/o generalized bodyaches

## 2020-02-07 NOTE — ED Notes (Signed)
Pt requested Provider look over his past history to see if he could be prescribed something for "chest gurgling". IV has been removed and pt is getting dressed.

## 2020-03-29 ENCOUNTER — Emergency Department (HOSPITAL_COMMUNITY)
Admission: EM | Admit: 2020-03-29 | Discharge: 2020-03-29 | Disposition: A | Discharge status: Home / Self Care | Payer: Medicaid Other | Attending: Emergency Medicine | Admitting: Emergency Medicine

## 2020-03-29 ENCOUNTER — Emergency Department (HOSPITAL_COMMUNITY): Payer: Medicaid Other

## 2020-03-29 ENCOUNTER — Encounter (HOSPITAL_COMMUNITY): Payer: Self-pay | Admitting: Emergency Medicine

## 2020-03-29 ENCOUNTER — Other Ambulatory Visit: Payer: Self-pay

## 2020-03-29 DIAGNOSIS — Z96641 Presence of right artificial hip joint: Secondary | ICD-10-CM | POA: Insufficient documentation

## 2020-03-29 DIAGNOSIS — Z8616 Personal history of COVID-19: Secondary | ICD-10-CM | POA: Diagnosis not present

## 2020-03-29 DIAGNOSIS — R112 Nausea with vomiting, unspecified: Secondary | ICD-10-CM | POA: Diagnosis not present

## 2020-03-29 DIAGNOSIS — Z79899 Other long term (current) drug therapy: Secondary | ICD-10-CM | POA: Insufficient documentation

## 2020-03-29 DIAGNOSIS — R109 Unspecified abdominal pain: Secondary | ICD-10-CM | POA: Diagnosis present

## 2020-03-29 DIAGNOSIS — K769 Liver disease, unspecified: Secondary | ICD-10-CM

## 2020-03-29 DIAGNOSIS — I10 Essential (primary) hypertension: Secondary | ICD-10-CM | POA: Diagnosis not present

## 2020-03-29 HISTORY — DX: COVID-19: U07.1

## 2020-03-29 LAB — URINALYSIS, ROUTINE W REFLEX MICROSCOPIC
Bilirubin Urine: NEGATIVE
Glucose, UA: NEGATIVE mg/dL
Hgb urine dipstick: NEGATIVE
Ketones, ur: NEGATIVE mg/dL
Leukocytes,Ua: NEGATIVE
Nitrite: NEGATIVE
Protein, ur: NEGATIVE mg/dL
Specific Gravity, Urine: 1.012 (ref 1.005–1.030)
pH: 5 (ref 5.0–8.0)

## 2020-03-29 LAB — COMPREHENSIVE METABOLIC PANEL
ALT: 73 U/L — ABNORMAL HIGH (ref 0–44)
AST: 225 U/L — ABNORMAL HIGH (ref 15–41)
Albumin: 3.7 g/dL (ref 3.5–5.0)
Alkaline Phosphatase: 178 U/L — ABNORMAL HIGH (ref 38–126)
Anion gap: 19 — ABNORMAL HIGH (ref 5–15)
BUN: 5 mg/dL — ABNORMAL LOW (ref 6–20)
CO2: 23 mmol/L (ref 22–32)
Calcium: 8.7 mg/dL — ABNORMAL LOW (ref 8.9–10.3)
Chloride: 91 mmol/L — ABNORMAL LOW (ref 98–111)
Creatinine, Ser: 0.82 mg/dL (ref 0.61–1.24)
GFR calc Af Amer: >60 mL/min (ref >60)
GFR calc non Af Amer: >60 mL/min (ref >60)
Glucose, Bld: 103 mg/dL — ABNORMAL HIGH (ref 70–99)
Potassium: 3.7 mmol/L (ref 3.5–5.1)
Sodium: 133 mmol/L — ABNORMAL LOW (ref 135–145)
Total Bilirubin: 3.7 mg/dL — ABNORMAL HIGH (ref 0.3–1.2)
Total Protein: 7.7 g/dL (ref 6.5–8.1)

## 2020-03-29 LAB — CBC
HCT: 31.9 % — ABNORMAL LOW (ref 39.0–52.0)
Hemoglobin: 10.1 g/dL — ABNORMAL LOW (ref 13.0–17.0)
MCH: 32.3 pg (ref 26.0–34.0)
MCHC: 31.7 g/dL (ref 30.0–36.0)
MCV: 101.9 fL — ABNORMAL HIGH (ref 80.0–100.0)
Platelets: 131 10*3/uL — ABNORMAL LOW (ref 150–400)
RBC: 3.13 MIL/uL — ABNORMAL LOW (ref 4.22–5.81)
RDW: 17.5 % — ABNORMAL HIGH (ref 11.5–15.5)
WBC: 7.2 10*3/uL (ref 4.0–10.5)
nRBC: 0.4 % — ABNORMAL HIGH (ref 0.0–0.2)

## 2020-03-29 LAB — HEPATITIS PANEL, ACUTE
HCV Ab: NONREACTIVE
Hep A IgM: NONREACTIVE
Hep B C IgM: NONREACTIVE
Hepatitis B Surface Ag: NONREACTIVE

## 2020-03-29 LAB — LIPASE, BLOOD: Lipase: 28 U/L (ref 11–51)

## 2020-03-29 MED ORDER — SODIUM CHLORIDE 0.9 % IV BOLUS
1000.0000 mL | Freq: Once | INTRAVENOUS | Status: AC
  Administered 2020-03-29: 12:00:00 1000 mL via INTRAVENOUS

## 2020-03-29 MED ORDER — IOHEXOL 300 MG/ML  SOLN
100.0000 mL | Freq: Once | INTRAMUSCULAR | Status: AC | PRN
  Administered 2020-03-29: 13:00:00 100 mL via INTRAVENOUS

## 2020-03-29 MED ORDER — DICYCLOMINE HCL 20 MG PO TABS: 20.0000 mg | ORAL_TABLET | Freq: Two times a day (BID) | ORAL | 0 refills | Status: DC

## 2020-03-29 MED ORDER — SODIUM CHLORIDE 0.9% FLUSH
3.0000 mL | Freq: Once | INTRAVENOUS | Status: AC
  Administered 2020-03-29: 11:00:00 3 mL via INTRAVENOUS

## 2020-03-29 MED ORDER — DICYCLOMINE HCL 20 MG PO TABS
20.0000 mg | ORAL_TABLET | Freq: Once | ORAL | Status: AC
  Administered 2020-03-29: 12:00:00 20 mg via ORAL
  Filled 2020-03-29: qty 1

## 2020-03-29 MED ORDER — KETOROLAC TROMETHAMINE 30 MG/ML IJ SOLN
15.0000 mg | Freq: Once | INTRAMUSCULAR | Status: AC
  Administered 2020-03-29: 12:00:00 15 mg via INTRAVENOUS
  Filled 2020-03-29: qty 1

## 2020-03-29 MED ORDER — ESOMEPRAZOLE MAGNESIUM 40 MG PO CPDR: 40.0000 mg | DELAYED_RELEASE_CAPSULE | Freq: Every day | ORAL | 0 refills | Status: DC

## 2020-03-29 MED ORDER — METOCLOPRAMIDE HCL 10 MG PO TABS: 10.0000 mg | ORAL_TABLET | Freq: Four times a day (QID) | ORAL | 0 refills | Status: DC | PRN

## 2020-03-29 MED ORDER — ONDANSETRON HCL 4 MG/2ML IJ SOLN
4.0000 mg | Freq: Once | INTRAMUSCULAR | Status: AC
  Administered 2020-03-29: 12:00:00 4 mg via INTRAVENOUS
  Filled 2020-03-29: qty 2

## 2020-03-29 NOTE — ED Provider Notes (Signed)
[facility] EMERGENCY DEPARTMENT Provider Note   CSN: [id number] Arrival date & time: 03/29/20  1043     History Chief Complaint  Patient presents with  . Abdominal Pain    Sean Mccoy is a 59 y.o. male who presents emergency department with a chief complaint of abdominal pain.  Patient states that he was diagnosed with COVID-19 in January 2021.  During that time he had a lot of nausea and vomiting as well as diarrhea.  Patient states that since that time he has never fully recovered from his abdominal pain, nausea, vomiting, and diarrhea.  The patient states that some days are better than others but he has at least daily nausea and vomiting and daily diarrhea.  He complains of severely decreased appetite, daily and constant tenesmus and borborygmi.  He states that the abdominal pain is generalized and states that when he is able to eat something his stomach feels better.  He has had an estimated 20-30 pound weight loss.  He denies fevers, chills, or night sweats.  He denies melena, hematochezia, or hematemesis.  He has noticed abdominal bloating which is new and recently has seen dark urine.  He has a history of alcohol abuse but states that he really has not been able to drink much and maybe has an occasional once a week beer but has such a poor appetite that he has not been drinking anything.  He denies a history of shakes when he does not drink.  HPI     Past Medical History:  Diagnosis Date  . Abrasion of face 11/06/2018  . Anxiety   . COVID-19   . Depression   . Enlarged prostate   . HTN (hypertension) 05/04/2019  . Orbital floor fracture (HCC) 11/06/2018   right  . PTSD (post-traumatic stress disorder)   . Rheumatoid arthritis [facility])     Patient Active Problem List   Diagnosis Date Noted  . HTN (hypertension) 05/04/2019  . Facial fracture (HCC) 01/02/2019  . SDH (subdural hematoma) (HCC) 11/07/2018  . 'light-for-dates' infant with signs of fetal  malnutrition 03/20/2018  . Polysubstance (including opioids) dependence w/o physiol dependence (HCC) 09/11/2016  . Constipation due to pain medication therapy 02/17/2016  . Postoperative anemia due to acute blood loss 02/17/2016  . Osteoporosis 02/17/2016  . Right knee pain 02/17/2016  . Avascular necrosis of bone of right hip (HCC) 01/20/2016  . 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 Depression 11/29/2012  . Anxiety 11/29/2012  . Post traumatic stress disorder 11/29/2012  . Substance induced mood disorder (HCC) 11/29/2012  . Alcohol abuse 11/29/2012    Past Surgical History:  Procedure Laterality Date  . KNEE ARTHROSCOPY Right   . ORIF ORBITAL FRACTURE Right 11/22/2018   Procedure: OPEN REDUCTION INTERNAL FIXATION (ORIF) RIGHT TRIPOD FRACTURE;  Surgeon: Peggye Form, DO;  Location: [facility];  Service: Plastics;  Laterality: Right;  . TOTAL HIP ARTHROPLASTY Right 01/20/2016   Procedure: RIGHT TOTAL HIP ARTHROPLASTY ANTERIOR APPROACH;  Surgeon: Kathryne Hitch, MD;  Location: [facility] OR;  Service: Orthopedics;  Laterality: Right;       Family History  Problem Relation Age of Onset  . Diabetes Mother   . Hypertension Mother   . Diabetes Father   . Hypertension Father     Social History   Tobacco Use  . Smoking status: Never Smoker  . Smokeless tobacco: Never Used  Substance Use Topics  . Alcohol use: Yes  Comment: occasionally  . Drug use: No    Home Medications Prior to Admission medications   Medication Sig Start Date End Date Taking? Authorizing Provider  acetaminophen (TYLENOL) 500 MG tablet Take 1 tablet (500 mg total) by mouth every 6 (six) hours as needed. 11/08/19  Yes Law, Waylan Boga, PA-C  bismuth subsalicylate (PEPTO BISMOL) 262 MG chewable tablet Chew 524 mg by mouth as needed.   Yes [provider]  HYDROcodone-acetaminophen (NORCO/VICODIN) 5-325 MG tablet Take 1-2 tablets by mouth 2 (two) times  daily. 02/11/20  Yes [provider]  naproxen (NAPROSYN) 500 MG tablet Take 1 tablet (500 mg total) by mouth 2 (two) times daily. 11/08/19  Yes Law, Alexandra M, PA-C  omeprazole (PRILOSEC) 10 MG capsule Take 10 mg by mouth daily.   Yes [provider]  ondansetron (ZOFRAN) 4 MG tablet Take 1 tablet (4 mg total) by mouth every 6 (six) hours. 12/10/19  Yes Vanetta Mulders, MD  oxyCODONE (OXY IR/ROXICODONE) 5 MG immediate release tablet Take 1-2 tablets (5-10 mg total) by mouth every 6 (six) hours as needed for moderate pain. 11/09/18  Yes Barnetta Chapel, PA-C  predniSONE (DELTASONE) 10 MG tablet Take 4 tablets (40 mg total) by mouth daily. 12/10/19  Yes Vanetta Mulders, MD  pregabalin (LYRICA) 25 MG capsule Take 1 capsule by mouth in the morning and at bedtime. 03/26/20  Yes [provider]  dicyclomine (BENTYL) 20 MG tablet Take 1 tablet (20 mg total) by mouth 2 (two) times daily. 03/29/20   Arthor Captain, PA-C  esomeprazole (NEXIUM) 40 MG capsule Take 1 capsule (40 mg total) by mouth daily. 03/29/20   Arthor Captain, PA-C  metoCLOPramide (REGLAN) 10 MG tablet Take 1 tablet (10 mg total) by mouth every 6 (six) hours as needed for nausea (nausea/headache). 03/29/20   Arthor Captain, PA-C    Allergies    Patient has no known allergies.  Review of Systems   Review of Systems Ten systems reviewed and are negative for acute change, except as noted in the HPI.   Physical Exam Updated Vital Signs BP 137/85   Pulse 86   Temp 98 F (36.7 C)   Resp 18   Ht 5\' 10"  (1.778 m)   Wt 81.6 kg   SpO2 100%   BMI 25.81 kg/m   Physical Exam Physical Exam  Nursing note and vitals reviewed. Constitutional: He appears well-developed and well-nourished. No distress.  HENT:  Head: Normocephalic and atraumatic.  Eyes: Conjunctivae normal are normal. No scleral icterus.  Neck: Normal range of motion. Neck supple.  Cardiovascular: Normal rate, regular rhythm and normal heart  sounds.   Pulmonary/Chest: Effort normal and breath sounds normal. No respiratory distress.  Abdominal: Soft and distended.  Bowel sounds are hyperactive.  Generalized and mild tenderness to palpation.  Musculoskeletal: He exhibits no edema.  Neurological: He is alert and oriented x4 answers questions appropriately Skin: Skin is warm and dry. He is not diaphoretic.  Psychiatric: His behavior is normal.    ED Results / Procedures / Treatments   Labs (all labs ordered are listed, but only abnormal results are displayed) Labs Reviewed  COMPREHENSIVE METABOLIC PANEL - Abnormal; Notable for the following components:      Result Value   Sodium 133 (*)    Chloride 91 (*)    Glucose, Bld 103 (*)    BUN 5 (*)    Calcium 8.7 (*)    AST 225 (*)    ALT 73 (*)  Alkaline Phosphatase 178 (*)    Total Bilirubin 3.7 (*)    Anion gap 19 (*)    All other components within normal limits  CBC - Abnormal; Notable for the following components:   RBC 3.13 (*)    Hemoglobin 10.1 (*)    HCT 31.9 (*)    MCV 101.9 (*)    RDW 17.5 (*)    Platelets 131 (*)    nRBC 0.4 (*)    All other components within normal limits  URINALYSIS, ROUTINE W REFLEX MICROSCOPIC - Abnormal; Notable for the following components:   Color, Urine AMBER (*)    All other components within normal limits  LIPASE, BLOOD  HEPATITIS PANEL, ACUTE    EKG EKG Interpretation  Date/Time:  Saturday March 29 2020 10:55:50 EDT Ventricular Rate:  112 PR Interval:  140 QRS Duration: 82 QT Interval:  334 QTC Calculation: 455 R Axis:   56 Text Interpretation: Sinus tachycardia ST & T wave abnormality, consider inferior ischemia Abnormal ECG inferior and lateral changes more pronounced since last tracing Confirmed by Linwood Dibbles [id number]) on 03/29/2020 11:20:40 AM   Radiology CT Abdomen Pelvis W Contrast  Result Date: 03/29/2020 CLINICAL DATA:  C/o generalized abd pain x 2 months with nausea, vomiting, diarrhea, bloating, and loss of  appetite. Also reports SOB with exertion. States symptoms have been ongoing since he had COVID EXAM: CT ABDOMEN AND PELVIS WITH CONTRAST TECHNIQUE: Multidetector CT imaging of the abdomen and pelvis was performed using the standard protocol following bolus administration of intravenous contrast. CONTRAST:  OMNIPAQUE IOHEXOL 300 MG/ML  SOLN COMPARISON:  None. FINDINGS: Lower chest: Clear lung bases. Hepatobiliary: Liver enlarged, 25 cm transversely, with diffuse and extensive decreased parenchymal attenuation. No mass or focal lesion. Normal gallbladder. No bile duct dilation. Pancreas: Unremarkable. No pancreatic ductal dilatation or surrounding inflammatory changes. Spleen: Normal in size without focal abnormality. Adrenals/Urinary Tract: Adrenal glands are unremarkable. Kidneys are normal, without renal calculi, focal lesion, or hydronephrosis. Bladder is unremarkable. Stomach/Bowel: Stomach is within normal limits. Appendix appears normal. No evidence of bowel wall thickening, distention, or inflammatory changes. Vascular/Lymphatic: No significant vascular abnormality. No enlarged lymph nodes. Reproductive: Unremarkable. Other: No abdominal wall hernia or abnormality. No abdominopelvic ascites. Musculoskeletal: No acute fracture. Old left rib fractures. No osteoblastic or osteolytic lesions. Disc degenerative changes at L5-S1. Well-positioned, well-seated right total hip arthroplasty. IMPRESSION: 1. No acute findings within the abdomen or pelvis. 2. Hepatomegaly with extensive hepatic steatosis. Electronically Signed   By: Amie Portland M.D.   On: 03/29/2020 13:03    Procedures Procedures (including critical care time)  Medications Ordered in ED Medications  sodium chloride flush (NS) 0.9 % injection 3 mL (3 mLs Intravenous Given 03/29/20 1123)  sodium chloride 0.9 % bolus 1,000 mL (0 mLs Intravenous Stopped 03/29/20 1400)  ondansetron (ZOFRAN) injection 4 mg (4 mg Intravenous Given 03/29/20 1138)    ketorolac (TORADOL) 30 MG/ML injection 15 mg (15 mg Intravenous Given 03/29/20 1138)  dicyclomine (BENTYL) tablet 20 mg (20 mg Oral Given 03/29/20 1138)  iohexol (OMNIPAQUE) 300 MG/ML solution 100 mL (100 mLs Intravenous Contrast Given 03/29/20 1245)    ED Course  I have reviewed the triage vital signs and the nursing notes.  Pertinent labs & imaging results that were available during my care of the patient were reviewed by me and considered in my medical decision making (see chart for details).    MDM Rules/Calculators/A&P  CC:n/v generalized abdominal pain VS:  Vitals:   03/29/20 1130 03/29/20 1145 03/29/20 1200 03/29/20 1411  BP: 138/75 138/78 (!) 149/90 137/85  Pulse: (!) 108 (!) 108  86  Resp:    18  Temp:    98 F (36.7 C)  TempSrc:      SpO2: 95% 98%  100%  Weight:      Height:       ZO:XWRUEAV is gathered by patient and nursing triage. Previous records obtained and reviewed. DDX:The patient's complaint of nausea and vomiting involves an extensive number of diagnostic and treatment options, and is a complaint that carries with it a high risk of complications, morbidity, and potential mortality. Given the large differential diagnosis, medical decision making is of high complexity. The emergent differential diagnosis for vomiting includes, but is not limited to ACS/MI, Boerhaave's, DKA, Intracranial Hemorrhage, Ischemic bowel, Meningitis, Sepsis, Acute radiation syndrome, Acute gastric dilation, Acetaminophen toxicity, Adrenal insufficiency, Appendicitis, Aspirin toxicity, Bowel obstruction/ileus, Carbon monoxide poisoning, Cholecystitis, CNS tumor. Digoxin toxicity, Electrolyte abnormalities, Elevated ICP, Gastric outlet obstruction, Hyperemesis gravidarum, Pancreatitis, Peritonitis, Ruptured viscus, Testicular torsion/ovarian torsion, Theophyline toxicity, Biliary colic, Cannabinoid hyperemesis syndrome, Chemotherapy, Disulfiram effect, Erythromycin, ETOH,  Gastritis, Gastroenteritis, Gastroparesis, Hepatitis, Ibuprofen, Ipecac toxicity, Labyrinthitis, Migraine, Motion sickness, Narcotic withdrawal, Thyroid, Pregnancy, Peptic ulcer disease, Renal colic, and UTI Labs: I ordered reviewed and interpreted labs which include CMP, urinalysis, CBC. CMP shows acute on chronic liver disease with increased elevated liver enzymes, worsening bilirubin up from 1.49-month ago to 3.7 today.  Amber-colored urine which is likely urobilinogen although this was not commented upon.  Lipase is within normal limits.  CBC shows macrocytic anemia.  All of this points toward likely diagnosis of alcoholic hepatitis versus cirrhosis.Patient's anion gap is elevated likely due to dehydration.  Bicarb is within normal limits. Imaging: I ordered and reviewed images which included CT abdomen and pelvis. I independently visualized and interpreted all imaging. Significant findings include hepatic steatosis and hepatomegaly and normal caliber common bile duct without evidence of choledocholithiasis.  EKG: Sinus tachycardia at a rate of 112.  Inferolateral ischemic abnormalities which are chronic appears more pronounced today. Consults:n/a MDM: Patient here with chronic nausea and vomiting along with diffuse abdominal pain and cramping.  He has some abdominal bloating.  Question whether this is due to the patient's worsening liver disease.  I have ordered a hepatitis panel but do not feel the patient needs to stay for results.  The patient should follow-up with gastroenterology.  I doubt other cause such as cholelithiasis or choledocholithiasis given the patient has none on imaging and has a normal caliber CBD.  He has a benign abdominal exam today.  Plan on treating the patient symptomatically and having him follow closely with gastroenterology.  I also discussed that he should stop drinking all alcohol and not use Tylenol.  Patient macrocytic anemia also points to evidence of continued alcohol  misuse disorder.  I answered all the questions to the best of my ability for the patient. Patient disposition: Discharge The patient appears reasonably screened and/or stabilized for discharge and I doubt any other medical condition or other [facility] requiring further screening, evaluation, or treatment in the ED at this time prior to discharge. I have discussed lab and/or imaging findings with the patient and answered all questions/concerns to the best of my ability.I have discussed return precautions and OP follow up.    Final Clinical Impression(s) / ED Diagnoses Final diagnoses:  Liver disease  Nausea and vomiting, intractability of vomiting  not specified, unspecified vomiting type    Rx / DC Orders ED Discharge Orders         Ordered    esomeprazole (NEXIUM) 40 MG capsule  Daily     03/29/20 1406    metoCLOPramide (REGLAN) 10 MG tablet  Every 6 hours PRN     03/29/20 1406    dicyclomine (BENTYL) 20 MG tablet  2 times daily     03/29/20 1406           Margarita Mail, PA-C 03/29/20 1419    Dorie Rank, MD 03/29/20 2029

## 2020-03-29 NOTE — ED Triage Notes (Addendum)
C/o generalized abd pain x 2 months with nausea, vomiting, diarrhea, bloating, and loss of appetite.  Also reports SOB with exertion.  States symptoms have been ongoing since he had COVID.

## 2020-03-29 NOTE — Discharge Instructions (Addendum)
Your exam showed severe liver disease. You should immediately discontinue all alcohol use! Do not use tylenol. Continue frequent small sips (10-20 ml) of clear liquids every 5-10 minutes. Gatorade or powerade are good options. Avoid milk, orange juice, and grape juice for now. . Once you have not had further vomiting with the small sips for 4 hours, you may begin to drink larger volumes of fluids at a time and try a bland diet which may include saltine crackers, applesauce, breads, pastas, bananas, bland chicken. If you continues to vomit despite medication, return to the ED for repeat evaluation. Otherwise, follow up with your doctor in 2-3 days for a re-check.

## 2020-07-22 ENCOUNTER — Ambulatory Visit: Payer: Medicaid Other | Admitting: Physical Therapy

## 2020-07-30 ENCOUNTER — Ambulatory Visit: Payer: Medicaid Other | Attending: Nurse Practitioner | Admitting: Physical Therapy

## 2020-11-13 ENCOUNTER — Other Ambulatory Visit: Payer: Self-pay

## 2020-11-13 ENCOUNTER — Emergency Department (HOSPITAL_COMMUNITY)
Admission: EM | Admit: 2020-11-13 | Discharge: 2020-11-14 | Disposition: A | Discharge status: Home / Self Care | Payer: Medicaid Other | Attending: Emergency Medicine | Admitting: Emergency Medicine

## 2020-11-13 ENCOUNTER — Encounter (HOSPITAL_COMMUNITY): Payer: Self-pay | Admitting: Emergency Medicine

## 2020-11-13 DIAGNOSIS — M25572 Pain in left ankle and joints of left foot: Secondary | ICD-10-CM | POA: Insufficient documentation

## 2020-11-13 DIAGNOSIS — Z5321 Procedure and treatment not carried out due to patient leaving prior to being seen by health care provider: Secondary | ICD-10-CM | POA: Diagnosis not present

## 2020-11-13 LAB — CBC
HCT: 29.8 % — ABNORMAL LOW (ref 39.0–52.0)
Hemoglobin: 9.8 g/dL — ABNORMAL LOW (ref 13.0–17.0)
MCH: 32.9 pg (ref 26.0–34.0)
MCHC: 32.9 g/dL (ref 30.0–36.0)
MCV: 100 fL (ref 80.0–100.0)
Platelets: 176 10*3/uL (ref 150–400)
RBC: 2.98 MIL/uL — ABNORMAL LOW (ref 4.22–5.81)
RDW: 15 % (ref 11.5–15.5)
WBC: 4.3 10*3/uL (ref 4.0–10.5)
nRBC: 0 % (ref 0.0–0.2)

## 2020-11-13 LAB — BASIC METABOLIC PANEL
Anion gap: 11 (ref 5–15)
BUN: 6 mg/dL (ref 6–20)
CO2: 24 mmol/L (ref 22–32)
Calcium: 9.5 mg/dL (ref 8.9–10.3)
Chloride: 100 mmol/L (ref 98–111)
Creatinine, Ser: 0.64 mg/dL (ref 0.61–1.24)
GFR, Estimated: >60 mL/min (ref >60)
Glucose, Bld: 96 mg/dL (ref 70–99)
Potassium: 4.4 mmol/L (ref 3.5–5.1)
Sodium: 135 mmol/L (ref 135–145)

## 2020-11-13 NOTE — ED Notes (Signed)
Pt called for vitals x3, no answer 

## 2020-11-13 NOTE — ED Triage Notes (Signed)
Pt endorses left ankle pain. Endorses swelling in both legs and ankles for a few days.

## 2020-12-12 ENCOUNTER — Encounter (HOSPITAL_COMMUNITY): Payer: Self-pay | Admitting: Pharmacy Technician

## 2020-12-12 ENCOUNTER — Emergency Department (HOSPITAL_COMMUNITY): Payer: Medicaid Other

## 2020-12-12 ENCOUNTER — Emergency Department (HOSPITAL_COMMUNITY)
Admission: EM | Admit: 2020-12-12 | Discharge: 2020-12-12 | Disposition: A | Discharge status: Home / Self Care | Payer: Medicaid Other | Attending: Emergency Medicine | Admitting: Emergency Medicine

## 2020-12-12 DIAGNOSIS — M25572 Pain in left ankle and joints of left foot: Secondary | ICD-10-CM | POA: Diagnosis not present

## 2020-12-12 DIAGNOSIS — R221 Localized swelling, mass and lump, neck: Secondary | ICD-10-CM | POA: Diagnosis not present

## 2020-12-12 DIAGNOSIS — Z8616 Personal history of COVID-19: Secondary | ICD-10-CM | POA: Insufficient documentation

## 2020-12-12 DIAGNOSIS — Z96641 Presence of right artificial hip joint: Secondary | ICD-10-CM | POA: Diagnosis not present

## 2020-12-12 DIAGNOSIS — R222 Localized swelling, mass and lump, trunk: Secondary | ICD-10-CM | POA: Diagnosis not present

## 2020-12-12 DIAGNOSIS — I10 Essential (primary) hypertension: Secondary | ICD-10-CM | POA: Diagnosis not present

## 2020-12-12 NOTE — Progress Notes (Signed)
Orthopedic Tech Progress Note Patient Details:  Sean Mccoy [birth year] [id number]  Ortho Devices Type of Ortho Device: ASO Ortho Device/Splint Location: Patient Left Ankle Ortho Device/Splint Interventions: Adjustment,Application,Ordered   Post Interventions Patient Tolerated: Well Instructions Provided: Adjustment of device,Care of device   Sean Mccoy 12/12/2020, 5:22 PM

## 2020-12-12 NOTE — ED Provider Notes (Signed)
[facility] EMERGENCY DEPARTMENT Provider Note   CSN: [id number] Arrival date & time: 12/12/20  1118     History Chief Complaint  Patient presents with  . Abscess  . Ankle Pain    Sean Mccoy is a 59 y.o. male.  The history is provided by the patient. No language interpreter was used.  Ankle Pain Location:  Ankle Injury: no   Ankle location:  L ankle Pain details:    Quality:  Aching   Radiates to:  Does not radiate   Severity:  Moderate   Onset quality:  Gradual   Duration:  2 weeks   Timing:  Constant   Progression:  Worsening Chronicity:  New Dislocation: no   Prior injury to area:  No Relieved by:  Nothing Worsened by:  Nothing Ineffective treatments:  None tried Risk factors: no concern for non-accidental trauma   Pt complains of pain to his left ankle, pt reports pain with walking.     Pt alos wants to get a lump on his neck and his chest evaluated.  Pt reports areas began swelling 6 months ago and are getting larger.   Past Medical History:  Diagnosis Date  . Abrasion of face 11/06/2018  . Anxiety   . COVID-19   . Depression   . Enlarged prostate   . HTN (hypertension) 05/04/2019  . Orbital floor fracture (HCC) 11/06/2018   right  . PTSD (post-traumatic stress disorder)   . Rheumatoid arthritis [facility])     Patient Active Problem List   Diagnosis Date Noted  . HTN (hypertension) 05/04/2019  . Facial fracture (HCC) 01/02/2019  . SDH (subdural hematoma) (HCC) 11/07/2018  . 'light-for-dates' infant with signs of fetal malnutrition 03/20/2018  . Polysubstance (including opioids) dependence w/o physiol dependence (HCC) 09/11/2016  . Constipation due to pain medication therapy 02/17/2016  . Postoperative anemia due to acute blood loss 02/17/2016  . Osteoporosis 02/17/2016  . Right knee pain 02/17/2016  . Avascular necrosis of bone of right hip (HCC) 01/20/2016  . 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 Depression  11/29/2012  . Anxiety 11/29/2012  . Post traumatic stress disorder 11/29/2012  . Substance induced mood disorder (HCC) 11/29/2012  . Alcohol abuse 11/29/2012    Past Surgical History:  Procedure Laterality Date  . KNEE ARTHROSCOPY Right   . ORIF ORBITAL FRACTURE Right 11/22/2018   Procedure: OPEN REDUCTION INTERNAL FIXATION (ORIF) RIGHT TRIPOD FRACTURE;  Surgeon: Peggye Form, DO;  Location: [facility];  Service: Plastics;  Laterality: Right;  . TOTAL HIP ARTHROPLASTY Right 01/20/2016   Procedure: RIGHT TOTAL HIP ARTHROPLASTY ANTERIOR APPROACH;  Surgeon: Kathryne Hitch, MD;  Location: [facility];  Service: Orthopedics;  Laterality: Right;       Family History  Problem Relation Age of Onset  . Diabetes Mother   . Hypertension Mother   . Diabetes Father   . Hypertension Father     Social History   Tobacco Use  . Smoking status: Never Smoker  . Smokeless tobacco: Never Used  Vaping Use  . Vaping Use: Never used  Substance Use Topics  . Alcohol use: Yes    Comment: occasionally  . Drug use: No    Home Medications Prior to Admission medications   Medication Sig Start Date End Date Taking? Authorizing Provider  acetaminophen (TYLENOL) 500 MG tablet Take 1 tablet (500 mg total) by mouth every 6 (six) hours as needed. 11/08/19   Emi Holes, PA-C  bismuth subsalicylate (PEPTO BISMOL) 262 MG chewable tablet Chew 524 mg by mouth as needed.    [provider]  dicyclomine (BENTYL) 20 MG tablet Take 1 tablet (20 mg total) by mouth 2 (two) times daily. 03/29/20   Arthor Captain, PA-C  esomeprazole (NEXIUM) 40 MG capsule Take 1 capsule (40 mg total) by mouth daily. 03/29/20   Arthor Captain, PA-C  HYDROcodone-acetaminophen (NORCO/VICODIN) 5-325 MG tablet Take 1-2 tablets by mouth 2 (two) times daily. 02/11/20   [provider]  metoCLOPramide (REGLAN) 10 MG tablet Take 1 tablet (10 mg total) by mouth every 6 (six) hours as needed for  nausea (nausea/headache). 03/29/20   Harris, Cammy Copa, PA-C  naproxen (NAPROSYN) 500 MG tablet Take 1 tablet (500 mg total) by mouth 2 (two) times daily. 11/08/19   Law, Waylan Boga, PA-C  omeprazole (PRILOSEC) 10 MG capsule Take 10 mg by mouth daily.    [provider]  ondansetron (ZOFRAN) 4 MG tablet Take 1 tablet (4 mg total) by mouth every 6 (six) hours. 12/10/19   Vanetta Mulders, MD  oxyCODONE (OXY IR/ROXICODONE) 5 MG immediate release tablet Take 1-2 tablets (5-10 mg total) by mouth every 6 (six) hours as needed for moderate pain. 11/09/18   Barnetta Chapel, PA-C  predniSONE (DELTASONE) 10 MG tablet Take 4 tablets (40 mg total) by mouth daily. 12/10/19   Vanetta Mulders, MD  pregabalin (LYRICA) 25 MG capsule Take 1 capsule by mouth in the morning and at bedtime. 03/26/20   [provider]    Allergies    Patient has no known allergies.  Review of Systems   Review of Systems  Musculoskeletal: Positive for joint swelling and myalgias.  All other systems reviewed and are negative.   Physical Exam Updated Vital Signs BP (!) 150/79   Pulse (!) 110   Temp 98.2 F (36.8 C) (Oral)   Resp 18   Ht 5\' 10"  (1.778 m)   Wt 77.1 kg   SpO2 98%   BMI 24.39 kg/m   Physical Exam Vitals and nursing note reviewed.  Constitutional:      Appearance: He is well-developed and well-nourished.  HENT:     Head: Normocephalic and atraumatic.  Eyes:     Conjunctiva/sclera: Conjunctivae normal.  Neck:     Comments: 5cm swollen area base of left neck,   6cm swollen area mid chest.   Cardiovascular:     Rate and Rhythm: Normal rate and regular rhythm.     Heart sounds: No murmur heard.   Pulmonary:     Effort: Pulmonary effort is normal. No respiratory distress.     Breath sounds: Normal breath sounds.  Abdominal:     Palpations: Abdomen is soft.     Tenderness: There is no abdominal tenderness.  Musculoskeletal:        General: Swelling and tenderness present. No edema.      Cervical back: Neck supple.     Comments: No deformity, no erythema  nv and ns intact  Skin:    General: Skin is warm and dry.  Neurological:     General: No focal deficit present.     Mental Status: He is alert.  Psychiatric:        Mood and Affect: Mood and affect and mood normal.     ED Results / Procedures / Treatments   Labs (all labs ordered are listed, but only abnormal results are displayed) Labs Reviewed - No data to display  EKG None  Radiology DG  Ankle Complete Left  Result Date: 12/12/2020 CLINICAL DATA:  Pain EXAM: LEFT ANKLE COMPLETE - 3+ VIEW COMPARISON:  None. FINDINGS: Frontal, oblique, and lateral views were obtained. There is soft tissue swelling, primarily laterally. No evident fracture or joint effusion. No joint space narrowing or erosion. Ankle mortise appears intact. IMPRESSION: Soft tissue swelling. No evident fracture. No appreciable joint space narrowing. Ankle mortise appears intact. Electronically Signed   By: Bretta Bang III M.D.   On: 12/12/2020 13:52    Procedures Procedures (including critical care time)  Medications Ordered in ED Medications - No data to display  ED Course  I have reviewed the triage vital signs and the nursing notes.  Pertinent labs & imaging results that were available during my care of the patient were reviewed by me and considered in my medical decision making (see chart for details).  Clinical Course as of 12/12/20 1623  Fri Dec 12, 2020  466 59 year old male here with complaint of left ankle pain.  He also has a mass behind his left ear and on his chest that he said is been growing in size.  He says they are little tender when he lays on them.  They both seem somewhat firm with no overlying signs of infection.  Doubt abscess.  Likely will need a biopsy or excision [MB]    Clinical Course User Index [MB] Terrilee Files, MD   MDM Rules/Calculators/A&P                          MDM:  Ankle xray is  normal.  Pt placed in an aso and advised to follow up with Orthopaedist.  Dr. Charm Barges examined swollen areas.  Pt referred to general surgery for evaluation  Final Clinical Impression(s) / ED Diagnoses Final diagnoses:  Acute left ankle pain  Mass of chest wall  Mass of left side of neck    Rx / DC Orders ED Discharge Orders    None    An After Visit Summary was printed and given to the patient.    Elson Areas, [place] 12/12/20 1635    Terrilee Files, MD 12/12/20 [phone]

## 2020-12-12 NOTE — ED Triage Notes (Signed)
Pt here with abcess behind L ear onset approx a few months ago with recent increase in size. Pt also with L ankle pain. Denies injury.

## 2022-01-04 ENCOUNTER — Emergency Department (HOSPITAL_COMMUNITY): Payer: Medicaid Other

## 2022-01-04 ENCOUNTER — Emergency Department (HOSPITAL_COMMUNITY)
Admission: EM | Admit: 2022-01-04 | Discharge: 2022-01-05 | Discharge status: Against Medical Advice | Payer: Medicaid Other | Attending: Emergency Medicine | Admitting: Emergency Medicine

## 2022-01-04 DIAGNOSIS — T1490XA Injury, unspecified, initial encounter: Secondary | ICD-10-CM

## 2022-01-04 DIAGNOSIS — Z5321 Procedure and treatment not carried out due to patient leaving prior to being seen by health care provider: Secondary | ICD-10-CM | POA: Insufficient documentation

## 2022-01-04 DIAGNOSIS — M79671 Pain in right foot: Secondary | ICD-10-CM | POA: Insufficient documentation

## 2022-01-04 DIAGNOSIS — W268XXA Contact with other sharp object(s), not elsewhere classified, initial encounter: Secondary | ICD-10-CM | POA: Diagnosis not present

## 2022-01-04 LAB — LACTIC ACID, PLASMA: Lactic Acid, Venous: 1.6 mmol/L (ref 0.5–1.9)

## 2022-01-04 LAB — COMPREHENSIVE METABOLIC PANEL
ALT: 32 U/L (ref 0–44)
AST: 77 U/L — ABNORMAL HIGH (ref 15–41)
Albumin: 4 g/dL (ref 3.5–5.0)
Alkaline Phosphatase: 126 U/L (ref 38–126)
Anion gap: 11 (ref 5–15)
BUN: 8 mg/dL (ref 6–20)
CO2: 23 mmol/L (ref 22–32)
Calcium: 9.1 mg/dL (ref 8.9–10.3)
Chloride: 97 mmol/L — ABNORMAL LOW (ref 98–111)
Creatinine, Ser: 0.72 mg/dL (ref 0.61–1.24)
GFR, Estimated: >60 mL/min (ref >60)
Glucose, Bld: 93 mg/dL (ref 70–99)
Potassium: 4.6 mmol/L (ref 3.5–5.1)
Sodium: 131 mmol/L — ABNORMAL LOW (ref 135–145)
Total Bilirubin: 0.5 mg/dL (ref 0.3–1.2)
Total Protein: 8.7 g/dL — ABNORMAL HIGH (ref 6.5–8.1)

## 2022-01-04 LAB — CBC WITH DIFFERENTIAL/PLATELET
Abs Immature Granulocytes: 0.03 10*3/uL (ref 0.00–0.07)
Basophils Absolute: 0 10*3/uL (ref 0.0–0.1)
Basophils Relative: 1 %
Eosinophils Absolute: 0.1 10*3/uL (ref 0.0–0.5)
Eosinophils Relative: 1 %
HCT: 34.4 % — ABNORMAL LOW (ref 39.0–52.0)
Hemoglobin: 11.3 g/dL — ABNORMAL LOW (ref 13.0–17.0)
Immature Granulocytes: 1 %
Lymphocytes Relative: 41 %
Lymphs Abs: 2.7 10*3/uL (ref 0.7–4.0)
MCH: 31.6 pg (ref 26.0–34.0)
MCHC: 32.8 g/dL (ref 30.0–36.0)
MCV: 96.1 fL (ref 80.0–100.0)
Monocytes Absolute: 0.8 10*3/uL (ref 0.1–1.0)
Monocytes Relative: 12 %
Neutro Abs: 2.9 10*3/uL (ref 1.7–7.7)
Neutrophils Relative %: 44 %
Platelets: 185 10*3/uL (ref 150–400)
RBC: 3.58 MIL/uL — ABNORMAL LOW (ref 4.22–5.81)
RDW: 13.8 % (ref 11.5–15.5)
WBC: 6.5 10*3/uL (ref 4.0–10.5)
nRBC: 0 % (ref 0.0–0.2)

## 2022-01-04 NOTE — ED Provider Triage Note (Signed)
Emergency Medicine Provider Triage Evaluation Note  Sean Mccoy , a 61 y.o. male  was evaluated in triage.  Pt complains of wound to right foot.  He said he excellently cut himself on a piece of furniture 1 month prior.  Wound has gotten progressively worse since then.  Patient is unsure if he has had any purulent discharge.  States that the foot has become progressively more swollen over this time.  Patient complains of pain to entire right foot.  Review of Systems  Positive: Wounds, swelling, right foot pain Negative: Fever, chills, numbness, weakness  Physical Exam  BP (!) 161/85 (BP Location: Left Arm)    Pulse (!) 105    Temp 98.2 F (36.8 C)    Resp 18    SpO2 97%  Gen:   Awake, no distress   Resp:  Normal effort  MSK:   Moves extremities without difficulty  Other:  +2 edema to right foot and ankle.  +2 right DP pulse.  Patient has sensation intact to all digits of right foot.  Patient has full range of motion to all digits of right foot.  Wound to lateral aspect of right foot with surrounding erythema.  Medical Decision Making  Medically screening exam initiated at 8:29 PM.  Appropriate orders placed.  Sean Mccoy was informed that the remainder of the evaluation will be completed by another provider, this initial triage assessment does not replace that evaluation, and the importance of remaining in the ED until their evaluation is complete.     Loni Beckwith, [place] 01/04/22 2031

## 2022-01-04 NOTE — ED Triage Notes (Signed)
Pt c/o continued R foot pain, scraped his foot approx 8mo ago, worsening pain/infection since. Pt currently has 1.5in circular wound on R foot. Used epsom salt soaks, neosporin & peroxide at home. Denies diabetes hx, denies fevers, SHOB.

## 2022-01-05 NOTE — ED Notes (Signed)
Patient decided to leave patient stated that he will return later

## 2022-01-05 NOTE — ED Notes (Signed)
Pt did not answer when called for v/s x3 will try again.

## 2022-01-05 NOTE — ED Notes (Signed)
PT called for vitals ,  ED waiting room check  , PT did not answer nor , visibly seen. PT left AMA. 7:32am

## 2022-02-11 ENCOUNTER — Ambulatory Visit: Payer: Self-pay | Admitting: Podiatry

## 2022-02-17 ENCOUNTER — Other Ambulatory Visit: Payer: Self-pay

## 2022-02-17 ENCOUNTER — Ambulatory Visit (INDEPENDENT_AMBULATORY_CARE_PROVIDER_SITE_OTHER): Payer: Medicaid Other | Admitting: Podiatry

## 2022-02-17 DIAGNOSIS — L97312 Non-pressure chronic ulcer of right ankle with fat layer exposed: Secondary | ICD-10-CM

## 2022-02-17 MED ORDER — DOXYCYCLINE HYCLATE 100 MG PO TABS: 100.0000 mg | ORAL_TABLET | Freq: Two times a day (BID) | ORAL | 0 refills | Status: AC

## 2022-02-19 ENCOUNTER — Encounter: Payer: Self-pay | Admitting: Podiatry

## 2022-02-19 NOTE — Progress Notes (Signed)
Subjective:  Patient ID: Sean Mccoy, male    DOB: Sep 20, 1961,  MRN: [id number]  No chief complaint on file.   61 y.o. male presents for wound care.  Patient presents with right lateral ankle wound with fat layer exposed.  4 months has progressed gotten worse.  Patient has a history of lymphedema that could likely lead to venous stasis ulcer.  He wanted to get it evaluated he has not seen anyone else prior to seeing me.  He has not been putting anything on it beside triple antibiotic.  Hurts with ambulation hurts with pressure and touching.   Review of Systems: Negative except as noted in the HPI. Denies N/V/F/Ch.  Past Medical History:  Diagnosis Date   Abrasion of face 11/06/2018   Anxiety    COVID-19    Depression    Enlarged prostate    HTN (hypertension) 05/04/2019   Orbital floor fracture (HCC) 11/06/2018   right   PTSD (post-traumatic stress disorder)    Rheumatoid arthritis (HCC)     Current Outpatient Medications:    doxycycline (VIBRA-TABS) 100 MG tablet, Take 1 tablet (100 mg total) by mouth 2 (two) times daily for 14 days., Disp: 28 tablet, Rfl: 0   acetaminophen (TYLENOL) 500 MG tablet, Take 1 tablet (500 mg total) by mouth every 6 (six) hours as needed. (Patient not taking: Reported on 01/04/2022), Disp: 30 tablet, Rfl: 0   bismuth subsalicylate (PEPTO BISMOL) 262 MG chewable tablet, Chew 524 mg by mouth as needed. (Patient not taking: Reported on 01/04/2022), Disp: , Rfl:    dicyclomine (BENTYL) 20 MG tablet, Take 1 tablet (20 mg total) by mouth 2 (two) times daily. (Patient not taking: Reported on 01/04/2022), Disp: 20 tablet, Rfl: 0   esomeprazole (NEXIUM) 40 MG capsule, Take 1 capsule (40 mg total) by mouth daily. (Patient not taking: Reported on 01/04/2022), Disp: 30 capsule, Rfl: 0   metoCLOPramide (REGLAN) 10 MG tablet, Take 1 tablet (10 mg total) by mouth every 6 (six) hours as needed for nausea (nausea/headache). (Patient not taking: Reported on 01/04/2022), Disp: 20  tablet, Rfl: 0   naproxen (NAPROSYN) 500 MG tablet, Take 1 tablet (500 mg total) by mouth 2 (two) times daily. (Patient not taking: Reported on 01/04/2022), Disp: 30 tablet, Rfl: 0   ondansetron (ZOFRAN) 4 MG tablet, Take 1 tablet (4 mg total) by mouth every 6 (six) hours. (Patient not taking: Reported on 01/04/2022), Disp: 12 tablet, Rfl: 0   oxyCODONE (OXY IR/ROXICODONE) 5 MG immediate release tablet, Take 1-2 tablets (5-10 mg total) by mouth every 6 (six) hours as needed for moderate pain. (Patient not taking: Reported on 01/04/2022), Disp: 20 tablet, Rfl: 0   predniSONE (DELTASONE) 10 MG tablet, Take 4 tablets (40 mg total) by mouth daily. (Patient not taking: Reported on 01/04/2022), Disp: 20 tablet, Rfl: 0  Social History   Tobacco Use  Smoking Status Never  Smokeless Tobacco Never    No Known Allergies Objective:  There were no vitals filed for this visit. There is no height or weight on file to calculate BMI. Constitutional Well developed. Well nourished.  Vascular Dorsalis pedis pulses faintly palpable bilaterally. Posterior tibial pulses faintly palpable bilaterally. Capillary refill diminished to all digits.  No cyanosis or clubbing noted. Pedal hair growth normal.  Neurologic Normal speech. Oriented to person, place, and time. Protective sensation absent  Dermatologic Wound Location: Right lateral ankle wound with fat layer exposed.  Fibrogranular wound bed noted.  Arterial/venous stasis ulceration noted. Wound Base: Mixed  Granular/Fibrotic Peri-wound: Macerated Exudate: Scant/small amount Serosanguinous exudate Wound Measurements: -See below  Orthopedic: No pain to palpation either foot.   Radiographs: None Assessment:   1. Ankle ulcer, right, with fat layer exposed ([place])    Plan:  Patient was evaluated and treated and all questions answered.  Ulcer right lateral ankle ulceration with fat layer exposed secondary to underlying venous stasis -Debridement as  below. -Dressed with Betadine wet-to-dry, DSD. -Continue off-loading with surgical shoe. -Doxycycline was dispensed for skin and soft tissue prophylaxis  Procedure: Excisional Debridement of Wound Tool: Sharp chisel blade/tissue nipper Rationale: Removal of non-viable soft tissue from the wound to promote healing.  Anesthesia: none Pre-Debridement Wound Measurements: 1.5 cm x 1.3 cm x 0.3 cm  Post-Debridement Wound Measurements: 1.7 cm x 1.4 cm x 0.3 cm  Type of Debridement: Sharp Excisional Tissue Removed: Non-viable soft tissue Blood loss: Minimal (<50cc) Depth of Debridement: subcutaneous tissue. Technique: Sharp excisional debridement to bleeding, viable wound base.  Wound Progress: This is my initial evaluation of continue to monitor the progression of the wound Site healing conversation 7 Dressing: Dry, sterile, compression dressing. Disposition: Patient tolerated procedure well. Patient to return in 1 week for follow-up.  No follow-ups on file.

## 2022-03-09 ENCOUNTER — Encounter (HOSPITAL_COMMUNITY): Payer: Self-pay

## 2022-03-09 ENCOUNTER — Other Ambulatory Visit: Payer: Self-pay

## 2022-03-09 ENCOUNTER — Emergency Department (HOSPITAL_COMMUNITY)
Admission: EM | Admit: 2022-03-09 | Discharge: 2022-03-10 | Disposition: A | Discharge status: Home / Self Care | Payer: Medicaid Other | Attending: Emergency Medicine | Admitting: Emergency Medicine

## 2022-03-09 ENCOUNTER — Emergency Department (HOSPITAL_COMMUNITY): Payer: Medicaid Other

## 2022-03-09 DIAGNOSIS — I10 Essential (primary) hypertension: Secondary | ICD-10-CM | POA: Diagnosis not present

## 2022-03-09 DIAGNOSIS — Z79899 Other long term (current) drug therapy: Secondary | ICD-10-CM | POA: Insufficient documentation

## 2022-03-09 DIAGNOSIS — B353 Tinea pedis: Secondary | ICD-10-CM | POA: Insufficient documentation

## 2022-03-09 DIAGNOSIS — L97411 Non-pressure chronic ulcer of right heel and midfoot limited to breakdown of skin: Secondary | ICD-10-CM | POA: Diagnosis present

## 2022-03-09 DIAGNOSIS — L97511 Non-pressure chronic ulcer of other part of right foot limited to breakdown of skin: Secondary | ICD-10-CM

## 2022-03-09 LAB — BASIC METABOLIC PANEL
Anion gap: 15 (ref 5–15)
BUN: 8 mg/dL (ref 6–20)
CO2: 23 mmol/L (ref 22–32)
Calcium: 9.2 mg/dL (ref 8.9–10.3)
Chloride: 92 mmol/L — ABNORMAL LOW (ref 98–111)
Creatinine, Ser: 0.68 mg/dL (ref 0.61–1.24)
GFR, Estimated: >60 mL/min (ref >60)
Glucose, Bld: 102 mg/dL — ABNORMAL HIGH (ref 70–99)
Potassium: 4.2 mmol/L (ref 3.5–5.1)
Sodium: 130 mmol/L — ABNORMAL LOW (ref 135–145)

## 2022-03-09 LAB — CBC WITH DIFFERENTIAL/PLATELET
Abs Immature Granulocytes: 0.02 10*3/uL (ref 0.00–0.07)
Basophils Absolute: 0 10*3/uL (ref 0.0–0.1)
Basophils Relative: 1 %
Eosinophils Absolute: 0 10*3/uL (ref 0.0–0.5)
Eosinophils Relative: 1 %
HCT: 33.7 % — ABNORMAL LOW (ref 39.0–52.0)
Hemoglobin: 11.2 g/dL — ABNORMAL LOW (ref 13.0–17.0)
Immature Granulocytes: 0 %
Lymphocytes Relative: 36 %
Lymphs Abs: 2.1 10*3/uL (ref 0.7–4.0)
MCH: 32.2 pg (ref 26.0–34.0)
MCHC: 33.2 g/dL (ref 30.0–36.0)
MCV: 96.8 fL (ref 80.0–100.0)
Monocytes Absolute: 0.8 10*3/uL (ref 0.1–1.0)
Monocytes Relative: 15 %
Neutro Abs: 2.7 10*3/uL (ref 1.7–7.7)
Neutrophils Relative %: 47 %
Platelets: 171 10*3/uL (ref 150–400)
RBC: 3.48 MIL/uL — ABNORMAL LOW (ref 4.22–5.81)
RDW: 14.8 % (ref 11.5–15.5)
WBC: 5.7 10*3/uL (ref 4.0–10.5)
nRBC: 0 % (ref 0.0–0.2)

## 2022-03-09 LAB — LACTIC ACID, PLASMA
Lactic Acid, Venous: 2.7 mmol/L (ref 0.5–1.9)
Lactic Acid, Venous: 2.9 mmol/L (ref 0.5–1.9)

## 2022-03-09 MED ORDER — CEPHALEXIN 250 MG PO CAPS
1000.0000 mg | ORAL_CAPSULE | Freq: Once | ORAL | Status: AC
  Administered 2022-03-09: 1000 mg via ORAL
  Filled 2022-03-09: qty 4

## 2022-03-09 MED ORDER — OXYCODONE-ACETAMINOPHEN 5-325 MG PO TABS
1.0000 | ORAL_TABLET | Freq: Once | ORAL | Status: AC
  Administered 2022-03-09: 1 via ORAL
  Filled 2022-03-09: qty 1

## 2022-03-09 MED ORDER — CEPHALEXIN 500 MG PO CAPS: ORAL_CAPSULE | ORAL | 0 refills | Status: DC

## 2022-03-09 MED ORDER — CEPHALEXIN 500 MG PO CAPS: 1000.0000 mg | ORAL_CAPSULE | Freq: Four times a day (QID) | ORAL | 0 refills | Status: DC

## 2022-03-09 MED ORDER — KETOCONAZOLE 2 % EX CREA: 1.0000 "application " | TOPICAL_CREAM | Freq: Every day | CUTANEOUS | 0 refills | Status: DC

## 2022-03-09 NOTE — ED Provider Notes (Signed)
?[facility] ?Provider Note ? ? ?CSN: [id number] ?Arrival date & time: 03/09/22  1721 ? ?  ? ?History ? ?Chief Complaint  ?Patient presents with  ? Foot Ulcer  ? ?Sean Mccoy is a 61 y/o M with a hx of HTN, AVN of R hip, and chronic opioid use who presents to the ED for evaluation of painful ulcers on his R foot. Pt states he first noticed the ulcers two months ago and they have been increasingly worsening since then. Pt state the ulcer to his R lateral malleolar area began first and has had purulent drainage recently. He notes the ulcer to the nailbed of his R hallux is also draining. Denies any fever, chills, or hx of DM. He denies a hx of similar ulcers in the past. Pt reports he has chronic BLE edema for the last year that he takes a fluid pill for. He denies any chronic steroid use or immunosuppresion. Denies any CP, SOB, NVD, abd pain, urinary sxs, back pain, neck pain, or any other medical complaints.  ? ?HPI ? ?  ? ?Home Medications ?Prior to Admission medications   ?Medication Sig Start Date End Date Taking? Authorizing Provider  ?cephALEXin (KEFLEX) 500 MG capsule 2 caps po bid x 7 days 03/09/22  Yes Haya Hemler, PA-C  ?ketoconazole (NIZORAL) 2 % cream Apply 1 application. topically daily. 03/09/22  Yes Margarita Mail, PA-C  ?acetaminophen (TYLENOL) 500 MG tablet Take 1 tablet (500 mg total) by mouth every 6 (six) hours as needed. ?Patient not taking: Reported on 01/04/2022 11/08/19   Frederica Kuster, PA-C  ?bismuth subsalicylate (PEPTO BISMOL) 262 MG chewable tablet Chew 524 mg by mouth as needed. ?Patient not taking: Reported on 01/04/2022    [provider]  ?dicyclomine (BENTYL) 20 MG tablet Take 1 tablet (20 mg total) by mouth 2 (two) times daily. ?Patient not taking: Reported on 01/04/2022 03/29/20   Margarita Mail, PA-C  ?esomeprazole (NEXIUM) 40 MG capsule Take 1 capsule (40 mg total) by mouth daily. ?Patient not taking: Reported on 01/04/2022 03/29/20    Margarita Mail, PA-C  ?metoCLOPramide (REGLAN) 10 MG tablet Take 1 tablet (10 mg total) by mouth every 6 (six) hours as needed for nausea (nausea/headache). ?Patient not taking: Reported on 01/04/2022 03/29/20   Margarita Mail, PA-C  ?naproxen (NAPROSYN) 500 MG tablet Take 1 tablet (500 mg total) by mouth 2 (two) times daily. ?Patient not taking: Reported on 01/04/2022 11/08/19   Frederica Kuster, PA-C  ?ondansetron (ZOFRAN) 4 MG tablet Take 1 tablet (4 mg total) by mouth every 6 (six) hours. ?Patient not taking: Reported on 01/04/2022 12/10/19   Fredia Sorrow, MD  ?oxyCODONE (OXY IR/ROXICODONE) 5 MG immediate release tablet Take 1-2 tablets (5-10 mg total) by mouth every 6 (six) hours as needed for moderate pain. ?Patient not taking: Reported on 01/04/2022 11/09/18   Saverio Danker, PA-C  ?predniSONE (DELTASONE) 10 MG tablet Take 4 tablets (40 mg total) by mouth daily. ?Patient not taking: Reported on 01/04/2022 12/10/19   Fredia Sorrow, MD  ?   ? ?Allergies    ?Patient has no known allergies.   ? ?Review of Systems   ?Review of Systems ? ?Physical Exam ?Updated Vital Signs ?BP 133/77   Pulse 87   Temp 98.6 ?F (37 ?C)   Resp 17   Ht 5\' 10"  (1.778 m)   Wt 77.1 kg   SpO2 100%   BMI 24.39 kg/m?  ?Physical Exam ?Vitals and nursing note reviewed.  ?  Constitutional:   ?   General: He is not in acute distress. ?   Appearance: He is well-developed. He is not diaphoretic.  ?HENT:  ?   Head: Normocephalic and atraumatic.  ?Eyes:  ?   General: No scleral icterus. ?   Conjunctiva/sclera: Conjunctivae normal.  ?Cardiovascular:  ?   Rate and Rhythm: Normal rate and regular rhythm.  ?   Pulses:     ?     Popliteal pulses are 2+ on the right side and 2+ on the left side.  ?     Dorsalis pedis pulses are 2+ on the right side and 2+ on the left side.  ?   Heart sounds: Normal heart sounds.  ?Pulmonary:  ?   Effort: Pulmonary effort is normal. No respiratory distress.  ?   Breath sounds: Normal breath sounds.  ?Abdominal:  ?    Palpations: Abdomen is soft.  ?   Tenderness: There is no abdominal tenderness.  ?Musculoskeletal:  ?   Cervical back: Normal range of motion and neck supple.  ?   Comments: Right foot with swelling greater than the left side.  Ulceration to the proximal nail fold on the great toe with necrotic appearing, macerated tissue and serous drainage.  Exquisitely tender to the touch.  There is macerated white tissue in between each toe consistent with mycotic infection.  There is a shallow stage II ulcer over the lateral foot with serous drainage, no active purulence.  Tender to palpation.  ?Skin: ?   General: Skin is warm and dry.  ?Neurological:  ?   Mental Status: He is alert.  ?Psychiatric:     ?   Behavior: Behavior normal.  ? ? ?ED Results / Procedures / Treatments   ?Labs ?(all labs ordered are listed, but only abnormal results are displayed) ?Labs Reviewed  ?CBC WITH DIFFERENTIAL/PLATELET - Abnormal; Notable for the following components:  ?    Result Value  ? RBC 3.48 (*)   ? Hemoglobin 11.2 (*)   ? HCT 33.7 (*)   ? All other components within normal limits  ?LACTIC ACID, PLASMA - Abnormal; Notable for the following components:  ? Lactic Acid, Venous 2.7 (*)   ? All other components within normal limits  ?LACTIC ACID, PLASMA - Abnormal; Notable for the following components:  ? Lactic Acid, Venous 2.9 (*)   ? All other components within normal limits  ?BASIC METABOLIC PANEL - Abnormal; Notable for the following components:  ? Sodium 130 (*)   ? Chloride 92 (*)   ? Glucose, Bld 102 (*)   ? All other components within normal limits  ?AEROBIC CULTURE W GRAM STAIN (SUPERFICIAL SPECIMEN)  ? ? ?EKG ?None ? ?Radiology ?DG Foot Complete Right ? ?Result Date: 03/09/2022 ?CLINICAL DATA:  Foot ulcers EXAM: RIGHT FOOT COMPLETE - 3+ VIEW COMPARISON:  01/04/2022 FINDINGS: Frontal, oblique, lateral views of the right foot are obtained. Bones are osteopenic. No acute fracture, subluxation, or dislocation. No bony destruction or  periosteal reaction to suggest osteomyelitis. There is diffuse soft tissue swelling, most pronounced within the dorsum of the midfoot and forefoot. No evidence of subcutaneous gas or radiopaque foreign body. IMPRESSION: 1. Soft tissue swelling. 2. No radiographic evidence of osteomyelitis. Electronically Signed   By: Randa Ngo M.D.   On: 03/09/2022 22:41   ? ?Procedures ?Procedures  ? ? ?Medications Ordered in ED ?Medications  ?oxyCODONE-acetaminophen (PERCOCET/ROXICET) 5-325 MG per tablet 1 tablet (1 tablet Oral Given 03/09/22 1854)  ?oxyCODONE-acetaminophen (PERCOCET/ROXICET)  5-325 MG per tablet 1 tablet (1 tablet Oral Given 03/09/22 2220)  ?cephALEXin (KEFLEX) capsule 1,000 mg (1,000 mg Oral Given 03/09/22 2356)  ? ? ?ED Course/ Medical Decision Making/ A&P ?Clinical Course as of 03/10/22 2135  ?Tue Mar 09, 2022  ?2149 Lactic acid, plasma [AH]  ?[id number] Basic metabolic panel(!) ?Mild elevated glucose [AH]  ?2302 CBC with Differential(!) ?CBC without significant abnormality, no leukocytosis [AH]  ?2302 Lactic acid, plasma(!!) ?Lactic acid is slightly elevated.  Patient just began taking diuretics.  I think this is likely the source. [AH]  ?  ?Clinical Course User Index ?[AH] Margarita Mail, PA-C  ? ?                        ?Medical Decision Making ?This is a 61 year old male with foot ulceration.  Differential diagnosis includes osteomyelitis, pressure ulcer, mycotic infection, cellulitis, peripheral vascular disease.  I ordered and reviewed labs.  Patient's work-up is reassuring.  I visualized a foot x-ray which shows no evidence of bony degradation consistent with osteomyelitis.  Patient's wounds dressed here in the emergency department.  He was given postop shoe and crutches to remove weight on the foot.  He is already currently taking pain medications.  He appears to have tinea pedis and I have ordered ketoconazole.  Patient will be best served in outpatient follow-up with podiatry.  He agrees with this plan  of care and appears appropriate for discharge at this time.  Pain is improved after pain medications. ? ?Amount and/or Complexity of Data Reviewed ?Labs:  Decision-making details documented in ED Course. ?Radiology: order

## 2022-03-09 NOTE — ED Provider Triage Note (Signed)
Emergency Medicine Provider Triage Evaluation Note ? ?Sean Mccoy , a 61 y.o. male  was evaluated in triage.  Pt complains of wounds to the left foot for the last month which is not improving.  Was seen by PCP and given antibiotics without improvement in the area.  States it is very painful.  Only improved with soaks at home. ? ?Review of Systems  ?Positive: Painful wound to the right foot ?Negative: Fevers, chills, nausea, vomiting ? ?Physical Exam  ?BP (!) 141/81 (BP Location: Right Arm)   Pulse (!) 105   Temp 99 ?F (37.2 ?C) (Oral)   Resp 18   Ht 5\' 10"  (1.778 m)   Wt 77.1 kg   SpO2 94%   BMI 24.39 kg/m?  ?Gen:   Awake, no distress   ?Resp:  Normal effort  ?MSK:   Moves extremities without difficulty  ?Other:  Wound to the lateral right foot and dorsum of the right great toe.  Normal capillary refill in all digits.  1+ pedal pulses on the right foot. ? ?Medical Decision Making  ?Medically screening exam initiated at 6:33 PM.  Appropriate orders placed.  Sean Mccoy was informed that the remainder of the evaluation will be completed by another provider, this initial triage assessment does not replace that evaluation, and the importance of remaining in the ED until their evaluation is complete. ? ?This chart was dictated using voice recognition software, Dragon. Despite the best efforts of this provider to proofread and correct errors, errors may still occur which can change documentation meaning. ? ?  ?Paris Lore, PA-C ?03/09/22 1839 ? ?

## 2022-03-09 NOTE — ED Notes (Signed)
Date and time results received: 03/09/22  ? ? ?Test: lactic ?Critical Value: 2.7 ? ?Name of Provider Notified: tegeler ? ?

## 2022-03-09 NOTE — Discharge Instructions (Signed)
Contact a health care provider if: ?You have: ?A fever or chills. ?Pain that is not helped by medicine. ?Any changes in skin color. ?New blisters or sores. ?Pus or a bad smell coming from your wound. ?Redness, swelling, or pain around your wound. ?More fluid or blood coming from your wound. ?Your wound does not improve after 1-2 weeks of treatment. ?

## 2022-03-09 NOTE — ED Triage Notes (Signed)
Pt arrived via POV for a foot ulcer on right foot for over a month that is "not getting any better." Pt has a wound on right outer foot that measure 3cmx5cm. The bedding of wound is pink and has small amount of serous drainage. Pt states the wound is very painful. Pt has 2+ right radial pulse, cap refill less than 3 sec, warm to touch, sensation intact. Pt's LE 2+ swelling bilat.   ?

## 2022-03-12 LAB — AEROBIC CULTURE W GRAM STAIN (SUPERFICIAL SPECIMEN): Gram Stain: NONE SEEN

## 2022-03-13 ENCOUNTER — Telehealth: Payer: Self-pay

## 2022-03-13 NOTE — Telephone Encounter (Signed)
Post ED Visit - Positive Culture Follow-up ? ?Culture report reviewed by antimicrobial stewardship pharmacist: ?[facility] ?[x]  Daylene Posey, Pharm.D. ?[]  Celedonio Miyamoto, Pharm.D., BCPS AQ-ID ?[]  Garvin Fila, Pharm.D., BCPS ?[]  Georgina Pillion, [address].D., BCPS ?[]  [address], [address].D., BCPS, AAHIVP ?[]  Estella Husk, Pharm.D., BCPS, AAHIVP ?[]  Lysle Pearl, PharmD, BCPS ?[]  Phillips Climes, PharmD, BCPS ?[]  Agapito Games, PharmD, BCPS ?[]  Verlan Friends, PharmD ?[]  Mervyn Gay, PharmD, BCPS ?[]  Vinnie Level, PharmD ? ?[facility] ?[]  Len Childs, PharmD ?[]  Greer Pickerel, PharmD ?[]  Adalberto Cole, PharmD ?[]  Perlie Gold, Rph ?[]  Lonell Face) Jean Rosenthal, PharmD ?[]  Earl Many, PharmD ?[]  Junita Push, PharmD ?[]  Dorna Leitz, PharmD ?[]  Terrilee Files, PharmD ?[]  Lynann Beaver, PharmD ?[]  Keturah Barre, PharmD ?[]  Loralee Pacas, PharmD ?[]  Bernadene Person, PharmD ? ? ?Positive wound culture ?Treated with Cephalexin, organism sensitive to the same and no further patient follow-up is required at this time. ? ?Sandria Senter ?03/13/2022, 12:00 PM ?  ?

## 2022-05-27 ENCOUNTER — Other Ambulatory Visit: Payer: Self-pay

## 2022-05-27 ENCOUNTER — Emergency Department (HOSPITAL_COMMUNITY)
Admission: EM | Admit: 2022-05-27 | Discharge: 2022-05-27 | Disposition: A | Discharge status: Home / Self Care | Payer: Medicaid Other | Attending: Emergency Medicine | Admitting: Emergency Medicine

## 2022-05-27 ENCOUNTER — Encounter (HOSPITAL_COMMUNITY): Payer: Self-pay

## 2022-05-27 DIAGNOSIS — R21 Rash and other nonspecific skin eruption: Secondary | ICD-10-CM | POA: Diagnosis present

## 2022-05-27 DIAGNOSIS — I10 Essential (primary) hypertension: Secondary | ICD-10-CM | POA: Diagnosis not present

## 2022-05-27 DIAGNOSIS — L309 Dermatitis, unspecified: Secondary | ICD-10-CM | POA: Insufficient documentation

## 2022-05-27 DIAGNOSIS — L03119 Cellulitis of unspecified part of limb: Secondary | ICD-10-CM

## 2022-05-27 DIAGNOSIS — L03115 Cellulitis of right lower limb: Secondary | ICD-10-CM | POA: Insufficient documentation

## 2022-05-27 LAB — CBC
HCT: 33.6 % — ABNORMAL LOW (ref 39.0–52.0)
Hemoglobin: 11.4 g/dL — ABNORMAL LOW (ref 13.0–17.0)
MCH: 32.2 pg (ref 26.0–34.0)
MCHC: 33.9 g/dL (ref 30.0–36.0)
MCV: 94.9 fL (ref 80.0–100.0)
Platelets: 218 10*3/uL (ref 150–400)
RBC: 3.54 MIL/uL — ABNORMAL LOW (ref 4.22–5.81)
RDW: 13.7 % (ref 11.5–15.5)
WBC: 6 10*3/uL (ref 4.0–10.5)
nRBC: 0 % (ref 0.0–0.2)

## 2022-05-27 LAB — BASIC METABOLIC PANEL
Anion gap: 19 — ABNORMAL HIGH (ref 5–15)
BUN: 9 mg/dL (ref 6–20)
CO2: 18 mmol/L — ABNORMAL LOW (ref 22–32)
Calcium: 9.6 mg/dL (ref 8.9–10.3)
Chloride: 92 mmol/L — ABNORMAL LOW (ref 98–111)
Creatinine, Ser: 0.78 mg/dL (ref 0.61–1.24)
GFR, Estimated: >60 mL/min (ref >60)
Glucose, Bld: 104 mg/dL — ABNORMAL HIGH (ref 70–99)
Potassium: 4.2 mmol/L (ref 3.5–5.1)
Sodium: 129 mmol/L — ABNORMAL LOW (ref 135–145)

## 2022-05-27 MED ORDER — SODIUM CHLORIDE 0.9 % IV BOLUS
1000.0000 mL | Freq: Once | INTRAVENOUS | Status: AC
Start: 2022-05-27 — End: 2022-05-27
  Administered 2022-05-27: 1000 mL via INTRAVENOUS

## 2022-05-27 MED ORDER — HYDROCORTISONE 2.5 % EX LOTN: TOPICAL_LOTION | Freq: Two times a day (BID) | CUTANEOUS | 0 refills | Status: DC

## 2022-05-27 MED ORDER — CEPHALEXIN 250 MG PO CAPS
500.0000 mg | ORAL_CAPSULE | Freq: Once | ORAL | Status: AC
Start: 2022-05-27 — End: 2022-05-27
  Administered 2022-05-27: 500 mg via ORAL
  Filled 2022-05-27: qty 2

## 2022-05-27 MED ORDER — MORPHINE SULFATE (PF) 4 MG/ML IV SOLN
4.0000 mg | Freq: Once | INTRAVENOUS | Status: AC
  Administered 2022-05-27: 4 mg via INTRAVENOUS
  Filled 2022-05-27: qty 1

## 2022-05-27 MED ORDER — CEPHALEXIN 500 MG PO CAPS: 500.0000 mg | ORAL_CAPSULE | Freq: Three times a day (TID) | ORAL | 0 refills | Status: DC

## 2022-05-27 MED ORDER — HYDROCODONE-ACETAMINOPHEN 5-325 MG PO TABS
1.0000 | ORAL_TABLET | Freq: Once | ORAL | Status: AC
  Administered 2022-05-27: 1 via ORAL
  Filled 2022-05-27: qty 1

## 2022-05-27 MED ORDER — HYDROCORTISONE 1 % EX CREA
TOPICAL_CREAM | Freq: Once | CUTANEOUS | Status: AC
  Filled 2022-05-27: qty 28

## 2022-05-27 MED ORDER — DIPHENHYDRAMINE HCL 50 MG/ML IJ SOLN
25.0000 mg | Freq: Once | INTRAMUSCULAR | Status: AC
  Administered 2022-05-27: 25 mg via INTRAVENOUS
  Filled 2022-05-27: qty 1

## 2022-05-27 NOTE — Discharge Instructions (Addendum)
You have foot cellulitis.  Please take Keflex 500 mg 3 times daily for a week.  Please see podiatrist for follow-up  You also have significant eczema.  Please try hydrocortisone lotion to the face twice daily.  See your primary care doctor as well  Stay hydrated.  You need repeat chemistry in 1 week  Return to ER if you have worse foot pain, worse rash in the face and neck

## 2022-05-27 NOTE — ED Provider Triage Note (Signed)
Emergency Medicine Provider Triage Evaluation Note  Sean Mccoy , a 61 y.o. male  was evaluated in triage.  Pt complains of rash to neck and ears as well as worsening left diabetic foot ulcer.  Patient reports that the rash on his neck and ears developed 3 days ago, is itchy.  Patient denies any new detergents, foods, medications.  Patient denies any airway involvement, lip or tongue swelling.  Patient has been putting baking soda, hydroperoxide and alcohol onto the areas.  Patient also complaining of worsening left great toe diabetic foot ulcer.  Patient states that he was seen in the ER in March, given antibiotics and advised to follow-up with podiatry.  Patient states he never followed up with podiatry.  Patient denies any fevers, nausea, vomiting.  Review of Systems  Positive:  Negative:   Physical Exam  BP (!) 147/73 (BP Location: Right Arm)   Pulse (!) 109   Temp 98.8 F (37.1 C) (Oral)   Resp 16   Ht 5\' 10"  (1.778 m)   Wt 76.7 kg   SpO2 95%   BMI 24.25 kg/m  Gen:   Awake, no distress   Resp:  Normal effort  MSK:   Moves extremities without difficulty  Other:    Medical Decision Making  Medically screening exam initiated at 5:07 PM.  Appropriate orders placed.  Sean Mccoy was informed that the remainder of the evaluation will be completed by another provider, this initial triage assessment does not replace that evaluation, and the importance of remaining in the ED until their evaluation is complete.     Al DecantGroce, Lela Murfin F, PA-C 05/27/22 1707

## 2022-05-27 NOTE — ED Triage Notes (Addendum)
Rash and swelling noted to right eye, left ear and scalp.  Patient reports his pain medicatoin makes him itch  Patient also wants to be seen for ulcers on right foot and bilateral leg swelling. Reports he was on antbiotics 2 months ago for the ulcers and it got better but now has got bad again.

## 2022-05-27 NOTE — ED Provider Notes (Signed)
[name] EMERGENCY DEPARTMENT Provider Note   CSN: [id number] Arrival date & time: 05/27/22  1623     History  Chief Complaint  Patient presents with   Rash    Sean Mccoy is a 61 y.o. male history of right foot ulcer, rheumatoid arthritis, hypertension here presenting with rash on the face as well as pain in the right foot.  Patient states that he gets chronic itchiness from taking his meds.  He has no recent medication changes.  He states that he may have eczema but he is not sure about that particular diagnosis. He states that he noticed worsening rash in his face and neck.  He also recently put too much pressure on the right leg and noticed worsening pain in the right leg.  He was seen here in March and was prescribed Keflex and the ulcer almost cleared up.  He followed up with Dr. Allena Katz from podiatry previously but has not seen podiatry recently.  The history is provided by the patient.      Home Medications Prior to Admission medications   Medication Sig Start Date End Date Taking? Authorizing Provider  acetaminophen (TYLENOL) 500 MG tablet Take 1 tablet (500 mg total) by mouth every 6 (six) hours as needed. Patient not taking: Reported on 01/04/2022 11/08/19   Emi Holes, PA-C  bismuth subsalicylate (PEPTO BISMOL) 262 MG chewable tablet Chew 524 mg by mouth as needed. Patient not taking: Reported on 01/04/2022    [provider]  cephALEXin (KEFLEX) 500 MG capsule 2 caps po bid x 7 days 03/09/22   Arthor Captain, PA-C  dicyclomine (BENTYL) 20 MG tablet Take 1 tablet (20 mg total) by mouth 2 (two) times daily. Patient not taking: Reported on 01/04/2022 03/29/20   Arthor Captain, PA-C  esomeprazole (NEXIUM) 40 MG capsule Take 1 capsule (40 mg total) by mouth daily. Patient not taking: Reported on 01/04/2022 03/29/20   Arthor Captain, PA-C  ketoconazole (NIZORAL) 2 % cream Apply 1 application. topically daily. 03/09/22   Arthor Captain, PA-C   metoCLOPramide (REGLAN) 10 MG tablet Take 1 tablet (10 mg total) by mouth every 6 (six) hours as needed for nausea (nausea/headache). Patient not taking: Reported on 01/04/2022 03/29/20   Arthor Captain, PA-C  naproxen (NAPROSYN) 500 MG tablet Take 1 tablet (500 mg total) by mouth 2 (two) times daily. Patient not taking: Reported on 01/04/2022 11/08/19   Emi Holes, PA-C  ondansetron (ZOFRAN) 4 MG tablet Take 1 tablet (4 mg total) by mouth every 6 (six) hours. Patient not taking: Reported on 01/04/2022 12/10/19   Vanetta Mulders, MD  oxyCODONE (OXY IR/ROXICODONE) 5 MG immediate release tablet Take 1-2 tablets (5-10 mg total) by mouth every 6 (six) hours as needed for moderate pain. Patient not taking: Reported on 01/04/2022 11/09/18   Barnetta Chapel, PA-C  predniSONE (DELTASONE) 10 MG tablet Take 4 tablets (40 mg total) by mouth daily. Patient not taking: Reported on 01/04/2022 12/10/19   Vanetta Mulders, MD      Allergies    Patient has no known allergies.    Review of Systems   Review of Systems  Musculoskeletal:        Right foot pain   Skin:  Positive for rash.  All other systems reviewed and are negative.  Physical Exam Updated Vital Signs BP (!) 147/73 (BP Location: Right Arm)   Pulse (!) 109   Temp 98.8 F (37.1 C) (Oral)   Resp 16   Ht 5'  10" (1.778 m)   Wt 76.7 kg   SpO2 95%   BMI 24.25 kg/m  Physical Exam Vitals and nursing note reviewed.  Constitutional:      Comments: Scratching his face  HENT:     Head: Normocephalic.     Mouth/Throat:     Mouth: Mucous membranes are dry.  Eyes:     Comments: Patient has some erythema on his face. It is scaly in appearance. It involves bilateral ears. Not involving the mucous membrane   Neck:     Comments: Patient appears to have eczema on the neck as well Cardiovascular:     Rate and Rhythm: Normal rate and regular rhythm.     Pulses: Normal pulses.     Heart sounds: Normal heart sounds.  Pulmonary:     Effort:  Pulmonary effort is normal.     Breath sounds: Normal breath sounds.  Abdominal:     General: Abdomen is flat.     Palpations: Abdomen is soft.  Musculoskeletal:     Cervical back: Normal range of motion.     Comments: Right foot with some erythema on the lateral aspect of the foot.  No obvious ulcer and no obvious purulent drainage.  2+ DP pulse  Skin:    General: Skin is warm.     Capillary Refill: Capillary refill takes less than 2 seconds.     Findings: Erythema present.  Neurological:     General: No focal deficit present.     Mental Status: He is alert and oriented to person, place, and time.  Psychiatric:        Mood and Affect: Mood normal.        Behavior: Behavior normal.     ED Results / Procedures / Treatments   Labs (all labs ordered are listed, but only abnormal results are displayed) Labs Reviewed  CBC - Abnormal; Notable for the following components:      Result Value   RBC 3.54 (*)    Hemoglobin 11.4 (*)    HCT 33.6 (*)    All other components within normal limits  BASIC METABOLIC PANEL - Abnormal; Notable for the following components:   Sodium 129 (*)    Chloride 92 (*)    CO2 18 (*)    Glucose, Bld 104 (*)    Anion gap 19 (*)    All other components within normal limits    EKG None  Radiology No results found.  Procedures Procedures    Medications Ordered in ED Medications  cephALEXin (KEFLEX) capsule 500 mg (has no administration in time range)  sodium chloride 0.9 % bolus 1,000 mL (has no administration in time range)  diphenhydrAMINE (BENADRYL) injection 25 mg (has no administration in time range)  morphine (PF) 4 MG/ML injection 4 mg (has no administration in time range)  HYDROcodone-acetaminophen (NORCO/VICODIN) 5-325 MG per tablet 1 tablet (1 tablet Oral Given 05/27/22 1715)    ED Course/ Medical Decision Making/ A&P                           Medical Decision Making Elliot Gaultsaac Verno is a 61 y.o. male here presenting with rash on the  face as well as right foot cellulitis.  Patient likely has chronic eczema.  He states that he had rash previously but has been getting worse.  It does not involve mucous membranes.  I do not think he has Stevens-Johnson syndrome.  I think likely worsening  eczema.  At this point I think giving him hydrocortisone cream will be the most beneficial and I would avoid systemic steroids given his history of right foot ulcer.  He also has cellulitis of the right foot.  We will give Keflex.  Plan to check lab work since patient appears dehydrated.   8:44 PM I reviewed patient's labs.  Patient has a sodium of 129 and bicarb of 18.  His anion gap is 19. Patient states that he has poor appetite.  I think dehydration is causing his anion gap and low bicarb.  Patient was given a liter bolus in the ED.  Also given Benadryl for itchiness.  He was also given Keflex for right foot cellulitis.  We will discharge patient home with hydrocortisone cream and Keflex and podiatry follow-up  Problems Addressed: Cellulitis of foot: acute illness or injury Eczema, unspecified type: acute illness or injury  Amount and/or Complexity of Data Reviewed Labs: ordered. Decision-making details documented in ED Course.  Risk Prescription drug management.    Final Clinical Impression(s) / ED Diagnoses Final diagnoses:  None    Rx / DC Orders ED Discharge Orders     None         Charlynne Pander, MD 05/27/22 2051

## 2022-06-23 ENCOUNTER — Ambulatory Visit (INDEPENDENT_AMBULATORY_CARE_PROVIDER_SITE_OTHER): Payer: Medicaid Other | Admitting: Podiatry

## 2022-06-23 DIAGNOSIS — L97312 Non-pressure chronic ulcer of right ankle with fat layer exposed: Secondary | ICD-10-CM

## 2022-06-30 NOTE — Progress Notes (Signed)
Subjective:  Patient ID: Sean Mccoy, male    DOB: Aug 02, 1961,  MRN: [id number]  Chief Complaint  Patient presents with   Wound Check    61 y.o. male presents for wound care.  Patient presents with right lateral ankle wound with fat layer exposed.  4 months has progressed gotten worse.  Patient presents with right ankle wound that has reopened back up again.  He has lymphedema as well.  He wanted get it evaluated he denies any other acute complaints.   Review of Systems: Negative except as noted in the HPI. Denies N/V/F/Ch.  Past Medical History:  Diagnosis Date   Abrasion of face 11/06/2018   Anxiety    COVID-19    Depression    Enlarged prostate    HTN (hypertension) 05/04/2019   Orbital floor fracture (HCC) 11/06/2018   right   PTSD (post-traumatic stress disorder)    Rheumatoid arthritis (HCC)     Current Outpatient Medications:    acetaminophen (TYLENOL) 500 MG tablet, Take 1 tablet (500 mg total) by mouth every 6 (six) hours as needed. (Patient not taking: Reported on 01/04/2022), Disp: 30 tablet, Rfl: 0   bismuth subsalicylate (PEPTO BISMOL) 262 MG chewable tablet, Chew 524 mg by mouth as needed. (Patient not taking: Reported on 01/04/2022), Disp: , Rfl:    cephALEXin (KEFLEX) 500 MG capsule, Take 1 capsule (500 mg total) by mouth 3 (three) times daily., Disp: 21 capsule, Rfl: 0   dicyclomine (BENTYL) 20 MG tablet, Take 1 tablet (20 mg total) by mouth 2 (two) times daily. (Patient not taking: Reported on 01/04/2022), Disp: 20 tablet, Rfl: 0   esomeprazole (NEXIUM) 40 MG capsule, Take 1 capsule (40 mg total) by mouth daily. (Patient not taking: Reported on 01/04/2022), Disp: 30 capsule, Rfl: 0   hydrocortisone 2.5 % lotion, Apply topically 2 (two) times daily., Disp: 59 mL, Rfl: 0   ketoconazole (NIZORAL) 2 % cream, Apply 1 application. topically daily., Disp: 30 g, Rfl: 0   metoCLOPramide (REGLAN) 10 MG tablet, Take 1 tablet (10 mg total) by mouth every 6 (six) hours as needed for  nausea (nausea/headache). (Patient not taking: Reported on 01/04/2022), Disp: 20 tablet, Rfl: 0   naproxen (NAPROSYN) 500 MG tablet, Take 1 tablet (500 mg total) by mouth 2 (two) times daily. (Patient not taking: Reported on 01/04/2022), Disp: 30 tablet, Rfl: 0   ondansetron (ZOFRAN) 4 MG tablet, Take 1 tablet (4 mg total) by mouth every 6 (six) hours. (Patient not taking: Reported on 01/04/2022), Disp: 12 tablet, Rfl: 0   oxyCODONE (OXY IR/ROXICODONE) 5 MG immediate release tablet, Take 1-2 tablets (5-10 mg total) by mouth every 6 (six) hours as needed for moderate pain. (Patient not taking: Reported on 01/04/2022), Disp: 20 tablet, Rfl: 0   predniSONE (DELTASONE) 10 MG tablet, Take 4 tablets (40 mg total) by mouth daily. (Patient not taking: Reported on 01/04/2022), Disp: 20 tablet, Rfl: 0  Social History   Tobacco Use  Smoking Status Never  Smokeless Tobacco Never    No Known Allergies Objective:  There were no vitals filed for this visit. There is no height or weight on file to calculate BMI. Constitutional Well developed. Well nourished.  Vascular Dorsalis pedis pulses faintly palpable bilaterally. Posterior tibial pulses faintly palpable bilaterally. Capillary refill diminished to all digits.  No cyanosis or clubbing noted. Pedal hair growth normal.  Neurologic Normal speech. Oriented to person, place, and time. Protective sensation absent  Dermatologic Wound Location: Right lateral ankle wound with fat  layer exposed.  Fibrogranular wound bed noted.  Arterial/venous stasis ulceration noted. Wound Base: Mixed Granular/Fibrotic Peri-wound: Macerated Exudate: Scant/small amount Serosanguinous exudate Wound Measurements: -See below  Orthopedic: No pain to palpation either foot.   Radiographs: None Assessment:   1. Ankle ulcer, right, with fat layer exposed (HCC)    Plan:  Patient was evaluated and treated and all questions answered.  Ulcer right lateral ankle ulceration with fat  layer exposed secondary to underlying venous stasis -Debridement as below. -Dressed with Betadine wet-to-dry, DSD. -Continue off-loading with surgical shoe. -I will refer him to the wound care center for advanced wound care.  Procedure: Excisional Debridement of Wound Tool: Sharp chisel blade/tissue nipper Rationale: Removal of non-viable soft tissue from the wound to promote healing.  Anesthesia: none Pre-Debridement Wound Measurements: 1.5 cm x 1.3 cm x 0.3 cm  Post-Debridement Wound Measurements: 1.7 cm x 1.4 cm x 0.3 cm  Type of Debridement: Sharp Excisional Tissue Removed: Non-viable soft tissue Blood loss: Minimal (<50cc) Depth of Debridement: subcutaneous tissue. Technique: Sharp excisional debridement to bleeding, viable wound base.  Wound Progress: This is my initial evaluation of continue to monitor the progression of the wound Site healing conversation 7 Dressing: Dry, sterile, compression dressing. Disposition: Patient tolerated procedure well. Patient to return in 1 week for follow-up.  No follow-ups on file.

## 2022-07-09 NOTE — Progress Notes (Signed)
RAMADAN, COUEY ([id number]) Visit Report for 07/12/2022 Allergy List Details Patient Name: Date of Service: Sean Mccoy 07/12/2022 1:15 PM Medical Record Number: [id number] Patient Account Number: [id number] Date of Birth/Sex: Treating RN: 1961/06/24 (61 y.o. [name] Primary Care Raj Landress: Other Clinician: Referring Arletta Lumadue: Treating Dilan Fullenwider/Extender: Tilda Franco in Treatment: 0 Allergies Active Allergies No Known Allergies Allergy Notes Electronic Signature(s) Signed: 07/09/2022 10:10:01 AM By: Antonieta Iba Entered By: Antonieta Iba on 07/09/2022 10:10:01

## 2022-07-12 ENCOUNTER — Encounter (HOSPITAL_BASED_OUTPATIENT_CLINIC_OR_DEPARTMENT_OTHER): Payer: Medicaid Other | Attending: Internal Medicine | Admitting: Internal Medicine

## 2022-07-12 DIAGNOSIS — L97512 Non-pressure chronic ulcer of other part of right foot with fat layer exposed: Secondary | ICD-10-CM | POA: Diagnosis present

## 2022-07-12 DIAGNOSIS — M069 Rheumatoid arthritis, unspecified: Secondary | ICD-10-CM | POA: Diagnosis not present

## 2022-07-12 DIAGNOSIS — I890 Lymphedema, not elsewhere classified: Secondary | ICD-10-CM | POA: Diagnosis not present

## 2022-07-12 DIAGNOSIS — I10 Essential (primary) hypertension: Secondary | ICD-10-CM | POA: Diagnosis not present

## 2022-07-12 DIAGNOSIS — I87311 Chronic venous hypertension (idiopathic) with ulcer of right lower extremity: Secondary | ICD-10-CM | POA: Diagnosis not present

## 2022-07-12 DIAGNOSIS — Z96641 Presence of right artificial hip joint: Secondary | ICD-10-CM | POA: Insufficient documentation

## 2022-07-12 NOTE — Progress Notes (Signed)
Sean Mccoy, Sean Mccoy ([id number]) Visit Report for 07/12/2022 Abuse Risk Screen Details Patient Name: Date of Service: Sean Mccoy 07/12/2022 1:15 PM Medical Record Number: [id number] Patient Account Number: [id number] Date of Birth/Sex: Treating RN: [birth year] (61 y.o. Sean Mccoy Primary Care Sean Sean Mccoy Other Clinician: Referring Sean Mccoy: Treating Sean Mccoy/Extender: Sean Mccoy in Treatment: 0 Abuse Risk Screen Items Answer ABUSE RISK SCREEN: Has anyone close to you tried to hurt or harm you recentlyo No Do you feel uncomfortable with anyone in your familyo No Has anyone forced you do things that you didnt want to doo No Electronic Signature(s) Signed: 07/12/2022 3:44:44 PM By: Sean Mccoy Entered By: Sean Mccoy on 07/12/2022 13:46:03 -------------------------------------------------------------------------------- Activities of Daily Living Details Patient Name: Date of Service: Sean Mccoy 07/12/2022 1:15 PM Medical Record Number: [id number] Patient Account Number: [id number] Date of Birth/Sex: Treating RN: [birth year] (61 y.o. Sean Mccoy Primary Care Sean Mccoy: Sean Mccoy Other Clinician: Referring Sean Mccoy: Treating Sean Mccoy/Extender: Sean Mccoy in Treatment: 0 Activities of Daily Living Items Answer Activities of Daily Living (Please select one for each item) Drive Automobile Completely Able T Medications ake Completely Able Use T elephone Completely Able Care for Appearance Completely Able Use T oilet Completely Able Bath / Shower Completely Able Dress Self Completely Able Feed Self Completely Able Walk Completely Able Get In / Out Bed Completely Able Housework Completely Able Prepare Meals Completely Able Handle Money Completely Able Shop for Self Completely Able Electronic Signature(s) Signed: 07/12/2022 3:44:44 PM By: Sean Mccoy Entered By: Sean Mccoy on 07/12/2022  13:46:29 -------------------------------------------------------------------------------- Education Screening Details Patient Name: Date of Service: Sean Mccoy 07/12/2022 1:15 PM Medical Record Number: [id number] Patient Account Number: [id number] Date of Birth/Sex: Treating RN: Aug 23, 1961 (61 y.o. Sean Mccoy Primary Care Kayslee Furey: Sean Mccoy Other Clinician: Referring Sean Mccoy: Treating Sean Mccoy/Extender: Sean Mccoy in Treatment: 0 Primary Learner Assessed: Patient Learning Preferences/Education Level/Primary Language Learning Preference: Explanation, Demonstration, Printed Material Highest Education Level: High School Preferred Language: English Cognitive Barrier Language Barrier: No Translator Needed: No Memory Deficit: No Emotional Barrier: No Cultural/Religious Beliefs Affecting Medical Care: No Physical Barrier Impaired Vision: Yes Glasses Impaired Hearing: No Decreased Hand dexterity: No Knowledge/Comprehension Knowledge Level: High Comprehension Level: High Ability to understand written instructions: High Ability to understand verbal instructions: High Motivation Anxiety Level: Calm Cooperation: Cooperative Education Importance: Acknowledges Need Interest in Health Problems: Asks Questions Perception: Coherent Willingness to Engage in Self-Management High Activities: Readiness to Engage in Self-Management High Activities: Electronic Signature(s) Signed: 07/12/2022 3:44:44 PM By: Sean Mccoy Entered By: Sean Mccoy on 07/12/2022 13:46:57 -------------------------------------------------------------------------------- Fall Risk Assessment Details Patient Name: Date of Service: Sean Mccoy 07/12/2022 1:15 PM Medical Record Number: [id number] Patient Account Number: [id number] Date of Birth/Sex: Treating RN: 1961-01-26 (61 y.o. Sean Mccoy Primary Care Sean Mccoy: Sean Mccoy Other Clinician: Referring  Sean Mccoy: Treating Sean Mccoy/Extender: Sean Mccoy in Treatment: 0 Fall Risk Assessment Items Have you had 2 or more falls in the last 12 monthso 0 No Have you had any fall that resulted in injury in the last 12 monthso 0 No FALLS RISK SCREEN History of falling - immediate or within 3 months 0 No Secondary diagnosis (Do you have 2 or more medical diagnoseso) 15 Yes Ambulatory aid None/bed rest/wheelchair/nurse 0 Yes Crutches/cane/walker 0 No Furniture 0 No Intravenous therapy Access/Saline/Heparin Lock 0 No Gait/Transferring Normal/ bed rest/ wheelchair 0 Yes Weak (short steps with or without  shuffle, stooped but able to lift head while walking, may seek 0 No support from furniture) Impaired (short steps with shuffle, may have difficulty arising from chair, head down, impaired 0 No balance) Mental Status Oriented to own ability 0 Yes Electronic Signature(s) Signed: 07/12/2022 3:44:44 PM By: Sean Mccoy Entered By: Sean Mccoy on 07/12/2022 13:47:15 -------------------------------------------------------------------------------- Foot Assessment Details Patient Name: Date of Service: Sean Mccoy 07/12/2022 1:15 PM Medical Record Number: [id number] Patient Account Number: [id number] Date of Birth/Sex: Treating RN: June 12, 1961 (61 y.o. Sean Mccoy Primary Care Sean Mccoy: Sean Mccoy Other Clinician: Referring Sean Mccoy: Treating Sean Mccoy/Extender: Sean Mccoy in Treatment: 0 Foot Assessment Items Site Locations + = Sensation present, - = Sensation absent, Mccoy = Callus, U = Ulcer R = Redness, W = Warmth, M = Maceration, PU = Pre-ulcerative lesion F = Fissure, S = Swelling, D = Dryness Assessment Right: Left: Other Deformity: No No Prior Foot Ulcer: No No Prior Amputation: No No Charcot Joint: No No Ambulatory Status: Ambulatory Without Help Gait: Steady Electronic Signature(s) Signed: 07/12/2022 3:44:44 PM By:  Sean Mccoy Entered By: Sean Mccoy on 07/12/2022 13:57:35 -------------------------------------------------------------------------------- Nutrition Risk Screening Details Patient Name: Date of Service: Sean Mccoy 07/12/2022 1:15 PM Medical Record Number: [id number] Patient Account Number: [id number] Date of Birth/Sex: Treating RN: Sep 05, 1961 (61 y.o. Sean Mccoy Primary Care Hildy Nicholl: Sean Mccoy Other Clinician: Referring Mayer Vondrak: Treating Zayan Delvecchio/Extender: Sean Mccoy in Treatment: 0 Height (in): 70 Weight (lbs): 218 Body Mass Index (BMI): 31.3 Nutrition Risk Screening Items Score Screening NUTRITION RISK SCREEN: I have an illness or condition that made me change the kind and/or amount of food I eat 0 No I eat fewer than two meals per day 0 No I eat few fruits and vegetables, or milk products 0 No I have three or more drinks of beer, liquor or wine almost every day 0 No I have tooth or mouth problems that make it hard for me to eat 0 No I don't always have enough money to buy the food I need 0 No I eat alone most of the time 0 No I take three or more different prescribed or over-the-counter drugs a day 1 Yes Without wanting to, I have lost or gained 10 pounds in the last six months 0 No I am not always physically able to shop, cook and/or feed myself 0 No Nutrition Protocols Good Risk Protocol 0 No interventions needed Moderate Risk Protocol High Risk Proctocol Risk Level: Good Risk Score: 1 Electronic Signature(s) Signed: 07/12/2022 3:44:44 PM By: Sean Mccoy Entered By: Sean Mccoy on 07/12/2022 13:47:25

## 2022-07-12 NOTE — Progress Notes (Signed)
LENFORD, BEDDOW ([id number]) Visit Report for 07/12/2022 Chief Complaint Document Details Patient Name: Date of Service: Sean Mccoy 07/12/2022 1:15 PM Medical Record Number: [id number] Patient Account Number: [id number] Date of Birth/Sex: Treating RN: 1961-07-14 (61 y.o. Sean Mccoy Primary Care Provider: Cari Caraway Other Clinician: Referring Provider: Treating Provider/Extender: Dyke Brackett in Treatment: 0 Information Obtained from: Patient Chief Complaint 07/12/2022; right ankle wound, Right second medial toe wound Electronic Signature(s) Signed: 07/12/2022 3:52:49 PM By: Geralyn Corwin DO Entered By: Geralyn Sean on 07/12/2022 14:53:29 -------------------------------------------------------------------------------- HPI Details Patient Name: Date of Service: Sean Mccoy, Sean Mccoy 07/12/2022 1:15 PM Medical Record Number: [id number] Patient Account Number: [id number] Date of Birth/Sex: Treating RN: Feb 09, 1961 (61 y.o. Sean Mccoy Primary Care Provider: Cari Caraway Other Clinician: Referring Provider: Treating Provider/Extender: Dyke Brackett in Treatment: 0 History of Present Illness HPI Description: Admission 07/12/2022 Mr. 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 This is a wound that waxes and wanes in healing. It started 10 months ago. He follows with Dr. Allena Mccoy, podiatry for this issue. He has been using Betadine wet-to-dry dressings. He has been on several courses of antibiotics for this issue including doxycycline and Keflex. He states that the wound has been stable for the past several months. He has lymphedema and owns compression stockings but is not currently using them.  He denies signs of infection. He also developed a second right toe wound several months ago. He has been using Betadine to this area as well. Electronic Signature(s) Signed: 07/12/2022 3:52:49 PM By: Geralyn Corwin DO Entered By: Geralyn Sean on 07/12/2022 14:55:13 -------------------------------------------------------------------------------- Physical Exam Details Patient Name: Date of Service: Sean Mccoy 07/12/2022 1:15 PM Medical Record Number: [id number] Patient Account Number: [id number] Date of Birth/Sex: Treating RN: 1961/09/01 (61 y.o. Sean Mccoy Primary Care Provider: Other Clinician: Cari Caraway Referring Provider: Treating Provider/Extender: Dyke Brackett in Treatment: 0 Constitutional respirations regular, non-labored and within target range for patient.. Cardiovascular 2+ dorsalis pedis/posterior tibialis pulses. Psychiatric pleasant and cooperative. Notes Right foot: T the lateral ankle there is an open wound with granulation tissue and fibrinous tissue. No signs of surrounding infection. Lymphedema skin o changes noted. non pitting edema to the knee. T the second right toe, medial aspect there is an open wound also with granulation tissue present. o Electronic Signature(s) Signed: 07/12/2022 3:52:49 PM By: Geralyn Corwin DO Entered By: Geralyn Sean on 07/12/2022 14:59:22 -------------------------------------------------------------------------------- Physician Orders Details Patient Name: Date of Service: Sean Mccoy 07/12/2022 1:15 PM Medical Record Number: [id number] Patient Account Number: [id number] Date of Birth/Sex: Treating RN: [birth year] (61 y.o. Sean Mccoy Primary Care Provider: Cari Caraway Other Clinician: Referring Provider: Treating Provider/Extender: Dyke Brackett in Treatment: 0 Verbal / Phone Orders: No Diagnosis Coding ICD-10 Coding Code Description [phone]  Non-pressure chronic ulcer of other part of right foot with fat layer exposed I87.311 Chronic venous hypertension (idiopathic) with ulcer of right lower extremity Z96.641 Presence of right artificial hip joint Follow-up Appointments ppointment in 1 week. - 07/19/22 @ 2:45pm with Dr. Mikey Mccoy and Sean Laity, RN (Room 7) Return A Bathing/ Shower/ Hygiene May shower with protection but do not get wound dressing(s) wet. Edema Control - Lymphedema /  SCD / Other Elevate legs to the level of the heart or above for 30 minutes daily and/or when sitting, a frequency of: - several times a day Avoid standing for long periods of time. Additional Orders / Instructions Follow Nutritious Diet Wound Treatment Wound #1 - Foot Wound Laterality: Right, Lateral Cleanser: Soap and Water 1 x Per Week/30 Days Discharge Instructions: May shower and wash wound with dial antibacterial soap and water prior to dressing change. Cleanser: Wound Cleanser 1 x Per Week/30 Days Discharge Instructions: Cleanse the wound with wound cleanser prior to applying a clean dressing using gauze sponges, not tissue or cotton balls. Peri-Wound Care: Sween Lotion (Moisturizing lotion) 1 x Per Week/30 Days Discharge Instructions: Apply moisturizing lotion as directed Topical: Gentamicin 1 x Per Week/30 Days Discharge Instructions: As directed by physician Topical: Mupirocin Ointment 1 x Per Week/30 Days Discharge Instructions: Apply Mupirocin (Bactroban) as instructed Prim Dressing: PolyMem Silver Non-Adhesive Dressing, 4.25x4.25 in 1 x Per Week/30 Days ary Discharge Instructions: Apply to wound bed as instructed Secondary Dressing: ABD Pad, 8x10 1 x Per Week/30 Days Discharge Instructions: Apply over primary dressing as directed. Compression Wrap: Kerlix Roll 4.5x3.1 (in/yd) 1 x Per Week/30 Days Discharge Instructions: Apply Kerlix and Coban compression as directed. Compression Wrap: Coban Self-Adherent Wrap 4x5 (in/yd) 1 x Per Week/30  Days Discharge Instructions: Apply over Kerlix as directed. Wound #2 - T Second oe Wound Laterality: Right Cleanser: Soap and Water 1 x Per Day/30 Days Discharge Instructions: May wash wound with Dial antibacterial soap and water prior to dressing change. Topical: Mupirocin Ointment 1 x Per Day/30 Days Discharge Instructions: Apply Mupirocin (Bactroban) as instructed Secondary Dressing: Woven Gauze Sponges 2x2 in 1 x Per Day/30 Days Discharge Instructions: Apply over primary dressing as directed. Secured With: Insurance underwriter, Sterile 2x75 (in/in) 1 x Per Day/30 Days Discharge Instructions: Secure with stretch gauze as directed. Secured With: 66M Medipore H Soft Cloth Surgical T ape, 4 x 10 (in/yd) 1 x Per Day/30 Days Discharge Instructions: Secure with tape as directed. Electronic Signature(s) Signed: 07/12/2022 3:52:49 PM By: Geralyn Corwin DO Entered By: Geralyn Sean on 07/12/2022 14:59:30 -------------------------------------------------------------------------------- Problem List Details Patient Name: Date of Service: Sean Mccoy 07/12/2022 1:15 PM Medical Record Number: [id number] Patient Account Number: [id number] Date of Birth/Sex: Treating RN: [birth year] (61 y.o. Sean Mccoy Primary Care Provider: Cari Caraway Other Clinician: Referring Provider: Treating Provider/Extender: Dyke Brackett in Treatment: 0 Active Problems ICD-10 Encounter Code Description Active Date MDM Diagnosis [phone] Non-pressure chronic ulcer of other part of right foot with fat layer exposed 07/12/2022 No Yes I87.311 Chronic venous hypertension (idiopathic) with ulcer of right lower extremity 07/12/2022 No Yes Z96.641 Presence of right artificial hip joint 07/12/2022 No Yes Inactive Problems Resolved Problems Electronic Signature(s) Signed: 07/12/2022 3:52:49 PM By: Geralyn Corwin DO Entered By: Geralyn Sean on 07/12/2022  14:53:10 -------------------------------------------------------------------------------- Progress Note Details Patient Name: Date of Service: Marcia Brash A Mccoy 07/12/2022 1:15 PM Medical Record Number: [id number] Patient Account Number: [id number] Date of Birth/Sex: Treating RN: 1961/01/02 (61 y.o. Sean Mccoy Primary Care Provider: Cari Caraway Other Clinician: Referring Provider: Treating Provider/Extender: Dyke Brackett in Treatment: 0 Subjective Chief Complaint Information obtained from Patient 07/12/2022; right ankle wound, Right second medial toe wound History of Present Illness (HPI) Admission 07/12/2022 Mr. Arling Cerone is a 61 year old male with a past medical history of venous insufficiency, avascular necrosis of the bone of the right hip status post total  hip replacement, alcohol abuse, subdural hematoma and facial fracture, hypertension that presents to the clinic for a right ankle wound. This is a wound that waxes and wanes in healing. It started 10 months ago. He follows with Dr. Allena Mccoy, podiatry for this issue. He has been using Betadine wet-to-dry dressings. He has been on several courses of antibiotics for this issue including doxycycline and Keflex. He states that the wound has been stable for the past several months. He has lymphedema and owns compression stockings but is not currently using them. He denies signs of infection. He also developed a second right toe wound several months ago. He has been using Betadine to this area as well. Patient History Information obtained from Patient, Chart. Allergies No Known Allergies Family History Diabetes - Mother, Stroke - Father, No family history of Cancer, Heart Disease, Hereditary Spherocytosis, Hypertension, Kidney Disease, Lung Disease, Seizures, Thyroid Problems, Tuberculosis. Social History Never smoker, Marital Status - Single, Alcohol Use - Rarely, Drug Use - No History, Caffeine Use -  Rarely. Medical History Cardiovascular Patient has history of Hypertension Musculoskeletal Patient has history of Rheumatoid Arthritis Medical A Surgical History Notes nd Genitourinary Enlarged Prostate Psychiatric Depression, Anxiety, PTSD Review of Systems (ROS) Ear/Nose/Mouth/Throat Denies complaints or symptoms of Chronic sinus problems or rhinitis. Respiratory Denies complaints or symptoms of Chronic or frequent coughs, Shortness of Breath. Gastrointestinal Denies complaints or symptoms of Frequent diarrhea, Nausea, Vomiting. Endocrine Denies complaints or symptoms of Heat/cold intolerance. Genitourinary Denies complaints or symptoms of Frequent urination. Integumentary (Skin) Complains or has symptoms of Wounds - Right Foot. Neurologic Denies complaints or symptoms of Numbness/parasthesias. Objective Constitutional respirations regular, non-labored and within target range for patient.. Vitals Time Taken: 1:41 PM, Height: 70 in, Source: Stated, Weight: 218 lbs, Source: Stated, BMI: 31.3, Temperature: 98.3 F, Pulse: 102 bpm, Respiratory Rate: 18 breaths/min, Blood Pressure: 144/75 mmHg. Cardiovascular 2+ dorsalis pedis/posterior tibialis pulses. Psychiatric pleasant and cooperative. General Notes: Right foot: T the lateral ankle there is an open wound with granulation tissue and fibrinous tissue. No signs of surrounding infection. o Lymphedema skin changes noted. non pitting edema to the knee. T the second right toe, medial aspect there is an open wound also with granulation tissue o present. Integumentary (Hair, Skin) Wound #1 status is Open. Original cause of wound was Trauma. The date acquired was: 01/18/2022. The wound is located on the Right,Lateral Foot. The wound measures 2cm length x 3cm width x 0.1cm depth; 4.712cm^2 area and 0.471cm^3 volume. There is Fat Layer (Subcutaneous Tissue) exposed. There is no tunneling or undermining noted. There is a medium  amount of serosanguineous drainage noted. The wound margin is distinct with the outline attached to the wound base. There is large (67-100%) red granulation within the wound bed. There is a small (1-33%) amount of necrotic tissue within the wound bed. Wound #2 status is Open. Original cause of wound was Trauma. The date acquired was: 01/18/2022. The wound is located on the Right T Second. The wound oe measures 2.2cm length x 1cm width x 0.1cm depth; 1.728cm^2 area and 0.173cm^3 volume. There is Fat Layer (Subcutaneous Tissue) exposed. There is no tunneling or undermining noted. There is a medium amount of serosanguineous drainage noted. The wound margin is distinct with the outline attached to the wound base. There is large (67-100%) red granulation within the wound bed. There is a small (1-33%) amount of necrotic tissue within the wound bed including Adherent Slough. Assessment Active Problems ICD-10 Non-pressure chronic ulcer of other part of  right foot with fat layer exposed Chronic venous hypertension (idiopathic) with ulcer of right lower extremity Presence of right artificial hip joint Patient presents with a 52-month history of nonhealing wound to the right lateral ankle. He has about a 50-month history of open wound to the second right toe. The ankle wound is secondary to lymphedema. It is unclear how the right toe wound started. Currently no signs of soft tissue infection. At this time I recommended antibiotic ointment with PolyMem silver under compression therapy to the right lower extremity. And mupirocin ointment to the right second toe. He knows not to get the wrap wet and cannot keep this on for more than 7 days.His ABIs were 0.82 on the right. Strong pedal pulses. Should have adequate blood flow for healing. 46 minutes was spent on the encounter including face-to-face, EMR review and coordination of care Plan Follow-up Appointments: Return Appointment in 1 week. - 07/19/22 @ 2:45pm  with Dr. Mikey Mccoy and Sean Laity, RN (Room 7) Bathing/ Shower/ Hygiene: May shower with protection but do not get wound dressing(s) wet. Edema Control - Lymphedema / SCD / Other: Elevate legs to the level of the heart or above for 30 minutes daily and/or when sitting, a frequency of: - several times a day Avoid standing for long periods of time. Additional Orders / Instructions: Follow Nutritious Diet WOUND #1: - Foot Wound Laterality: Right, Lateral Cleanser: Soap and Water 1 x Per Week/30 Days Discharge Instructions: May shower and wash wound with dial antibacterial soap and water prior to dressing change. Cleanser: Wound Cleanser 1 x Per Week/30 Days Discharge Instructions: Cleanse the wound with wound cleanser prior to applying a clean dressing using gauze sponges, not tissue or cotton balls. Peri-Wound Care: Sween Lotion (Moisturizing lotion) 1 x Per Week/30 Days Discharge Instructions: Apply moisturizing lotion as directed Topical: Gentamicin 1 x Per Week/30 Days Discharge Instructions: As directed by physician Topical: Mupirocin Ointment 1 x Per Week/30 Days Discharge Instructions: Apply Mupirocin (Bactroban) as instructed Prim Dressing: PolyMem Silver Non-Adhesive Dressing, 4.25x4.25 in 1 x Per Week/30 Days ary Discharge Instructions: Apply to wound bed as instructed Secondary Dressing: ABD Pad, 8x10 1 x Per Week/30 Days Discharge Instructions: Apply over primary dressing as directed. Com pression Wrap: Kerlix Roll 4.5x3.1 (in/yd) 1 x Per Week/30 Days Discharge Instructions: Apply Kerlix and Coban compression as directed. Com pression Wrap: Coban Self-Adherent Wrap 4x5 (in/yd) 1 x Per Week/30 Days Discharge Instructions: Apply over Kerlix as directed. WOUND #2: - T Second Wound Laterality: Right oe Cleanser: Soap and Water 1 x Per Day/30 Days Discharge Instructions: May wash wound with Dial antibacterial soap and water prior to dressing change. Topical: Mupirocin Ointment 1 x Per  Day/30 Days Discharge Instructions: Apply Mupirocin (Bactroban) as instructed Secondary Dressing: Woven Gauze Sponges 2x2 in 1 x Per Day/30 Days Discharge Instructions: Apply over primary dressing as directed. Secured With: Insurance underwriter, Sterile 2x75 (in/in) 1 x Per Day/30 Days Discharge Instructions: Secure with stretch gauze as directed. Secured With: 28M Medipore H Soft Cloth Surgical T ape, 4 x 10 (in/yd) 1 x Per Day/30 Days Discharge Instructions: Secure with tape as directed. 1. Antibiotic ointment with PolyMem silver under Kerlix/Coban 2. Mupirocin ointment to the second right toe 3. Follow-up in 1 week Electronic Signature(s) Signed: 07/12/2022 3:52:49 PM By: Geralyn Corwin DO Entered By: Geralyn Sean on 07/12/2022 15:03:05 -------------------------------------------------------------------------------- HxROS Details Patient Name: Date of Service: Sean Mccoy, Sean Mccoy 07/12/2022 1:15 PM Medical Record Number: [id number] Patient Account Number: [id number]  Date of Birth/Sex: Treating RN: [birth year] (61 y.o. Sean Mccoy Primary Care Provider: Cari Caraway Other Clinician: Referring Provider: Treating Provider/Extender: Dyke Brackett in Treatment: 0 Information Obtained From Patient Chart Ear/Nose/Mouth/Throat Complaints and Symptoms: Negative for: Chronic sinus problems or rhinitis Respiratory Complaints and Symptoms: Negative for: Chronic or frequent coughs; Shortness of Breath Gastrointestinal Complaints and Symptoms: Negative for: Frequent diarrhea; Nausea; Vomiting Endocrine Complaints and Symptoms: Negative for: Heat/cold intolerance Genitourinary Complaints and Symptoms: Negative for: Frequent urination Medical History: Past Medical History Notes: Enlarged Prostate Integumentary (Skin) Complaints and Symptoms: Positive for: Wounds - Right Foot Neurologic Complaints and Symptoms: Negative for:  Numbness/parasthesias Eyes Hematologic/Lymphatic Cardiovascular Medical History: Positive for: Hypertension Immunological Musculoskeletal Medical History: Positive for: Rheumatoid Arthritis Oncologic Psychiatric Medical History: Past Medical History Notes: Depression, Anxiety, PTSD Immunizations Pneumococcal Vaccine: Received Pneumococcal Vaccination: No Implantable Devices None Family and Social History Cancer: No; Diabetes: Yes - Mother; Heart Disease: No; Hereditary Spherocytosis: No; Hypertension: No; Kidney Disease: No; Lung Disease: No; Seizures: No; Stroke: Yes - Father; Thyroid Problems: No; Tuberculosis: No; Never smoker; Marital Status - Single; Alcohol Use: Rarely; Drug Use: No History; Caffeine Use: Rarely; Financial Concerns: No; Food, Clothing or Shelter Needs: No; Support System Lacking: No; Transportation Concerns: No Electronic Signature(s) Signed: 07/12/2022 3:44:44 PM By: Antonieta Iba Signed: 07/12/2022 3:52:49 PM By: Geralyn Corwin DO Entered By: Antonieta Iba on 07/12/2022 13:45:56 -------------------------------------------------------------------------------- SuperBill Details Patient Name: Date of Service: Sean Mccoy 07/12/2022 Medical Record Number: [id number] Patient Account Number: [id number] Date of Birth/Sex: Treating RN: May 12, 1961 (61 y.o. Sean Mccoy Primary Care Provider: Cari Caraway Other Clinician: Referring Provider: Treating Provider/Extender: Dyke Brackett in Treatment: 0 Diagnosis Coding ICD-10 Codes Code Description [id number] Non-pressure chronic ulcer of other part of right foot with fat layer exposed I87.311 Chronic venous hypertension (idiopathic) with ulcer of right lower extremity Z96.641 Presence of right artificial hip joint Facility Procedures CPT4 Code: [id number] Description: [phone] - WOUND CARE VISIT-LEV 5 EST PT Modifier: 25 Quantity: 1 Physician Procedures : CPT4 Code Description  Modifier [id number] - WC PHYS LEVEL 4 - NEW PT ICD-10 Diagnosis Description L97.512 Non-pressure chronic ulcer of other part of right foot with fat layer exposed I87.311 Chronic venous hypertension (idiopathic) with ulcer of  right lower extremity Z96.641 Presence of right artificial hip joint Quantity: 1 Electronic Signature(s) Signed: 07/12/2022 3:52:49 PM By: Geralyn Corwin DO Entered By: Geralyn Sean on 07/12/2022 15:03:18

## 2022-07-13 ENCOUNTER — Telehealth: Payer: Self-pay | Admitting: *Deleted

## 2022-07-13 NOTE — Telephone Encounter (Signed)
Patient has been scheduled 07/24/@2 :45

## 2022-07-13 NOTE — Telephone Encounter (Signed)
-----   Message from Candelaria Stagers, DPM sent at 06/30/2022  8:55 AM EDT ----- Regarding: Referral to the wound care center [facility],   Can you refer this patient to the wound care center at [facility]  Thank you

## 2022-07-19 ENCOUNTER — Encounter (HOSPITAL_BASED_OUTPATIENT_CLINIC_OR_DEPARTMENT_OTHER): Payer: Medicaid Other | Admitting: Internal Medicine

## 2022-07-29 ENCOUNTER — Encounter (HOSPITAL_BASED_OUTPATIENT_CLINIC_OR_DEPARTMENT_OTHER): Payer: Medicaid Other | Admitting: Internal Medicine

## 2022-08-05 ENCOUNTER — Encounter (HOSPITAL_BASED_OUTPATIENT_CLINIC_OR_DEPARTMENT_OTHER): Payer: Medicaid Other | Attending: Internal Medicine | Admitting: Internal Medicine

## 2022-08-05 DIAGNOSIS — I10 Essential (primary) hypertension: Secondary | ICD-10-CM | POA: Diagnosis not present

## 2022-08-05 DIAGNOSIS — I872 Venous insufficiency (chronic) (peripheral): Secondary | ICD-10-CM | POA: Insufficient documentation

## 2022-08-05 DIAGNOSIS — L97512 Non-pressure chronic ulcer of other part of right foot with fat layer exposed: Secondary | ICD-10-CM | POA: Diagnosis not present

## 2022-08-05 DIAGNOSIS — I890 Lymphedema, not elsewhere classified: Secondary | ICD-10-CM | POA: Insufficient documentation

## 2022-08-05 DIAGNOSIS — I87311 Chronic venous hypertension (idiopathic) with ulcer of right lower extremity: Secondary | ICD-10-CM | POA: Diagnosis not present

## 2022-08-05 DIAGNOSIS — M069 Rheumatoid arthritis, unspecified: Secondary | ICD-10-CM | POA: Diagnosis not present

## 2022-08-05 NOTE — Progress Notes (Addendum)
Sean, Mccoy ([id number]) Visit Report for 08/05/2022 Arrival Information Details Patient Name: Date of Service: Sean Mccoy 08/05/2022 3:30 PM Medical Record Number: [id number] Patient Account Number: [id number] Date of Birth/Sex: Treating RN: 05-11-1961 (61 y.o. [name] Primary Care Lizann Edelman: Cari Caraway Other Clinician: Referring Kymberley Raz: Treating Quatavious Rossa/Extender: Johnston Ebbs in Treatment: 3 Visit Information History Since Last Visit Added or deleted any medications: No Patient Arrived: Ambulatory Any new allergies or adverse reactions: No Arrival Time: 15:52 Had a fall or experienced change in No Accompanied By: self activities of daily living that may affect Transfer Assistance: None risk of falls: Patient Identification Verified: Yes Signs or symptoms of abuse/neglect since last visito No Secondary Verification Process Completed: Yes Hospitalized since last visit: No Patient Requires Transmission-Based Precautions: No Implantable device outside of the clinic excluding No Patient Has Alerts: No cellular tissue based products placed in the center since last visit: Has Dressing in Place as Prescribed: Yes Pain Present Now: Yes Electronic Signature(s) Signed: 08/05/2022 4:44:02 PM By: Thayer Dallas Entered By: Thayer Dallas on 08/05/2022 15:53:19 -------------------------------------------------------------------------------- Encounter Discharge Information Details Patient Name: Date of Service: Sean Mccoy 08/05/2022 3:30 PM Medical Record Number: [id number] Patient Account Number: [id number] Date of Birth/Sex: Treating RN: August 01, 1961 (61 y.o. [name] Primary Care Teffany Blaszczyk: Cari Caraway Other Clinician: Referring Evaleigh Mccamy: Treating Rehman Levinson/Extender: Johnston Ebbs in Treatment: 3 Encounter Discharge Information Items Post Procedure Vitals Discharge Condition: Stable Temperature (F):  98.7 Ambulatory Status: Ambulatory Pulse (bpm): 114 Discharge Destination: Home Respiratory Rate (breaths/min): 18 Transportation: Other Blood Pressure (mmHg): 116/63 Schedule Follow-up Appointment: Yes Clinical Summary of Care: Provided on 08/05/2022 Form Type Recipient Paper Patient Patient Electronic Signature(s) Signed: 08/05/2022 4:54:52 PM By: Antonieta Iba Entered By: Antonieta Iba on 08/05/2022 16:54:52 -------------------------------------------------------------------------------- Lower Extremity Assessment Details Patient Name: Date of Service: Sean Mccoy 08/05/2022 3:30 PM Medical Record Number: [id number] Patient Account Number: [id number] Date of Birth/Sex: Treating RN: March 15, 1961 (61 y.o. [name] Primary Care Guido Comp: Cari Caraway Other Clinician: Referring Sarkis Rhines: Treating Yobani Schertzer/Extender: Johnston Ebbs in Treatment: 3 Edema Assessment Assessed: Kyra Searles: No] [Right: No] E[Left: dema] [Right: :] Calf Left: Right: Point of Measurement: 35 cm From Medial Instep 43 cm Ankle Left: Right: Point of Measurement: 13 cm From Medial Instep 30.5 cm Vascular Assessment Pulses: Dorsalis Pedis Palpable: [Left:Yes] Electronic Signature(s) Signed: 08/05/2022 4:44:02 PM By: Thayer Dallas Signed: 08/05/2022 5:18:00 PM By: Antonieta Iba Entered By: Thayer Dallas on 08/05/2022 16:00:49 -------------------------------------------------------------------------------- Multi Wound Chart Details Patient Name: Date of Service: Sean Mccoy 08/05/2022 3:30 PM Medical Record Number: [id number] Patient Account Number: [id number] Date of Birth/Sex: Treating RN: 06/04/1961 (61 y.o. [name] Primary Care Kiven Vangilder: Cari Caraway Other Clinician: Referring Katelynne Revak: Treating Leverne Tessler/Extender: Johnston Ebbs in Treatment: 3 Vital Signs Height(in): 70 Pulse(bpm): 114 Weight(lbs): 218 Blood Pressure(mmHg):  116/63 Body Mass Index(BMI): 31.3 Temperature(F): 98.7 Respiratory Rate(breaths/min): 18 Photos: [1:Right, Lateral Foot] [2:Right T Second oe] [N/A:N/A N/A] Wound Location: [1:Trauma] [2:Trauma] [N/A:N/A] Wounding Event: [1:Infection - not elsewhere classified Infection - not elsewhere classified] [N/A:N/A] Primary Etiology: [1:Hypertension, Rheumatoid Arthritis Hypertension, Rheumatoid Arthritis] [N/A:N/A] Comorbid History: [1:01/18/2022] [2:01/18/2022] [N/A:N/A] Date Acquired: [1:3] [2:3] [N/A:N/A] Weeks of Treatment: [1:Open] [2:Healed - Epithelialized] [N/A:N/A] Wound Status: [1:No] [2:No] [N/A:N/A] Wound Recurrence: [1:1.9x3x0.1] [2:0x0x0] [N/A:N/A] Measurements L x W x D (cm) [1:4.477] [2:0] [N/A:N/A] A (cm) : rea [1:0.448] [2:0] [N/A:N/A] Volume (cm) : [1:5.00%] [2:100.00%] [N/A:N/A] % Reduction  in A rea: [1:4.90%] [2:100.00%] [N/A:N/A] % Reduction in Volume: [1:Full Thickness Without Exposed] [2:Full Thickness Without Exposed] [N/A:N/A] Classification: [1:Support Structures Medium] [2:Support Structures N/A] [N/A:N/A] Exudate A mount: [1:Serosanguineous] [2:N/A] [N/A:N/A] Exudate Type: [1:red, brown] [2:N/A] [N/A:N/A] Exudate Color: [1:Distinct, outline attached] [2:N/A] [N/A:N/A] Wound Margin: [1:Large (67-100%)] [2:N/A] [N/A:N/A] Granulation A mount: [1:Red] [2:N/A] [N/A:N/A] Granulation Quality: [1:Small (1-33%)] [2:N/A] [N/A:N/A] Necrotic A mount: [1:Fat Layer (Subcutaneous Tissue): Yes N/A] [N/A:N/A] Exposed Structures: [1:Fascia: No Tendon: No Muscle: No Joint: No Bone: No None] [2:N/A] [N/A:N/A] Epithelialization: [1:Debridement - Excisional] [2:N/A] [N/A:N/A] Debridement: Pre-procedure Verification/Time Out 16:21 [2:N/A] [N/A:N/A] Taken: [1:Lidocaine 4% Topical Solution] [2:N/A] [N/A:N/A] Pain Control: [1:Subcutaneous, Slough] [2:N/A] [N/A:N/A] Tissue Debrided: [1:Skin/Subcutaneous Tissue] [2:N/A] [N/A:N/A] Level: [1:6.9] [2:N/A] [N/A:N/A] Debridement A (sq cm):  [1:rea Curette] [2:N/A] [N/A:N/A] Instrument: [1:Minimum] [2:N/A] [N/A:N/A] Bleeding: [1:Pressure] [2:N/A] [N/A:N/A] Hemostasis A chieved: [1:Procedure was tolerated well] [2:N/A] [N/A:N/A] Debridement Treatment Response: [1:2.3x3x0.1] [2:N/A] [N/A:N/A] Post Debridement Measurements L x W x D (cm) [1:0.542] [2:N/A] [N/A:N/A] Post Debridement Volume: (cm) [1:Debridement] [2:N/A] [N/A:N/A] Treatment Notes Wound #1 (Foot) Wound Laterality: Right, Lateral Cleanser Soap and Water Discharge Instruction: May shower and wash wound with dial antibacterial soap and water prior to dressing change. Wound Cleanser Discharge Instruction: Cleanse the wound with wound cleanser prior to applying a clean dressing using gauze sponges, not tissue or cotton balls. Peri-Wound Care Sween Lotion (Moisturizing lotion) Discharge Instruction: Apply moisturizing lotion as directed Topical Gentamicin Discharge Instruction: As directed by physician Mupirocin Ointment Discharge Instruction: Apply Mupirocin (Bactroban) as instructed Primary Dressing PolyMem Silver Non-Adhesive Dressing, 4.25x4.25 in Discharge Instruction: Apply to wound bed as instructed Secondary Dressing ABD Pad, 8x10 Discharge Instruction: Apply over primary dressing as directed. Secured With Compression Wrap Kerlix Roll 4.5x3.1 (in/yd) Discharge Instruction: Apply Kerlix and Coban compression as directed. Coban Self-Adherent Wrap 4x5 (in/yd) Discharge Instruction: Apply over Kerlix as directed. Compression Stockings Add-Ons Electronic Signature(s) Signed: 08/06/2022 1:41:40 PM By: Geralyn Corwin DO Signed: 08/09/2022 4:34:00 PM By: Bo Mcclintock By: Geralyn Sean on 08/06/2022 13:36:43 -------------------------------------------------------------------------------- Multi-Disciplinary Care Plan Details Patient Name: Date of Service: Sean Mccoy 08/05/2022 3:30 PM Medical Record Number: [id number] Patient Account  Number: [id number] Date of Birth/Sex: Treating RN: February 25, 1961 (61 y.o. [name] Primary Care Fayez Sturgell: Cari Caraway Other Clinician: Referring Brianni Manthe: Treating Aaliayah Miao/Extender: Johnston Ebbs in Treatment: 3 Active Inactive Venous Leg Ulcer Nursing Diagnoses: Potential for venous Insuffiency (use before diagnosis confirmed) Goals: Patient will maintain optimal edema control Date Initiated: 07/12/2022 Target Resolution Date: 09/02/2022 Goal Status: Active Interventions: Assess peripheral edema status every visit. Compression as ordered Provide education on venous insufficiency Notes: Wound/Skin Impairment Nursing Diagnoses: Impaired tissue integrity Goals: Patient/caregiver will verbalize understanding of skin care regimen Date Initiated: 07/12/2022 Target Resolution Date: 09/02/2022 Goal Status: Active Ulcer/skin breakdown will have a volume reduction of 30% by week 4 Date Initiated: 07/12/2022 Target Resolution Date: 08/09/2022 Goal Status: Active Interventions: Assess patient/caregiver ability to obtain necessary supplies Assess patient/caregiver ability to perform ulcer/skin care regimen upon admission and as needed Assess ulceration(s) every visit Provide education on ulcer and skin care Treatment Activities: Topical wound management initiated : 07/12/2022 Notes: Electronic Signature(s) Signed: 08/05/2022 5:18:00 PM By: Antonieta Iba Entered By: Antonieta Iba on 08/05/2022 16:14:01 -------------------------------------------------------------------------------- Pain Assessment Details Patient Name: Date of Service: Sean Mccoy 08/05/2022 3:30 PM Medical Record Number: [id number] Patient Account Number: [id number] Date of Birth/Sex: Treating RN: [birth year] (61 y.o. [name] Primary Care Orris Perin: Cari Caraway Other Clinician: Referring Uldine Fuster:  Treating Kylen Ismael/Extender: Johnston Ebbs in Treatment:  3 Active Problems Location of Pain Severity and Description of Pain Patient Has Paino Yes Site Locations Rate the pain. Current Pain Level: 10 Pain Management and Medication Current Pain Management: Electronic Signature(s) Signed: 08/05/2022 4:44:02 PM By: Thayer Dallas Signed: 08/05/2022 5:18:00 PM By: Antonieta Iba Entered By: Thayer Dallas on 08/05/2022 15:53:32 -------------------------------------------------------------------------------- Patient/Caregiver Education Details Patient Name: Date of Service: Sean Mccoy 8/10/2023andnbsp3:30 PM Medical Record Number: [id number] Patient Account Number: [id number] Date of Birth/Gender: Treating RN: 07/06/1961 (61 y.o. [name] Primary Care Physician: Cari Caraway Other Clinician: Referring Physician: Treating Physician/Extender: Johnston Ebbs in Treatment: 3 Education Assessment Education Provided To: Patient and Caregiver Education Topics Provided Venous: Methods: Explain/Verbal, Printed Responses: Reinforcements needed, State content correctly Wound/Skin Impairment: Methods: Explain/Verbal, Clinical cytogeneticist) Signed: 08/05/2022 5:18:00 PM By: Antonieta Iba Entered By: Antonieta Iba on 08/05/2022 16:14:19 -------------------------------------------------------------------------------- Wound Assessment Details Patient Name: Date of Service: Sean Mccoy 08/05/2022 3:30 PM Medical Record Number: [id number] Patient Account Number: [id number] Date of Birth/Sex: Treating RN: 12-24-1961 (61 y.o. [name] Primary Care Valgene Deloatch: Cari Caraway Other Clinician: Referring Gerard Cantara: Treating Truxton Stupka/Extender: Johnston Ebbs in Treatment: 3 Wound Status Wound Number: 1 Primary Etiology: Infection - not elsewhere classified Wound Location: Right, Lateral Foot Wound Status: Open Wounding Event: Trauma Comorbid History: Hypertension, Rheumatoid  Arthritis Date Acquired: 01/18/2022 Weeks Of Treatment: 3 Clustered Wound: No Photos Wound Measurements Length: (cm) 1.9 Width: (cm) 3 Depth: (cm) 0.1 Area: (cm) 4.477 Volume: (cm) 0.448 % Reduction in Area: 5% % Reduction in Volume: 4.9% Epithelialization: None Tunneling: No Undermining: No Wound Description Classification: Full Thickness Without Exposed Support Structures Wound Margin: Distinct, outline attached Exudate Amount: Medium Exudate Type: Serosanguineous Exudate Color: red, brown Foul Odor After Cleansing: No Slough/Fibrino Yes Wound Bed Granulation Amount: Large (67-100%) Exposed Structure Granulation Quality: Red Fascia Exposed: No Necrotic Amount: Small (1-33%) Fat Layer (Subcutaneous Tissue) Exposed: Yes Tendon Exposed: No Muscle Exposed: No Joint Exposed: No Bone Exposed: No Treatment Notes Wound #1 (Foot) Wound Laterality: Right, Lateral Cleanser Soap and Water Discharge Instruction: May shower and wash wound with dial antibacterial soap and water prior to dressing change. Wound Cleanser Discharge Instruction: Cleanse the wound with wound cleanser prior to applying a clean dressing using gauze sponges, not tissue or cotton balls. Peri-Wound Care Sween Lotion (Moisturizing lotion) Discharge Instruction: Apply moisturizing lotion as directed Topical Gentamicin Discharge Instruction: As directed by physician Mupirocin Ointment Discharge Instruction: Apply Mupirocin (Bactroban) as instructed Primary Dressing PolyMem Silver Non-Adhesive Dressing, 4.25x4.25 in Discharge Instruction: Apply to wound bed as instructed Secondary Dressing ABD Pad, 8x10 Discharge Instruction: Apply over primary dressing as directed. Secured With Compression Wrap Kerlix Roll 4.5x3.1 (in/yd) Discharge Instruction: Apply Kerlix and Coban compression as directed. Coban Self-Adherent Wrap 4x5 (in/yd) Discharge Instruction: Apply over Kerlix as directed. Compression  Stockings Add-Ons Electronic Signature(s) Signed: 08/06/2022 1:41:40 PM By: Geralyn Corwin DO Signed: 08/09/2022 4:34:00 PM By: Antonieta Iba Previous Signature: 08/05/2022 4:44:02 PM Version By: Thayer Dallas Previous Signature: 08/05/2022 5:18:00 PM Version By: Antonieta Iba Entered By: Geralyn Sean on 08/06/2022 13:36:33 -------------------------------------------------------------------------------- Wound Assessment Details Patient Name: Date of Service: Sean Mccoy 08/05/2022 3:30 PM Medical Record Number: [id number] Patient Account Number: [id number] Date of Birth/Sex: Treating RN: 21-Oct-1961 (61 y.o. [name] Primary Care Jestin Burbach: Cari Caraway Other Clinician: Referring Kahlani Graber: Treating Myeshia Fojtik/Extender: Johnston Ebbs in Treatment: 3 Wound Status  Wound Number: 2 Primary Etiology: Infection - not elsewhere classified Wound Location: Right T Second oe Wound Status: Healed - Epithelialized Wounding Event: Trauma Comorbid History: Hypertension, Rheumatoid Arthritis Date Acquired: 01/18/2022 Weeks Of Treatment: 3 Clustered Wound: No Photos Wound Measurements Length: (cm) Width: (cm) Depth: (cm) Area: (cm) Volume: (cm) 0 % Reduction in Area: 100% 0 % Reduction in Volume: 100% 0 0 0 Wound Description Classification: Full Thickness Without Exposed Support Structur es Electronic Signature(s) Signed: 08/05/2022 5:18:00 PM By: Antonieta Iba Entered By: Antonieta Iba on 08/05/2022 16:26:58 -------------------------------------------------------------------------------- Vitals Details Patient Name: Date of Service: [name], ISA A Mccoy 08/05/2022 3:30 PM Medical Record Number: [id number] Patient Account Number: [id number] Date of Birth/Sex: Treating RN: 05/13/1961 (61 y.o. [name] Primary Care Jefrey Raburn: Cari Caraway Other Clinician: Referring Clarabell Matsuoka: Treating Kala Ambriz/Extender: Johnston Ebbs  in Treatment: 3 Vital Signs Time Taken: 15:55 Temperature (F): 98.7 Height (in): 70 Pulse (bpm): 114 Weight (lbs): 218 Respiratory Rate (breaths/min): 18 Body Mass Index (BMI): 31.3 Blood Pressure (mmHg): 116/63 Reference Range: 80 - 120 mg / dl Electronic Signature(s) Signed: 08/05/2022 4:44:02 PM By: Thayer Dallas Entered By: Thayer Dallas on 08/05/2022 15:56:28

## 2022-08-12 ENCOUNTER — Encounter (HOSPITAL_BASED_OUTPATIENT_CLINIC_OR_DEPARTMENT_OTHER): Payer: Medicaid Other | Admitting: General Surgery

## 2022-08-13 NOTE — Progress Notes (Signed)
Sean, Mccoy ([id number]) Visit Report for 08/05/2022 Chief Complaint Document Details Patient Name: Date of Service: Sean Mccoy 08/05/2022 3:30 PM Medical Record Number: [id number] Patient Account Number: [id number] Date of Birth/Sex: Treating RN: Dec 18, 1961 (61 y.o. Sean Mccoy Primary Care Provider: Cari Caraway Other Clinician: Referring Provider: Treating Provider/Extender: Johnston Ebbs in Treatment: 3 Information Obtained from: Patient Chief Complaint 07/12/2022; right ankle wound, Right second medial toe wound Electronic Signature(s) Signed: 08/06/2022 1:41:40 PM By: Sean Corwin DO Entered By: Sean Sean on 08/06/2022 13:37:08 -------------------------------------------------------------------------------- Debridement Details Patient Name: Date of Service: Sean Mccoy 08/05/2022 3:30 PM Medical Record Number: [id number] Patient Account Number: [id number] Date of Birth/Sex: Treating RN: Jan 06, 1961 (61 y.o. Sean Mccoy Primary Care Provider: Cari Caraway Other Clinician: Referring Provider: Treating Provider/Extender: Johnston Ebbs in Treatment: 3 Debridement Performed for Assessment: Wound #1 Right,Lateral Foot Performed By: Physician Sean Corwin, DO Debridement Type: Debridement Level of Consciousness (Pre-procedure): Awake and Alert Pre-procedure Verification/Time Out Yes - 16:21 Taken: Start Time: 16:22 Pain Control: Lidocaine 4% T opical Solution T Area Debrided (L x W): otal 2.3 (cm) x 3 (cm) = 6.9 (cm) Tissue and other material debrided: Non-Viable, Slough, Subcutaneous, Slough Level: Skin/Subcutaneous Tissue Debridement Description: Excisional Instrument: Curette Bleeding: Minimum Hemostasis Achieved: Pressure End Time: 16:26 Response to Treatment: Procedure was tolerated well Level of Consciousness (Post- Awake and Alert procedure): Post Debridement Measurements of Total  Wound Length: (cm) 2.3 Width: (cm) 3 Depth: (cm) 0.1 Volume: (cm) 0.542 Character of Wound/Ulcer Post Debridement: Stable Post Procedure Diagnosis Same as Pre-procedure Electronic Signature(s) Signed: 08/05/2022 5:18:00 PM By: Antonieta Iba Signed: 08/06/2022 1:41:40 PM By: Sean Corwin DO Entered By: Antonieta Iba on 08/05/2022 16:28:07 -------------------------------------------------------------------------------- HPI Details Patient Name: Date of Service: Sean Mccoy 08/05/2022 3:30 PM Medical Record Number: [id number] Patient Account Number: [id number] Date of Birth/Sex: Treating RN: May 06, 1961 (61 y.o. Sean Mccoy Primary Care Provider: Cari Caraway Other Clinician: Referring Provider: Treating Provider/Extender: Johnston Ebbs in Treatment: 3 History of Present Illness HPI Description: Admission 07/12/2022 Mr. 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 This is a wound that waxes and wanes in healing. It started 10 months ago. He follows with Dr. Allena Katz, podiatry for this issue. He has been using Betadine wet-to-dry dressings. He has been on several courses of antibiotics for this issue including doxycycline and Keflex. He states that the wound has been stable for the past several months. He has lymphedema and owns compression stockings but is not currently using them. He denies signs of infection. He also developed a second right toe wound several months ago. He has been using Betadine to this area as well. 8/11; patient presents for follow-up. Unfortunately he missed his last clinic appointments almost been 1 month since have last seen him. 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 Kerlix/Coban was used  with PolyMem silver and antibiotic ointment. He states he took this off after 7 days. He is currently been keeping the area covered. He denies signs of infection. We have also been treating a second right toe wound. This is healed. Electronic Signature(s) Signed: 08/06/2022 1:41:40 PM By: Sean Corwin DO Entered By: Sean Sean on 08/06/2022 13:38:40 -------------------------------------------------------------------------------- Physical  Exam Details Patient Name: Date of Service: Sean Mccoy 08/05/2022 3:30 PM Medical Record Number: [id number] Patient Account Number: [id number] Date of Birth/Sex: Treating RN: [birth year] (61 y.o. Sean Mccoy Primary Care Provider: Cari Caraway Other Clinician: Referring Provider: Treating Provider/Extender: Johnston Ebbs in Treatment: 3 Constitutional respirations regular, non-labored and within target range for patient.. Cardiovascular 2+ dorsalis pedis/posterior tibialis pulses. Psychiatric pleasant and cooperative. Notes Right foot: T the lateral ankle there is an open wound with granulation tissue, Slough and fibrinous tissue. No signs of surrounding infection. Lymphedema skin o changes noted. non pitting edema to the knee. T the second right toe, medial aspect there is Epithelization to the previous wound site. o Electronic Signature(s) Signed: 08/06/2022 1:41:40 PM By: Sean Corwin DO Entered By: Sean Sean on 08/06/2022 13:40:10 -------------------------------------------------------------------------------- Physician Orders Details Patient Name: Date of Service: Sean Mccoy 08/05/2022 3:30 PM Medical Record Number: [id number] Patient Account Number: [id number] Date of Birth/Sex: Treating RN: 03-Jun-1961 (61 y.o. Sean Mccoy Primary Care Provider: Cari Caraway Other Clinician: Referring Provider: Treating Provider/Extender: Johnston Ebbs in Treatment:  3 Verbal / Phone Orders: No Diagnosis Coding Follow-up Appointments ppointment in 2 weeks. - 08/19/22 @ 12:30pm with Dr. Mikey Bussing and Lennox Laity, RN (Room 7) Return A Nurse Visit: - 08/12/22 @ 12:30pm with Lennox Laity, RN Anesthetic (In clinic) Topical Lidocaine 5% applied to wound bed - Prior to debridement (In clinic) Topical Lidocaine 4% applied to wound bed - Prior to debridement Bathing/ Shower/ Hygiene May shower with protection but do not get wound dressing(s) wet. Edema Control - Lymphedema / SCD / Other Elevate legs to the level of the heart or above for 30 minutes daily and/or when sitting, a frequency of: - several times a day Avoid standing for long periods of time. Additional Orders / Instructions Follow Nutritious Diet Wound Treatment Wound #1 - Foot Wound Laterality: Right, Lateral Cleanser: Soap and Water 1 x Per Week/30 Days Discharge Instructions: May shower and wash wound with dial antibacterial soap and water prior to dressing change. Cleanser: Wound Cleanser 1 x Per Week/30 Days Discharge Instructions: Cleanse the wound with wound cleanser prior to applying a clean dressing using gauze sponges, not tissue or cotton balls. Peri-Wound Care: Sween Lotion (Moisturizing lotion) 1 x Per Week/30 Days Discharge Instructions: Apply moisturizing lotion as directed Topical: Gentamicin 1 x Per Week/30 Days Discharge Instructions: As directed by physician Topical: Mupirocin Ointment 1 x Per Week/30 Days Discharge Instructions: Apply Mupirocin (Bactroban) as instructed Prim Dressing: PolyMem Silver Non-Adhesive Dressing, 4.25x4.25 in 1 x Per Week/30 Days ary Discharge Instructions: Apply to wound bed as instructed Secondary Dressing: ABD Pad, 8x10 1 x Per Week/30 Days Discharge Instructions: Apply over primary dressing as directed. Compression Wrap: Kerlix Roll 4.5x3.1 (in/yd) 1 x Per Week/30 Days Discharge Instructions: Apply Kerlix and Coban compression as directed. Compression Wrap:  Coban Self-Adherent Wrap 4x5 (in/yd) 1 x Per Week/30 Days Discharge Instructions: Apply over Kerlix as directed. Electronic Signature(s) Signed: 08/06/2022 1:41:40 PM By: Sean Corwin DO Previous Signature: 08/05/2022 5:18:00 PM Version By: Antonieta Iba Entered By: Sean Sean on 08/06/2022 13:40:19 -------------------------------------------------------------------------------- Problem List Details Patient Name: Date of Service: Sean Mccoy 08/05/2022 3:30 PM Medical Record Number: [id number] Patient Account Number: [id number] Date of Birth/Sex: Treating RN: 1961-03-14 (61 y.o. Sean Mccoy Primary Care Provider: Cari Caraway Other Clinician: Referring Provider: Treating Provider/Extender: Johnston Ebbs in Treatment: 3 Active Problems ICD-10 Encounter Code Description  Active Date MDM Diagnosis L97.512 Non-pressure chronic ulcer of other part of right foot with fat layer exposed 07/12/2022 No Yes I87.311 Chronic venous hypertension (idiopathic) with ulcer of right lower extremity 07/12/2022 No Yes Z96.641 Presence of right artificial hip joint 07/12/2022 No Yes Inactive Problems Resolved Problems Electronic Signature(s) Signed: 08/06/2022 1:41:40 PM By: Sean Corwin DO Entered By: Sean Sean on 08/06/2022 13:36:39 -------------------------------------------------------------------------------- Progress Note Details Patient Name: Date of Service: Sean Mccoy 08/05/2022 3:30 PM Medical Record Number: [id number] Patient Account Number: [id number] Date of Birth/Sex: Treating RN: 06-20-1961 (61 y.o. Sean Mccoy Primary Care Provider: Cari Caraway Other Clinician: Referring Provider: Treating Provider/Extender: Johnston Ebbs in Treatment: 3 Subjective Chief Complaint Information obtained from Patient 07/12/2022; right ankle wound, Right second medial toe wound History of Present Illness (HPI) Admission  07/12/2022 Mr. 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 This is a wound that waxes and wanes in healing. It started 10 months ago. He follows with Dr. Allena Katz, podiatry for this issue. He has been using Betadine wet-to-dry dressings. He has been on several courses of antibiotics for this issue including doxycycline and Keflex. He states that the wound has been stable for the past several months. He has lymphedema and owns compression stockings but is not currently using them. He denies signs of infection. He also developed a second right toe wound several months ago. He has been using Betadine to this area as well. 8/11; patient presents for follow-up. Unfortunately he missed his last clinic appointments almost been 1 month since have last seen him. 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 Kerlix/Coban was used with PolyMem silver and antibiotic ointment. He states he took this off after 7 days. He is currently been keeping the area covered. He denies signs of infection. We have also been treating a second right toe wound. This is healed. Patient History Information obtained from Patient, Chart. Family History Diabetes - Mother, Stroke - Father, No family history of Cancer, Heart Disease, Hereditary Spherocytosis, Hypertension, Kidney Disease, Lung Disease, Seizures, Thyroid Problems, Tuberculosis. Social History Never smoker, Marital Status - Single, Alcohol Use - Rarely, Drug Use - No History, Caffeine Use - Rarely. Medical History Cardiovascular Patient has history of Hypertension Musculoskeletal Patient has history of Rheumatoid Arthritis Medical A Surgical History Notes nd Genitourinary Enlarged Prostate Psychiatric Depression, Anxiety,  PTSD Objective Constitutional respirations regular, non-labored and within target range for patient.. Vitals Time Taken: 3:55 PM, Height: 70 in, Weight: 218 lbs, BMI: 31.3, Temperature: 98.7 F, Pulse: 114 bpm, Respiratory Rate: 18 breaths/min, Blood Pressure: 116/63 mmHg. Cardiovascular 2+ dorsalis pedis/posterior tibialis pulses. Psychiatric pleasant and cooperative. General Notes: Right foot: T the lateral ankle there is an open wound with granulation tissue, Slough and fibrinous tissue. No signs of surrounding infection. o Lymphedema skin changes noted. non pitting edema to the knee. T the second right toe, medial aspect there is Epithelization to the previous wound site. o Integumentary (Hair, Skin) Wound #1 status is Open. Original cause of wound was Trauma. The date acquired was: 01/18/2022. The wound has been in treatment 3 weeks. The wound is located on the Right,Lateral Foot. The wound measures 1.9cm length x 3cm width x 0.1cm depth; 4.477cm^2 area and 0.448cm^3 volume. There is  Fat Layer (Subcutaneous Tissue) exposed. There is no tunneling or undermining noted. There is a medium amount of serosanguineous drainage noted. The wound margin is distinct with the outline attached to the wound base. There is large (67-100%) red granulation within the wound bed. There is a small (1-33%) amount of necrotic tissue within the wound bed. Wound #2 status is Healed - Epithelialized. Original cause of wound was Trauma. The date acquired was: 01/18/2022. The wound has been in treatment 3 weeks. The wound is located on the Right T Second. The wound measures 0cm length x 0cm width x 0cm depth; 0cm^2 area and 0cm^3 volume. oe Assessment Active Problems ICD-10 Non-pressure chronic ulcer of other part of right foot with fat layer exposed Chronic venous hypertension (idiopathic) with ulcer of right lower extremity Presence of right artificial hip joint Patient's wound section improvement in size)  last clinic visit. The second right toe wound is healed. I debrided nonviable tissue. I recommended continuing with PolyMem silver and antibiotic ointment under Kerlix/Coban. I recommended that he follow-up weekly. He knows to not keep the wrap on for more than 7 days or get it wet. He expressed understanding. He states he will try to make appointments weekly. Procedures Wound #1 Pre-procedure diagnosis of Wound #1 is an Infection - not elsewhere classified located on the Right,Lateral Foot . There was a Excisional Skin/Subcutaneous Tissue Debridement with a total area of 6.9 sq cm performed by Sean Corwin, DO. With the following instrument(s): Curette to remove Non-Viable tissue/material. Material removed includes Subcutaneous Tissue and Slough and after achieving pain control using Lidocaine 4% T opical Solution. No specimens were taken. A time out was conducted at 16:21, prior to the start of the procedure. A Minimum amount of bleeding was controlled with Pressure. The procedure was tolerated well. Post Debridement Measurements: 2.3cm length x 3cm width x 0.1cm depth; 0.542cm^3 volume. Character of Wound/Ulcer Post Debridement is stable. Post procedure Diagnosis Wound #1: Same as Pre-Procedure Plan Follow-up Appointments: Return Appointment in 2 weeks. - 08/19/22 @ 12:30pm with Dr. Mikey Bussing and Lennox Laity, RN (Room 7) Nurse Visit: - 08/12/22 @ 12:30pm with Lennox Laity, RN Anesthetic: (In clinic) Topical Lidocaine 5% applied to wound bed - Prior to debridement (In clinic) Topical Lidocaine 4% applied to wound bed - Prior to debridement Bathing/ Shower/ Hygiene: May shower with protection but do not get wound dressing(s) wet. Edema Control - Lymphedema / SCD / Other: Elevate legs to the level of the heart or above for 30 minutes daily and/or when sitting, a frequency of: - several times a day Avoid standing for long periods of time. Additional Orders / Instructions: Follow Nutritious Diet WOUND #1: -  Foot Wound Laterality: Right, Lateral Cleanser: Soap and Water 1 x Per Week/30 Days Discharge Instructions: May shower and wash wound with dial antibacterial soap and water prior to dressing change. Cleanser: Wound Cleanser 1 x Per Week/30 Days Discharge Instructions: Cleanse the wound with wound cleanser prior to applying a clean dressing using gauze sponges, not tissue or cotton balls. Peri-Wound Care: Sween Lotion (Moisturizing lotion) 1 x Per Week/30 Days Discharge Instructions: Apply moisturizing lotion as directed Topical: Gentamicin 1 x Per Week/30 Days Discharge Instructions: As directed by physician Topical: Mupirocin Ointment 1 x Per Week/30 Days Discharge Instructions: Apply Mupirocin (Bactroban) as instructed Prim Dressing: PolyMem Silver Non-Adhesive Dressing, 4.25x4.25 in 1 x Per Week/30 Days ary Discharge Instructions: Apply to wound bed as instructed Secondary Dressing: ABD Pad, 8x10 1 x Per Week/30 Days Discharge Instructions: Apply  over primary dressing as directed. Com pression Wrap: Kerlix Roll 4.5x3.1 (in/yd) 1 x Per Week/30 Days Discharge Instructions: Apply Kerlix and Coban compression as directed. Com pression Wrap: Coban Self-Adherent Wrap 4x5 (in/yd) 1 x Per Week/30 Days Discharge Instructions: Apply over Kerlix as directed. 1. In office sharp debridement 2. Antibiotic ointment with PolyMem silver under Kerlix/Coban 3. Follow-up in 1 week Electronic Signature(s) Signed: 08/06/2022 1:41:40 PM By: Sean Corwin DO Entered By: Sean Sean on 08/06/2022 13:41:08 -------------------------------------------------------------------------------- HxROS Details Patient Name: Date of Service: Sean Mccoy 08/05/2022 3:30 PM Medical Record Number: [id number] Patient Account Number: [id number] Date of Birth/Sex: Treating RN: 1961-11-26 (61 y.o. Sean Mccoy Primary Care Provider: Cari Caraway Other Clinician: Referring Provider: Treating Provider/Extender:  Johnston Ebbs in Treatment: 3 Information Obtained From Patient Chart Cardiovascular Medical History: Positive for: Hypertension Genitourinary Medical History: Past Medical History Notes: Enlarged Prostate Musculoskeletal Medical History: Positive for: Rheumatoid Arthritis Psychiatric Medical History: Past Medical History Notes: Depression, Anxiety, PTSD Immunizations Pneumococcal Vaccine: Received Pneumococcal Vaccination: No Implantable Devices None Family and Social History Cancer: No; Diabetes: Yes - Mother; Heart Disease: No; Hereditary Spherocytosis: No; Hypertension: No; Kidney Disease: No; Lung Disease: No; Seizures: No; Stroke: Yes - Father; Thyroid Problems: No; Tuberculosis: No; Never smoker; Marital Status - Single; Alcohol Use: Rarely; Drug Use: No History; Caffeine Use: Rarely; Financial Concerns: No; Food, Clothing or Shelter Needs: No; Support System Lacking: No; Transportation Concerns: No Electronic Signature(s) Signed: 08/06/2022 1:41:40 PM By: Sean Corwin DO Signed: 08/09/2022 4:34:00 PM By: Antonieta Iba Entered By: Sean Sean on 08/06/2022 13:38:43 -------------------------------------------------------------------------------- SuperBill Details Patient Name: Date of Service: Sean Mccoy 08/05/2022 Medical Record Number: [id number] Patient Account Number: [id number] Date of Birth/Sex: Treating RN: 05/01/1961 (61 y.o. Sean Mccoy Primary Care Provider: Cari Caraway Other Clinician: Referring Provider: Treating Provider/Extender: Johnston Ebbs in Treatment: 3 Diagnosis Coding ICD-10 Codes Code Description [phone] Non-pressure chronic ulcer of other part of right foot with fat layer exposed I87.311 Chronic venous hypertension (idiopathic) with ulcer of right lower extremity Z96.641 Presence of right artificial hip joint Facility Procedures CPT4 Code: [id number] Description: 11042 - DEB  SUBQ TISSUE 20 SQ CM/< ICD-10 Diagnosis Description L97.512 Non-pressure chronic ulcer of other part of right foot with fat layer exposed Modifier: Quantity: 1 Physician Procedures : CPT4 Code Description Modifier [id number] 11042 - WC PHYS SUBQ TISS 20 SQ CM ICD-10 Diagnosis Description L97.512 Non-pressure chronic ulcer of other part of right foot with fat layer exposed Quantity: 1 Electronic Signature(s) Signed: 08/06/2022 1:41:40 PM By: Sean Corwin DO Previous Signature: 08/05/2022 5:18:00 PM Version By: Antonieta Iba Entered By: Sean Sean on 08/06/2022 13:41:16

## 2022-08-16 ENCOUNTER — Encounter (HOSPITAL_BASED_OUTPATIENT_CLINIC_OR_DEPARTMENT_OTHER): Payer: Medicaid Other | Admitting: Internal Medicine

## 2022-08-19 ENCOUNTER — Ambulatory Visit (HOSPITAL_BASED_OUTPATIENT_CLINIC_OR_DEPARTMENT_OTHER): Payer: Medicaid Other | Admitting: Internal Medicine

## 2022-08-23 ENCOUNTER — Encounter (HOSPITAL_BASED_OUTPATIENT_CLINIC_OR_DEPARTMENT_OTHER): Payer: Medicaid Other | Admitting: Internal Medicine

## 2022-08-23 DIAGNOSIS — L97512 Non-pressure chronic ulcer of other part of right foot with fat layer exposed: Secondary | ICD-10-CM | POA: Diagnosis not present

## 2022-08-23 DIAGNOSIS — I87311 Chronic venous hypertension (idiopathic) with ulcer of right lower extremity: Secondary | ICD-10-CM

## 2022-08-23 DIAGNOSIS — Z96641 Presence of right artificial hip joint: Secondary | ICD-10-CM

## 2022-08-23 NOTE — Progress Notes (Signed)
Sean Mccoy ([id number]) Visit Report for 08/23/2022 Chief Complaint Document Details Patient Name: Date of Service: Sean Mccoy 08/23/2022 3:30 PM Medical Record Number: [id number] Patient Account Number: [id number] Date of Birth/Sex: Treating RN: 14-Oct-1961 (61 y.o. M) Primary Care Provider: Cari Caraway Other Clinician: Referring Provider: Treating Provider/Extender: Johnston Ebbs in Treatment: 6 Information Obtained from: Patient Chief Complaint 07/12/2022; right ankle wound, Right second medial toe wound Electronic Signature(s) Signed: 08/23/2022 4:36:36 PM By: Geralyn Corwin DO Entered By: Geralyn Sean on 08/23/2022 16:31:12 -------------------------------------------------------------------------------- HPI Details Patient Name: Date of Service: Sean Mccoy 08/23/2022 3:30 PM Medical Record Number: [id number] Patient Account Number: [id number] Date of Birth/Sex: Treating RN: [birth year] (61 y.o. 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 This is a wound that waxes and wanes in healing. It started 10 months ago. He follows with Dr. Allena Katz, podiatry for this issue. He has been using Betadine wet-to-dry dressings. He has been on several courses of antibiotics for this issue including doxycycline and Keflex. He states that the wound has been stable for the past several months. He has lymphedema and owns compression stockings but is not currently using them. He denies signs of infection. He  also developed a second right toe wound several months ago. He has been using Betadine to this area as well. 8/11; patient presents for follow-up. Unfortunately he missed his last clinic appointments almost been 1 month since have last seen him. 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 Kerlix/Coban was used with PolyMem silver and antibiotic ointment. He states he took this off after 7 days. He is currently been keeping the area covered. He denies signs of infection. We have also been treating a second right toe wound. This is healed. 8/28; patient presents for follow-up. Again he missed his last clinic appointment. He took the wrap off after 7 days. We have been using Kerlix/Coban with PolyMem silver and antibiotic ointment. He has no issues or complaints today. There has been improvement in wound healing. Electronic Signature(s) Signed: 08/23/2022 4:36:36 PM By: Geralyn Corwin DO Entered By: Geralyn Sean on 08/23/2022 16:32:06 -------------------------------------------------------------------------------- Physical Exam Details Patient Name: Date of Service: Sean Mccoy 08/23/2022 3:30 PM Medical Record Number: [id number] Patient Account Number: [id number] Date of Birth/Sex: Treating RN: 10-May-1961 (61 y.o. M) Primary Care Provider: Cari Caraway Other Clinician: Referring Provider: Treating Provider/Extender: Johnston Ebbs in Treatment: 6 Constitutional respirations regular, non-labored and within target range for patient.. Cardiovascular 2+ dorsalis pedis/posterior tibialis pulses. Psychiatric pleasant and cooperative. Notes Right foot: T the lateral ankle there is an open wound with epithelization, granulation tissue and scant nonviable tissue. Nonpitting edema to the knee. o Lymphedema skin changes noted. No surrounding signs of infection. Electronic Signature(s) Signed: 08/23/2022 4:36:36 PM By: Geralyn Corwin DO Entered By: Geralyn Sean on  08/23/2022 16:33:27 -------------------------------------------------------------------------------- Physician Orders Details Patient Name: Date of Service: Sean Mccoy 08/23/2022 3:30 PM Medical Record Number: [id number] Patient Account Number: [id number] Date of Birth/Sex: Treating RN: [birth year] (61 y.o. [name] Primary Care Provider: Marlana Latus,  Lupita Leash Other Clinician: Referring Provider: Treating Provider/Extender: Johnston Ebbs in Treatment: 6 Verbal / Phone Orders: No Diagnosis Coding Follow-up Appointments ppointment in 1 week. - 08/31/22 @ 3:30pm with Dr. Mikey Bussing and Lennox Laity, RN (Room 7) Return A Anesthetic (In clinic) Topical Lidocaine 5% applied to wound bed - Prior to debridement (In clinic) Topical Lidocaine 4% applied to wound bed - Prior to debridement Bathing/ Shower/ Hygiene May shower with protection but do not get wound dressing(s) wet. Edema Control - Lymphedema / SCD / Other Elevate legs to the level of the heart or above for 30 minutes daily and/or when sitting, a frequency of: - several times a day Avoid standing for long periods of time. Additional Orders / Instructions Follow Nutritious Diet Wound Treatment Wound #1 - Foot Wound Laterality: Right, Lateral Cleanser: Soap and Water 1 x Per Week/30 Days Discharge Instructions: May shower and wash wound with dial antibacterial soap and water prior to dressing change. Cleanser: Wound Cleanser 1 x Per Week/30 Days Discharge Instructions: Cleanse the wound with wound cleanser prior to applying a clean dressing using gauze sponges, not tissue or cotton balls. Peri-Wound Care: Sween Lotion (Moisturizing lotion) 1 x Per Week/30 Days Discharge Instructions: Apply moisturizing lotion as directed Prim Dressing: PolyMem Silver Non-Adhesive Dressing, 4.25x4.25 in 1 x Per Week/30 Days ary Discharge Instructions: Apply to wound bed as instructed Secondary Dressing: ABD Pad, 8x10 1 x Per Week/30  Days Discharge Instructions: Apply over primary dressing as directed. Compression Wrap: Kerlix Roll 4.5x3.1 (in/yd) 1 x Per Week/30 Days Discharge Instructions: Apply Kerlix and Coban compression as directed. Compression Wrap: Coban Self-Adherent Wrap 4x5 (in/yd) 1 x Per Week/30 Days Discharge Instructions: Apply over Kerlix as directed. Electronic Signature(s) Signed: 08/23/2022 4:36:36 PM By: Geralyn Corwin DO Entered By: Geralyn Sean on 08/23/2022 16:33:36 -------------------------------------------------------------------------------- Problem List Details Patient Name: Date of Service: Sean Mccoy 08/23/2022 3:30 PM Medical Record Number: [id number] Patient Account Number: [id number] Date of Birth/Sex: Treating RN: 02-20-1961 (61 y.o. M) Primary Care Provider: Cari Caraway Other Clinician: Referring Provider: Treating Provider/Extender: Johnston Ebbs in Treatment: 6 Active Problems ICD-10 Encounter Code Description Active Date MDM Diagnosis L97.512 Non-pressure chronic ulcer of other part of right foot with fat layer exposed 07/12/2022 No Yes I87.311 Chronic venous hypertension (idiopathic) with ulcer of right lower extremity 07/12/2022 No Yes Z96.641 Presence of right artificial hip joint 07/12/2022 No Yes Inactive Problems Resolved Problems Electronic Signature(s) Signed: 08/23/2022 4:36:36 PM By: Geralyn Corwin DO Entered By: Geralyn Sean on 08/23/2022 16:30:56 -------------------------------------------------------------------------------- Progress Note Details Patient Name: Date of Service: Sean Mccoy 08/23/2022 3:30 PM Medical Record Number: [id number] Patient Account Number: [id number] Date of Birth/Sex: Treating RN: February 11, 1961 (61 y.o. 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 This is a wound that waxes and wanes in healing. It started 10 months ago. He follows with Dr. Allena Katz, podiatry for this issue. He has been using Betadine wet-to-dry dressings. He has been on several courses of antibiotics for  this issue including doxycycline and Keflex. He states that the wound has been stable for the past several months. He has lymphedema and owns compression stockings but is not currently using them. He denies signs of infection. He also developed a second right toe wound several months ago. He has been using Betadine to this area as well. 8/11; patient presents for follow-up. Unfortunately he missed his last clinic appointments almost been 1 month since have last seen him. 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 Kerlix/Coban was used with PolyMem silver and antibiotic ointment. He states he took this off after 7 days. He is currently been keeping the area covered. He denies signs of infection. We have also been treating a second right toe wound. This is healed. 8/28; patient presents for follow-up. Again he missed his last clinic appointment. He took the wrap off after 7 days. We have been using Kerlix/Coban with PolyMem silver and antibiotic ointment. He has no issues or complaints today. There has been improvement in wound healing. Patient History Information obtained from Patient, Chart. Family History Diabetes - Mother, Stroke - Father, No family history of Cancer, Heart Disease, Hereditary Spherocytosis, Hypertension, Kidney  Disease, Lung Disease, Seizures, Thyroid Problems, Tuberculosis. Social History Never smoker, Marital Status - Single, Alcohol Use - Rarely, Drug Use - No History, Caffeine Use - Rarely. Medical History Cardiovascular Patient has history of Hypertension Musculoskeletal Patient has history of Rheumatoid Arthritis Medical A Surgical History Notes nd Genitourinary Enlarged Prostate Psychiatric Depression, Anxiety, PTSD Objective Constitutional respirations regular, non-labored and within target range for patient.. Vitals Time Taken: 3:55 PM, Height: 70 in, Weight: 218 lbs, BMI: 31.3, Temperature: 98.3 F, Pulse: 96 bpm, Respiratory Rate: 18 breaths/min, Blood Pressure: 144/77 mmHg. Cardiovascular 2+ dorsalis pedis/posterior tibialis pulses. Psychiatric pleasant and cooperative. General Notes: Right foot: T the lateral ankle there is an open wound with epithelization, granulation tissue and scant nonviable tissue. Nonpitting edema to the o knee. Lymphedema skin changes noted. No surrounding signs of infection. Integumentary (Hair, Skin) Wound #1 status is Open. Original cause of wound was Trauma. The date acquired was: 01/18/2022. The wound has been in treatment 6 weeks. The wound is located on the Right,Lateral Foot. The wound measures 0.3cm length x 0.2cm width x 0.1cm depth; 0.047cm^2 area and 0.005cm^3 volume. There is Fat Layer (Subcutaneous Tissue) exposed. There is no tunneling or undermining noted. There is a medium amount of serosanguineous drainage noted. The wound margin is distinct with the outline attached to the wound base. There is large (67-100%) red granulation within the wound bed. There is no necrotic tissue within the wound bed. Assessment Active Problems ICD-10 Non-pressure chronic ulcer of other part of right foot with fat layer exposed Chronic venous hypertension (idiopathic) with ulcer of right lower extremity Presence of right artificial hip joint Patient's  wound has shown improvement in size and appearance since last clinic visit. It is almost healed. I recommended PolyMem silver under Kerlix/Coban. I recommended follow-up in 1 week. He knows to take this off after 7 days if he does not make it in. Plan Follow-up Appointments: Return Appointment in 1 week. - 08/31/22 @ 3:30pm with Dr. Mikey Bussing and Lennox Laity, RN (Room 7) Anesthetic: (In clinic) Topical Lidocaine 5% applied to wound bed - Prior to debridement (In clinic) Topical Lidocaine 4% applied to wound bed - Prior to debridement Bathing/ Shower/ Hygiene: May shower with protection but do not get wound dressing(s) wet. Edema Control - Lymphedema / SCD / Other: Elevate legs  to the level of the heart or above for 30 minutes daily and/or when sitting, a frequency of: - several times a day Avoid standing for long periods of time. Additional Orders / Instructions: Follow Nutritious Diet WOUND #1: - Foot Wound Laterality: Right, Lateral Cleanser: Soap and Water 1 x Per Week/30 Days Discharge Instructions: May shower and wash wound with dial antibacterial soap and water prior to dressing change. Cleanser: Wound Cleanser 1 x Per Week/30 Days Discharge Instructions: Cleanse the wound with wound cleanser prior to applying a clean dressing using gauze sponges, not tissue or cotton balls. Peri-Wound Care: Sween Lotion (Moisturizing lotion) 1 x Per Week/30 Days Discharge Instructions: Apply moisturizing lotion as directed Prim Dressing: PolyMem Silver Non-Adhesive Dressing, 4.25x4.25 in 1 x Per Week/30 Days ary Discharge Instructions: Apply to wound bed as instructed Secondary Dressing: ABD Pad, 8x10 1 x Per Week/30 Days Discharge Instructions: Apply over primary dressing as directed. Com pression Wrap: Kerlix Roll 4.5x3.1 (in/yd) 1 x Per Week/30 Days Discharge Instructions: Apply Kerlix and Coban compression as directed. Com pression Wrap: Coban Self-Adherent Wrap 4x5 (in/yd) 1 x Per Week/30  Days Discharge Instructions: Apply over Kerlix as directed. 1. PolyMem silver under Kerlix/Coban 2. Follow-up in 1 week Electronic Signature(s) Signed: 08/23/2022 4:36:36 PM By: Geralyn Corwin DO Entered By: Geralyn Sean on 08/23/2022 16:35:22 -------------------------------------------------------------------------------- HxROS Details Patient Name: Date of Service: Sean Mccoy 08/23/2022 3:30 PM Medical Record Number: [id number] Patient Account Number: [id number] Date of Birth/Sex: Treating RN: 11-Apr-1961 (61 y.o. M) Primary Care Provider: Cari Caraway Other Clinician: Referring Provider: Treating Provider/Extender: Johnston Ebbs in Treatment: 6 Information Obtained From Patient Chart Cardiovascular Medical History: Positive for: Hypertension Genitourinary Medical History: Past Medical History Notes: Enlarged Prostate Musculoskeletal Medical History: Positive for: Rheumatoid Arthritis Psychiatric Medical History: Past Medical History Notes: Depression, Anxiety, PTSD Immunizations Pneumococcal Vaccine: Received Pneumococcal Vaccination: No Implantable Devices None Family and Social History Cancer: No; Diabetes: Yes - Mother; Heart Disease: No; Hereditary Spherocytosis: No; Hypertension: No; Kidney Disease: No; Lung Disease: No; Seizures: No; Stroke: Yes - Father; Thyroid Problems: No; Tuberculosis: No; Never smoker; Marital Status - Single; Alcohol Use: Rarely; Drug Use: No History; Caffeine Use: Rarely; Financial Concerns: No; Food, Clothing or Shelter Needs: No; Support System Lacking: No; Transportation Concerns: No Electronic Signature(s) Signed: 08/23/2022 4:36:36 PM By: Geralyn Corwin DO Entered By: Geralyn Sean on 08/23/2022 16:32:16 -------------------------------------------------------------------------------- SuperBill Details Patient Name: Date of Service: Sean Mccoy 08/23/2022 Medical Record Number:  [id number] Patient Account Number: [id number] Date of Birth/Sex: Treating RN: 07-Sep-1961 (61 y.o. [name] Primary Care Provider: Cari Caraway Other Clinician: Referring Provider: Treating Provider/Extender: Johnston Ebbs in Treatment: 6 Diagnosis Coding ICD-10 Codes Code Description [phone] Non-pressure chronic ulcer of other part of right foot with fat layer exposed I87.311 Chronic venous hypertension (idiopathic) with ulcer of right lower extremity Z96.641 Presence of right artificial hip joint Facility Procedures CPT4 Code: [id number] Description: 99213 - WOUND CARE VISIT-LEV 3 EST PT Modifier: Quantity: 1 Physician Procedures : CPT4 Code Description Modifier [id number] 99213 - WC PHYS LEVEL 3 - EST PT 1 ICD-10 Diagnosis Description L97.512 Non-pressure chronic ulcer of other part of right foot with fat layer exposed I87.311 Chronic venous hypertension (idiopathic) with ulcer of  right lower extremity Z96.641 Presence of right artificial hip joint Quantity: Electronic Signature(s) Signed: 08/23/2022 4:36:36 PM By: Geralyn Corwin DO Entered By: Geralyn Sean on 08/23/2022 16:35:38

## 2022-08-23 NOTE — Progress Notes (Signed)
KEM, HENSEN ([id number]) Visit Report for 08/23/2022 Arrival Information Details Patient Name: Date of Service: Sean Mccoy 08/23/2022 3:30 PM Medical Record Number: [id number] Patient Account Number: [id number] Date of Birth/Sex: Treating RN: 1961-05-13 (61 y.o. Sean Mccoy Primary Care Lashaunta Sicard: Cari Caraway Other Clinician: Referring Gennesis Hogland: Treating Jordanny Waddington/Extender: Johnston Ebbs in Treatment: 6 Visit Information History Since Last Visit Added or deleted any medications: No Patient Arrived: Ambulatory Any new allergies or adverse reactions: No Arrival Time: 15:52 Had a fall or experienced change in No Transfer Assistance: None activities of daily living that may affect Patient Identification Verified: Yes risk of falls: Secondary Verification Process Completed: Yes Signs or symptoms of abuse/neglect since last visito No Patient Requires Transmission-Based Precautions: No Hospitalized since last visit: No Patient Has Alerts: No Implantable device outside of the clinic excluding No cellular tissue based products placed in the center since last visit: Has Dressing in Place as Prescribed: Yes Has Compression in Place as Prescribed: No Pain Present Now: No Electronic Signature(s) Signed: 08/23/2022 5:12:03 PM By: Antonieta Iba Entered By: Antonieta Iba on 08/23/2022 15:55:13 -------------------------------------------------------------------------------- Clinic Level of Care Assessment Details Patient Name: Date of Service: Sean Mccoy 08/23/2022 3:30 PM Medical Record Number: [id number] Patient Account Number: [id number] Date of Birth/Sex: Treating RN: [birth year] (61 y.o. Sean Mccoy Primary Care Isay Perleberg: Cari Caraway Other Clinician: Referring Kazuto Sevey: Treating Otha Monical/Extender: Johnston Ebbs in Treatment: 6 Clinic Level of Care Assessment Items TOOL 4 Quantity Score X- 1 0 Use when only an EandM is  performed on FOLLOW-UP visit ASSESSMENTS - Nursing Assessment / Reassessment X- 1 10 Reassessment of Co-morbidities (includes updates in patient status) X- 1 5 Reassessment of Adherence to Treatment Plan ASSESSMENTS - Wound and Skin A ssessment / Reassessment X - Simple Wound Assessment / Reassessment - one wound 1 5  - 0 Complex Wound Assessment / Reassessment - multiple wounds  - 0 Dermatologic / Skin Assessment (not related to wound area) ASSESSMENTS - Focused Assessment  - 0 Circumferential Edema Measurements - multi extremities  - 0 Nutritional Assessment / Counseling / Intervention  - 0 Lower Extremity Assessment (monofilament, tuning fork, pulses)  - 0 Peripheral Arterial Disease Assessment (using hand held doppler) ASSESSMENTS - Ostomy and/or Continence Assessment and Care  - 0 Incontinence Assessment and Management  - 0 Ostomy Care Assessment and Management (repouching, etc.) PROCESS - Coordination of Care  - 0 Simple Patient / Family Education for ongoing care X- 1 20 Complex (extensive) Patient / Family Education for ongoing care X- 1 10 Staff obtains Chiropractor, Records, T Results / Process Orders est  - 0 Staff telephones HHA, Nursing Homes / Clarify orders / etc  - 0 Routine Transfer to another Facility (non-emergent condition)  - 0 Routine Hospital Admission (non-emergent condition)  - 0 New Admissions / Manufacturing engineer / Ordering NPWT Apligraf, etc. ,  - 0 Emergency Hospital Admission (emergent condition)  - 0 Simple Discharge Coordination  - 0 Complex (extensive) Discharge Coordination PROCESS - Special Needs  - 0 Pediatric / Minor Patient Management  - 0 Isolation Patient Management  - 0 Hearing / Language / Visual special needs  - 0 Assessment of Community assistance (transportation, D/Mccoy planning, etc.)  - 0 Additional assistance / Altered mentation  - 0 Support Surface(s) Assessment  (bed, cushion, seat, etc.) INTERVENTIONS - Wound Cleansing / Measurement X - Simple Wound Cleansing - one wound 1 5  - 0 Complex Wound Cleansing -  multiple wounds X- 1 5 Wound Imaging (photographs - any number of wounds) []  - 0 Wound Tracing (instead of photographs) X- 1 5 Simple Wound Measurement - one wound []  - 0 Complex Wound Measurement - multiple wounds INTERVENTIONS - Wound Dressings []  - 0 Small Wound Dressing one or multiple wounds []  - 0 Medium Wound Dressing one or multiple wounds X- 1 20 Large Wound Dressing one or multiple wounds []  - 0 Application of Medications - topical []  - 0 Application of Medications - injection INTERVENTIONS - Miscellaneous []  - 0 External ear exam []  - 0 Specimen Collection (cultures, biopsies, blood, body fluids, etc.) []  - 0 Specimen(s) / Culture(s) sent or taken to Lab for analysis []  - 0 Patient Transfer (multiple staff / Nurse, adult / Similar devices) []  - 0 Simple Staple / Suture removal (25 or less) []  - 0 Complex Staple / Suture removal (26 or more) []  - 0 Hypo / Hyperglycemic Management (close monitor of Blood Glucose) []  - 0 Ankle / Brachial Index (ABI) - do not check if billed separately X- 1 5 Vital Signs Has the patient been seen at the hospital within the last three years: Yes Total Score: 90 Level Of Care: New/Established - Level 3 Electronic Signature(s) Signed: 08/23/2022 5:12:03 PM By: Antonieta Iba Entered By: Antonieta Iba on 08/23/2022 16:29:27 -------------------------------------------------------------------------------- Encounter Discharge Information Details Patient Name: Date of Service: Sean Mccoy 08/23/2022 3:30 PM Medical Record Number: [id number] Patient Account Number: [id number] Date of Birth/Sex: Treating RN: 1961/02/20 (61 y.o. Sean Mccoy Primary Care Huck Ashworth: Cari Caraway Other Clinician: Referring Evanee Lubrano: Treating Danett Palazzo/Extender: Johnston Ebbs in  Treatment: 6 Encounter Discharge Information Items Discharge Condition: Stable Ambulatory Status: Ambulatory Discharge Destination: Home Transportation: Other Schedule Follow-up Appointment: Yes Clinical Summary of Care: Provided on 08/23/2022 Form Type Recipient Paper Patient Patient Electronic Signature(s) Signed: 08/23/2022 5:12:03 PM By: Antonieta Iba Entered By: Antonieta Iba on 08/23/2022 16:39:48 -------------------------------------------------------------------------------- Lower Extremity Assessment Details Patient Name: Date of Service: Sean Mccoy 08/23/2022 3:30 PM Medical Record Number: [id number] Patient Account Number: [id number] Date of Birth/Sex: Treating RN: [birth year] (61 y.o. Sean Mccoy Primary Care Kashlyn Salinas: Cari Caraway Other Clinician: Referring Saadia Dewitt: Treating Dezman Granda/Extender: Johnston Ebbs in Treatment: 6 Edema Assessment Assessed: Kyra Searles: No] Franne Forts: Yes] Edema: [Left: Ye] [Right: s] Calf Left: Right: Point of Measurement: 35 cm From Medial Instep 43 cm Ankle Left: Right: Point of Measurement: 13 cm From Medial Instep 30.5 cm Vascular Assessment Pulses: Dorsalis Pedis Palpable: [Right:Yes] Electronic Signature(s) Signed: 08/23/2022 5:12:03 PM By: Antonieta Iba Entered By: Antonieta Iba on 08/23/2022 15:57:39 -------------------------------------------------------------------------------- Multi Wound Chart Details Patient Name: Date of Service: Sean Mccoy 08/23/2022 3:30 PM Medical Record Number: [id number] Patient Account Number: [id number] Date of Birth/Sex: Treating RN: 1961-07-10 (61 y.o. M) Primary Care Dajanay Northrup: Cari Caraway Other Clinician: Referring Layah Skousen: Treating Brianni Manthe/Extender: Johnston Ebbs in Treatment: 6 Vital Signs Height(in): 70 Pulse(bpm): 96 Weight(lbs): 218 Blood Pressure(mmHg): 144/77 Body Mass Index(BMI): 31.3 Temperature(F): 98.3 Respiratory  Rate(breaths/min): 18 Photos: [N/A:N/A] Right, Lateral Foot N/A N/A Wound Location: Trauma N/A N/A Wounding Event: Infection - not elsewhere classified N/A N/A Primary Etiology: Hypertension, Rheumatoid Arthritis N/A N/A Comorbid History: 01/18/2022 N/A N/A Date Acquired: 6 N/A N/A Weeks of Treatment: Open N/A N/A Wound Status: No N/A N/A Wound Recurrence: 0.3x0.2x0.1 N/A N/A Measurements L x W x D (cm) 0.047 N/A N/A A (cm) : rea 0.005 N/A N/A Volume (cm) :  99.00% N/A N/A % Reduction in A rea: 98.90% N/A N/A % Reduction in Volume: Full Thickness Without Exposed N/A N/A Classification: Support Structures Medium N/A N/A Exudate Amount: Serosanguineous N/A N/A Exudate Type: red, brown N/A N/A Exudate Color: Distinct, outline attached N/A N/A Wound Margin: Large (67-100%) N/A N/A Granulation Amount: Red N/A N/A Granulation Quality: None Present (0%) N/A N/A Necrotic Amount: Fat Layer (Subcutaneous Tissue): Yes N/A N/A Exposed Structures: Fascia: No Tendon: No Muscle: No Joint: No Bone: No Large (67-100%) N/A N/A Epithelialization: Treatment Notes Electronic Signature(s) Signed: 08/23/2022 4:36:36 PM By: Geralyn Corwin DO Entered By: Geralyn Sean on 08/23/2022 16:31:02 -------------------------------------------------------------------------------- Multi-Disciplinary Care Plan Details Patient Name: Date of Service: Sean Mccoy 08/23/2022 3:30 PM Medical Record Number: [id number] Patient Account Number: [id number] Date of Birth/Sex: Treating RN: [birth year] (61 y.o. Sean Mccoy Primary Care Ariba Lehnen: Cari Caraway Other Clinician: Referring Adelaida Reindel: Treating Angeline Trick/Extender: Johnston Ebbs in Treatment: 6 Active Inactive Venous Leg Ulcer Nursing Diagnoses: Potential for venous Insuffiency (use before diagnosis confirmed) Goals: Patient will maintain optimal edema control Date Initiated: 07/12/2022 Target  Resolution Date: 09/02/2022 Goal Status: Active Interventions: Assess peripheral edema status every visit. Compression as ordered Provide education on venous insufficiency Notes: Wound/Skin Impairment Nursing Diagnoses: Impaired tissue integrity Goals: Patient/caregiver will verbalize understanding of skin care regimen Date Initiated: 07/12/2022 Target Resolution Date: 09/02/2022 Goal Status: Active Ulcer/skin breakdown will have a volume reduction of 30% by week 4 Date Initiated: 07/12/2022 Target Resolution Date: 08/09/2022 Goal Status: Active Interventions: Assess patient/caregiver ability to obtain necessary supplies Assess patient/caregiver ability to perform ulcer/skin care regimen upon admission and as needed Assess ulceration(s) every visit Provide education on ulcer and skin care Treatment Activities: Topical wound management initiated : 07/12/2022 Notes: Electronic Signature(s) Signed: 08/23/2022 5:12:03 PM By: Antonieta Iba Entered By: Antonieta Iba on 08/23/2022 16:28:13 -------------------------------------------------------------------------------- Pain Assessment Details Patient Name: Date of Service: Sean Mccoy 08/23/2022 3:30 PM Medical Record Number: [id number] Patient Account Number: [id number] Date of Birth/Sex: Treating RN: 12-04-1961 (61 y.o. Sean Mccoy Primary Care Ahkeem Goede: Cari Caraway Other Clinician: Referring Loel Betancur: Treating Khali Perella/Extender: Johnston Ebbs in Treatment: 6 Active Problems Location of Pain Severity and Description of Pain Patient Has Paino No Site Locations Pain Management and Medication Current Pain Management: Electronic Signature(s) Signed: 08/23/2022 5:12:03 PM By: Antonieta Iba Entered By: Antonieta Iba on 08/23/2022 15:57:15 -------------------------------------------------------------------------------- Patient/Caregiver Education Details Patient Name: Date of Service: Sean Mccoy 8/28/2023andnbsp3:30 PM Medical Record Number: [id number] Patient Account Number: [id number] Date of Birth/Gender: Treating RN: Mar 13, 1961 (61 y.o. Sean Mccoy Primary Care Physician: Cari Caraway Other Clinician: Referring Physician: Treating Physician/Extender: Johnston Ebbs in Treatment: 6 Education Assessment Education Provided To: Patient Education Topics Provided Venous: Methods: Explain/Verbal, Printed Responses: State content correctly Wound/Skin Impairment: Methods: Explain/Verbal, Printed Responses: State content correctly Electronic Signature(s) Signed: 08/23/2022 5:12:03 PM By: Antonieta Iba Entered By: Antonieta Iba on 08/23/2022 16:28:31 -------------------------------------------------------------------------------- Wound Assessment Details Patient Name: Date of Service: Sean Mccoy 08/23/2022 3:30 PM Medical Record Number: [id number] Patient Account Number: [id number] Date of Birth/Sex: Treating RN: 20-Aug-1961 (61 y.o. Sean Mccoy Primary Care Cydnee Fuquay: Cari Caraway Other Clinician: Referring Dajuana Palen: Treating Avonte Sensabaugh/Extender: Johnston Ebbs in Treatment: 6 Wound Status Wound Number: 1 Primary Etiology: Infection - not elsewhere classified Wound Location: Right, Lateral Foot Wound Status: Open Wounding Event: Trauma Comorbid History: Hypertension, Rheumatoid Arthritis Date Acquired: 01/18/2022 Weeks Of Treatment: 6 Clustered  Wound: No Photos Wound Measurements Length: (cm) 0.3 Width: (cm) 0.2 Depth: (cm) 0.1 Area: (cm) 0.047 Volume: (cm) 0.005 % Reduction in Area: 99% % Reduction in Volume: 98.9% Epithelialization: Large (67-100%) Tunneling: No Undermining: No Wound Description Classification: Full Thickness Without Exposed Support Structures Wound Margin: Distinct, outline attached Exudate Amount: Medium Exudate Type: Serosanguineous Exudate Color: red, brown Foul Odor After  Cleansing: No Slough/Fibrino No Wound Bed Granulation Amount: Large (67-100%) Exposed Structure Granulation Quality: Red Fascia Exposed: No Necrotic Amount: None Present (0%) Fat Layer (Subcutaneous Tissue) Exposed: Yes Tendon Exposed: No Muscle Exposed: No Joint Exposed: No Bone Exposed: No Treatment Notes Wound #1 (Foot) Wound Laterality: Right, Lateral Cleanser Soap and Water Discharge Instruction: May shower and wash wound with dial antibacterial soap and water prior to dressing change. Wound Cleanser Discharge Instruction: Cleanse the wound with wound cleanser prior to applying a clean dressing using gauze sponges, not tissue or cotton balls. Peri-Wound Care Sween Lotion (Moisturizing lotion) Discharge Instruction: Apply moisturizing lotion as directed Topical Primary Dressing PolyMem Silver Non-Adhesive Dressing, 4.25x4.25 in Discharge Instruction: Apply to wound bed as instructed Secondary Dressing ABD Pad, 8x10 Discharge Instruction: Apply over primary dressing as directed. Secured With Compression Wrap Kerlix Roll 4.5x3.1 (in/yd) Discharge Instruction: Apply Kerlix and Coban compression as directed. Coban Self-Adherent Wrap 4x5 (in/yd) Discharge Instruction: Apply over Kerlix as directed. Compression Stockings Add-Ons Electronic Signature(s) Signed: 08/23/2022 5:12:03 PM By: Antonieta Iba Entered By: Antonieta Iba on 08/23/2022 16:02:04 -------------------------------------------------------------------------------- Vitals Details Patient Name: Date of Service: Sean Mccoy 08/23/2022 3:30 PM Medical Record Number: [id number] Patient Account Number: [id number] Date of Birth/Sex: Treating RN: Feb 26, 1961 (61 y.o. Sean Mccoy Primary Care Jameela Michna: Cari Caraway Other Clinician: Referring Evan Mackie: Treating Marlicia Sroka/Extender: Johnston Ebbs in Treatment: 6 Vital Signs Time Taken: 15:55 Temperature (F): 98.3 Height (in):  70 Pulse (bpm): 96 Weight (lbs): 218 Respiratory Rate (breaths/min): 18 Body Mass Index (BMI): 31.3 Blood Pressure (mmHg): 144/77 Reference Range: 80 - 120 mg / dl Electronic Signature(s) Signed: 08/23/2022 5:12:03 PM By: Antonieta Iba Entered By: Antonieta Iba on 08/23/2022 15:56:48

## 2022-08-31 ENCOUNTER — Encounter (HOSPITAL_BASED_OUTPATIENT_CLINIC_OR_DEPARTMENT_OTHER): Payer: Medicaid Other | Admitting: Internal Medicine

## 2022-09-07 ENCOUNTER — Encounter (HOSPITAL_BASED_OUTPATIENT_CLINIC_OR_DEPARTMENT_OTHER): Payer: Medicaid Other | Attending: Internal Medicine | Admitting: Internal Medicine

## 2022-09-07 DIAGNOSIS — I872 Venous insufficiency (chronic) (peripheral): Secondary | ICD-10-CM | POA: Insufficient documentation

## 2022-09-07 DIAGNOSIS — I10 Essential (primary) hypertension: Secondary | ICD-10-CM | POA: Diagnosis not present

## 2022-09-07 DIAGNOSIS — L97512 Non-pressure chronic ulcer of other part of right foot with fat layer exposed: Secondary | ICD-10-CM | POA: Diagnosis present

## 2022-09-07 DIAGNOSIS — I87311 Chronic venous hypertension (idiopathic) with ulcer of right lower extremity: Secondary | ICD-10-CM | POA: Diagnosis not present

## 2022-09-07 DIAGNOSIS — I890 Lymphedema, not elsewhere classified: Secondary | ICD-10-CM | POA: Insufficient documentation

## 2022-09-07 DIAGNOSIS — M069 Rheumatoid arthritis, unspecified: Secondary | ICD-10-CM | POA: Diagnosis not present

## 2022-09-07 DIAGNOSIS — Z96641 Presence of right artificial hip joint: Secondary | ICD-10-CM

## 2022-09-07 NOTE — Progress Notes (Signed)
JERAN, HILTZ ([id number]) Visit Report for 09/07/2022 Chief Complaint Document Details Patient Name: Date of Service: Sean Mccoy 09/07/2022 3:30 PM Medical Record Number: [id number] Patient Account Number: [id number] Date of Birth/Sex: Treating RN: 1961-10-19 (61 y.o. M) Primary Care Provider: Cari Caraway Other Clinician: Referring Provider: Treating Provider/Extender: Johnston Ebbs in Treatment: 8 Information Obtained from: Patient Chief Complaint 07/12/2022; right ankle wound, Right second medial toe wound Electronic Signature(s) Signed: 09/07/2022 3:59:59 PM By: Geralyn Corwin DO Entered By: Geralyn Sean on 09/07/2022 15:57:19 -------------------------------------------------------------------------------- HPI Details Patient Name: Date of Service: Sean Mccoy, Sean Mccoy 09/07/2022 3:30 PM Medical Record Number: [id number] Patient Account Number: [id number] Date of Birth/Sex: Treating RN: 07-02-1961 (61 y.o. 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 This is a wound that waxes and wanes in healing. It started 10 months ago. He follows with Dr. Allena Katz, podiatry for this issue. He has been using Betadine wet-to-dry dressings. He has been on several courses of antibiotics for this issue including doxycycline and Keflex. He states that the wound has been stable for the past several months. He has lymphedema and owns compression stockings but is not currently using them. He denies signs of infection. He  also developed a second right toe wound several months ago. He has been using Betadine to this area as well. 8/11; patient presents for follow-up. Unfortunately he missed his last clinic appointments almost been 1 month since have last seen him. 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 Kerlix/Coban was used with PolyMem silver and antibiotic ointment. He states he took this off after 7 days. He is currently been keeping the area covered. He denies signs of infection. We have also been treating a second right toe wound. This is healed. 8/28; patient presents for follow-up. Again he missed his last clinic appointment. He took the wrap off after 7 days. We have been using Kerlix/Coban with PolyMem silver and antibiotic ointment. He has no issues or complaints today. There has been improvement in wound healing. 9/12; patient presents for follow-up. He again missed his last clinic appointment. He took the wrap off after 7 days. He reports no drainage over the past week. Prior to this we have been using PolyMem silver under Kerlix/Coban. Electronic Signature(s) Signed: 09/07/2022 3:59:59 PM By: Geralyn Corwin DO Entered By: Geralyn Sean on 09/07/2022 15:57:43 -------------------------------------------------------------------------------- Physical Exam Details Patient Name: Date of Service: Sean Mccoy 09/07/2022 3:30 PM Medical Record Number: [id number] Patient Account Number: [id number] Date of Birth/Sex: Treating RN: 24-Dec-1961 (61 y.o. M) Primary Care Provider: Cari Caraway Other Clinician: Referring Provider: Treating Provider/Extender: Johnston Ebbs in Treatment: 8 Constitutional respirations regular, non-labored and within target range for patient.. Cardiovascular 2+ dorsalis pedis/posterior tibialis pulses. Psychiatric pleasant and cooperative. Notes Right foot: T the lateral ankle there is epithelization to the previous wound site. No open wounds or  draining. Lymphedema skin changes noted. o Electronic Signature(s) Signed: 09/07/2022 3:59:59 PM By: Geralyn Corwin DO Entered By: Geralyn Sean on 09/07/2022 15:58:12 -------------------------------------------------------------------------------- Physician Orders Details Patient Name: Date of Service: Sean Mccoy,  Sean Mccoy 09/07/2022 3:30 PM Medical Record Number: [id number] Patient Account Number: [id number] Date of Birth/Sex: Treating RN: 04-Feb-1961 (61 y.o. [name] Primary Care Provider: Cari Caraway Other Clinician: Referring Provider: Treating Provider/Extender: Johnston Ebbs in Treatment: 8 Verbal / Phone Orders: No Diagnosis Coding Discharge From [facility] Services Discharge from Wound Care Center - No further follow up needed, call if anything changes. Edema Control - Lymphedema / SCD / Other Patient to wear own compression stockings every day. - Apply compression stockings first thing in the morning and remove at bedtime. Electronic Signature(s) Signed: 09/07/2022 3:59:59 PM By: Geralyn Corwin DO Entered By: Geralyn Sean on 09/07/2022 15:58:18 -------------------------------------------------------------------------------- Problem List Details Patient Name: Date of Service: Sean Mccoy, Sean Mccoy 09/07/2022 3:30 PM Medical Record Number: [id number] Patient Account Number: [id number] Date of Birth/Sex: Treating RN: November 02, 1961 (61 y.o. M) Primary Care Provider: Other Clinician: Cari Caraway Referring Provider: Treating Provider/Extender: Johnston Ebbs in Treatment: 8 Active Problems ICD-10 Encounter Code Description Active Date MDM Diagnosis L97.512 Non-pressure chronic ulcer of other part of right foot with fat layer exposed 07/12/2022 No Yes I87.311 Chronic venous hypertension (idiopathic) with ulcer of right lower extremity 07/12/2022 No Yes Z96.641 Presence of right artificial hip joint 07/12/2022 No Yes Inactive  Problems Resolved Problems Electronic Signature(s) Signed: 09/07/2022 3:59:59 PM By: Geralyn Corwin DO Entered By: Geralyn Sean on 09/07/2022 15:57:11 -------------------------------------------------------------------------------- Progress Note Details Patient Name: Date of Service: Sean Mccoy, Sean Mccoy 09/07/2022 3:30 PM Medical Record Number: [id number] Patient Account Number: [id number] Date of Birth/Sex: Treating RN: 02/05/1961 (61 y.o. 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 This is a wound that waxes and wanes in healing. It started 10 months ago. He follows with Dr. Allena Katz, podiatry for this issue. He has been using Betadine wet-to-dry dressings. He has been on several courses of antibiotics for this issue including doxycycline and Keflex. He states that the wound has been stable for the past several months. He has lymphedema and owns compression stockings but is not currently using them. He denies signs of infection. He also developed a second right toe wound several months ago. He has been using Betadine to this area as well. 8/11; patient presents for follow-up. Unfortunately he missed his last clinic appointments almost been 1 month since have last seen him. 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 Kerlix/Coban was used with PolyMem silver and antibiotic ointment. He states he took this off after 7 days.  He is currently been keeping the area covered. He denies signs of infection. We have also been treating a second right toe wound. This is healed. 8/28; patient presents for follow-up. Again he missed his last clinic appointment. He took the wrap off after 7 days. We have been using Kerlix/Coban with PolyMem silver and antibiotic ointment. He has no issues or complaints today. There has been improvement in wound healing. 9/12; patient presents for follow-up. He again missed his last clinic appointment. He took the wrap off  after 7 days. He reports no drainage over the past week. Prior to this we have been using PolyMem silver under Kerlix/Coban. Patient History Information obtained from Patient, Chart. Family History Diabetes - Mother, Stroke - Father, No family history of Cancer, Heart Disease, Hereditary Spherocytosis, Hypertension, Kidney Disease, Lung Disease, Seizures, Thyroid Problems, Tuberculosis. Social History Never smoker, Marital Status - Single, Alcohol Use - Rarely, Drug Use - No History, Caffeine Use - Rarely. Medical History Cardiovascular Patient has history of Hypertension Musculoskeletal Patient has history of Rheumatoid Arthritis Medical A Surgical History Notes nd Genitourinary Enlarged Prostate Psychiatric Depression, Anxiety, PTSD Objective Constitutional respirations regular, non-labored and within target range for patient.. Vitals Time Taken: 3:47 PM, Height: 70 in, Weight: 218 lbs, BMI: 31.3, Temperature: 98 F, Pulse: 115 bpm, Respiratory Rate: 18 breaths/min, Blood Pressure: 147/75 mmHg. Cardiovascular 2+ dorsalis pedis/posterior tibialis pulses. Psychiatric pleasant and cooperative. General Notes: Right foot: T the lateral ankle there is epithelization to the previous wound site. No open wounds or draining. Lymphedema skin changes noted. o Integumentary (Hair, Skin) Wound #1 status is Healed - Epithelialized. Original cause of wound was Trauma. The  date acquired was: 01/18/2022. The wound has been in treatment 8 weeks. The wound is located on the Right,Lateral Foot. The wound measures 0cm length x 0cm width x 0cm depth; 0cm^2 area and 0cm^3 volume. Assessment Active Problems ICD-10 Non-pressure chronic ulcer of other part of right foot with fat layer exposed Chronic venous hypertension (idiopathic) with ulcer of right lower extremity Presence of right artificial hip joint Patient has done well with compression therapy and PolyMem silver. He reports no drainage over the past week. HeStates he has compression stockings. At this time I recommended using compression stockings daily. Plan Discharge From [facility] Services: Discharge from Wound Care Center - No further follow up needed, call if anything changes. Edema Control - Lymphedema / SCD / Other: Patient to wear own compression stockings every day. - Apply compression stockings first thing in the morning and remove at bedtime. 1. Compression stockings daily 2. Discharge from clinic due to closed wound 3. Follow-up as needed Electronic Signature(s) Signed: 09/07/2022 3:59:59 PM By: Geralyn Corwin DO Entered By: Geralyn Sean on 09/07/2022 15:59:11 -------------------------------------------------------------------------------- HxROS Details Patient Name: Date of Service: Sean Mccoy, Sean Mccoy 09/07/2022 3:30 PM Medical Record Number: [id number] Patient Account Number: [id number] Date of Birth/Sex: Treating RN: 09/30/1961 (61 y.o. M) Primary Care Provider: Cari Caraway Other Clinician: Referring Provider: Treating Provider/Extender: Johnston Ebbs in Treatment: 8 Information Obtained From Patient Chart Cardiovascular Medical History: Positive for: Hypertension Genitourinary Medical History: Past Medical History Notes: Enlarged Prostate Musculoskeletal Medical History: Positive for: Rheumatoid Arthritis Psychiatric Medical History: Past Medical History  Notes: Depression, Anxiety, PTSD Immunizations Pneumococcal Vaccine: Received Pneumococcal Vaccination: No Implantable Devices None Family and Social History Cancer: No; Diabetes: Yes - Mother; Heart Disease: No; Hereditary Spherocytosis: No; Hypertension: No; Kidney Disease: No; Lung Disease: No; Seizures: No; Stroke: Yes - Father; Thyroid Problems: No; Tuberculosis: No; Never smoker; Marital Status - Single; Alcohol Use: Rarely; Drug Use: No History; Caffeine Use: Rarely; Financial Concerns: No; Food, Clothing or Shelter Needs: No; Support System Lacking: No; Transportation Concerns: No Electronic Signature(s) Signed: 09/07/2022 3:59:59 PM By: Geralyn Corwin DO Entered By: Geralyn Sean on 09/07/2022 15:57:48 -------------------------------------------------------------------------------- SuperBill Details Patient Name: Date of Service: Sean Mccoy 09/07/2022 Medical Record Number: [id number] Patient Account Number: [id number] Date of Birth/Sex: Treating RN: 08-09-1961 (61 y.o. [name] Primary Care Provider: Marlana Latus,  Lupita Leash Other Clinician: Referring Provider: Treating Provider/Extender: Johnston Ebbs in Treatment: 8 Diagnosis Coding ICD-10 Codes Code Description [phone] Non-pressure chronic ulcer of other part of right foot with fat layer exposed I87.311 Chronic venous hypertension (idiopathic) with ulcer of right lower extremity Z96.641 Presence of right artificial hip joint Facility Procedures CPT4 Code: [id number] Description: [id number] - WOUND CARE VISIT-LEV 2 EST PT Modifier: Quantity: 1 Physician Procedures : CPT4 Code Description Modifier [id number] - WC PHYS LEVEL 3 - EST PT ICD-10 Diagnosis Description L97.512 Non-pressure chronic ulcer of other part of right foot with fat layer exposed I87.311 Chronic venous hypertension (idiopathic) with ulcer of  right lower extremity Z96.641 Presence of right artificial hip joint Quantity:  1 Electronic Signature(s) Signed: 09/07/2022 3:59:59 PM By: Geralyn Corwin DO Entered By: Geralyn Sean on 09/07/2022 15:59:23

## 2022-09-07 NOTE — Progress Notes (Signed)
BRONXTON, ALDOUS ([id number]) Visit Report for 09/07/2022 Arrival Information Details Patient Name: Date of Service: Sean Mccoy 09/07/2022 3:30 PM Medical Record Number: [id number] Patient Account Number: [id number] Date of Birth/Sex: Treating RN: 1961/05/25 (61 y.o. M) Primary Care Cherylee Rawlinson: Cari Caraway Other Clinician: Referring Araiya Tilmon: Treating Talea Manges/Extender: Johnston Ebbs in Treatment: 8 Visit Information History Since Last Visit Added or deleted any medications: No Patient Arrived: Ambulatory Any new allergies or adverse reactions: No Arrival Time: 15:45 Had a fall or experienced change in No Accompanied By: self activities of daily living that may affect Transfer Assistance: None risk of falls: Patient Requires Transmission-Based Precautions: No Signs or symptoms of abuse/neglect since last visito No Patient Has Alerts: No Hospitalized since last visit: No Implantable device outside of the clinic excluding No cellular tissue based products placed in the center since last visit: Has Dressing in Place as Prescribed: Yes Pain Present Now: Yes Electronic Signature(s) Signed: 09/07/2022 4:33:24 PM By: Thayer Dallas Entered By: Thayer Dallas on 09/07/2022 15:47:27 -------------------------------------------------------------------------------- Clinic Level of Care Assessment Details Patient Name: Date of Service: Sean Mccoy 09/07/2022 3:30 PM Medical Record Number: [id number] Patient Account Number: [id number] Date of Birth/Sex: Treating RN: 19-Jun-1961 (61 y.o. [name] Primary Care Coley Littles: Cari Caraway Other Clinician: Referring Kaly Mcquary: Treating Dupree Givler/Extender: Johnston Ebbs in Treatment: 8 Clinic Level of Care Assessment Items TOOL 4 Quantity Score X- 1 0 Use when only an EandM is performed on FOLLOW-UP visit ASSESSMENTS - Nursing Assessment / Reassessment X- 1 10 Reassessment of Co-morbidities  (includes updates in patient status) X- 1 5 Reassessment of Adherence to Treatment Plan ASSESSMENTS - Wound and Skin A ssessment / Reassessment X - Simple Wound Assessment / Reassessment - one wound 1 5 []  - 0 Complex Wound Assessment / Reassessment - multiple wounds []  - 0 Dermatologic / Skin Assessment (not related to wound area) ASSESSMENTS - Focused Assessment []  - 0 Circumferential Edema Measurements - multi extremities []  - 0 Nutritional Assessment / Counseling / Intervention []  - 0 Lower Extremity Assessment (monofilament, tuning fork, pulses) []  - 0 Peripheral Arterial Disease Assessment (using hand held doppler) ASSESSMENTS - Ostomy and/or Continence Assessment and Care []  - 0 Incontinence Assessment and Management []  - 0 Ostomy Care Assessment and Management (repouching, etc.) PROCESS - Coordination of Care X - Simple Patient / Family Education for ongoing care 1 15 []  - 0 Complex (extensive) Patient / Family Education for ongoing care X- 1 10 Staff obtains Chiropractor, Records, T Results / Process Orders est []  - 0 Staff telephones HHA, Nursing Homes / Clarify orders / etc []  - 0 Routine Transfer to another Facility (non-emergent condition) []  - 0 Routine Hospital Admission (non-emergent condition) []  - 0 New Admissions / Manufacturing engineer / Ordering NPWT Apligraf, etc. , []  - 0 Emergency Hospital Admission (emergent condition) X- 1 10 Simple Discharge Coordination []  - 0 Complex (extensive) Discharge Coordination PROCESS - Special Needs []  - 0 Pediatric / Minor Patient Management []  - 0 Isolation Patient Management []  - 0 Hearing / Language / Visual special needs []  - 0 Assessment of Community assistance (transportation, D/C planning, etc.) []  - 0 Additional assistance / Altered mentation []  - 0 Support Surface(s) Assessment (bed, cushion, seat, etc.) INTERVENTIONS - Wound Cleansing / Measurement []  - 0 Simple Wound Cleansing - one wound []   - 0 Complex Wound Cleansing - multiple wounds X- 1 5 Wound Imaging (photographs - any number of wounds) []  -  0 Wound Tracing (instead of photographs) []  - 0 Simple Wound Measurement - one wound []  - 0 Complex Wound Measurement - multiple wounds INTERVENTIONS - Wound Dressings []  - 0 Small Wound Dressing one or multiple wounds []  - 0 Medium Wound Dressing one or multiple wounds []  - 0 Large Wound Dressing one or multiple wounds []  - 0 Application of Medications - topical []  - 0 Application of Medications - injection INTERVENTIONS - Miscellaneous []  - 0 External ear exam []  - 0 Specimen Collection (cultures, biopsies, blood, body fluids, etc.) []  - 0 Specimen(s) / Culture(s) sent or taken to Lab for analysis []  - 0 Patient Transfer (multiple staff / / Similar devices) []  - 0 Simple Staple / Suture removal (25 or less) []  - 0 Complex Staple / Suture removal (26 or more) []  - 0 Hypo / Hyperglycemic Management (close monitor of Blood Glucose) []  - 0 Ankle / Brachial Index (ABI) - do not check if billed separately X- 1 5 Vital Signs Has the patient been seen at the hospital within the last three years: Yes Total Score: 65 Level Of Care: New/Established - Level 2 Electronic Signature(s) Signed: 09/07/2022 4:35:55 PM By: Entered By: on 09/07/2022 15:58:07 -------------------------------------------------------------------------------- Encounter Discharge Information Details Patient Name: Date of Service: A C 09/07/2022 3:30 PM Medical Record Number: Patient Account Number: Date of Birth/Sex: Treating RN: 1961-03-31 (61 y.o. Primary Care Jhon Mallozzi: Nurse, adult Other Clinician: Referring Jahfari Ambers: Treating Tallon Gertz/Extender: in Treatment: 8 Encounter Discharge Information Items Discharge Condition: Stable Ambulatory Status: Ambulatory Discharge  Destination: Home Transportation: Other Schedule Follow-up Appointment: No Clinical Summary of Care: Provided on 09/07/2022 Form Type Recipient Paper Patient Patient Electronic Signature(s) Signed: 09/07/2022 4:35:55 PM By: Entered By: 11/07/2022 on 09/07/2022 15:58:49 -------------------------------------------------------------------------------- Lower Extremity Assessment Details Patient Name: Date of Service: Antonieta Iba 09/07/2022 3:30 PM Medical Record Number: Marcia Brash Patient Account Number: 11/07/2022 Date of Birth/Sex: Treating RN: 11/04/1961 (61 y.o. [id number] Primary Care Hayzlee Mcsorley: 02/17/1961 Other Clinician: Referring Arminda Foglio: Treating Kateleen Encarnacion/Extender: [name] in Treatment: 8 Edema Assessment Assessed: Cari Caraway: No] Johnston Ebbs: Yes] Edema: [Left: Ye] [Right: s] Calf Left: Right: Point of Measurement: 35 cm From Medial Instep 43 cm Ankle Left: Right: Point of Measurement: 13 cm From Medial Instep 31 cm Vascular Assessment Pulses: Dorsalis Pedis Palpable: [Right:Yes] Electronic Signature(s) Signed: 09/07/2022 4:35:55 PM By: 11/07/2022 Entered By: Antonieta Iba on 09/07/2022 15:53:57 -------------------------------------------------------------------------------- Multi Wound Chart Details Patient Name: Date of Service: 11/07/2022 A C 09/07/2022 3:30 PM Medical Record Number: 11/07/2022 Patient Account Number: [id number] Date of Birth/Sex: Treating RN: [birth year] (61 y.o. M) Primary Care Yzabella Crunk: 02/17/1961 Other Clinician: Referring Cristabel Bicknell: Treating Chenay Nesmith/Extender: [name] in Treatment: 8 Vital Signs Height(in): 70 Pulse(bpm): 115 Weight(lbs): 218 Blood Pressure(mmHg): 147/75 Body Mass Index(BMI): 31.3 Temperature(F): 98 Respiratory Rate(breaths/min): 18 Photos: [N/A:N/A] Right, Lateral Foot N/A N/A Wound Location: Trauma N/A N/A Wounding  Event: Infection - not elsewhere classified N/A N/A Primary Etiology: Hypertension, Rheumatoid Arthritis N/A N/A Comorbid History: 01/18/2022 N/A N/A Date Acquired: 8 N/A N/A Weeks of Treatment: Healed - Epithelialized N/A N/A Wound Status: No N/A N/A Wound Recurrence: 0x0x0 N/A N/A Measurements L x W x D (cm) 0 N/A N/A A (cm) : rea 0 N/A N/A Volume (cm) : 100.00% N/A N/A % Reduction in A rea: 100.00% N/A N/A % Reduction  in Volume: Full Thickness Without Exposed N/A N/A Classification: Support Structures Treatment Notes Electronic Signature(s) Signed: 09/07/2022 3:59:59 PM By: Geralyn Corwin DO Entered By: Geralyn Sean on 09/07/2022 15:57:15 -------------------------------------------------------------------------------- Multi-Disciplinary Care Plan Details Patient Name: Date of Service: Sean Mccoy 09/07/2022 3:30 PM Medical Record Number: [id number] Patient Account Number: [id number] Date of Birth/Sex: Treating RN: 03-Oct-1961 (61 y.o. [name] Primary Care Khylan Sawyer: Cari Caraway Other Clinician: Referring Bobie Caris: Treating Brack Shaddock/Extender: Johnston Ebbs in Treatment: 8 Active Inactive Electronic Signature(s) Signed: 09/07/2022 4:35:55 PM By: Antonieta Iba Entered By: Antonieta Iba on 09/07/2022 15:57:18 -------------------------------------------------------------------------------- Pain Assessment Details Patient Name: Date of Service: Sean Mccoy 09/07/2022 3:30 PM Medical Record Number: [id number] Patient Account Number: [id number] Date of Birth/Sex: Treating RN: 05-24-1961 (61 y.o. M) Primary Care Jaclynn Laumann: Cari Caraway Other Clinician: Referring Terrion Poblano: Treating Charle Mclaurin/Extender: Johnston Ebbs in Treatment: 8 Active Problems Location of Pain Severity and Description of Pain Patient Has Paino Yes Site Locations Rate the pain. Current Pain Level: 9 Pain Management and  Medication Current Pain Management: Electronic Signature(s) Signed: 09/07/2022 4:33:24 PM By: Thayer Dallas Entered By: Thayer Dallas on 09/07/2022 15:48:05 -------------------------------------------------------------------------------- Patient/Caregiver Education Details Patient Name: Date of Service: Sean Mccoy 9/12/2023andnbsp3:30 PM Medical Record Number: [id number] Patient Account Number: [id number] Date of Birth/Gender: Treating RN: 14-Mar-1961 (61 y.o. [name] Primary Care Physician: Cari Caraway Other Clinician: Referring Physician: Treating Physician/Extender: Johnston Ebbs in Treatment: 8 Education Assessment Education Provided To: Patient Education Topics Provided Venous: Methods: Explain/Verbal, Printed Responses: State content correctly Electronic Signature(s) Signed: 09/07/2022 4:35:55 PM By: Antonieta Iba Entered By: Antonieta Iba on 09/07/2022 15:57:36 -------------------------------------------------------------------------------- Wound Assessment Details Patient Name: Date of Service: Sean Mccoy 09/07/2022 3:30 PM Medical Record Number: [id number] Patient Account Number: [id number] Date of Birth/Sex: Treating RN: [birth year] (61 y.o. [name] Primary Care Trinda Harlacher: Cari Caraway Other Clinician: Referring Dene Nazir: Treating Lealon Vanputten/Extender: Johnston Ebbs in Treatment: 8 Wound Status Wound Number: 1 Primary Etiology: Infection - not elsewhere classified Wound Location: Right, Lateral Foot Wound Status: Healed - Epithelialized Wounding Event: Trauma Comorbid History: Hypertension, Rheumatoid Arthritis Date Acquired: 01/18/2022 Weeks Of Treatment: 8 Clustered Wound: No Photos Wound Measurements Length: (cm) 0 Width: (cm) 0 Depth: (cm) 0 Area: (cm) 0 Volume: (cm) 0 % Reduction in Area: 100% % Reduction in Volume: 100% Wound Description Classification: Full Thickness  Without Exposed Support Structur es Electronic Signature(s) Signed: 09/07/2022 4:35:55 PM By: Antonieta Iba Entered By: Antonieta Iba on 09/07/2022 15:55:37 -------------------------------------------------------------------------------- Vitals Details Patient Name: Date of Service: [name], [name] 09/07/2022 3:30 PM Medical Record Number: [id number] Patient Account Number: [id number] Date of Birth/Sex: Treating RN: 1961-08-27 (61 y.o. M) Primary Care Vauda Salvucci: Cari Caraway Other Clinician: Referring Concha Sudol: Treating Era Parr/Extender: Johnston Ebbs in Treatment: 8 Vital Signs Time Taken: 15:47 Temperature (F): 98 Height (in): 70 Pulse (bpm): 115 Weight (lbs): 218 Respiratory Rate (breaths/min): 18 Body Mass Index (BMI): 31.3 Blood Pressure (mmHg): 147/75 Reference Range: 80 - 120 mg / dl Electronic Signature(s) Signed: 09/07/2022 4:33:24 PM By: Thayer Dallas Entered By: Thayer Dallas on 09/07/2022 15:47:52

## 2023-03-03 ENCOUNTER — Ambulatory Visit
Admission: RE | Admit: 2023-03-03 | Discharge: 2023-03-03 | Disposition: A | Discharge status: Home / Self Care | Payer: Medicare (Managed Care) | Source: Ambulatory Visit | Attending: Internal Medicine | Admitting: Internal Medicine

## 2023-03-03 ENCOUNTER — Other Ambulatory Visit: Payer: Self-pay | Admitting: Internal Medicine

## 2023-03-03 DIAGNOSIS — E1169 Type 2 diabetes mellitus with other specified complication: Secondary | ICD-10-CM

## 2023-03-28 ENCOUNTER — Encounter (HOSPITAL_BASED_OUTPATIENT_CLINIC_OR_DEPARTMENT_OTHER): Payer: 59 | Attending: General Surgery | Admitting: General Surgery

## 2023-03-28 DIAGNOSIS — I890 Lymphedema, not elsewhere classified: Secondary | ICD-10-CM | POA: Diagnosis not present

## 2023-03-28 DIAGNOSIS — I10 Essential (primary) hypertension: Secondary | ICD-10-CM | POA: Diagnosis not present

## 2023-03-28 DIAGNOSIS — Z96641 Presence of right artificial hip joint: Secondary | ICD-10-CM | POA: Insufficient documentation

## 2023-03-28 DIAGNOSIS — L97312 Non-pressure chronic ulcer of right ankle with fat layer exposed: Secondary | ICD-10-CM | POA: Diagnosis not present

## 2023-03-28 DIAGNOSIS — L97512 Non-pressure chronic ulcer of other part of right foot with fat layer exposed: Secondary | ICD-10-CM | POA: Diagnosis not present

## 2023-03-28 DIAGNOSIS — I872 Venous insufficiency (chronic) (peripheral): Secondary | ICD-10-CM | POA: Insufficient documentation

## 2023-03-29 NOTE — Progress Notes (Signed)
[place], [place] ([id number]) [id number] Page 1 of 9 Visit Report for 03/28/2023 Allergy List Details Patient Name: Date of Service: Sean Mccoy 03/28/2023 12:30 PM Medical Record Number: [id number] Patient Account Number: [id number] Date of Birth/Sex: Treating RN: 1961/10/20 (62 y.o. Janyth Contes Primary Care Lalena Salas: Volney Presser Other Clinician: Referring Abbigale Mcelhaney: Treating Takiyah Bohnsack/Extender: Sumner Boast in Treatment: 0 Allergies Active Allergies No Known Allergies Allergy Notes Electronic Signature(s) Signed: 03/28/2023 4:49:56 PM By: Adline Peals Entered By: Adline Peals on 03/28/2023 11:27:23 -------------------------------------------------------------------------------- Arrival Information Details Patient Name: Date of Service: Sean Mccoy, Sean Mccoy 03/28/2023 12:30 PM Medical Record Number: [id number] Patient Account Number: [id number] Date of Birth/Sex: Treating RN: 03-20-1961 (62 y.o. Janyth Contes Primary Care Bretta Fees: Volney Presser Other Clinician: Referring Darald Uzzle: Treating Analya Louissaint/Extender: Sumner Boast in Treatment: 0 Visit Information Patient Arrived: Ambulatory Arrival Time: 12:26 Accompanied By: self Transfer Assistance: None Patient Identification Verified: Yes Secondary Verification Process Completed: Yes History Since Last Visit Electronic Signature(s) Signed: 03/28/2023 4:49:56 PM By: Adline Peals Entered By: Adline Peals on 03/28/2023 12:32:02 -------------------------------------------------------------------------------- Clinic Level of Care Assessment Details Patient Name: Date of Service: Sean Mccoy 03/28/2023 12:30 PM Medical Record Number: [id number] Patient Account Number: [id number] Date of Birth/Sex: Treating RN: 04/14/1961 (62 y.o. Janyth Contes Primary Care Jyron Turman: Volney Presser Other Clinician: Referring Leiby Pigeon: Treating  Leigh Kaeding/Extender: Sumner Boast in Treatment: 0 Clinic Level of Care Assessment Items TOOL 1 Quantity Score X- 1 0 Use when EandM and Procedure is performed on INITIAL visit ASSESSMENTS - Nursing Assessment / Reassessment X- 1 20 General Physical Exam (combine w/ comprehensive assessment (listed just below) when performed on new pt. evals) X- 1 25 Comprehensive Assessment (HX, ROS, Risk Assessments, Wounds Hx, etc.) ASSESSMENTS - Wound and Skin Assessment / Reassessment []  - 0 Dermatologic / Skin Assessment (not related to wound area) Hall, [place] ([id number] Page 2 of 9 ASSESSMENTS - Ostomy and/or Continence Assessment and Care []  - 0 Incontinence Assessment and Management []  - 0 Ostomy Care Assessment and Management (repouching, etc.) PROCESS - Coordination of Care X - Simple Patient / Family Education for ongoing care 1 15 []  - 0 Complex (extensive) Patient / Family Education for ongoing care X- 1 10 Staff obtains Programmer, systems, Records, T Results / Process Orders est []  - 0 Staff telephones HHA, Nursing Homes / Clarify orders / etc []  - 0 Routine Transfer to another Facility (non-emergent condition) []  - 0 Routine Hospital Admission (non-emergent condition) X- 1 15 New Admissions / Biomedical engineer / Ordering NPWT Apligraf, etc. , []  - 0 Emergency Hospital Admission (emergent condition) PROCESS - Special Needs []  - 0 Pediatric / Minor Patient Management []  - 0 Isolation Patient Management []  - 0 Hearing / Language / Visual special needs []  - 0 Assessment of Community assistance (transportation, D/Mccoy planning, etc.) []  - 0 Additional assistance / Altered mentation []  - 0 Support Surface(s) Assessment (bed, cushion, seat, etc.) INTERVENTIONS - Miscellaneous []  - 0 External ear exam []  - 0 Patient Transfer (multiple staff / Civil Service fast streamer / Similar devices) []  - 0 Simple Staple / Suture removal (25  or less) []  - 0 Complex Staple / Suture removal (26 or more) []  - 0 Hypo/Hyperglycemic Management (do not check if billed separately) X- 1 15 Ankle / Brachial Index (ABI) - do not check if billed separately Has the patient been seen at the hospital within the last three years: Yes Total Score: 100  Level Of Care: New/Established - Level 3 Electronic Signature(s) Signed: 03/28/2023 4:49:56 PM By: Adline Peals Entered By: Adline Peals on 03/28/2023 13:29:10 -------------------------------------------------------------------------------- Encounter Discharge Information Details Patient Name: Date of Service: Sean Mccoy 03/28/2023 12:30 PM Medical Record Number: [id number] Patient Account Number: [id number] Date of Birth/Sex: Treating RN: 07/08/1961 (62 y.o. Janyth Contes Primary Care Ehsan Corvin: Volney Presser Other Clinician: Referring Killian Ress: Treating Matthew Pais/Extender: Sumner Boast in Treatment: 0 Encounter Discharge Information Items Post Procedure Vitals Discharge Condition: Stable Temperature (F): 98.7 Ambulatory Status: Ambulatory Pulse (bpm): 114 Discharge Destination: Home Respiratory Rate (breaths/min): 16 Transportation: Private Auto Blood Pressure (mmHg): 197/89 Accompanied By: self Schedule Follow-up Appointment: Yes Clinical Summary of Care: Patient Declined Electronic Signature(s) [place], [place] ([id number] Page 3 of 9 Signed: 03/28/2023 4:49:56 PM By: Sabas Sous By: Adline Peals on 03/28/2023 13:41:54 -------------------------------------------------------------------------------- Lower Extremity Assessment Details Patient Name: Date of Service: Sean Mccoy 03/28/2023 12:30 PM Medical Record Number: [id number] Patient Account Number: [id number] Date of Birth/Sex: Treating RN: 01-Nov-1961 (62 y.o. Janyth Contes Primary Care Jillane Po: Volney Presser Other  Clinician: Referring Kalise Fickett: Treating Jalaina Salyers/Extender: Sumner Boast in Treatment: 0 Edema Assessment Assessed: Shirlyn Goltz: No] [Right: No] [Left: Edema] [Right: :] Calf Left: Right: Point of Measurement: From Medial Instep 39.5 cm Ankle Left: Right: Point of Measurement: From Medial Instep 27.5 cm Vascular Assessment Pulses: Dorsalis Pedis Palpable: [Right:No] Blood Pressure: Brachial: [Right:197] Ankle: [Right:Dorsalis Pedis: 190 0.96] Electronic Signature(s) Signed: 03/28/2023 4:49:56 PM By: Adline Peals Entered By: Adline Peals on 03/28/2023 12:45:36 -------------------------------------------------------------------------------- Multi Wound Chart Details Patient Name: Date of Service: Sean Mccoy 03/28/2023 12:30 PM Medical Record Number: [id number] Patient Account Number: [id number] Date of Birth/Sex: Treating RN: December 07, 1961 (62 y.o. M) Primary Care Macalister Arnaud: Volney Presser Other Clinician: Referring Tareva Leske: Treating Manasseh Pittsley/Extender: Sumner Boast in Treatment: 0 Vital Signs Height(in): 70 Pulse(bpm): 114 Weight(lbs): 205 Blood Pressure(mmHg): 197/89 Body Mass Index(BMI): 29.4 Temperature(F): 98.7 Respiratory Rate(breaths/min): 16 [3:Photos:] [N/A:N/A] [place], Sean Mccoy ([id number]) [3:Right, Dorsal Foot Wound Location: Gradually Appeared Wounding Event: T be determined o Primary Etiology: Hypertension, Rheumatoid Arthritis Comorbid History: 09/26/2022 Date Acquired: 0 Weeks of Treatment: Open Wound Status: No Wound Recurrence:  6x9.3x0.1 Measurements L x W x D (cm) 43.825 A (cm) : rea 4.383 Volume (cm) : Full Thickness Without Exposed Classification: Support Structures Medium Exudate Amount: Serosanguineous Exudate Type: red, brown Exudate Color: Yes Foul Odor A Cleansing:  fter No Odor Anticipated Due to Product Use: Distinct, outline attached Wound Margin: Small (1-33%) Granulation A mount: Red, Pink  Granulation Quality: Large (67-100%) Necrotic Amount: Fat Layer (Subcutaneous Tissue): Yes Fat Layer (Subcutaneous Tissue):  Yes N/A Exposed Structures: Fascia: No Tendon: No Muscle: No Joint: No Bone: No None Epithelialization: Debridement - Selective/Open Wound N/A Debridement: Pre-procedure Verification/Time Out 13:01 Taken: Lidocaine 4% Topical Solution Pain Control:  Slough Tissue Debrided: Non-Viable Tissue Level: 16 Debridement A (sq cm): rea Curette Instrument: Minimum Bleeding: Pressure Hemostasis A chieved: Procedure was tolerated well Debridement Treatment Response: 6x9.3x0.1 Post Debridement Measurements L x W  x D (cm) 4.383 Post Debridement Volume: (cm) No Abnormalities Noted Periwound Skin Texture: No Abnormalities Noted Periwound Skin Moisture: No Abnormalities Noted Periwound Skin Color: No Abnormality Temperature: Yes Tenderness on Palpation:  Debridement Procedures Performed:] [4:Right T Fifth oe Skin Tear/Laceration Abrasion Hypertension, Rheumatoid Arthritis 03/28/2023 0 Open No 0.5x0.6x0.1 0.236 0.024 Full Thickness Without Exposed Support Structures Medium Serosanguineous red, brown No N/A  Distinct, outline attached  Large (67-100%) Red None Present (0%) Fascia: No Tendon: No Muscle: No Joint: No Bone: No None N/A N/A N/A N/A N/A N/A N/A N/A N/A N/A N/A No Abnormalities Noted No Abnormalities Noted No Abnormalities Noted No Abnormality Yes  N/A] [[id number] Page 4 of 9 N/A N/A N/A N/A N/A N/A N/A N/A N/A N/A N/A N/A N/A N/A N/A N/A N/A N/A N/A N/A N/A N/A N/A N/A N/A N/A N/A N/A N/A N/A N/A N/A N/A N/A N/A N/A N/A N/A N/A N/A] Treatment Notes Electronic Signature(s) Signed: 03/28/2023 1:20:22 PM By: Fredirick Maudlin MD FACS Entered By: Fredirick Maudlin on 03/28/2023 13:20:22 -------------------------------------------------------------------------------- Multi-Disciplinary Care Plan Details Patient Name: Date of Service: Sean Mccoy 03/28/2023 12:30  PM Medical Record Number: [id number] Patient Account Number: [id number] Date of Birth/Sex: Treating RN: 08-16-1961 (62 y.o. Janyth Contes Primary Care Shirah Roseman: Volney Presser Other Clinician: Referring Angelicia Lessner: Treating Rowen Hur/Extender: Sumner Boast in Treatment: 0 Active Inactive Necrotic Tissue Nursing Diagnoses: Impaired tissue integrity related to necrotic/devitalized tissue Knowledge deficit related to management of necrotic/devitalized tissue Goals: DYSHON, ENGERT ([id number] Page 5 of 9 Necrotic/devitalized tissue will be minimized in the wound bed Date Initiated: 03/28/2023 Target Resolution Date: 06/03/2023 Goal Status: Active Patient/caregiver will verbalize understanding of reason and process for debridement of necrotic tissue Date Initiated: 03/28/2023 Target Resolution Date: 06/03/2023 Goal Status: Active Interventions: Assess patient pain level pre-, during and post procedure and prior to discharge Provide education on necrotic tissue and debridement process Treatment Activities: Apply topical anesthetic as ordered : 03/28/2023 Notes: Wound/Skin Impairment Nursing Diagnoses: Impaired tissue integrity Knowledge deficit related to ulceration/compromised skin integrity Goals: Patient/caregiver will verbalize understanding of skin care regimen Date Initiated: 03/28/2023 Target Resolution Date: 05/07/2023 Goal Status: Active Interventions: Assess ulceration(s) every visit Treatment Activities: Skin care regimen initiated : 03/28/2023 Topical wound management initiated : 03/28/2023 Notes: Electronic Signature(s) Signed: 03/28/2023 4:49:56 PM By: Adline Peals Entered By: Adline Peals on 03/28/2023 13:28:39 -------------------------------------------------------------------------------- Pain Assessment Details Patient Name: Date of Service: Sean Mccoy 03/28/2023 12:30 PM Medical Record Number:  [id number] Patient Account Number: [id number] Date of Birth/Sex: Treating RN: 1961-12-09 (62 y.o. Janyth Contes Primary Care Letha Mirabal: Volney Presser Other Clinician: Referring Mister Krahenbuhl: Treating Redell Bhandari/Extender: Sumner Boast in Treatment: 0 Active Problems Location of Pain Severity and Description of Pain Patient Has Paino Yes Site Locations Pain Location: Pain in Ulcers Duration of the Pain. Constant / Intermittento Intermittent Rate the pain. Current Pain Level: 8 Character of Pain Stagliano, Author ([id number]) [id number] Page 6 of 9 Describe the Pain: Sharp, Tender Pain Management and Medication Current Pain Management: Medication: Yes Electronic Signature(s) Signed: 03/28/2023 4:49:56 PM By: Adline Peals Entered By: Adline Peals on 03/28/2023 12:37:03 -------------------------------------------------------------------------------- Patient/Caregiver Education Details Patient Name: Date of Service: Sean Mccoy 4/1/2024andnbsp12:30 PM Medical Record Number: [id number] Patient Account Number: [id number] Date of Birth/Gender: Treating RN: [birth year] (62 y.o. Janyth Contes Primary Care Physician: Volney Presser Other Clinician: Referring Physician: Treating Physician/Extender: Sumner Boast in Treatment: 0 Education Assessment Education Provided To: Patient Education Topics Provided Wound/Skin Impairment: Methods: Explain/Verbal Responses: Reinforcements needed, State content correctly Electronic Signature(s) Signed: 03/28/2023 4:49:56 PM By: Adline Peals Entered By: Adline Peals on 03/28/2023 13:28:48 -------------------------------------------------------------------------------- Wound Assessment Details Patient Name: Date of Service: Sean Mccoy 03/28/2023 12:30 PM Medical Record Number: [id number] Patient Account Number: [id number] Date of Birth/Sex: Treating  RN: November 01, 1961 (62 y.o. Janyth Contes Primary Care Laketra Bowdish: Gwenyth Bouillon,  Butch Penny Other Clinician: Referring Forestine Macho: Treating Ardelia Wrede/Extender: Sumner Boast in Treatment: 0 Wound Status Wound Number: 3 Primary Etiology: T be determined o Wound Location: Right, Dorsal Foot Wound Status: Open Wounding Event: Gradually Appeared Comorbid History: Hypertension, Rheumatoid Arthritis Date Acquired: 09/26/2022 Weeks Of Treatment: 0 Clustered Wound: No Photos Kinnelon, Sean Mccoy ([id number] Page 7 of 9 Wound Measurements Length: (cm) 6 Width: (cm) 9.3 Depth: (cm) 0.1 Area: (cm) 43.825 Volume: (cm) 4.383 % Reduction in Area: % Reduction in Volume: Epithelialization: None Tunneling: No Undermining: No Wound Description Classification: Full Thickness Without Exposed Support Structures Wound Margin: Distinct, outline attached Exudate Amount: Medium Exudate Type: Serosanguineous Exudate Color: red, brown Foul Odor After Cleansing: Yes Due to Product Use: No Slough/Fibrino Yes Wound Bed Granulation Amount: Small (1-33%) Exposed Structure Granulation Quality: Red, Pink Fascia Exposed: No Necrotic Amount: Large (67-100%) Fat Layer (Subcutaneous Tissue) Exposed: Yes Necrotic Quality: Adherent Slough Tendon Exposed: No Muscle Exposed: No Joint Exposed: No Bone Exposed: No Periwound Skin Texture Texture Color No Abnormalities Noted: Yes No Abnormalities Noted: Yes Moisture Temperature / Pain No Abnormalities Noted: Yes Temperature: No Abnormality Tenderness on Palpation: Yes Treatment Notes Wound #3 (Foot) Wound Laterality: Dorsal, Right Cleanser Soap and Water Discharge Instruction: May shower and wash wound with dial antibacterial soap and water prior to dressing change. Wound Cleanser Discharge Instruction: Cleanse the wound with wound cleanser prior to applying a clean dressing using gauze sponges, not tissue or  cotton balls. Peri-Wound Care Topical Gentamicin Discharge Instruction: As directed by physician Ketoconazole Cream 2% Discharge Instruction: Apply Ketoconazole as directed Primary Dressing Secondary Dressing T Non-Adherent Dressing, 3x4 in elfa Discharge Instruction: Apply over primary dressing as directed. Woven Gauze Sponge, Non-Sterile 4x4 in Discharge Instruction: Apply over primary dressing as directed. Secured With [employer], 4.5x3.1 (in/yd) Discharge Instruction: Secure with Kerlix as directed. 36M Medipore H Soft Cloth Surgical T ape, 4 x 10 (in/yd) Discharge Instruction: Secure with tape as directed. Compression Wrap Compression Stockings Add-Ons Electronic Signature(s) Signed: 03/28/2023 4:49:56 PM By: Adline Peals Entered By: Adline Peals on 03/28/2023 12:52:04 Sean Mccoy, Sean Mccoy ([id number] Page 8 of 9 -------------------------------------------------------------------------------- Wound Assessment Details Patient Name: Date of Service: Sean Mccoy 03/28/2023 12:30 PM Medical Record Number: [id number] Patient Account Number: [id number] Date of Birth/Sex: Treating RN: Jul 18, 1961 (62 y.o. Janyth Contes Primary Care Willer Osorno: Volney Presser Other Clinician: Referring Laila Myhre: Treating Brodi Nery/Extender: Sumner Boast in Treatment: 0 Wound Status Wound Number: 4 Primary Etiology: Abrasion Wound Location: Right T Fifth oe Wound Status: Open Wounding Event: Skin Tear/Laceration Comorbid History: Hypertension, Rheumatoid Arthritis Date Acquired: 03/28/2023 Weeks Of Treatment: 0 Clustered Wound: No Photos Wound Measurements Length: (cm) 0.5 Width: (cm) 0.6 Depth: (cm) 0.1 Area: (cm) 0.236 Volume: (cm) 0.024 % Reduction in Area: % Reduction in Volume: Epithelialization: None Tunneling: No Undermining: No Wound Description Classification: Full Thickness Without Exposed  Support Structures Wound Margin: Distinct, outline attached Exudate Amount: Medium Exudate Type: Serosanguineous Exudate Color: red, brown Foul Odor After Cleansing: No Slough/Fibrino No Wound Bed Granulation Amount: Large (67-100%) Exposed Structure Granulation Quality: Red Fascia Exposed: No Necrotic Amount: None Present (0%) Fat Layer (Subcutaneous Tissue) Exposed: Yes Tendon Exposed: No Muscle Exposed: No Joint Exposed: No Bone Exposed: No Periwound Skin Texture Texture Color No Abnormalities Noted: Yes No Abnormalities Noted: Yes Moisture Temperature / Pain No Abnormalities Noted: Yes Temperature: No Abnormality Tenderness on Palpation: Yes Treatment Notes Wound #4 (Toe Fifth) Wound Laterality: Right Cleanser Soap and Water Discharge  Instruction: May shower and wash wound with dial antibacterial soap and water prior to dressing change. Wound Cleanser Discharge Instruction: Cleanse the wound with wound cleanser prior to applying a clean dressing using gauze sponges, not tissue or cotton balls. [place], [place] ([id number]) [id number] Page 9 of 9 Peri-Wound Care Topical Gentamicin Discharge Instruction: As directed by physician Ketoconazole Cream 2% Discharge Instruction: Apply Ketoconazole as directed Primary Dressing Secondary Dressing T Non-Adherent Dressing, 3x4 in elfa Discharge Instruction: Apply over primary dressing as directed. Woven Gauze Sponge, Non-Sterile 4x4 in Discharge Instruction: Apply over primary dressing as directed. Secured With [employer], 4.5x3.1 (in/yd) Discharge Instruction: Secure with Kerlix as directed. 76M Medipore H Soft Cloth Surgical T ape, 4 x 10 (in/yd) Discharge Instruction: Secure with tape as directed. Compression Wrap Compression Stockings Add-Ons Electronic Signature(s) Signed: 03/28/2023 4:49:56 PM By: Adline Peals Entered By: Adline Peals on 03/28/2023  12:52:24 -------------------------------------------------------------------------------- Vitals Details Patient Name: Date of Service: Sean Mccoy, Sean Mccoy 03/28/2023 12:30 PM Medical Record Number: [id number] Patient Account Number: [id number] Date of Birth/Sex: Treating RN: 10-May-1961 (62 y.o. Janyth Contes Primary Care Ledon Weihe: Volney Presser Other Clinician: Referring Natalina Wieting: Treating Masao Junker/Extender: Sumner Boast in Treatment: 0 Vital Signs Time Taken: 12:31 Temperature (F): 98.7 Height (in): 70 Pulse (bpm): 114 Source: Stated Respiratory Rate (breaths/min): 16 Weight (lbs): 205 Blood Pressure (mmHg): 197/89 Source: Stated Reference Range: 80 - 120 mg / dl Body Mass Index (BMI): 29.4 Notes pt stated he did not take his BP medication Electronic Signature(s) Signed: 03/28/2023 4:49:56 PM By: Adline Peals Entered By: Adline Peals on 03/28/2023 12:31:56

## 2023-03-29 NOTE — Progress Notes (Signed)
[place], [place] ([id number]) [id number] Page 1 of 10 Visit Report for 03/28/2023 Chief Complaint Document Details Patient Name: Date of Service: Sean Mccoy 03/28/2023 12:30 PM Medical Record Number: [id number] Patient Account Number: [id number] Date of Birth/Sex: Treating RN: Mar 26, 1961 (62 y.o. M) Primary Care Provider: Volney Presser Other Clinician: Referring Provider: Treating Provider/Extender: Sumner Boast in Treatment: 0 Information Obtained from: Patient Chief Complaint 07/12/2022; right ankle wound, Right second medial toe wound 03/28/2023: right foot wound Electronic Signature(s) Signed: 03/28/2023 1:20:41 PM By: Fredirick Maudlin MD FACS Entered By: Fredirick Maudlin on 03/28/2023 13:20:41 -------------------------------------------------------------------------------- Debridement Details Patient Name: Date of Service: Sean Mccoy, Sean Mccoy 03/28/2023 12:30 PM Medical Record Number: [id number] Patient Account Number: [id number] Date of Birth/Sex: Treating RN: 1961/12/18 (62 y.o. Sean Mccoy Primary Care Provider: Volney Presser Other Clinician: Referring Provider: Treating Provider/Extender: Sumner Boast in Treatment: 0 Debridement Performed for Assessment: Wound #3 Right,Dorsal Foot Performed By: Physician Fredirick Maudlin, MD Debridement Type: Debridement Level of Consciousness (Pre-procedure): Awake and Alert Pre-procedure Verification/Time Out Yes - 13:01 Taken: Start Time: 13:01 Pain Control: Lidocaine 4% T opical Solution T Area Debrided (L x W): otal 4 (cm) x 4 (cm) = 16 (cm) Tissue and other material debrided: Non-Viable, [place] Level: Non-Viable Tissue Debridement Description: Selective/Open Wound Instrument: Curette Bleeding: Minimum Hemostasis Achieved: Pressure Response to Treatment: Procedure was tolerated well Level of Consciousness (Post- Awake and Alert procedure): Post  Debridement Measurements of Total Wound Length: (cm) 6 Width: (cm) 9.3 Depth: (cm) 0.1 Volume: (cm) 4.383 Character of Wound/Ulcer Post Debridement: Improved Post Procedure Diagnosis Same as Pre-procedure Notes scribed for Dr. Celine Ahr by Adline Peals, RN Electronic Signature(s) Signed: 03/28/2023 1:27:39 PM By: Fredirick Maudlin MD FACS Signed: 03/28/2023 4:49:56 PM By: Adline Peals Entered By: Adline Peals on 03/28/2023 13:07:14 [place], [place] ([id number] Page 2 of 10 -------------------------------------------------------------------------------- HPI Details Patient Name: Date of Service: Sean Mccoy 03/28/2023 12:30 PM Medical Record Number: [id number] Patient Account Number: [id number] Date of Birth/Sex: Treating RN: Feb 24, 1961 (62 y.o. 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 This is a wound that waxes and wanes in healing. It started 10 months ago. He follows with Dr. Posey Pronto, podiatry for this issue. He has been using Betadine wet-to-dry dressings. He has been on several courses of antibiotics for this issue including doxycycline and Keflex. He states that the wound has been stable for the past several months. He has lymphedema and owns compression stockings but is not currently using them. He denies signs of infection. He also developed a second right toe wound several months ago. He has been using Betadine to this area as well. 8/11; patient presents for  follow-up. Unfortunately he missed his last clinic appointments almost been 1 month since have last seen him. 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 Kerlix/Coban was used with PolyMem silver and antibiotic ointment. He states he took this off after 7 days. He is currently been keeping the area covered. He denies signs of infection. We have also been treating a second right toe wound. This  is healed. 8/28; patient presents for follow-up. Again he missed his last clinic appointment. He took the wrap off after 7 days. We have been using Kerlix/Coban with PolyMem silver and antibiotic ointment. He has no issues or complaints today. There has been improvement in wound healing. 9/12; patient presents for follow-up. He again missed his last clinic appointment. He took the wrap off after 7 days. He reports no drainage over the past week. Prior to this we have been using PolyMem silver under Kerlix/Coban. READMISSION 03/28/2023 The patient's right ankle wound has stayed closed, but he reports that shortly after his visit in September when the wound closed, he began having problems on his right foot. At this point, he has what appears to be a massive fungal infection on the dorsal surface of his right foot, extending between the first and second toes. It is exquisitely painful and has a remarkably strong foul odor. There is thick slough on the surface, underneath which the tissue is quite raw. He says that his primary care provider put him on oral antibiotics for a week, but he does not recall what antibiotic it was. He has completed this course and is currently not taking anything for the wound. He says he has been applying Neosporin. Electronic Signature(s) Signed: 03/28/2023 1:22:57 PM By: Fredirick Maudlin MD FACS Entered By: Fredirick Maudlin on 03/28/2023 13:22:57 -------------------------------------------------------------------------------- Physical Exam Details Patient Name: Date of Service: Sean Mccoy 03/28/2023 12:30 PM Medical Record Number: [id number] Patient Account Number: [id number] Date of Birth/Sex: Treating RN: Dec 30, 1960 (62 y.o. M) Primary Care Provider: Volney Presser Other Clinician: Referring Provider: Treating Provider/Extender: Sumner Boast in Treatment: 0 Constitutional Hypertensive, asymptomatic. Tachycardic, asymptomatic. . . No acute distress. Respiratory Normal work of breathing on room air. Notes 03/28/2023: He has what appears to be a massive fungal infection on the dorsal surface of his right foot, extending between the first and second toes. It is exquisitely painful and has a remarkably strong foul odor. There is thick slough on the surface, underneath which the tissue is quite raw. Electronic Signature(s) Signed: 03/28/2023 1:23:40 PM By: Fredirick Maudlin MD FACS Entered By: Fredirick Maudlin on 03/28/2023 13:23:40 -------------------------------------------------------------------------------- Physician Orders Details Patient Name: Date of Service: Sean Mccoy, Sean Mccoy 03/28/2023 12:30 PM [place], [place] ([id number] Page 3 of 10 Medical Record Number: [id number] Patient Account Number: [id number] Date of Birth/Sex: Treating RN: 1961/05/29 (62 y.o. Sean Mccoy Primary Care Provider: Volney Presser Other Clinician: Referring Provider: Treating Provider/Extender: Sumner Boast in Treatment: 0 Verbal / Phone Orders: No Diagnosis Coding ICD-10 Coding Code Description [id number] Non-pressure chronic ulcer of right ankle with fat layer exposed I89.0 Lymphedema, not elsewhere classified I10 Essential (primary) hypertension Follow-up Appointments ppointment in 1 week. - Dr. Celine Ahr - room 2 Return A Anesthetic (In clinic) Topical Lidocaine 4% applied to wound bed Bathing/ Shower/ Hygiene Other Bathing/Shower/Hygiene Orders/Instructions: - soak gauze with Vashe and place it on  the wound and let it sit on there for 2-3 minutes. Lightly wipe the wound the Vashe soaked gauze to clean. Edema Control - Lymphedema / SCD / Other Elevate legs to the level of the heart or above for 30 minutes daily and/or when sitting for 3-4 times a day throughout the day. Avoid standing for long periods of time. Patient to wear own compression stockings every day. - Apply compression stockings first thing in the morning and remove at bedtime. Home Health Admit to [facility] for skilled  nursing wound care. May utilize formulary equivalent dressing for wound treatment orders unless otherwise specified. New wound care orders this week; continue Home Health for wound care. May utilize formulary equivalent dressing for wound treatment orders unless otherwise specified. - mix gentamicin and ketoconazole cream together and apply to wound bed Dressing changes to be completed by [facility] on Monday / Wednesday / Friday except when patient has scheduled visit at [facility]. Other Home Health Orders/Instructions: - PACE Wound Treatment Wound #3 - Foot Wound Laterality: Dorsal, Right Cleanser: Soap and Water 1 x Per Day/30 Days Discharge Instructions: May shower and wash wound with dial antibacterial soap and water prior to dressing change. Cleanser: Wound Cleanser 1 x Per Day/30 Days Discharge Instructions: Cleanse the wound with wound cleanser prior to applying a clean dressing using gauze sponges, not tissue or cotton balls. Topical: Gentamicin 1 x Per Day/30 Days Discharge Instructions: As directed by physician Topical: Ketoconazole Cream 2% 1 x Per Day/30 Days Discharge Instructions: Apply Ketoconazole as directed Secondary Dressing: T Non-Adherent Dressing, 3x4 in 1 x Per Day/30 Days elfa Discharge Instructions: Apply over primary dressing as directed. Secondary Dressing: Woven Gauze Sponge, Non-Sterile 4x4 in 1 x Per Day/30 Days Discharge Instructions: Apply over primary dressing  as directed. Secured With: [employer], 4.5x3.1 (in/yd) 1 x Per Day/30 Days Discharge Instructions: Secure with Kerlix as directed. Secured With: 59M Medipore H Soft Cloth Surgical T ape, 4 x 10 (in/yd) 1 x Per Day/30 Days Discharge Instructions: Secure with tape as directed. Wound #4 - T Fifth oe Wound Laterality: Right Cleanser: Soap and Water 1 x Per Day/30 Days Discharge Instructions: May shower and wash wound with dial antibacterial soap and water prior to dressing change. Cleanser: Wound Cleanser 1 x Per Day/30 Days Discharge Instructions: Cleanse the wound with wound cleanser prior to applying a clean dressing using gauze sponges, not tissue or cotton balls. Topical: Gentamicin 1 x Per Day/30 Days Discharge Instructions: As directed by physician Topical: Ketoconazole Cream 2% 1 x Per Day/30 Days Discharge Instructions: Apply Ketoconazole as directed [place], [place] ([id number]) [id number] Page 4 of 10 Secondary Dressing: T Non-Adherent Dressing, 3x4 in 1 x Per Day/30 Days elfa Discharge Instructions: Apply over primary dressing as directed. Secondary Dressing: Woven Gauze Sponge, Non-Sterile 4x4 in 1 x Per Day/30 Days Discharge Instructions: Apply over primary dressing as directed. Secured With: [employer], 4.5x3.1 (in/yd) 1 x Per Day/30 Days Discharge Instructions: Secure with Kerlix as directed. Secured With: 59M Medipore H Soft Cloth Surgical T ape, 4 x 10 (in/yd) 1 x Per Day/30 Days Discharge Instructions: Secure with tape as directed. Laboratory naerobe culture (MICRO) - PCR of nonhealing wound to right foot Bacteria identified in Unspecified specimen by A LOINC Code: [id number] Convenience Name: Anaerobic culture Patient Medications llergies: No Known Allergies A Notifications Medication Indication Start End 03/28/2023 lidocaine DOSE topical 4 % cream - cream topical 03/28/2023 ketoconazole DOSE topical 2 % cream - Apply to foot  wound with daily dressing changes 03/28/2023 gentamicin DOSE topical 0.1 % cream - Apply to foot wound with daily dressing changes Electronic Signature(s) Signed: 03/28/2023 1:43:28 PM By: Fredirick Maudlin MD FACS Signed: 03/28/2023 4:49:56 PM By: Adline Peals Previous Signature: 03/28/2023 1:25:27 PM Version By: Fredirick Maudlin MD FACS Entered By: Adline Peals on 03/28/2023 13:32:22 Prescription 03/28/2023 -------------------------------------------------------------------------------- Sean Overall MD Patient Name: Provider: 27-Feb-1961 [id number] Date of Birth: NPI#Jerilynn Mages [id number] Sex: DEA #: [phone] [id number] Phone #: License #: [place] Patient Address: [address], [place] [address] [place], [place] [address] [phone] Allergies No Known Allergies Medication Medication: Route: Strength: Form: ketoconazole 2 % topical cream topical 2% cream Class: TOPICAL ANTIFUNGALS Dose: Frequency / Time: Indication: Apply to foot wound with daily dressing changes Number of Refills: Number of Units: 2 Thirty (30) Generic Substitution: Start Date: End Date: One Time Use: Substitution Permitted [id number] No Note to Pharmacy: Hand Signature: Date(s): Prescription 03/28/2023 Sean Mccoy, Sean Mccoy ([id number]) [id number] Page 5 of 10 Mccoy, Sean Fredirick Maudlin MD Patient Name: Provider: 29-Oct-1961 [id number] Date of Birth: NPI#: Jerilynn Mages [id number] Sex: DEA #: [phone] [id number] Phone #: License #: [place] Patient Address: [place], [place] [address] [place], [place] [address] [phone] Allergies No Known Allergies Medication Medication: Route: Strength: Form: gentamicin 0.1 % topical cream topical 0.1 % cream Class: TOPICAL  ANTIBIOTICS Dose: Frequency / Time: Indication: Apply to foot wound with daily dressing changes Number of Refills: Number of Units: 2 Thirty (30) Generic Substitution: Start Date: End Date: One Time Use: Substitution Permitted [id number] No Note to Pharmacy: Hand Signature: Date(s): Electronic Signature(s) Signed: 03/28/2023 1:43:28 PM By: Fredirick Maudlin MD FACS Signed: 03/28/2023 4:49:56 PM By: Adline Peals Previous Signature: 03/28/2023 1:27:20 PM Version By: Fredirick Maudlin MD FACS Entered By: Adline Peals on 03/28/2023 13:32:22 -------------------------------------------------------------------------------- Problem List Details Patient Name: Date of Service: Sean Mccoy 03/28/2023 12:30 PM Medical Record Number: [id number] Patient Account Number: [id number] Date of Birth/Sex: Treating RN: May 15, 1961 (62 y.o. M) Primary Care Provider: Volney Presser Other Clinician: Referring Provider: Treating Provider/Extender: Sumner Boast in Treatment: 0 Active Problems ICD-10 Encounter Code Description Active Date MDM Diagnosis L97.512 Non-pressure chronic ulcer of other part of right foot with fat layer exposed 03/28/2023 No Yes I89.0 Lymphedema, not elsewhere classified 03/28/2023 No Yes I10 Essential (primary) hypertension 03/28/2023 No Yes Inactive Problems Resolved Problems Electronic Signature(s) Signed: 03/28/2023 1:20:11 PM By: Fredirick Maudlin MD FACS Previous Signature: 03/28/2023 12:25:49 PM Version By: Fredirick Maudlin MD FACS Entered By: Fredirick Maudlin on 03/28/2023 13:20:11 [place], [place] ([id number] Page 6 of 10 -------------------------------------------------------------------------------- Progress Note Details Patient Name: Date of Service: Sean Mccoy 03/28/2023 12:30 PM Medical Record Number: [id number] Patient Account Number: [id number] Date of Birth/Sex: Treating RN: Mar 16, 1961 (62 y.o.  M) Primary Care Provider: Volney Presser Other Clinician: Referring Provider: Treating Provider/Extender: Sumner Boast in Treatment: 0 Subjective Chief Complaint Information obtained from Patient 07/12/2022; right ankle wound, Right second medial toe wound 03/28/2023: right foot wound History of Present Illness (HPI) Admission 07/12/2022 Mr. 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 This is a wound that waxes and wanes in healing. It started 10 months ago. He follows with Dr. Posey Pronto, podiatry for this issue. He has been using Betadine wet-to-dry dressings. He has been on several courses of antibiotics for this issue including doxycycline and Keflex. He states that the wound has been stable for the past several months. He has lymphedema and owns compression stockings but is not currently using them. He denies signs of infection. He also developed a second right toe wound  several months ago. He has been using Betadine to this area as well. 8/11; patient presents for follow-up. Unfortunately he missed his last clinic appointments almost been 1 month since have last seen him. 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 Kerlix/Coban was used with PolyMem silver and antibiotic ointment. He states he took this off after 7 days. He is currently been keeping the area covered. He denies signs of infection. We have also been treating a second right toe wound. This is healed. 8/28; patient presents for follow-up. Again he missed his last clinic appointment. He took the wrap off after 7 days. We have been using Kerlix/Coban with PolyMem silver and antibiotic ointment. He has no issues or complaints today. There has been improvement in wound  healing. 9/12; patient presents for follow-up. He again missed his last clinic appointment. He took the wrap off after 7 days. He reports no drainage over the past week. Prior to this we have been using PolyMem silver under Kerlix/Coban. READMISSION 03/28/2023 The patient's right ankle wound has stayed closed, but he reports that shortly after his visit in September when the wound closed, he began having problems on his right foot. At this point, he has what appears to be a massive fungal infection on the dorsal surface of his right foot, extending between the first and second toes. It is exquisitely painful and has a remarkably strong foul odor. There is thick slough on the surface, underneath which the tissue is quite raw. He says that his primary care provider put him on oral antibiotics for a week, but he does not recall what antibiotic it was. He has completed this course and is currently not taking anything for the wound. He says he has been applying Neosporin. Patient History Information obtained from Patient, Chart. Allergies No Known Allergies Family History Diabetes - Mother, Stroke - Father, No family history of Cancer, Heart Disease, Hereditary Spherocytosis, Hypertension, Kidney Disease, Lung Disease, Seizures, Thyroid Problems, Tuberculosis. Social History Never smoker, Marital Status - Single, Alcohol Use - Rarely, Drug Use - No History, Caffeine Use - Rarely. Medical History Cardiovascular Patient has history of Hypertension Musculoskeletal Patient has history of Rheumatoid Arthritis Medical A Surgical History Notes nd Genitourinary Enlarged Prostate Psychiatric Depression, Anxiety, PTSD Review of Systems (ROS) Constitutional Symptoms (General Health) Denies complaints or symptoms of Fatigue, Fever, Chills, Marked Weight Change. Eyes Complains or has symptoms of Glasses / Contacts. Ear/Nose/Mouth/Throat Denies complaints or symptoms of Chronic sinus problems or  rhinitis. Respiratory Denies complaints or symptoms of Chronic or frequent coughs, Shortness of Breath. Gastrointestinal Denies complaints or symptoms of Frequent diarrhea, Nausea, Vomiting. Endocrine Denies complaints or symptoms of Heat/cold intolerance. [place] (Skin) SUMINSKI, Sean Mccoy ([id number]) [id number] Page 7 of 10 Complains or has symptoms of Wounds. Neurologic Denies complaints or symptoms of Numbness/parasthesias. Objective Constitutional Hypertensive, asymptomatic. Tachycardic, asymptomatic. No acute distress. Vitals Time Taken: 12:31 PM, Height: 70 in, Source: Stated, Weight: 205 lbs, Source: Stated, BMI: 29.4, Temperature: 98.7 F, Pulse: 114 bpm, Respiratory Rate: 16 breaths/min, Blood Pressure: 197/89 mmHg. General Notes: pt stated he did not take his BP medication Respiratory Normal work of breathing on room air. General Notes: 03/28/2023: He has what appears to be a massive fungal infection on the dorsal surface of his right foot, extending between the first and second toes. It is exquisitely painful and has a remarkably strong foul odor. There is thick slough on the surface, underneath which the tissue is quite raw. Integumentary (Hair, Skin) Wound #3 status  is Open. Original cause of wound was Gradually Appeared. The date acquired was: 09/26/2022. The wound is located on the Right,Dorsal Foot. The wound measures 6cm length x 9.3cm width x 0.1cm depth; 43.825cm^2 area and 4.383cm^3 volume. There is Fat Layer (Subcutaneous Tissue) exposed. There is no tunneling or undermining noted. There is a medium amount of serosanguineous drainage noted. Foul odor after cleansing was noted. The wound margin is distinct with the outline attached to the wound base. There is small (1-33%) red, pink granulation within the wound bed. There is a large (67-100%) amount of necrotic tissue within the wound bed including Adherent Slough. The periwound skin  appearance had no abnormalities noted for texture. The periwound skin appearance had no abnormalities noted for moisture. The periwound skin appearance had no abnormalities noted for color. Periwound temperature was noted as No Abnormality. The periwound has tenderness on palpation. Wound #4 status is Open. Original cause of wound was Skin T ear/Laceration. The date acquired was: 03/28/2023. The wound is located on the Right T Fifth. The oe wound measures 0.5cm length x 0.6cm width x 0.1cm depth; 0.236cm^2 area and 0.024cm^3 volume. There is Fat Layer (Subcutaneous Tissue) exposed. There is no tunneling or undermining noted. There is a medium amount of serosanguineous drainage noted. The wound margin is distinct with the outline attached to the wound base. There is large (67-100%) red granulation within the wound bed. There is no necrotic tissue within the wound bed. The periwound skin appearance had no abnormalities noted for texture. The periwound skin appearance had no abnormalities noted for moisture. The periwound skin appearance had no abnormalities noted for color. Periwound temperature was noted as No Abnormality. The periwound has tenderness on palpation. Assessment Active Problems ICD-10 Non-pressure chronic ulcer of other part of right foot with fat layer exposed Lymphedema, not elsewhere classified Essential (primary) hypertension Procedures Wound #3 Pre-procedure diagnosis of Wound #3 is a T be determined located on the Right,Dorsal Foot . There was a Selective/Open Wound Non-Viable Tissue o Debridement with a total area of 16 sq cm performed by Fredirick Maudlin, MD. With the following instrument(s): Curette to remove Non-Viable tissue/material. Material removed includes [facility] after achieving pain control using Lidocaine 4% Topical Solution. No specimens were taken. A time out was conducted at 13:01, prior to the start of the procedure. A Minimum amount of bleeding was controlled  with Pressure. The procedure was tolerated well. Post Debridement Measurements: 6cm length x 9.3cm width x 0.1cm depth; 4.383cm^3 volume. Character of Wound/Ulcer Post Debridement is improved. Post procedure Diagnosis Wound #3: Same as Pre-Procedure General Notes: scribed for Dr. Celine Ahr by Adline Peals, RN. Plan Follow-up Appointments: Return Appointment in 1 week. - Dr. Celine Ahr - room 2 Anesthetic: (In clinic) Topical Lidocaine 4% applied to wound bed Bathing/ Shower/ Hygiene: Other Bathing/Shower/Hygiene Orders/Instructions: - soak gauze with Vashe and place it on the wound and let it sit on there for 2-3 minutes. Lightly wipe the wound the Vashe soaked gauze to clean. Edema Control - Lymphedema / SCD / OtherPoul Mccoy, Sean ([id number]) [id number] Page 8 of 10 Elevate legs to the level of the heart or above for 30 minutes daily and/or when sitting for 3-4 times a day throughout the day. Avoid standing for long periods of time. Patient to wear own compression stockings every day. - Apply compression stockings first thing in the morning and remove at bedtime. Home Health: Admit to Home Health for skilled nursing wound care. May utilize formulary equivalent dressing for wound treatment orders  unless otherwise specified. New wound care orders this week; continue Home Health for wound care. May utilize formulary equivalent dressing for wound treatment orders unless otherwise specified. - mix gentamicin and ketoconazole cream together and apply to wound bed Dressing changes to be completed by [place] on Monday / Wednesday / Friday except when patient has scheduled visit at [facility]. Other Home Health Orders/Instructions: - [facility] Laboratory ordered were: Anaerobic culture - PCR of nonhealing wound to right foot The following medication(s) was prescribed: lidocaine topical 4 % cream cream topical was prescribed at facility ketoconazole topical 2 %  cream Apply to foot wound with daily dressing changes starting 03/28/2023 gentamicin topical 0.1 % cream Apply to foot wound with daily dressing changes starting 03/28/2023 WOUND #3: - Foot Wound Laterality: Dorsal, Right Cleanser: Soap and Water 1 x Per Day/30 Days Discharge Instructions: May shower and wash wound with dial antibacterial soap and water prior to dressing change. Cleanser: Wound Cleanser 1 x Per Day/30 Days Discharge Instructions: Cleanse the wound with wound cleanser prior to applying a clean dressing using gauze sponges, not tissue or cotton balls. Topical: Gentamicin 1 x Per Day/30 Days Discharge Instructions: As directed by physician Topical: Ketoconazole Cream 2% 1 x Per Day/30 Days Discharge Instructions: Apply Ketoconazole as directed Secondary Dressing: T Non-Adherent Dressing, 3x4 in 1 x Per Day/30 Days elfa Discharge Instructions: Apply over primary dressing as directed. Secondary Dressing: Woven Gauze Sponge, Non-Sterile 4x4 in 1 x Per Day/30 Days Discharge Instructions: Apply over primary dressing as directed. Secured With: [employer], 4.5x3.1 (in/yd) 1 x Per Day/30 Days Discharge Instructions: Secure with Kerlix as directed. Secured With: 16M Medipore H Soft Cloth Surgical T ape, 4 x 10 (in/yd) 1 x Per Day/30 Days Discharge Instructions: Secure with tape as directed. WOUND #4: - T Fifth Wound Laterality: Right oe Cleanser: Soap and Water 1 x Per Day/30 Days Discharge Instructions: May shower and wash wound with dial antibacterial soap and water prior to dressing change. Cleanser: Wound Cleanser 1 x Per Day/30 Days Discharge Instructions: Cleanse the wound with wound cleanser prior to applying a clean dressing using gauze sponges, not tissue or cotton balls. Topical: Gentamicin 1 x Per Day/30 Days Discharge Instructions: As directed by physician Topical: Ketoconazole Cream 2% 1 x Per Day/30 Days Discharge Instructions: Apply Ketoconazole as  directed Secondary Dressing: T Non-Adherent Dressing, 3x4 in 1 x Per Day/30 Days elfa Discharge Instructions: Apply over primary dressing as directed. Secondary Dressing: Woven Gauze Sponge, Non-Sterile 4x4 in 1 x Per Day/30 Days Discharge Instructions: Apply over primary dressing as directed. Secured With: [employer], 4.5x3.1 (in/yd) 1 x Per Day/30 Days Discharge Instructions: Secure with Kerlix as directed. Secured With: 16M Medipore H Soft Cloth Surgical T ape, 4 x 10 (in/yd) 1 x Per Day/30 Days Discharge Instructions: Secure with tape as directed. 03/28/2023: He has what appears to be a massive fungal infection on the dorsal surface of his right foot, extending between the first and second toes. It is exquisitely painful and has a remarkably strong foul odor. There is thick slough on the surface, underneath which the tissue is quite raw. I used a curette to debride slough from the wound to the extent that the patient would permit it. Debridement was limited by patient pain. Given the strong odor, I took a culture from the wound. For now, we will have him clean the wound with Vashe and allow it to sit on the wound for a few minutes before  he does his dressing change. I am going to have him apply a mixture of ketoconazole and topical gentamicin cream to the wound, including between his toes. Cover with T and gauze. Once his culture data return, I will make further decisions regarding his treatment. He will follow-up in 1 week. elfa Electronic Signature(s) Signed: 03/28/2023 1:43:28 PM By: Fredirick Maudlin MD FACS Signed: 03/28/2023 4:49:56 PM By: Adline Peals Previous Signature: 03/28/2023 1:26:44 PM Version By: Fredirick Maudlin MD FACS Entered By: Adline Peals on 03/28/2023 13:32:46 -------------------------------------------------------------------------------- HxROS Details Patient Name: Date of Service: Sean Mccoy, Sean Mccoy 03/28/2023 12:30 PM Medical Record Number:  [id number] Patient Account Number: [id number] Date of Birth/Sex: Treating RN: 13-Dec-1961 (62 y.o. Sean Mccoy Primary Care Provider: Volney Presser Other Clinician: Referring Provider: Treating Provider/Extender: Sumner Boast in Treatment: 0 Information Obtained From Patient Chart Constitutional Symptoms ([place]) [place], [place] ([id number]) [id number] Page 9 of 10 Complaints and Symptoms: Negative for: Fatigue; Fever; Chills; Marked Weight Change Eyes Complaints and Symptoms: Positive for: Glasses / Contacts Ear/Nose/Mouth/Throat Complaints and Symptoms: Negative for: Chronic sinus problems or rhinitis Respiratory Complaints and Symptoms: Negative for: Chronic or frequent coughs; Shortness of Breath Gastrointestinal Complaints and Symptoms: Negative for: Frequent diarrhea; Nausea; Vomiting Endocrine Complaints and Symptoms: Negative for: Heat/cold intolerance Integumentary (Skin) Complaints and Symptoms: Positive for: Wounds Neurologic Complaints and Symptoms: Negative for: Numbness/parasthesias Hematologic/Lymphatic Cardiovascular Medical History: Positive for: Hypertension Genitourinary Medical History: Past Medical History Notes: Enlarged Prostate Immunological Musculoskeletal Medical History: Positive for: Rheumatoid Arthritis Oncologic Psychiatric Medical History: Past Medical History Notes: Depression, Anxiety, PTSD Immunizations Pneumococcal Vaccine: Received Pneumococcal Vaccination: No Implantable Devices None Family and Social History Cancer: No; Diabetes: Yes - Mother; Heart Disease: No; Hereditary Spherocytosis: No; Hypertension: No; Kidney Disease: No; Lung Disease: No; SeizuresRyan Mccoy, Sean Mccoy ([id number].pdf Page 10 of 10 No; Stroke: Yes - Father; Thyroid Problems: No; Tuberculosis: No; Never smoker; Marital Status - Single; Alcohol Use: Rarely;  Drug Use: No History; Caffeine Use: Rarely; Financial Concerns: No; Food, Clothing or Shelter Needs: No; Support System Lacking: No; Transportation Concerns: No Electronic Signature(s) Signed: 03/28/2023 1:27:39 PM By: Fredirick Maudlin MD FACS Signed: 03/28/2023 4:49:56 PM By: Adline Peals Entered By: Adline Peals on 03/28/2023 12:33:38 -------------------------------------------------------------------------------- SuperBill Details Patient Name: Date of Service: Sean Mccoy 03/28/2023 Medical Record Number: [id number] Patient Account Number: [id number] Date of Birth/Sex: Treating RN: 12-03-1961 (62 y.o. M) Primary Care Provider: Volney Presser Other Clinician: Referring Provider: Treating Provider/Extender: Sumner Boast in Treatment: 0 Diagnosis Coding ICD-10 Codes Code Description [phone] Non-pressure chronic ulcer of other part of right foot with fat layer exposed I89.0 Lymphedema, not elsewhere classified I10 Essential (primary) hypertension Facility Procedures : CPT4 Code: [id number] Description: 99213 - WOUND CARE VISIT-LEV 3 EST PT Modifier: 25 Quantity: 1 : CPT4 Code: [id number] Description: [id number] - DEBRIDE WOUND 1ST 20 SQ CM OR < ICD-10 Diagnosis Description L97.512 Non-pressure chronic ulcer of other part of right foot with fat layer exposed Modifier: Quantity: 1 Physician Procedures : CPT4 Code Description Modifier [id number] - WC PHYS LEVEL 4 - EST PT 25 ICD-10 Diagnosis Description L97.512 Non-pressure chronic ulcer of other part of right foot with fat layer exposed I89.0 Lymphedema, not elsewhere classified I10 Essential  (primary) hypertension Quantity: 1 : [id number] - WC PHYS DEBR WO ANESTH 20 SQ CM ICD-10 Diagnosis Description L97.512 Non-pressure chronic ulcer of other part of right foot with fat layer exposed Quantity: 1 Electronic Signature(s) Signed: 03/28/2023 1:31:09 PM  By: Fredirick Maudlin MD FACS Signed: 03/28/2023  4:49:56 PM By: Adline Peals Previous Signature: 03/28/2023 1:26:59 PM Version By: Fredirick Maudlin MD FACS Entered By: Adline Peals on 03/28/2023 13:29:40

## 2023-03-29 NOTE — Progress Notes (Signed)
[place]ta, [place] ([id number]) [id number] [id number] Page 1 of 4 Visit Report for 03/28/2023 Abuse Risk Screen Details Patient Name: Date of Service: Sean Mccoy 03/28/2023 12:30 PM Medical Record Number: [id number] Patient Account Number: [id number] Date of Birth/Sex: Treating RN: 12/11/1961 (61 y.o. Janyth Contes Primary Care Huey Scalia: Volney Presser Other Clinician: Referring Glynn Yepes: Treating Taneia Mealor/Extender: Sumner Boast in Treatment: 0 Abuse Risk Screen Items Answer ABUSE RISK SCREEN: Has anyone close to you tried to hurt or harm you recentlyo No Do you feel uncomfortable with anyone in your familyo No Has anyone forced you do things that you didnt want to doo No Electronic Signature(s) Signed: 03/28/2023 4:49:56 PM By: Adline Peals Entered By: Adline Peals on 03/28/2023 12:33:45 -------------------------------------------------------------------------------- Activities of Daily Living Details Patient Name: Date of Service: Sean Mccoy 03/28/2023 12:30 PM Medical Record Number: [id number] Patient Account Number: [id number] Date of Birth/Sex: Treating RN: 11-Sep-1961 (62 y.o. Janyth Contes Primary Care Jennifr Gaeta: Volney Presser Other Clinician: Referring Dali Kraner: Treating Laranda Burkemper/Extender: Sumner Boast in Treatment: 0 Activities of Daily Living Items Answer Activities of Daily Living (Please select one for each item) Drive Automobile Completely Able T Medications ake Completely Able Use T elephone Completely Able Care for Appearance Completely Able Use T oilet Completely Able Bath / Shower Completely Able Dress Self Completely Able Feed Self Completely Able Walk Completely Able Get In / Out Bed Completely Able Housework Completely Able Prepare Meals Completely [place] Completely Able Shop for Self Completely Able Electronic Signature(s) Signed: 03/28/2023 4:49:56 PM  By: Adline Peals Entered By: Adline Peals on 03/28/2023 12:34:11 -------------------------------------------------------------------------------- Education Screening Details Patient Name: Date of Service: Sean Mccoy 03/28/2023 12:30 PM Medical Record Number: [id number] Patient Account Number: [id number] Date of Birth/Sex: Treating RN: [birth year] (62 y.o. Janyth Contes Primary Care Langston [place]: Volney Presser Other Clinician: Referring Jonel Weldon: Treating Savvas Roper/Extender: Sumner Boast in Treatment: [place], [place] ([id number]) [id number] Page 2 of 4 Primary Learner Assessed: Patient Learning Preferences/Education Level/Primary Language Learning Preference: Explanation, Demonstration, Video, Printed Material Highest Education Level: High School Preferred Language: Diplomatic Services operational officer Language Barrier: No Translator Needed: No Memory Deficit: No Emotional Barrier: No Cultural/Religious Beliefs Affecting Medical Care: No Physical Barrier Impaired Vision: Yes Glasses Impaired Hearing: No Decreased Hand dexterity: No Knowledge/Comprehension Knowledge Level: Medium Comprehension Level: Medium Ability to understand written instructions: Medium Ability to understand verbal instructions: Medium Motivation Anxiety Level: Calm Cooperation: Cooperative Education Importance: Acknowledges Need Interest in Health Problems: Asks Questions Perception: Coherent Willingness to Engage in Self-Management Medium Activities: Readiness to Engage in Self-Management Medium Activities: Electronic Signature(s) Signed: 03/28/2023 4:49:56 PM By: Adline Peals Entered By: Adline Peals on 03/28/2023 12:34:35 -------------------------------------------------------------------------------- Fall Risk Assessment Details Patient Name: Date of Service: Sean Mccoy, Sean Mccoy 03/28/2023 12:30 PM Medical Record Number:  [id number] Patient Account Number: [id number] Date of Birth/Sex: Treating RN: 12/29/1960 (63 y.o. Janyth Contes Primary Care Letty Salvi: Volney Presser Other Clinician: Referring Ashlen Kiger: Treating Crystall Donaldson/Extender: Sumner Boast in Treatment: 0 Fall Risk Assessment Items Have you had 2 or more falls in the last 12 monthso 0 No Have you had any fall that resulted in injury in the last 12 monthso 0 No FALLS RISK SCREEN History of falling - immediate or within 3 months 0 No Secondary diagnosis (Do you have 2 or more medical diagnoseso) 0 No Ambulatory aid None/bed rest/wheelchair/nurse 0 Yes Crutches/cane/walker 0 No Furniture 0 No Intravenous therapy Access/Saline/Heparin  Lock 0 No Gait/Transferring Normal/ bed rest/ wheelchair 0 Yes Weak (short steps with or without shuffle, stooped but able to lift head while walking, may seek 0 No support from furniture) Impaired (short steps with shuffle, may have difficulty arising from chair, head down, impaired 0 No balance) Mental Status Oriented to own ability 0 Yes Overestimates or forgets limitations 0 No Risk Level: Low Risk Score: 0 [place], Prem ([id number]) [phone] [id number] Page 3 of 4 Electronic Signature(s) -------------------------------------------------------------------------------- Foot Assessment Details Patient Name: Date of Service: Sean Mccoy 03/28/2023 12:30 PM Medical Record Number: [id number] Patient Account Number: [id number] Date of Birth/Sex: Treating RN: [birth year] (62 y.o. Janyth Contes Primary Care Larwence Tu: Volney Presser Other Clinician: Referring Azaiah Mello: Treating [name] Rettinger/Extender: Sumner Boast in Treatment: 0 Foot Assessment Items Site Locations + = Sensation present, - = Sensation absent, Mccoy = Callus, U = Ulcer R = Redness, W = Warmth, M = Maceration, PU = Pre-ulcerative lesion F = Fissure, S = Swelling, D =  Dryness Assessment Right: Left: Other Deformity: No No Prior Foot Ulcer: No No Prior Amputation: No No Charcot Joint: No No Ambulatory Status: Ambulatory Without Help Gait: Unsteady Notes no hx of diabetes or neuropathy Electronic Signature(s) Signed: 03/28/2023 4:49:56 PM By: Adline Peals Entered By: Adline Peals on 03/28/2023 12:35:42 -------------------------------------------------------------------------------- Nutrition Risk Screening Details Patient Name: Date of Service: Sean Mccoy 03/28/2023 12:30 PM Medical Record Number: [id number] Patient Account Number: [id number] Date of Birth/Sex: Treating RN: [birth year] (62 y.o. Janyth Contes Primary Care Lewis Keats: Volney Presser Other Clinician: Referring Tomas Schamp: Treating Aliyanah Rozas/Extender: Sumner Boast in Treatment: 0 Height (in): 70 Weight (lbs): 205 Body Mass Index (BMI): 29.4 Sean Mccoy, Sean Mccoy ([id number]) [phone] [id number].pdf Page 4 of 4 Nutrition Risk Screening Items Score Screening NUTRITION RISK SCREEN: I have an illness or condition that made me change the kind and/or amount of food I eat 0 No I eat fewer than two meals per day 0 No I eat few fruits and vegetables, or milk products 0 No I have three or more drinks of beer, liquor or wine almost every day 0 No I have tooth or mouth problems that make it hard for me to eat 0 No I don't always have enough money to buy the food I need 0 No I eat alone most of the time 0 No I take three or more different prescribed or over-the-counter drugs a day 1 Yes Without wanting to, I have lost or gained 10 pounds in the last six months 0 No I am not always physically able to shop, cook and/or feed myself 0 No Nutrition Protocols Good Risk Protocol 0 No interventions needed Moderate Risk Protocol High Risk Proctocol Risk Level: Good Risk Score: 1 Electronic Signature(s) Signed: 03/28/2023 4:49:56 PM By: Adline Peals Entered By: Adline Peals on 03/28/2023 12:35:09

## 2023-04-07 ENCOUNTER — Encounter (HOSPITAL_BASED_OUTPATIENT_CLINIC_OR_DEPARTMENT_OTHER): Payer: 59 | Admitting: General Surgery

## 2023-04-07 DIAGNOSIS — L97312 Non-pressure chronic ulcer of right ankle with fat layer exposed: Secondary | ICD-10-CM | POA: Diagnosis not present

## 2023-04-07 DIAGNOSIS — I872 Venous insufficiency (chronic) (peripheral): Secondary | ICD-10-CM | POA: Diagnosis not present

## 2023-04-07 DIAGNOSIS — I890 Lymphedema, not elsewhere classified: Secondary | ICD-10-CM | POA: Diagnosis not present

## 2023-04-07 DIAGNOSIS — Z96641 Presence of right artificial hip joint: Secondary | ICD-10-CM | POA: Diagnosis not present

## 2023-04-07 DIAGNOSIS — S91301A Unspecified open wound, right foot, initial encounter: Secondary | ICD-10-CM | POA: Diagnosis not present

## 2023-04-07 DIAGNOSIS — I10 Essential (primary) hypertension: Secondary | ICD-10-CM | POA: Diagnosis not present

## 2023-04-07 NOTE — Progress Notes (Signed)
Avonmore, Hasten ([id number]) [id number] Page 1 of 8 Visit Report for 04/07/2023 Chief Complaint Document Details Patient Name: Date of Service: Sean Mccoy 04/07/2023 2:45 PM Medical Record Number: [id number] Patient Account Number: [id number] Date of Birth/Sex: Treating RN: 1961-05-01 (62 y.o. M) Primary Care Provider: Cari Caraway Other Clinician: Referring Provider: Treating Provider/Extender: Richarda Osmond in Treatment: 1 Information Obtained from: Patient Chief Complaint 07/12/2022; right ankle wound, Right second medial toe wound 03/28/2023: right foot wound Electronic Signature(s) Signed: 04/07/2023 3:12:34 PM By: Duanne Guess MD FACS Entered By: Duanne Guess on 04/07/2023 15:12:34 -------------------------------------------------------------------------------- Debridement Details Patient Name: Date of Service: Sean Mccoy 04/07/2023 2:45 PM Medical Record Number: [id number] Patient Account Number: [id number] Date of Birth/Sex: Treating RN: 1961-01-26 (62 y.o. Marlan Palau Primary Care Provider: Cari Caraway Other Clinician: Referring Provider: Treating Provider/Extender: Richarda Osmond in Treatment: 1 Debridement Performed for Assessment: Wound #3 Right,Dorsal Foot Performed By: Physician Duanne Guess, MD Debridement Type: Debridement Level of Consciousness (Pre-procedure): Awake and Alert Pre-procedure Verification/Time Out Yes - 14:56 Taken: Start Time: 14:56 Pain Control: Lidocaine 4% T opical Solution T Area Debrided (L x W): otal 4 (cm) x 4 (cm) = 16 (cm) Tissue and other material debrided: Non-Viable, Slough, Slough Level: Non-Viable Tissue Debridement Description: Selective/Open Wound Instrument: Curette Bleeding: Minimum Hemostasis Achieved: Pressure Response to Treatment: Procedure was tolerated well Level of Consciousness (Post- Awake and Alert procedure): Post  Debridement Measurements of Total Wound Length: (cm) 6 Width: (cm) 9 Depth: (cm) 0.1 Volume: (cm) 4.241 Character of Wound/Ulcer Post Debridement: Improved Post Procedure Diagnosis Same as Pre-procedure Notes scribed for Dr. Lady Gary by Samuella Bruin, RN Electronic Signature(s) Signed: 04/07/2023 3:21:47 PM By: Duanne Guess MD FACS Signed: 04/07/2023 3:58:28 PM By: Samuella Bruin Entered By: Samuella Bruin on 04/07/2023 14:59:44 Efferson, Caden ([id number]) [id number] Page 2 of 8 -------------------------------------------------------------------------------- HPI Details Patient Name: Date of Service: Sean Mccoy 04/07/2023 2:45 PM Medical Record Number: [id number] Patient Account Number: [id number] Date of Birth/Sex: Treating RN: [birth year] (62 y.o. 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 This is a wound that waxes and wanes in healing. It started 10 months ago. He follows with Dr. Allena Katz, podiatry for this issue. He has been using Betadine wet-to-dry dressings. He has been on several courses of antibiotics for this issue including doxycycline and Keflex. He states that the wound has been stable for the past several months. He has lymphedema and owns compression stockings but is not currently using them. He denies signs of infection. He also developed a second right toe wound several months ago. He has been using Betadine to this area as well. 8/11; patient presents for  follow-up. Unfortunately he missed his last clinic appointments almost been 1 month since have last seen him. 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 Kerlix/Coban was used with PolyMem silver and antibiotic ointment. He states he took this off after 7 days. He is currently been keeping the area covered. He denies signs of infection. We have also been treating a second right toe wound. This  is healed. 8/28; patient presents for follow-up. Again he missed his last clinic appointment. He took the wrap off after 7 days. We have been using Kerlix/Coban with PolyMem silver and antibiotic ointment. He has no issues or complaints today. There has been improvement in wound healing. 9/12; patient presents for follow-up. He again missed his last clinic appointment. He took the wrap off after 7 days. He reports no drainage over the past week. Prior to this we have been using PolyMem silver under Kerlix/Coban. READMISSION 03/28/2023 The patient's right ankle wound has stayed closed, but he reports that shortly after his visit in September when the wound closed, he began having problems on his right foot. At this point, he has what appears to be a massive fungal infection on the dorsal surface of his right foot, extending between the first and second toes. It is exquisitely painful and has a remarkably strong foul odor. There is thick slough on the surface, underneath which the tissue is quite raw. He says that his primary care provider put him on oral antibiotics for a week, but he does not recall what antibiotic it was. He has completed this course and is currently not taking anything for the wound. He says he has been applying Neosporin. 04/07/2023: The culture that I took last week remarkably did not show any fungal elements. It was polymicrobial and included high levels of Bacteroides along with skin flora and Enterococcus. Augmentin was prescribed and he is currently taking this. The odor has dissipated from  his wound. It is still incredibly painful for him. There is a layer of slough on the surface. Electronic Signature(s) Signed: 04/07/2023 3:15:12 PM By: Duanne Guess MD FACS Entered By: Duanne Guess on 04/07/2023 15:15:12 -------------------------------------------------------------------------------- Physical Exam Details Patient Name: Date of Service: Sean Mccoy 04/07/2023 2:45 PM Medical Record Number: [id number] Patient Account Number: [id number] Date of Birth/Sex: Treating RN: Dec 01, 1961 (62 y.o. M) Primary Care Provider: Cari Caraway Other Clinician: Referring Provider: Treating Provider/Extender: Richarda Osmond in Treatment: 1 Constitutional Hypertensive, asymptomatic. Tachycardic, asymptomatic. . . no acute distress. Respiratory Normal work of breathing on room air. Notes 04/07/2023: The odor has dissipated from his wound. It is still incredibly painful for him. There is a layer of slough on the surface. Electronic Signature(s) Signed: 04/07/2023 3:18:04 PM By: Duanne Guess MD FACS Entered By: Duanne Guess on 04/07/2023 15:18:04 First, Ancel ([id number]) [id number] Page 3 of 8 -------------------------------------------------------------------------------- Physician Orders Details Patient Name: Date of Service: Sean Mccoy 04/07/2023 2:45 PM Medical Record Number: [id number] Patient Account Number: [id number] Date of Birth/Sex: Treating RN: 1961/03/26 (62 y.o. Marlan Palau Primary Care Provider: Cari Caraway Other Clinician: Referring Provider: Treating Provider/Extender: Richarda Osmond in Treatment: 1 Verbal / Phone Orders: No Diagnosis Coding ICD-10 Coding Code Description [phone] Non-pressure chronic ulcer of other part of right foot with fat layer exposed I89.0 Lymphedema, not elsewhere classified I10 Essential (primary) hypertension Follow-up Appointments ppointment  in 1 week. - Dr. Lady Gary - room 2 Return A Anesthetic (In clinic) Topical Lidocaine 4% applied to wound bed Bathing/ Shower/ Hygiene Other Bathing/Shower/Hygiene Orders/Instructions: - soak gauze with Vashe and place it on the wound and let it sit on there for 2-3 minutes. Lightly wipe the wound the Vashe soaked gauze to clean. Edema Control - Lymphedema / SCD / Other Elevate legs to the level of the heart or above for 30 minutes daily and/or when sitting for 3-4 times a day throughout  the day. Avoid standing for long periods of time. Patient to wear own compression stockings every day. - Apply compression stockings first thing in the morning and remove at bedtime. Home Health New wound care orders this week; continue Home Health for wound care. May utilize formulary equivalent dressing for wound treatment orders unless otherwise specified. Dressing changes to be completed by Home Health on Monday / Wednesday / Friday except when patient has scheduled visit at [facility]. Other Home Health Orders/Instructions: - [facility] Wound Treatment Wound #3 - Foot Wound Laterality: Dorsal, Right Cleanser: Soap and Water 1 x Per Day/30 Days Discharge Instructions: May shower and wash wound with dial antibacterial soap and water prior to dressing change. Cleanser: Wound Cleanser 1 x Per Day/30 Days Discharge Instructions: Cleanse the wound with wound cleanser prior to applying a clean dressing using gauze sponges, not tissue or cotton balls. Topical: Gentamicin 1 x Per Day/30 Days Discharge Instructions: As directed by physician Topical: Mupirocin Ointment 1 x Per Day/30 Days Discharge Instructions: Apply Mupirocin (Bactroban) as instructed Secondary Dressing: T Non-Adherent Dressing, 3x4 in 1 x Per Day/30 Days elfa Discharge Instructions: Apply over primary dressing as directed. Secondary Dressing: Woven Gauze Sponge, Non-Sterile 4x4 in 1 x Per Day/30 Days Discharge Instructions: Apply over  primary dressing as directed. Secured With: [employer], 4.5x3.1 (in/yd) 1 x Per Day/30 Days Discharge Instructions: Secure with Kerlix as directed. Secured With: 33M Medipore H Soft Cloth Surgical T ape, 4 x 10 (in/yd) 1 x Per Day/30 Days Discharge Instructions: Secure with tape as directed. Patient Medications llergies: No Known Allergies A Notifications Medication Indication Start End 04/07/2023 lidocaine DOSE topical 4 % cream - cream topical 04/07/2023 mupirocin DOSE topical 2 % ointment - Apply to wound with dressing changes as instructed Naveed, Kolber Valmore ([id number]) [id number] Page 4 of 8 Electronic Signature(s) Signed: 04/07/2023 3:21:47 PM By: Duanne Guess MD FACS Previous Signature: 04/07/2023 3:19:06 PM Version By: Duanne Guess MD FACS Entered By: Duanne Guess on 04/07/2023 15:19:19 Prescription 04/07/2023 -------------------------------------------------------------------------------- Clovis Riley MD Patient Name: Provider: July 24, 1961 [id number] Date of Birth: NPI#: Judie Petit [id number] Sex: DEA #: [phone] Phone #: License #: Eligha Bridegroom [facility] Patient Address: [address] [place], [place] [address] [address] [place], [place] [address] [phone] Allergies No Known Allergies Medication Medication: Route: Strength: Form: mupirocin 2 % topical ointment topical 2% ointment Class: TOPICAL ANTIBIOTICS Dose: Frequency / Time: Indication: Apply to wound with dressing changes as instructed Number of Refills: Number of Units: 3 Thirty (30) Generic Substitution: Start Date: End Date: One Time Use: Substitution Permitted 04/07/2023 No Note to Pharmacy: Hand Signature: Date(s): Electronic Signature(s) Signed: 04/07/2023 3:20:53 PM By: Duanne Guess MD FACS Entered By: Duanne Guess on 04/07/2023  15:20:53 -------------------------------------------------------------------------------- Problem List Details Patient Name: Date of Service: Sean Mccoy 04/07/2023 2:45 PM Medical Record Number: [id number] Patient Account Number: [id number] Date of Birth/Sex: Treating RN: 05-29-1961 (62 y.o. M) Primary Care Provider: Cari Caraway Other Clinician: Referring Provider: Treating Provider/Extender: Richarda Osmond in Treatment: 1 Active Problems ICD-10 Encounter Code Description Active Date MDM Diagnosis L97.512 Non-pressure chronic ulcer of other part of right foot with fat layer exposed 03/28/2023 No Yes I89.0 Lymphedema, not elsewhere classified 03/28/2023 No Yes I10 Essential (primary) hypertension 03/28/2023 No Yes Sassaman, Byan ([id number]) [id number] Page 5 of 8 Inactive Problems Resolved Problems Electronic Signature(s) Signed: 04/07/2023 3:12:15 PM By: Duanne Guess MD FACS Entered  By: Duanne Guess on 04/07/2023 15:12:15 -------------------------------------------------------------------------------- Progress Note Details Patient Name: Date of Service: Sean Mccoy 04/07/2023 2:45 PM Medical Record Number: [id number] Patient Account Number: [id number] Date of Birth/Sex: Treating RN: 07-Apr-1961 (62 y.o. M) Primary Care Provider: Cari Caraway Other Clinician: Referring Provider: Treating Provider/Extender: Richarda Osmond in Treatment: 1 Subjective Chief Complaint Information obtained from Patient 07/12/2022; right ankle wound, Right second medial toe wound 03/28/2023: right foot wound History of Present Illness (HPI) Admission 07/12/2022 Mr. 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 This is a wound that waxes and wanes in healing. It started 10 months ago. He follows with Dr. Allena Katz, podiatry for this issue. He has been using Betadine wet-to-dry dressings. He has been on several courses of antibiotics for this issue including doxycycline and Keflex. He states that the wound has been stable for the past several months. He has lymphedema and owns compression stockings but is not currently using them. He denies signs of infection. He also developed a second right toe wound several months ago. He has been using Betadine to this area as well. 8/11; patient presents for follow-up. Unfortunately he missed his last clinic appointments almost been 1 month since have last seen him. 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 Kerlix/Coban was used with PolyMem silver and antibiotic ointment. He states he took this off after 7 days. He is currently been keeping the area covered. He denies signs of infection. We have also been treating a second right toe wound. This is healed. 8/28; patient presents for follow-up. Again he missed his last clinic appointment. He took the wrap off after 7 days. We have been using Kerlix/Coban with PolyMem silver and antibiotic ointment. He has no issues or complaints today. There has been improvement in wound healing. 9/12; patient presents for follow-up. He again missed his last clinic appointment. He took the wrap off after 7 days. He reports no drainage over the past week. Prior to this we have been using PolyMem silver under Kerlix/Coban. READMISSION 03/28/2023 The patient's right ankle wound has stayed closed, but he reports that shortly after his visit in September when the wound closed, he began having problems on his right foot. At this point, he has what appears to be a massive fungal infection on the dorsal surface of his right foot, extending between the first and second toes. It is exquisitely painful and has a remarkably strong  foul odor. There is thick slough on the surface, underneath which the tissue is quite raw. He says that his primary care provider put him on oral antibiotics for a week, but he does not recall what antibiotic it was. He has completed this course and is currently not taking anything for the wound. He says he has been applying Neosporin. 04/07/2023: The culture that I took last week remarkably did not show any fungal elements. It was polymicrobial and included high levels of Bacteroides along with skin flora and Enterococcus. Augmentin was prescribed and he is currently taking this. The odor has dissipated from his wound. It is still incredibly painful for him. There is a layer of slough on the surface. Patient History Information obtained from Patient, Chart. Family History Diabetes - Mother, Stroke - Father, No family  history of Cancer, Heart Disease, Hereditary Spherocytosis, Hypertension, Kidney Disease, Lung Disease, Seizures, Thyroid Problems, Tuberculosis. Social History Never smoker, Marital Status - Single, Alcohol Use - Rarely, Drug Use - No History, Caffeine Use - Rarely. Medical History Cardiovascular Patient has history of Hypertension Musculoskeletal Patient has history of Rheumatoid Arthritis Medical A Surgical History Notes nd Genitourinary Enlarged Prostate Psychiatric Depression, Anxiety, PTSD Knebel, Theophil ([id number]) [id number] Page 6 of 8 Objective Constitutional Hypertensive, asymptomatic. Tachycardic, asymptomatic. no acute distress. Vitals Time Taken: 2:42 PM, Height: 70 in, Weight: 205 lbs, BMI: 29.4, Temperature: 97.4 F, Pulse: 114 bpm, Respiratory Rate: 16 breaths/min, Blood Pressure: 166/87 mmHg. Respiratory Normal work of breathing on room air. General Notes: 04/07/2023: The odor has dissipated from his wound. It is still incredibly painful for him. There is a layer of slough on the surface. Integumentary (Hair, Skin) Wound #3  status is Open. Original cause of wound was Gradually Appeared. The date acquired was: 09/26/2022. The wound has been in treatment 1 weeks. The wound is located on the Right,Dorsal Foot. The wound measures 6cm length x 9cm width x 0.1cm depth; 42.412cm^2 area and 4.241cm^3 volume. There is Fat Layer (Subcutaneous Tissue) exposed. There is no tunneling or undermining noted. There is a medium amount of serosanguineous drainage noted. The wound margin is distinct with the outline attached to the wound base. There is large (67-100%) red, pink granulation within the wound bed. There is a small (1-33%) amount of necrotic tissue within the wound bed including Adherent Slough. The periwound skin appearance had no abnormalities noted for texture. The periwound skin appearance had no abnormalities noted for moisture. The periwound skin appearance had no abnormalities noted for color. Periwound temperature was noted as No Abnormality. The periwound has tenderness on palpation. Wound #4 status is Open. Original cause of wound was Skin T ear/Laceration. The date acquired was: 03/28/2023. The wound has been in treatment 1 weeks. The wound is located on the Right T Fifth. The wound measures 0cm length x 0cm width x 0cm depth; 0cm^2 area and 0cm^3 volume. There is no tunneling or oe undermining noted. There is a none present amount of drainage noted. The wound margin is distinct with the outline attached to the wound base. There is no granulation within the wound bed. There is no necrotic tissue within the wound bed. The periwound skin appearance had no abnormalities noted for texture. The periwound skin appearance had no abnormalities noted for moisture. The periwound skin appearance had no abnormalities noted for color. Periwound temperature was noted as No Abnormality. Assessment Active Problems ICD-10 Non-pressure chronic ulcer of other part of right foot with fat layer exposed Lymphedema, not elsewhere  classified Essential (primary) hypertension Procedures Wound #3 Pre-procedure diagnosis of Wound #3 is a T be determined located on the Right,Dorsal Foot . There was a Selective/Open Wound Non-Viable Tissue o Debridement with a total area of 16 sq cm performed by Duanne Guess, MD. With the following instrument(s): Curette to remove Non-Viable tissue/material. Material removed includes [facility] after achieving pain control using Lidocaine 4% Topical Solution. No specimens were taken. A time out was conducted at 14:56, prior to the start of the procedure. A Minimum amount of bleeding was controlled with Pressure. The procedure was tolerated well. Post Debridement Measurements: 6cm length x 9cm width x 0.1cm depth; 4.241cm^3 volume. Character of Wound/Ulcer Post Debridement is improved. Post procedure Diagnosis Wound #3: Same as Pre-Procedure General Notes: scribed for Dr. Lady Gary by Samuella Bruin, RN. Plan Follow-up Appointments: Return Appointment  in 1 week. - Dr. Lady Garyannon - room 2 Anesthetic: (In clinic) Topical Lidocaine 4% applied to wound bed Bathing/ Shower/ Hygiene: Other Bathing/Shower/Hygiene Orders/Instructions: - soak gauze with Vashe and place it on the wound and let it sit on there for 2-3 minutes. Lightly wipe the wound the Vashe soaked gauze to clean. Edema Control - Lymphedema / SCD / Other: Elevate legs to the level of the heart or above for 30 minutes daily and/or when sitting for 3-4 times a day throughout the day. Avoid standing for long periods of time. Patient to wear own compression stockings every day. - Apply compression stockings first thing in the morning and remove at bedtime. Home Health: Elliot GaultBECTON, Simone ([id number]) [id number] Page 7 of 8 New wound care orders this week; continue Home Health for wound care. May utilize formulary equivalent dressing for wound treatment orders unless otherwise specified. Dressing changes to be  completed by Home Health on Monday / Wednesday / Friday except when [facility] Other Home Health Orders/Instructions: - [facility]ACE The following medication(s) was prescribed: lidocaine topical 4 % cream cream topical was prescribed at facility mupirocin topical 2 % ointment Apply to wound with dressing changes as instructed starting 04/07/2023 WOUND #3: - Foot Wound Laterality: Dorsal, Right Cleanser: Soap and Water 1 x Per Day/30 Days Discharge Instructions: May shower and wash wound with dial antibacterial soap and water prior to dressing change. Cleanser: Wound Cleanser 1 x Per Day/30 Days Discharge Instructions: Cleanse the wound with wound cleanser prior to applying a clean dressing using gauze sponges, not tissue or cotton balls. Topical: Gentamicin 1 x Per Day/30 Days Discharge Instructions: As directed by physician Topical: Mupirocin Ointment 1 x Per Day/30 Days Discharge Instructions: Apply Mupirocin (Bactroban) as instructed Secondary Dressing: T Non-Adherent Dressing, 3x4 in 1 x Per Day/30 Days elfa Discharge Instructions: Apply over primary dressing as directed. Secondary Dressing: Woven Gauze Sponge, Non-Sterile 4x4 in 1 x Per Day/30 Days Discharge Instructions: Apply over primary dressing as directed. Secured With: [employer] Roll Sterile, 4.5x3.1 (in/yd) 1 x Per Day/30 Days Discharge Instructions: Secure with Kerlix as directed. Secured With: 56M Medipore H Soft Cloth Surgical T ape, 4 x 10 (in/yd) 1 x Per Day/30 Days Discharge Instructions: Secure with tape as directed. 04/07/2023: The odor has dissipated from his wound. It is still incredibly painful for him. There is a layer of slough on the surface. He was able to tolerate debridement today. I used a curette to debride slough from the wound surface. Based on his culture data, I am going to discontinue the ketoconazole topically, continue both topical gentamicin and add topical mupirocin. He should  complete the course of oral Augmentin that was prescribed. Continue to use a nonstick dressing and gauze. Follow-up in 1 week. Electronic Signature(s) Signed: 04/07/2023 3:20:24 PM By: Duanne Guessannon, Azile Minardi MD FACS Entered By: Duanne Guessannon, Marcy Sookdeo on 04/07/2023 15:20:23 -------------------------------------------------------------------------------- HxROS Details Patient Name: Date of Service: Milon ScoreBECTO N, ISA A Mccoy 04/07/2023 2:45 PM Medical Record Number: [id number] Patient Account Number: [id number] Date of Birth/Sex: Treating RN: [birth year] (62 y.o. M) Primary Care Provider: Cari Carawaydem, Donna Other Clinician: Referring Provider: Treating Provider/Extender: Richarda Osmondannon, Ata Pecha Odem, Donna Weeks in Treatment: 1 Information Obtained From Patient Chart Cardiovascular Medical History: Positive for: Hypertension Genitourinary Medical History: Past Medical History Notes: Enlarged Prostate Musculoskeletal Medical History: Positive for: Rheumatoid Arthritis Psychiatric Medical History: Past Medical History Notes: Depression, Anxiety, PTSD Immunizations Pneumococcal Vaccine: Received Pneumococcal Vaccination: No Devins, Jesten ([id number]) [id number] Page 8 of 8  Implantable Devices None Family and Social History Cancer: No; Diabetes: Yes - Mother; Heart Disease: No; Hereditary Spherocytosis: No; Hypertension: No; Kidney Disease: No; Lung Disease: No; Seizures: No; Stroke: Yes - Father; Thyroid Problems: No; Tuberculosis: No; Never smoker; Marital Status - Single; Alcohol Use: Rarely; Drug Use: No History; Caffeine Use: Rarely; Financial Concerns: No; Food, Clothing or Shelter Needs: No; Support System Lacking: No; Transportation Concerns: No Electronic Signature(s) Signed: 04/07/2023 3:21:47 PM By: Duanne Guess MD FACS Entered By: Duanne Guess on 04/07/2023 15:17:14 -------------------------------------------------------------------------------- SuperBill  Details Patient Name: Date of Service: Sean Mccoy 04/07/2023 Medical Record Number: [id number] Patient Account Number: [id number] Date of Birth/Sex: Treating RN: 09-26-1961 (62 y.o. M) Primary Care Provider: Cari Caraway Other Clinician: Referring Provider: Treating Provider/Extender: Richarda Osmond in Treatment: 1 Diagnosis Coding ICD-10 Codes Code Description [phone] Non-pressure chronic ulcer of other part of right foot with fat layer exposed I89.0 Lymphedema, not elsewhere classified I10 Essential (primary) hypertension Facility Procedures : CPT4 Code: [id number] Description: [phone] - DEBRIDE WOUND 1ST 20 SQ CM OR < ICD-10 Diagnosis Description L97.512 Non-pressure chronic ulcer of other part of right foot with fat layer exposed Modifier: Quantity: 1 Physician Procedures : CPT4 Code Description Modifier [id number] - WC PHYS LEVEL 4 - EST PT 25 ICD-10 Diagnosis Description L97.512 Non-pressure chronic ulcer of other part of right foot with fat layer exposed I89.0 Lymphedema, not elsewhere classified I10 Essential  (primary) hypertension Quantity: 1 : [id number] - WC PHYS DEBR WO ANESTH 20 SQ CM ICD-10 Diagnosis Description L97.512 Non-pressure chronic ulcer of other part of right foot with fat layer exposed Quantity: 1 Electronic Signature(s) Signed: 04/07/2023 3:20:48 PM By: Duanne Guess MD FACS Entered By: Duanne Guess on 04/07/2023 15:20:48

## 2023-04-07 NOTE — Progress Notes (Signed)
Sean Mccoy, Sean Mccoy ([id number]) [id number] Page 1 of 8 Visit Report for 04/07/2023 Arrival Information Details Patient Name: Date of Service: Sean Mccoy 04/07/2023 2:45 PM Medical Record Number: [id number] Patient Account Number: [id number] Date of Birth/Sex: Treating RN: 1961-06-30 (62 y.o. Sean Mccoy Primary Care Leonette Tischer: Cari Caraway Other Clinician: Referring Jazzy Parmer: Treating Ernest Orr/Extender: Richarda Osmond in Treatment: 1 Visit Information History Since Last Visit Added or deleted any medications: No Patient Arrived: Ambulatory Any new allergies or adverse reactions: No Arrival Time: 14:38 Had a fall or experienced change in No Accompanied By: self activities of daily living that may affect Transfer Assistance: None risk of falls: Patient Identification Verified: Yes Signs or symptoms of abuse/neglect since last visito No Secondary Verification Process Completed: Yes Hospitalized since last visit: No Implantable device outside of the clinic excluding No cellular tissue based products placed in the center since last visit: Has Dressing in Place as Prescribed: Yes Pain Present Now: Yes Electronic Signature(s) Signed: 04/07/2023 3:58:28 PM By: Samuella Bruin Entered By: Samuella Bruin on 04/07/2023 14:41:06 -------------------------------------------------------------------------------- Encounter Discharge Information Details Patient Name: Date of Service: Sean Mccoy 04/07/2023 2:45 PM Medical Record Number: [id number] Patient Account Number: [id number] Date of Birth/Sex: Treating RN: 1961/12/13 (62 y.o. Sean Mccoy Primary Care Weston Fulco: Cari Caraway Other Clinician: Referring Olevia Westervelt: Treating Jahnaya Branscome/Extender: Richarda Osmond in Treatment: 1 Encounter Discharge Information Items Post Procedure Vitals Discharge Condition: Stable Temperature (F): 97.4 Ambulatory  Status: Ambulatory Pulse (bpm): 114 Discharge Destination: Home Respiratory Rate (breaths/min): 16 Transportation: Private Auto Blood Pressure (mmHg): 166/87 Accompanied By: self Schedule Follow-up Appointment: Yes Clinical Summary of Care: Patient Declined Electronic Signature(s) Signed: 04/07/2023 3:58:28 PM By: Samuella Bruin Entered By: Samuella Bruin on 04/07/2023 15:11:48 -------------------------------------------------------------------------------- Lower Extremity Assessment Details Patient Name: Date of Service: Sean Mccoy 04/07/2023 2:45 PM Medical Record Number: [id number] Patient Account Number: [id number] Date of Birth/Sex: Treating RN: 1961-12-01 (62 y.o. Sean Mccoy Primary Care Merriel Zinger: Cari Caraway Other Clinician: Referring Pershing Skidmore: Treating Sean Mccoy/Extender: Richarda Osmond in Treatment: 1 Edema Assessment Assessed: Sean Mccoy: No] Franne Forts: No] B[LeftJolene Mccoy, Sean Mccoy ([id number])] [Right: [id number] Page 2 of 8] [Left: Edema] [Right: :] Calf Left: Right: Point of Measurement: From Medial Instep 39.5 cm Ankle Left: Right: Point of Measurement: From Medial Instep 27.5 cm Electronic Signature(s) Signed: 04/07/2023 3:58:28 PM By: Samuella Bruin Entered By: Samuella Bruin on 04/07/2023 14:45:27 -------------------------------------------------------------------------------- Multi Wound Chart Details Patient Name: Date of Service: Sean Mccoy 04/07/2023 2:45 PM Medical Record Number: [id number] Patient Account Number: [id number] Date of Birth/Sex: Treating RN: [birth year] (62 y.o. M) Primary Care Bryndle Corredor: Cari Caraway Other Clinician: Referring Seferino Oscar: Treating Shneur [place]/Extender: Richarda Osmond in Treatment: 1 Vital Signs Height(in): 70 Pulse(bpm): 114 Weight(lbs): 205 Blood Pressure(mmHg): 166/87 Body Mass Index(BMI): 29.4 Temperature(F): 97.4 Respiratory  Rate(breaths/min): 16 [3:Photos:] [Mccoy/A:Mccoy/A] Right, Dorsal Foot Right T Fifth oe Mccoy/A Wound Location: Gradually Appeared Skin T ear/Laceration Mccoy/A Wounding Event: T be determined o Abrasion Mccoy/A Primary Etiology: Hypertension, Rheumatoid Arthritis Hypertension, Rheumatoid Arthritis Mccoy/A Comorbid History: 09/26/2022 03/28/2023 Mccoy/A Date Acquired: 1 1 Mccoy/A Weeks of Treatment: Open Open Mccoy/A Wound Status: No No Mccoy/A Wound Recurrence: 6x9x0.1 0x0x0 Mccoy/A Measurements L x W x D (cm) 42.412 0 Mccoy/A A (cm) : rea 4.241 0 Mccoy/A Volume (cm) : 3.20% 100.00% Mccoy/A % Reduction in A rea: 3.20% 100.00% Mccoy/A % Reduction in Volume: Full Thickness Without Exposed Full Thickness Without Exposed Mccoy/A Classification: Support  Structures Support Structures Medium None Present Mccoy/A Exudate A mount: Serosanguineous Mccoy/A Mccoy/A Exudate Type: red, brown Mccoy/A Mccoy/A Exudate Color: Distinct, outline attached Distinct, outline attached Mccoy/A Wound Margin: Large (67-100%) None Present (0%) Mccoy/A Granulation A mount: Red, Pink Mccoy/A Mccoy/A Granulation Quality: Small (1-33%) None Present (0%) Mccoy/A Necrotic A mount: Fat Layer (Subcutaneous Tissue): Yes Fascia: No Mccoy/A Exposed Structures: Fascia: No Fat Layer (Subcutaneous Tissue): No Tendon: No Tendon: No Muscle: No Muscle: No Joint: No Joint: No Bone: No Bone: No Small (1-33%) Large (67-100%) Mccoy/A Epithelialization: Debridement - Selective/Open Wound Mccoy/A Mccoy/A Debridement: Pre-procedure Verification/Time Out 14:56 Mccoy/A Mccoy/A Taken: Elliot Gault ([id number]) [id number] Page 3 of 8 Lidocaine 4% Topical Solution Mccoy/A Mccoy/A Pain Control: Slough Mccoy/A Mccoy/A Tissue Debrided: Non-Viable Tissue Mccoy/A Mccoy/A Level: 16 Mccoy/A Mccoy/A Debridement A (sq cm): rea Curette Mccoy/A Mccoy/A Instrument: Minimum Mccoy/A Mccoy/A Bleeding: Pressure Mccoy/A Mccoy/A Hemostasis A chieved: Procedure was tolerated well Mccoy/A Mccoy/A Debridement Treatment Response: 6x9x0.1 Mccoy/A Mccoy/A Post Debridement  Measurements L x W x D (cm) 4.241 Mccoy/A Mccoy/A Post Debridement Volume: (cm) No Abnormalities Noted No Abnormalities Noted Mccoy/A Periwound Skin Texture: No Abnormalities Noted No Abnormalities Noted Mccoy/A Periwound Skin Moisture: No Abnormalities Noted No Abnormalities Noted Mccoy/A Periwound Skin Color: No Abnormality No Abnormality Mccoy/A Temperature: Yes Mccoy/A Mccoy/A Tenderness on Palpation: Debridement Mccoy/A Mccoy/A Procedures Performed: Treatment Notes Wound #3 (Foot) Wound Laterality: Dorsal, Right Cleanser Soap and Water Discharge Instruction: May shower and wash wound with dial antibacterial soap and water prior to dressing change. Wound Cleanser Discharge Instruction: Cleanse the wound with wound cleanser prior to applying a clean dressing using gauze sponges, not tissue or cotton balls. Peri-Wound Care Topical Gentamicin Discharge Instruction: As directed by physician Mupirocin Ointment Discharge Instruction: Apply Mupirocin (Bactroban) as instructed Primary Dressing Secondary Dressing T Non-Adherent Dressing, 3x4 in elfa Discharge Instruction: Apply over primary dressing as directed. Woven Gauze Sponge, Non-Sterile 4x4 in Discharge Instruction: Apply over primary dressing as directed. Secured With [employer], 4.5x3.1 (in/yd) Discharge Instruction: Secure with Kerlix as directed. 73M Medipore H Soft Cloth Surgical T ape, 4 x 10 (in/yd) Discharge Instruction: Secure with tape as directed. Compression Wrap Compression Stockings Add-Ons Wound #4 (Toe Fifth) Wound Laterality: Right Cleanser Peri-Wound Care Topical Primary Dressing Secondary Dressing Secured With Compression Wrap Compression Stockings Add-Ons Electronic Signature(s) Sean Mccoy, Sean Mccoy ([id number]) [id number] Page 4 of 8 Signed: 04/07/2023 3:12:28 PM By: Duanne Guess MD FACS Entered By: Duanne Guess on 04/07/2023  15:12:28 -------------------------------------------------------------------------------- Multi-Disciplinary Care Plan Details Patient Name: Date of Service: Sean Mccoy 04/07/2023 2:45 PM Medical Record Number: [id number] Patient Account Number: [id number] Date of Birth/Sex: Treating RN: [birth year] (62 y.o. Sean Mccoy Primary Care Issacc Merlo: Cari Caraway Other Clinician: Referring Marise Knapper: Treating Royden Bulman/Extender: Richarda Osmond in Treatment: 1 Active Inactive Necrotic Tissue Nursing Diagnoses: Impaired tissue integrity related to necrotic/devitalized tissue Knowledge deficit related to management of necrotic/devitalized tissue Goals: Necrotic/devitalized tissue will be minimized in the wound bed Date Initiated: 03/28/2023 Target Resolution Date: 06/03/2023 Goal Status: Active Patient/caregiver will verbalize understanding of reason and process for debridement of necrotic tissue Date Initiated: 03/28/2023 Target Resolution Date: 06/03/2023 Goal Status: Active Interventions: Assess patient pain level pre-, during and post procedure and prior to discharge Provide education on necrotic tissue and debridement process Treatment Activities: Apply topical anesthetic as ordered : 03/28/2023 Notes: Wound/Skin Impairment Nursing Diagnoses: Impaired tissue integrity Knowledge deficit related to ulceration/compromised skin integrity Goals: Patient/caregiver will verbalize understanding of skin care regimen Date Initiated:  03/28/2023 Target Resolution Date: 05/07/2023 Goal Status: Active Interventions: Assess ulceration(s) every visit Treatment Activities: Skin care regimen initiated : 03/28/2023 Topical wound management initiated : 03/28/2023 Notes: Electronic Signature(s) Signed: 04/07/2023 3:58:28 PM By: Samuella Bruin Entered By: Samuella Bruin on 04/07/2023  14:59:52 -------------------------------------------------------------------------------- Pain Assessment Details Patient Name: Date of Service: Sean Mccoy 04/07/2023 2:45 PM Medical Record Number: [id number] Patient Account Number: [id number] Date of Birth/Sex: Treating RN: 08/29/1961 (62 y.o. Sean Mccoy Primary Care Mileena Rothenberger: Cari Caraway Other Clinician: Referring Linzey Ramser: Treating Shay Bartoli/Extender: Artist Beach Higden, Forbes ([id number]) [phone].pdf Page 5 of 8 Weeks in Treatment: 1 Active Problems Location of Pain Severity and Description of Pain Patient Has Paino Yes Site Locations Pain Location: Pain in Ulcers Duration of the Pain. Constant / Intermittento Constant Rate the pain. Current Pain Level: 8 Character of Pain Describe the Pain: Burning Pain Management and Medication Current Pain Management: Medication: Yes Electronic Signature(s) Signed: 04/07/2023 3:58:28 PM By: Samuella Bruin Entered By: Samuella Bruin on 04/07/2023 14:41:22 -------------------------------------------------------------------------------- Patient/Caregiver Education Details Patient Name: Date of Service: Sean Mccoy 4/11/2024andnbsp2:45 PM Medical Record Number: [id number] Patient Account Number: [id number] Date of Birth/Gender: Treating RN: 23-Feb-1961 (62 y.o. Sean Mccoy Primary Care Physician: Cari Caraway Other Clinician: Referring Physician: Treating Physician/Extender: Richarda Osmond in Treatment: 1 Education Assessment Education Provided To: Patient Education Topics Provided Wound/Skin Impairment: Methods: Explain/Verbal Responses: Reinforcements needed, State content correctly Electronic Signature(s) Signed: 04/07/2023 3:58:28 PM By: Samuella Bruin Entered By: Samuella Bruin on 04/07/2023  15:00:04 -------------------------------------------------------------------------------- Wound Assessment Details Patient Name: Date of Service: Sean Mccoy 04/07/2023 2:45 PM Medical Record Number: [id number] Patient Account Number: [id number] Date of Birth/Sex: Treating RN: 1961-06-20 (62 y.o. Sean Mccoy, Sean Mccoy ([id number]) [phone].pdf Page 6 of 8 Primary Care Ebony Rickel: Cari Caraway Other Clinician: Referring Len Azeez: Treating Makari Portman/Extender: Richarda Osmond in Treatment: 1 Wound Status Wound Number: 3 Primary Etiology: T be determined o Wound Location: Right, Dorsal Foot Wound Status: Open Wounding Event: Gradually Appeared Comorbid History: Hypertension, Rheumatoid Arthritis Date Acquired: 09/26/2022 Weeks Of Treatment: 1 Clustered Wound: No Photos Wound Measurements Length: (cm) 6 Width: (cm) 9 Depth: (cm) 0.1 Area: (cm) 42.412 Volume: (cm) 4.241 % Reduction in Area: 3.2% % Reduction in Volume: 3.2% Epithelialization: Small (1-33%) Tunneling: No Undermining: No Wound Description Classification: Full Thickness Without Exposed Support Structures Wound Margin: Distinct, outline attached Exudate Amount: Medium Exudate Type: Serosanguineous Exudate Color: red, brown Foul Odor After Cleansing: No Slough/Fibrino Yes Wound Bed Granulation Amount: Large (67-100%) Exposed Structure Granulation Quality: Red, Pink Fascia Exposed: No Necrotic Amount: Small (1-33%) Fat Layer (Subcutaneous Tissue) Exposed: Yes Necrotic Quality: Adherent Slough Tendon Exposed: No Muscle Exposed: No Joint Exposed: No Bone Exposed: No Periwound Skin Texture Texture Color No Abnormalities Noted: Yes No Abnormalities Noted: Yes Moisture Temperature / Pain No Abnormalities Noted: Yes Temperature: No Abnormality Tenderness on Palpation: Yes Treatment Notes Wound #3 (Foot) Wound Laterality: Dorsal,  Right Cleanser Soap and Water Discharge Instruction: May shower and wash wound with dial antibacterial soap and water prior to dressing change. Wound Cleanser Discharge Instruction: Cleanse the wound with wound cleanser prior to applying a clean dressing using gauze sponges, not tissue or cotton balls. Peri-Wound Care Topical Gentamicin Discharge Instruction: As directed by physician Mupirocin Ointment Mccoy, Sean ([id number]) [id number] Page 7 of 8 Discharge Instruction: Apply Mupirocin (Bactroban) as instructed Primary Dressing Secondary Dressing T Non-Adherent Dressing, 3x4 in elfa Discharge Instruction: Apply over primary dressing  as directed. Woven Gauze Sponge, Non-Sterile 4x4 in Discharge Instruction: Apply over primary dressing as directed. Secured With [employer] Roll Sterile, 4.5x3.1 (in/yd) Discharge Instruction: Secure with Kerlix as directed. 67M Medipore H Soft Cloth Surgical T ape, 4 x 10 (in/yd) Discharge Instruction: Secure with tape as directed. Compression Wrap Compression Stockings Add-Ons Electronic Signature(s) Signed: 04/07/2023 3:58:28 PM By: Samuella BruinHerrington, Taylor Entered By: Samuella BruinHerrington, Taylor on 04/07/2023 14:47:58 -------------------------------------------------------------------------------- Wound Assessment Details Patient Name: Date of Service: Sean LevinsBECTO Mccoy, Sean Mccoy 04/07/2023 2:45 PM Medical Record Number: [id number] Patient Account Number: [id number] Date of Birth/Sex: Treating RN: 1961/10/01 (62 y.o. Sean PalauM) Herrington, Taylor Primary Care Orabelle Rylee: Cari Carawaydem, Donna Other Clinician: Referring Jizelle Conkey: Treating Shakil Dirk/Extender: Richarda Osmondannon, Jennifer Odem, Donna Weeks in Treatment: 1 Wound Status Wound Number: 4 Primary Etiology: Abrasion Wound Location: Right T Fifth oe Wound Status: Open Wounding Event: Skin Tear/Laceration Comorbid History: Hypertension, Rheumatoid Arthritis Date Acquired: 03/28/2023 Weeks Of Treatment: 1 Clustered  Wound: No Photos Wound Measurements Length: (cm) Width: (cm) Depth: (cm) Area: (cm) Volume: (cm) 0 % Reduction in Area: 100% 0 % Reduction in Volume: 100% 0 Epithelialization: Large (67-100%) 0 Tunneling: No 0 Undermining: No Wound Description Classification: Full Thickness Without Exposed Support Structures Wound Margin: Distinct, outline attached Exudate Amount: None Present Foul Odor After Cleansing: No Slough/Fibrino No Wound Bed Mccoy, Sean ([id number]) [id number] Page 8 of 8 Granulation Amount: None Present (0%) Exposed Structure Necrotic Amount: None Present (0%) Fascia Exposed: No Fat Layer (Subcutaneous Tissue) Exposed: No Tendon Exposed: No Muscle Exposed: No Joint Exposed: No Bone Exposed: No Periwound Skin Texture Texture Color No Abnormalities Noted: Yes No Abnormalities Noted: Yes Moisture Temperature / Pain No Abnormalities Noted: Yes Temperature: No Abnormality Electronic Signature(s) Signed: 04/07/2023 3:58:28 PM By: Samuella BruinHerrington, Taylor Entered By: Samuella BruinHerrington, Taylor on 04/07/2023 14:48:14 -------------------------------------------------------------------------------- Vitals Details Patient Name: Date of Service: Sean Mccoy, Sean Mccoy 04/07/2023 2:45 PM Medical Record Number: [id number] Patient Account Number: [id number] Date of Birth/Sex: Treating RN: 1961/10/01 (62 y.o. Sean PalauM) Herrington, Taylor Primary Care Charron Coultas: Cari Carawaydem, Donna Other Clinician: Referring Undrea Archbold: Treating Frimy Uffelman/Extender: Richarda Osmondannon, Jennifer Odem, Donna Weeks in Treatment: 1 Vital Signs Time Taken: 14:42 Temperature (F): 97.4 Height (in): 70 Pulse (bpm): 114 Weight (lbs): 205 Respiratory Rate (breaths/min): 16 Body Mass Index (BMI): 29.4 Blood Pressure (mmHg): 166/87 Reference Range: 80 - 120 mg / dl Electronic Signature(s) Signed: 04/07/2023 3:58:28 PM By: Samuella BruinHerrington, Taylor Entered By: Samuella BruinHerrington, Taylor on 04/07/2023 14:42:38

## 2023-04-18 ENCOUNTER — Encounter (HOSPITAL_BASED_OUTPATIENT_CLINIC_OR_DEPARTMENT_OTHER): Payer: 59 | Admitting: General Surgery

## 2023-04-18 DIAGNOSIS — I872 Venous insufficiency (chronic) (peripheral): Secondary | ICD-10-CM | POA: Diagnosis not present

## 2023-04-18 DIAGNOSIS — L97312 Non-pressure chronic ulcer of right ankle with fat layer exposed: Secondary | ICD-10-CM | POA: Diagnosis not present

## 2023-04-18 DIAGNOSIS — I10 Essential (primary) hypertension: Secondary | ICD-10-CM | POA: Diagnosis not present

## 2023-04-18 DIAGNOSIS — Z96641 Presence of right artificial hip joint: Secondary | ICD-10-CM | POA: Diagnosis not present

## 2023-04-18 DIAGNOSIS — S91301A Unspecified open wound, right foot, initial encounter: Secondary | ICD-10-CM | POA: Diagnosis not present

## 2023-04-18 DIAGNOSIS — I890 Lymphedema, not elsewhere classified: Secondary | ICD-10-CM | POA: Diagnosis not present

## 2023-04-18 NOTE — Progress Notes (Signed)
ROSHAD, HACK ([id number]) [id number] Page 1 of 7 Visit Report for 04/18/2023 Chief Complaint Document Details Patient Name: Date of Service: Sean Mccoy 04/18/2023 2:45 PM Medical Record Number: [id number] Patient Account Number: [id number] Date of Birth/Sex: Treating RN: 08-Apr-1961 (62 y.o. M) Primary Care Provider: Cari Caraway Other Clinician: Referring Provider: Treating Provider/Extender: Richarda Osmond in Treatment: 3 Information Obtained from: Patient Chief Complaint 07/12/2022; right ankle wound, Right second medial toe wound 03/28/2023: right foot wound Electronic Signature(s) Signed: 04/18/2023 3:40:55 PM By: Duanne Guess MD FACS Entered By: Duanne Guess on 04/18/2023 15:40:55 -------------------------------------------------------------------------------- HPI Details Patient Name: Date of Service: Sean Mccoy 04/18/2023 2:45 PM Medical Record Number: [id number] Patient Account Number: [id number] Date of Birth/Sex: Treating RN: [birth year] (62 y.o. M) Primary Care Provider: Cari Caraway Other Clinician: Referring Provider: Treating Provider/Extender: Richarda Osmond in Treatment: 3 History of Present Illness HPI Description: Admission 07/12/2022 Mr. 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 This is a wound that waxes and wanes in healing. It started 10 months ago. He follows with Dr. Allena Katz, podiatry for this issue. He has been using Betadine wet-to-dry dressings. He has been on several courses of antibiotics for this issue including doxycycline and Keflex. He states that the wound has been stable for the past several months. He has lymphedema and owns  compression stockings but is not currently using them. He denies signs of infection. He also developed a second right toe wound several months ago. He has been using Betadine to this area as well. 8/11; patient presents for follow-up. Unfortunately he missed his last clinic appointments almost been 1 month since have last seen him. 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 Kerlix/Coban was used with PolyMem silver and antibiotic ointment. He states he took this off after 7 days. He is currently been keeping the area covered. He denies signs of infection. We have also been treating a second right toe wound. This is healed. 8/28; patient presents for follow-up. Again he missed his last clinic appointment. He took the wrap off after 7 days. We have been using Kerlix/Coban with PolyMem silver and antibiotic ointment. He has no issues or complaints today. There has been improvement in wound healing. 9/12; patient presents for follow-up. He again missed his last clinic appointment. He took the wrap off after 7 days. He reports no drainage over the past week. Prior to this we have been using PolyMem silver under Kerlix/Coban. READMISSION 03/28/2023 The patient's right ankle wound has stayed closed, but he reports that shortly after his visit in September when the wound closed, he began having problems on his right foot. At this point, he has what appears to be a massive fungal infection on the dorsal surface of his right foot, extending between the first and second toes. It is exquisitely painful and has a remarkably strong foul odor. There is thick slough on the surface, underneath which the tissue is quite raw. He says that his primary care provider put him on oral antibiotics for a week, but he does not recall what antibiotic it was. He has completed this course and is currently not taking anything for the wound. He says he has been applying Neosporin. 04/07/2023: The culture that  I took last week  remarkably did not show any fungal elements. It was polymicrobial and included high levels of Bacteroides along with skin flora and Enterococcus. Augmentin was prescribed and he is currently taking this. The odor has dissipated from his wound. It is still incredibly painful for him. There is a layer of slough on the surface. 04/18/2023: His wound continues to improve. Is substantially smaller and there is very little residual slough. He continues on oral Augmentin. Electronic Signature(s) Signed: 04/18/2023 3:46:29 PM By: Duanne Guess MD FACS Entered By: Duanne Guess on 04/18/2023 15:46:29 Mccoy, Sean ([id number]) [id number] Page 2 of 7 -------------------------------------------------------------------------------- Physical Exam Details Patient Name: Date of Service: Sean Mccoy 04/18/2023 2:45 PM Medical Record Number: [id number] Patient Account Number: [id number] Date of Birth/Sex: Treating RN: 04-06-1961 (62 y.o. M) Primary Care Provider: Cari Caraway Other Clinician: Referring Provider: Treating Provider/Extender: Richarda Osmond in Treatment: 3 Constitutional Hypertensive, asymptomatic. . . . no acute distress. Respiratory Normal work of breathing on room air. Notes 04/18/2023: His wound continues to improve. It is substantially smaller and there is very little residual slough. Electronic Signature(s) Signed: 04/18/2023 3:52:22 PM By: Duanne Guess MD FACS Entered By: Duanne Guess on 04/18/2023 15:52:22 -------------------------------------------------------------------------------- Physician Orders Details Patient Name: Date of Service: Sean Mccoy 04/18/2023 2:45 PM Medical Record Number: [id number] Patient Account Number: [id number] Date of Birth/Sex: Treating RN: May 31, 1961 (62 y.o. Yates Decamp Primary Care Provider: Cari Caraway Other Clinician: Referring Provider: Treating Provider/Extender: Richarda Osmond in Treatment: 3 Verbal / Phone Orders: No Diagnosis Coding ICD-10 Coding Code Description [id number] Non-pressure chronic ulcer of other part of right foot with fat layer exposed I89.0 Lymphedema, not elsewhere classified I10 Essential (primary) hypertension Follow-up Appointments ppointment in 2 weeks. - Dr. Lady Gary - room 2 Return A Anesthetic (In clinic) Topical Lidocaine 4% applied to wound bed Bathing/ Shower/ Hygiene Other Bathing/Shower/Hygiene Orders/Instructions: - soak gauze with Vashe and place it on the wound and let it sit on there for 2-3 minutes. Lightly wipe the wound the Vashe soaked gauze to clean. Edema Control - Lymphedema / SCD / Other Elevate legs to the level of the heart or above for 30 minutes daily and/or when sitting for 3-4 times a day throughout the day. Avoid standing for long periods of time. Patient to wear own compression stockings every day. - Apply compression stockings first thing in the morning and remove at bedtime. Home Health New wound care orders this week; continue Home Health for wound care. May utilize formulary equivalent dressing for wound treatment orders unless otherwise specified. Dressing changes to be completed by Home Health on Monday / Wednesday / Friday except when patient has scheduled visit at [facility]. Other Home Health Orders/Instructions: - PACE Wound Treatment Wound #3 - Foot Wound Laterality: Dorsal, Right Cleanser: Soap and Water 1 x Per Day/30 Days Discharge Instructions: May shower and wash wound with dial antibacterial soap and water prior to dressing change. Cleanser: Wound Cleanser 1 x Per Day/30 Days Discharge Instructions: Cleanse the wound with wound cleanser prior to applying a clean dressing using gauze sponges, not tissue or cotton balls. SHIRL, LUDINGTON ([id number]) [id number] Page 3 of 7 Topical: Gentamicin 1 x Per Day/30 Days Discharge Instructions:  As directed by physician Topical: Mupirocin Ointment 1 x Per Day/30 Days Discharge Instructions: Apply Mupirocin (Bactroban) as instructed Secondary Dressing: T Non-Adherent Dressing, 3x4 in 1 x Per Day/30 Days elfa Discharge Instructions: Apply over primary  dressing as directed. Secondary Dressing: Woven Gauze Sponge, Non-Sterile 4x4 in 1 x Per Day/30 Days Discharge Instructions: Apply over primary dressing as directed. Secured With: [employer], 4.5x3.1 (in/yd) 1 x Per Day/30 Days Discharge Instructions: Secure with Kerlix as directed. Secured With: 58M Medipore H Soft Cloth Surgical T ape, 4 x 10 (in/yd) 1 x Per Day/30 Days Discharge Instructions: Secure with tape as directed. Electronic Signature(s) Signed: 04/18/2023 3:55:54 PM By: Duanne Guess MD FACS Entered By: Duanne Guess on 04/18/2023 15:52:34 -------------------------------------------------------------------------------- Problem List Details Patient Name: Date of Service: Sean Mccoy 04/18/2023 2:45 PM Medical Record Number: [id number] Patient Account Number: [id number] Date of Birth/Sex: Treating RN: 1961/06/01 (62 y.o. M) Primary Care Provider: Cari Caraway Other Clinician: Referring Provider: Treating Provider/Extender: Richarda Osmond in Treatment: 3 Active Problems ICD-10 Encounter Code Description Active Date MDM Diagnosis L97.512 Non-pressure chronic ulcer of other part of right foot with fat layer exposed 03/28/2023 No Yes I89.0 Lymphedema, not elsewhere classified 03/28/2023 No Yes I10 Essential (primary) hypertension 03/28/2023 No Yes Inactive Problems Resolved Problems Electronic Signature(s) Signed: 04/18/2023 3:38:26 PM By: Duanne Guess MD FACS Entered By: Duanne Guess on 04/18/2023 15:38:26 -------------------------------------------------------------------------------- Progress Note Details Patient Name: Date of Service: Sean Mccoy 04/18/2023 2:45  PM Medical Record Number: [id number] Patient Account Number: [id number] Date of Birth/Sex: Treating RN: 12-17-1961 (62 y.o. M) Primary Care Provider: Cari Caraway Other Clinician: Referring Provider: Treating Provider/Extender: Richarda Osmond in Treatment: 3 Schrupp, Doron ([id number]) [id number] Page 4 of 7 Subjective Chief Complaint Information obtained from Patient 07/12/2022; right ankle wound, Right second medial toe wound 03/28/2023: right foot wound History of Present Illness (HPI) Admission 07/12/2022 Mr. 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 This is a wound that waxes and wanes in healing. It started 10 months ago. He follows with Dr. Allena Katz, podiatry for this issue. He has been using Betadine wet-to-dry dressings. He has been on several courses of antibiotics for this issue including doxycycline and Keflex. He states that the wound has been stable for the past several months. He has lymphedema and owns compression stockings but is not currently using them. He denies signs of infection. He also developed a second right toe wound several months ago. He has been using Betadine to this area as well. 8/11; patient presents for follow-up. Unfortunately he missed his last clinic appointments almost been 1 month since have last seen him. 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 Kerlix/Coban was used with PolyMem silver and antibiotic ointment. He states he took this off after 7 days. He is currently been keeping the area covered. He denies signs of infection. We have also been treating a second right toe wound. This is healed. 8/28; patient presents for follow-up. Again he missed his last clinic  appointment. He took the wrap off after 7 days. We have been using Kerlix/Coban with PolyMem silver and antibiotic ointment. He has no issues or complaints today. There has been improvement in wound healing. 9/12; patient presents for follow-up. He again missed his last clinic appointment. He took the wrap off after 7 days. He reports no drainage over the past week. Prior to this we have been using PolyMem silver under Kerlix/Coban. READMISSION 03/28/2023 The  patient's right ankle wound has stayed closed, but he reports that shortly after his visit in September when the wound closed, he began having problems on his right foot. At this point, he has what appears to be a massive fungal infection on the dorsal surface of his right foot, extending between the first and second toes. It is exquisitely painful and has a remarkably strong foul odor. There is thick slough on the surface, underneath which the tissue is quite raw. He says that his primary care provider put him on oral antibiotics for a week, but he does not recall what antibiotic it was. He has completed this course and is currently not taking anything for the wound. He says he has been applying Neosporin. 04/07/2023: The culture that I took last week remarkably did not show any fungal elements. It was polymicrobial and included high levels of Bacteroides along with skin flora and Enterococcus. Augmentin was prescribed and he is currently taking this. The odor has dissipated from his wound. It is still incredibly painful for him. There is a layer of slough on the surface. 04/18/2023: His wound continues to improve. Is substantially smaller and there is very little residual slough. He continues on oral Augmentin. Patient History Information obtained from Patient, Chart. Family History Diabetes - Mother, Stroke - Father, No family history of Cancer, Heart Disease, Hereditary Spherocytosis, Hypertension, Kidney Disease, Lung Disease, Seizures,  Thyroid Problems, Tuberculosis. Social History Never smoker, Marital Status - Single, Alcohol Use - Rarely, Drug Use - No History, Caffeine Use - Rarely. Medical History Cardiovascular Patient has history of Hypertension Musculoskeletal Patient has history of Rheumatoid Arthritis Medical A Surgical History Notes nd Genitourinary Enlarged Prostate Psychiatric Depression, Anxiety, PTSD Objective Constitutional Hypertensive, asymptomatic. no acute distress. Vitals Time Taken: 2:48 AM, Height: 70 in, Weight: 205 lbs, BMI: 29.4, Temperature: 98.2 F, Pulse: 92 bpm, Respiratory Rate: 18 breaths/min, Blood Pressure: 165/91 mmHg. Respiratory Normal work of breathing on room air. General Notes: 04/18/2023: His wound continues to improve. It is substantially smaller and there is very little residual slough. Integumentary (Hair, Skin) Dicarlo, Alfonzo ([id number]) [id number] Page 5 of 7 Wound #3 status is Open. Original cause of wound was Gradually Appeared. The date acquired was: 09/26/2022. The wound has been in treatment 3 weeks. The wound is located on the Right,Dorsal Foot. The wound measures 5.1cm length x 7.5cm width x 0.1cm depth; 30.041cm^2 area and 3.004cm^3 volume. There is Fat Layer (Subcutaneous Tissue) exposed. There is no tunneling or undermining noted. There is a medium amount of serosanguineous drainage noted. The wound margin is distinct with the outline attached to the wound base. There is large (67-100%) red, pink granulation within the wound bed. There is a small (1- 33%) amount of necrotic tissue within the wound bed including Adherent Slough. The periwound skin appearance had no abnormalities noted for texture. The periwound skin appearance had no abnormalities noted for moisture. The periwound skin appearance had no abnormalities noted for color. Periwound temperature was noted as No Abnormality. The periwound has tenderness on  palpation. Assessment Active Problems ICD-10 Non-pressure chronic ulcer of other part of right foot with fat layer exposed Lymphedema, not elsewhere classified Essential (primary) hypertension Plan Follow-up Appointments: Return Appointment in 2 weeks. - Dr. Lady Gary - room 2 Anesthetic: (In clinic) Topical Lidocaine 4% applied to wound bed Bathing/ Shower/ Hygiene: Other Bathing/Shower/Hygiene Orders/Instructions: - soak gauze with Vashe and place it on the wound and let it sit on there for 2-3 minutes. Lightly wipe the wound  the Vashe soaked gauze to clean. Edema Control - Lymphedema / SCD / Other: Elevate legs to the level of the heart or above for 30 minutes daily and/or when sitting for 3-4 times a day throughout the day. Avoid standing for long periods of time. Patient to wear own compression stockings every day. - Apply compression stockings first thing in the morning and remove at bedtime. Home Health: New wound care orders this week; continue Home Health for wound care. May utilize formulary equivalent dressing for wound treatment orders unless otherwise specified. Dressing changes to be completed by Home Health on Monday / Wednesday / Friday except when patient has scheduled visit at [facility]. Other Home Health Orders/Instructions: - PACE WOUND #3: - Foot Wound Laterality: Dorsal, Right Cleanser: Soap and Water 1 x Per Day/30 Days Discharge Instructions: May shower and wash wound with dial antibacterial soap and water prior to dressing change. Cleanser: Wound Cleanser 1 x Per Day/30 Days Discharge Instructions: Cleanse the wound with wound cleanser prior to applying a clean dressing using gauze sponges, not tissue or cotton balls. Topical: Gentamicin 1 x Per Day/30 Days Discharge Instructions: As directed by physician Topical: Mupirocin Ointment 1 x Per Day/30 Days Discharge Instructions: Apply Mupirocin (Bactroban) as instructed Secondary Dressing: T Non-Adherent  Dressing, 3x4 in 1 x Per Day/30 Days elfa Discharge Instructions: Apply over primary dressing as directed. Secondary Dressing: Woven Gauze Sponge, Non-Sterile 4x4 in 1 x Per Day/30 Days Discharge Instructions: Apply over primary dressing as directed. Secured With: [employer], 4.5x3.1 (in/yd) 1 x Per Day/30 Days Discharge Instructions: Secure with Kerlix as directed. Secured With: 71M Medipore H Soft Cloth Surgical T ape, 4 x 10 (in/yd) 1 x Per Day/30 Days Discharge Instructions: Secure with tape as directed. 04/18/2023: His wound continues to improve. It is substantially smaller and there is very little residual slough. No debridement was necessary today. We will continue the mixture of topical mupirocin and topical gentamicin with T and Kerlix wrapping. He will complete elfa his course of oral Augmentin. He will follow-up in about 10 days, per his request. Electronic Signature(s) Signed: 04/18/2023 3:54:29 PM By: Duanne Guess MD FACS Entered By: Duanne Guess on 04/18/2023 15:54:29 -------------------------------------------------------------------------------- HxROS Details Patient Name: Date of Service: Sean Mccoy 04/18/2023 2:45 PM Medical Record Number: [id number] Patient Account Number: [id number] Date of Birth/Sex: Treating RN: [birth year] (62 y.o. M) Primary Care Provider: Cari Caraway Other Clinician: Referring Provider: Treating Provider/Extender: Artist [place], Derrius ([id number]) [phone].pdf Page 6 of 7 Weeks in Treatment: 3 Information Obtained From Patient Chart Cardiovascular Medical History: Positive for: Hypertension Genitourinary Medical History: Past Medical History Notes: Enlarged Prostate Musculoskeletal Medical History: Positive for: Rheumatoid Arthritis Psychiatric Medical History: Past Medical History Notes: Depression, Anxiety, PTSD Immunizations Pneumococcal Vaccine: Received  Pneumococcal Vaccination: No Implantable Devices None Family and Social History Cancer: No; Diabetes: Yes - Mother; Heart Disease: No; Hereditary Spherocytosis: No; Hypertension: No; Kidney Disease: No; Lung Disease: No; Seizures: No; Stroke: Yes - Father; Thyroid Problems: No; Tuberculosis: No; Never smoker; Marital Status - Single; Alcohol Use: Rarely; Drug Use: No History; Caffeine Use: Rarely; Financial Concerns: No; Food, Clothing or Shelter Needs: No; Support System Lacking: No; Transportation Concerns: No Electronic Signature(s) Signed: 04/18/2023 3:55:54 PM By: Duanne Guess MD FACS Entered By: Duanne Guess on 04/18/2023 15:51:43 -------------------------------------------------------------------------------- SuperBill Details Patient Name: Date of Service: Sean Mccoy 04/18/2023 Medical Record Number: [id number] Patient Account Number: [id number] Date of Birth/Sex: Treating RN: [birth year] (62  y.o. Yates Decamp Primary Care Provider: Cari Caraway Other Clinician: Referring Provider: Treating Provider/Extender: Richarda Osmond in Treatment: 3 Diagnosis Coding ICD-10 Codes Code Description [id number] Non-pressure chronic ulcer of other part of right foot with fat layer exposed I89.0 Lymphedema, not elsewhere classified I10 Essential (primary) hypertension Facility Procedures : CPT4 Code: [id number] Description: 99213 - WOUND CARE VISIT-LEV 3 EST PT Modifier: Quantity: 1 Physician Procedures : CPT4 Code Description Modifier [id number] 99214 - WC PHYS LEVEL 4 - EST PT Clowdus, Gerome ([id number]) [id number] ICD-10 Diagnosis Description L97.512 Non-pressure chronic ulcer of other part of right foot with fat layer exposed I89.0  Lymphedema, not elsewhere classified I10 Essential (primary) hypertension Quantity: 1 [id number] Page 7 of 7 Electronic Signature(s) Signed: 04/18/2023 3:54:42 PM By: Duanne Guess MD FACS Entered By: Duanne Guess on 04/18/2023 15:54:42

## 2023-04-20 NOTE — Progress Notes (Addendum)
Sean Mccoy, Sean Mccoy ([id number]) [id number] Page 1 of 8 Visit Report for 04/18/2023 Arrival Information Details Patient Name: Date of Service: Sean Mccoy 04/18/2023 2:45 PM Medical Record Number: [id number] Patient Account Number: [id number] Date of Birth/Sex: Treating RN: Dec 22, 1961 (62 y.o. Male) Primary Care Sean Mccoy: Sean Mccoy Other Clinician: Referring Sean Mccoy: Treating Sean Mccoy/Extender: Sean Mccoy in Treatment: 3 Visit Information History Since Last Visit All ordered tests and consults were completed: No Patient Arrived: Ambulatory Added or deleted any medications: No Arrival Time: 14:47 Any new allergies or adverse reactions: No Accompanied By: self Had a fall or experienced change in No Transfer Assistance: None activities of daily living that may affect Patient Identification Verified: Yes risk of falls: Secondary Verification Process Completed: Yes Signs or symptoms of abuse/neglect since last visito No Hospitalized since last visit: No Implantable device outside of the clinic excluding No cellular tissue based products placed in the center since last visit: Pain Present Now: No Electronic Signature(s) Signed: 04/18/2023 3:20:08 PM By: Sean Mccoy Entered By: Sean Mccoy on 04/18/2023 14:48:03 -------------------------------------------------------------------------------- Clinic Level of Care Assessment Details Patient Name: Date of Service: Sean Mccoy 04/18/2023 2:45 PM Medical Record Number: [id number] Patient Account Number: [id number] Date of Birth/Sex: Treating RN: [birth year] (62 y.o. Male) Sean Mccoy Primary Care Sean Mccoy: Sean Mccoy Other Clinician: Referring Sean Mccoy: Treating Sean Mccoy/Extender: Sean Mccoy in Treatment: 3 Clinic Level of Care Assessment Items TOOL 4 Quantity Score X- 1 0 Use when only an EandM is performed on FOLLOW-UP visit ASSESSMENTS - Nursing  Assessment / Reassessment X- 1 10 Reassessment of Co-morbidities (includes updates in patient status) X- 1 5 Reassessment of Adherence to Treatment Plan ASSESSMENTS - Wound and Skin A ssessment / Reassessment []  - 0 Simple Wound Assessment / Reassessment - one wound X- 1 5 Complex Wound Assessment / Reassessment - multiple wounds []  - 0 Dermatologic / Skin Assessment (not related to wound area) ASSESSMENTS - Focused Assessment []  - 0 Circumferential Edema Measurements - multi extremities []  - 0 Nutritional Assessment / Counseling / Intervention [place], Jahlon ([id number]) [id number] Page 2 of 8 []  - 0 Lower Extremity Assessment (monofilament, tuning fork, pulses) []  - 0 Peripheral Arterial Disease Assessment (using hand held doppler) ASSESSMENTS - Ostomy and/or Continence Assessment and Care []  - 0 Incontinence Assessment and Management []  - 0 Ostomy Care Assessment and Management (repouching, etc.) PROCESS - Coordination of Care []  - 0 Simple Patient / Family Education for ongoing care X- 1 20 Complex (extensive) Patient / Family Education for ongoing care X- 1 10 Staff obtains Chiropractor, Records, T Results / Process Orders est []  - 0 Staff telephones HHA, Nursing Homes / Clarify orders / etc []  - 0 Routine Transfer to another Facility (non-emergent condition) []  - 0 Routine Hospital Admission (non-emergent condition) []  - 0 New Admissions / Manufacturing engineer / Ordering NPWT Apligraf, etc. , []  - 0 Emergency Hospital Admission (emergent condition) []  - 0 Simple Discharge Coordination X- 1 15 Complex (extensive) Discharge Coordination PROCESS - Special Needs []  - 0 Pediatric / Minor Patient Management []  - 0 Isolation Patient Management []  - 0 Hearing / Language / Visual special needs []  - 0 Assessment of Community assistance (transportation, D/C planning, etc.) []  - 0 Additional assistance / Altered mentation []  - 0 Support  Surface(s) Assessment (bed, cushion, seat, etc.) INTERVENTIONS - Wound Cleansing / Measurement []  - 0 Simple Wound Cleansing - one wound X- 1 5 Complex Wound Cleansing - multiple wounds X-  1 5 Wound Imaging (photographs - any number of wounds) []  - 0 Wound Tracing (instead of photographs) []  - 0 Simple Wound Measurement - one wound X- 1 5 Complex Wound Measurement - multiple wounds INTERVENTIONS - Wound Dressings []  - 0 Small Wound Dressing one or multiple wounds []  - 0 Medium Wound Dressing one or multiple wounds X- 1 20 Large Wound Dressing one or multiple wounds X- 1 5 Application of Medications - topical []  - 0 Application of Medications - injection INTERVENTIONS - Miscellaneous []  - 0 External ear exam []  - 0 Specimen Collection (cultures, biopsies, blood, body fluids, etc.) []  - 0 Specimen(s) / Culture(s) sent or taken to Lab for analysis []  - 0 Patient Transfer (multiple staff / Nurse, adult / Similar devices) []  - 0 Simple Staple / Suture removal (25 or less) []  - 0 Complex Staple / Suture removal (26 or more) []  - 0 Hypo / Hyperglycemic Management (close monitor of Blood Glucose) Mccoy, Sean ([id number]) [id number] Page 3 of 8 []  - 0 Ankle / Brachial Index (ABI) - do not check if billed separately X- 1 5 Vital Signs Has the patient been seen at the hospital within the last three years: Yes Total Score: 110 Level Of Care: New/Established - Level 3 Electronic Signature(s) Signed: 04/20/2023 10:17:35 AM By: Sean Mccoy Entered By: Sean Mccoy on 04/18/2023 15:46:34 -------------------------------------------------------------------------------- Encounter Discharge Information Details Patient Name: Date of Service: Sean Mccoy 04/18/2023 2:45 PM Medical Record Number: [id number] Patient Account Number: [id number] Date of Birth/Sex: Treating RN: 1961-05-28 (62 y.o. Male) Sean Mccoy Primary Care Sean Mccoy: Sean Mccoy  Other Clinician: Referring Sean Mccoy: Treating Sean Mccoy/Extender: Sean Mccoy in Treatment: 3 Encounter Discharge Information Items Discharge Condition: Stable Ambulatory Status: Ambulatory Discharge Destination: Home Transportation: Private Auto Accompanied By: self Schedule Follow-up Appointment: Yes Clinical Summary of Care: Patient Declined Electronic Signature(s) Signed: 04/20/2023 10:17:35 AM By: Sean Mccoy Entered By: Sean Mccoy on 04/18/2023 16:07:17 -------------------------------------------------------------------------------- Lower Extremity Assessment Details Patient Name: Date of Service: Sean Mccoy 04/18/2023 2:45 PM Medical Record Number: [id number] Patient Account Number: [id number] Date of Birth/Sex: Treating RN: 1961/05/09 (62 y.o. Male) Sean Mccoy Primary Care Sueellen Kayes: Sean Mccoy Other Clinician: Referring Artemis Loyal: Treating Miaya Lafontant/Extender: Sean Mccoy in Treatment: 3 Edema Assessment Assessed: Kyra Searles: No] [Right: Yes] Edema: [Left: Ye] [Right: s] Calf Left: Right: Point of Measurement: From Medial Instep 40 cm Ankle Left: Right: Point of Measurement: From Medial Instep 27.5 cm Vascular Assessment Tarbet, Abimelec ([id number]) [Right:[id number] Page 4 of 8] Pulses: Dorsalis Pedis Palpable: [Right:Yes] Electronic Signature(s) Signed: 04/20/2023 10:17:35 AM By: Sean Mccoy Entered By: Sean Mccoy on 04/18/2023 15:21:03 -------------------------------------------------------------------------------- Multi Wound Chart Details Patient Name: Date of Service: Sean Mccoy 04/18/2023 2:45 PM Medical Record Number: [id number] Patient Account Number: [id number] Date of Birth/Sex: Treating RN: [birth year] (62 y.o. Male) Primary Care Garmon Dehn: Sean Mccoy Other Clinician: Referring Deondrea Markos: Treating Jymir Dunaj/Extender: Sean Mccoy in  Treatment: 3 Vital Signs Height(in): 70 Pulse(bpm): 92 Weight(lbs): 205 Blood Pressure(mmHg): 165/91 Body Mass Index(BMI): 29.4 Temperature(F): 98.2 Respiratory Rate(breaths/min): 18 [3:Photos:] [N/A:N/A] Right, Dorsal Foot N/A N/A Wound Location: Gradually Appeared N/A N/A Wounding Event: Abrasion N/A N/A Primary Etiology: Hypertension, Rheumatoid Arthritis N/A N/A Comorbid History: 09/26/2022 N/A N/A Date Acquired: 3 N/A N/A Weeks of Treatment: Open N/A N/A Wound Status: No N/A N/A Wound Recurrence: 5.1x7.5x0.1 N/A N/A Measurements L x W x D (cm) 30.041 N/A N/A A (cm) : rea 3.004 N/A N/A  Volume (cm) : 31.50% N/A N/A % Reduction in A rea: 31.50% N/A N/A % Reduction in Volume: Full Thickness Without Exposed N/A N/A Classification: Support Structures Medium N/A N/A Exudate Amount: Serosanguineous N/A N/A Exudate Type: red, brown N/A N/A Exudate Color: Distinct, outline attached N/A N/A Wound Margin: Large (67-100%) N/A N/A Granulation Amount: Red, Pink N/A N/A Granulation Quality: Small (1-33%) N/A N/A Necrotic Amount: Fat Layer (Subcutaneous Tissue): Yes N/A N/A Exposed Structures: Fascia: No Tendon: No Muscle: No Joint: No Bone: No Small (1-33%) N/A N/A Epithelialization: No Abnormalities Noted N/A N/A Periwound Skin Texture: No Abnormalities Noted N/A N/A Periwound Skin Moisture: No Abnormalities Noted N/A N/A Periwound Skin Color: No Abnormality N/A N/A Temperature: Yes N/A N/A Tenderness on PalpationBrendon Collington, Issiac ([id number]) [id number] Page 5 of 8 Treatment Notes Electronic Signature(s) Signed: 04/18/2023 3:40:46 PM By: Duanne Guess MD FACS Entered By: Duanne Guess on 04/18/2023 15:40:46 -------------------------------------------------------------------------------- Multi-Disciplinary Care Plan Details Patient Name: Date of Service: Sean Mccoy 04/18/2023 2:45 PM Medical Record Number:  [id number] Patient Account Number: [id number] Date of Birth/Sex: Treating RN: 1961-12-04 (62 y.o. Male) Sean Mccoy Primary Care Aldea Avis: Sean Mccoy Other Clinician: Referring Bart Ashford: Treating Xitlalli Newhard/Extender: Sean Mccoy in Treatment: 3 Active Inactive Electronic Signature(s) Signed: 06/27/2023 2:59:14 PM By: Sean Mccoy Signed: 06/28/2023 3:05:27 PM By: Samuella Bruin Previous Signature: 04/20/2023 10:17:35 AM Version By: Sean Mccoy Entered By: Samuella Bruin on 06/22/2023 14:55:00 -------------------------------------------------------------------------------- Pain Assessment Details Patient Name: Date of Service: Sean Mccoy 04/18/2023 2:45 PM Medical Record Number: [id number] Patient Account Number: [id number] Date of Birth/Sex: Treating RN: 01-30-1961 (62 y.o. Male) Primary Care Rafal Archuleta: Sean Mccoy Other Clinician: Referring Gerrick Ray: Treating Juelle Dickmann/Extender: Sean Mccoy in Treatment: 3 Active Problems Location of Pain Severity and Description of Pain Patient Has Paino Yes Site Locations Rate the pain. Current Pain Level: 10 Worst Pain Level: 10 Least Pain Level: 0 Tolerable Pain Level: 2 Pain Management and Medication Hussein, Gaines ([id number]) [id number] Page 6 of 8 Current Pain Management: Electronic Signature(s) Signed: 04/18/2023 3:20:08 PM By: Sean Mccoy Entered By: Sean Mccoy on 04/18/2023 14:48:43 -------------------------------------------------------------------------------- Patient/Caregiver Education Details Patient Name: Date of Service: Sean Mccoy 4/22/2024andnbsp2:45 PM Medical Record Number: [id number] Patient Account Number: [id number] Date of Birth/Gender: Treating RN: 25-Sep-1961 (62 y.o. Male) Sean Mccoy Primary Care Physician: Sean Mccoy Other Clinician: Referring Physician: Treating Physician/Extender: Sean Mccoy in Treatment: 3 Education Assessment Education Provided To: Patient Education Topics Provided Wound/Skin Impairment: Methods: Explain/Verbal Responses: State content correctly Electronic Signature(s) Signed: 04/20/2023 10:17:35 AM By: Sean Mccoy Entered By: Sean Mccoy on 04/18/2023 15:30:00 -------------------------------------------------------------------------------- Wound Assessment Details Patient Name: Date of Service: Sean Mccoy 04/18/2023 2:45 PM Medical Record Number: [id number] Patient Account Number: [id number] Date of Birth/Sex: Treating RN: 12-Feb-1961 (62 y.o. Male) Sean Mccoy Primary Care Hailynn Slovacek: Sean Mccoy Other Clinician: Referring Tyshae Stair: Treating Makaylen Thieme/Extender: Sean Mccoy in Treatment: 3 Wound Status Wound Number: 3 Primary Etiology: Abrasion Wound Location: Right, Dorsal Foot Wound Status: Open Wounding Event: Gradually Appeared Comorbid History: Hypertension, Rheumatoid Arthritis Date Acquired: 09/26/2022 Weeks Of Treatment: 3 Clustered Wound: No Photos [place], Jermall ([id number]) [id number] Page 7 of 8 Wound Measurements Length: (cm) 5.1 Width: (cm) 7.5 Depth: (cm) 0.1 Area: (cm) 30.041 Volume: (cm) 3.004 % Reduction in Area: 31.5% % Reduction in Volume: 31.5% Epithelialization: Small (1-33%) Tunneling: No Undermining: No Wound Description Classification: Full Thickness Without Exposed Support Structures Wound Margin: Distinct, outline attached  Exudate Amount: Medium Exudate Type: Serosanguineous Exudate Color: red, brown Foul Odor After Cleansing: No Slough/Fibrino Yes Wound Bed Granulation Amount: Large (67-100%) Exposed Structure Granulation Quality: Red, Pink Fascia Exposed: No Necrotic Amount: Small (1-33%) Fat Layer (Subcutaneous Tissue) Exposed: Yes Necrotic Quality: Adherent Slough Tendon Exposed: No Muscle Exposed: No Joint Exposed: No Bone  Exposed: No Periwound Skin Texture Texture Color No Abnormalities Noted: Yes No Abnormalities Noted: Yes Moisture Temperature / Pain No Abnormalities Noted: Yes Temperature: No Abnormality Tenderness on Palpation: Yes Electronic Signature(s) Signed: 04/20/2023 10:17:35 AM By: Sean Mccoy Entered By: Sean Mccoy on 04/18/2023 15:27:55 -------------------------------------------------------------------------------- Vitals Details Patient Name: Date of Service: [name] 04/18/2023 2:45 PM Medical Record Number: [id number] Patient Account Number: [id number] Date of Birth/Sex: Treating RN: February 25, 1961 (62 y.o. Male) Primary Care Taneisha Fuson: Sean Mccoy Other Clinician: Referring Floria Brandau: Treating Angelicia Lessner/Extender: Sean Mccoy in Treatment: 3 Vital Signs Time Taken: 02:48 Temperature (F): 98.2 Height (in): 70 Pulse (bpm): 92 Weight (lbs): 205 Respiratory Rate (breaths/min): 18 Body Mass Index (BMI): 29.4 Blood Pressure (mmHg): 165/91 Reference Range: 80 - 120 mg / dl Bradham, Durant ([id number]) [id number] Page 8 of 8 Electronic Signature(s) Signed: 04/18/2023 3:20:08 PM By: Sean Mccoy Entered By: Sean Mccoy on 04/18/2023 14:48:25

## 2023-05-02 ENCOUNTER — Ambulatory Visit (HOSPITAL_BASED_OUTPATIENT_CLINIC_OR_DEPARTMENT_OTHER): Payer: Medicaid Other | Admitting: General Surgery

## 2023-06-10 ENCOUNTER — Ambulatory Visit (HOSPITAL_BASED_OUTPATIENT_CLINIC_OR_DEPARTMENT_OTHER): Payer: Medicaid Other | Admitting: General Surgery

## 2023-06-22 ENCOUNTER — Ambulatory Visit (HOSPITAL_BASED_OUTPATIENT_CLINIC_OR_DEPARTMENT_OTHER): Payer: Medicaid Other | Admitting: General Surgery

## 2023-07-04 ENCOUNTER — Other Ambulatory Visit: Payer: Self-pay | Admitting: Family Medicine

## 2023-07-04 ENCOUNTER — Ambulatory Visit
Admission: RE | Admit: 2023-07-04 | Discharge: 2023-07-04 | Disposition: A | Discharge status: Home / Self Care | Payer: Medicaid Other | Source: Ambulatory Visit | Attending: Family Medicine | Admitting: Family Medicine

## 2023-07-04 DIAGNOSIS — G8929 Other chronic pain: Secondary | ICD-10-CM

## 2023-07-04 DIAGNOSIS — M542 Cervicalgia: Secondary | ICD-10-CM

## 2023-07-12 ENCOUNTER — Encounter: Payer: Self-pay | Admitting: Family Medicine

## 2023-08-02 ENCOUNTER — Other Ambulatory Visit: Payer: Self-pay

## 2023-08-02 ENCOUNTER — Encounter (HOSPITAL_COMMUNITY): Payer: Self-pay

## 2023-08-02 ENCOUNTER — Emergency Department (HOSPITAL_COMMUNITY): Payer: 59

## 2023-08-02 ENCOUNTER — Emergency Department (HOSPITAL_COMMUNITY)
Admission: EM | Admit: 2023-08-02 | Discharge: 2023-08-02 | Disposition: A | Discharge status: Home / Self Care | Payer: 59 | Attending: Emergency Medicine | Admitting: Emergency Medicine

## 2023-08-02 DIAGNOSIS — X58XXXA Exposure to other specified factors, initial encounter: Secondary | ICD-10-CM | POA: Insufficient documentation

## 2023-08-02 DIAGNOSIS — Z8616 Personal history of COVID-19: Secondary | ICD-10-CM | POA: Insufficient documentation

## 2023-08-02 DIAGNOSIS — S91301A Unspecified open wound, right foot, initial encounter: Secondary | ICD-10-CM | POA: Diagnosis not present

## 2023-08-02 DIAGNOSIS — S99921A Unspecified injury of right foot, initial encounter: Secondary | ICD-10-CM | POA: Diagnosis present

## 2023-08-02 DIAGNOSIS — L089 Local infection of the skin and subcutaneous tissue, unspecified: Secondary | ICD-10-CM

## 2023-08-02 DIAGNOSIS — M19071 Primary osteoarthritis, right ankle and foot: Secondary | ICD-10-CM | POA: Diagnosis not present

## 2023-08-02 DIAGNOSIS — M85871 Other specified disorders of bone density and structure, right ankle and foot: Secondary | ICD-10-CM | POA: Diagnosis not present

## 2023-08-02 DIAGNOSIS — M7989 Other specified soft tissue disorders: Secondary | ICD-10-CM | POA: Diagnosis not present

## 2023-08-02 DIAGNOSIS — I10 Essential (primary) hypertension: Secondary | ICD-10-CM | POA: Insufficient documentation

## 2023-08-02 DIAGNOSIS — M85861 Other specified disorders of bone density and structure, right lower leg: Secondary | ICD-10-CM | POA: Diagnosis not present

## 2023-08-02 LAB — CBC WITH DIFFERENTIAL/PLATELET
Abs Immature Granulocytes: 0.01 10*3/uL (ref 0.00–0.07)
Basophils Absolute: 0 10*3/uL (ref 0.0–0.1)
Basophils Relative: 1 %
Eosinophils Absolute: 0.1 10*3/uL (ref 0.0–0.5)
Eosinophils Relative: 2 %
HCT: 31.1 % — ABNORMAL LOW (ref 39.0–52.0)
Hemoglobin: 10.3 g/dL — ABNORMAL LOW (ref 13.0–17.0)
Immature Granulocytes: 0 %
Lymphocytes Relative: 23 %
Lymphs Abs: 1.2 10*3/uL (ref 0.7–4.0)
MCH: 31.3 pg (ref 26.0–34.0)
MCHC: 33.1 g/dL (ref 30.0–36.0)
MCV: 94.5 fL (ref 80.0–100.0)
Monocytes Absolute: 0.8 10*3/uL (ref 0.1–1.0)
Monocytes Relative: 15 %
Neutro Abs: 3.1 10*3/uL (ref 1.7–7.7)
Neutrophils Relative %: 59 %
Platelets: 183 10*3/uL (ref 150–400)
RBC: 3.29 MIL/uL — ABNORMAL LOW (ref 4.22–5.81)
RDW: 13.8 % (ref 11.5–15.5)
WBC: 5.3 10*3/uL (ref 4.0–10.5)
nRBC: 0 % (ref 0.0–0.2)

## 2023-08-02 LAB — BASIC METABOLIC PANEL
Anion gap: 21 — ABNORMAL HIGH (ref 5–15)
BUN: <5 mg/dL — ABNORMAL LOW (ref 8–23)
CO2: 19 mmol/L — ABNORMAL LOW (ref 22–32)
Calcium: 9.3 mg/dL (ref 8.9–10.3)
Chloride: 95 mmol/L — ABNORMAL LOW (ref 98–111)
Creatinine, Ser: 0.71 mg/dL (ref 0.61–1.24)
GFR, Estimated: >60 mL/min (ref >60)
Glucose, Bld: 95 mg/dL (ref 70–99)
Potassium: 3.4 mmol/L — ABNORMAL LOW (ref 3.5–5.1)
Sodium: 135 mmol/L (ref 135–145)

## 2023-08-02 MED ORDER — OXYCODONE HCL 5 MG PO TABS
5.0000 mg | ORAL_TABLET | Freq: Once | ORAL | Status: AC
  Administered 2023-08-02: 5 mg via ORAL
  Filled 2023-08-02: qty 1

## 2023-08-02 MED ORDER — ACETAMINOPHEN 500 MG PO TABS
1000.0000 mg | ORAL_TABLET | Freq: Once | ORAL | Status: AC
  Administered 2023-08-02: 1000 mg via ORAL
  Filled 2023-08-02: qty 2

## 2023-08-02 MED ORDER — CEPHALEXIN 500 MG PO CAPS: 500.0000 mg | ORAL_CAPSULE | Freq: Four times a day (QID) | ORAL | 0 refills | Status: AC

## 2023-08-02 MED ORDER — SODIUM CHLORIDE 0.9 % IV SOLN
1.0000 g | Freq: Once | INTRAVENOUS | Status: AC
  Administered 2023-08-02: 1 g via INTRAVENOUS
  Filled 2023-08-02: qty 10

## 2023-08-02 NOTE — ED Provider Notes (Signed)
[facility] Provider Note   CSN: [id number] Arrival date & time: 08/02/23  1052     History  Chief Complaint  Patient presents with   Wound Check    Sean Mccoy is a 62 y.o. male with PMH as listed below who presents with wound to anterior right foot that has been there for several months. Over the last several days to weeks it has gotten worse. Pain has been severe and he notes increased drainage from wound on dorsal distal foot. Denies f/c, constitutional symptoms including N/V. States it has chronically been worsening. Denies antibiotic use currently. Does think it is infected. Follows with wound care.   Past Medical History:  Diagnosis Date   Abrasion of face 11/06/2018   Anxiety    COVID-19    Depression    Enlarged prostate    HTN (hypertension) 05/04/2019   Orbital floor fracture (HCC) 11/06/2018   right   PTSD (post-traumatic stress disorder)    Rheumatoid arthritis (HCC)        Home Medications Prior to Admission medications   Medication Sig Start Date End Date Taking? Authorizing Provider  cephALEXin (KEFLEX) 500 MG capsule Take 1 capsule (500 mg total) by mouth 4 (four) times daily for 7 days. 08/02/23 08/09/23 Yes Loetta Rough, MD  hydrocortisone 2.5 % lotion Apply topically 2 (two) times daily. 05/27/22   Charlynne Pander, MD  ketoconazole (NIZORAL) 2 % cream Apply 1 application. topically daily. 03/09/22   Arthor Captain, PA-C      Allergies    Patient has no known allergies.    Review of Systems   Review of Systems A 10 point review of systems was performed and is negative unless otherwise reported in HPI.  Physical Exam Updated Vital Signs BP (!) 152/80 (BP Location: Right Arm)   Pulse 86   Temp 98.4 F (36.9 C) (Oral)   Resp 18   Ht 5\' 10"  (1.778 m)   Wt 72.6 kg   SpO2 96%   BMI 22.96 kg/m  Physical Exam General: Normal appearing male, lying in bed.  HEENT: Sclera anicteric, MMM, trachea midline.   Cardiology: RRR, no murmurs/rubs/gallops.  Resp: Normal respiratory rate and effort. CTAB, no wheezes, rhonchi, crackles.  Abd: Soft, non-tender, non-distended. No rebound tenderness or guarding.  GU: Deferred. MSK: 10x7 cm area of ulceration/wound on R distal dorsal foot involving bases of first 3 toes. Skin peeling off when pt removes dry gauze dressing. Very painful to touch. Malodorous. A/w swelling and erythema up leg distal to knee. No palpated crepitus. No bullae, discoloration. Compartments soft. DP/PT pulses palpated bilaterally. Intact cap refill, foot is warm to touch.  Skin: warm, dry.  Neuro: A&Ox4, CNs II-XII grossly intact. MAEs. Sensation grossly intact.  Psych: Normal mood and affect.   ED Results / Procedures / Treatments   Labs (all labs ordered are listed, but only abnormal results are displayed) Labs Reviewed  CBC WITH DIFFERENTIAL/PLATELET - Abnormal; Notable for the following components:      Result Value   RBC 3.29 (*)    Hemoglobin 10.3 (*)    HCT 31.1 (*)    All other components within normal limits  BASIC METABOLIC PANEL - Abnormal; Notable for the following components:   Potassium 3.4 (*)    Chloride 95 (*)    CO2 19 (*)    BUN <5 (*)    Anion gap 21 (*)    All other components within normal  limits    EKG None  Radiology DG Tibia/Fibula Right  Result Date: 08/02/2023 CLINICAL DATA:  Chronic wound. EXAM: RIGHT TIBIA AND FIBULA - 2 VIEW COMPARISON:  Knee x-ray 11/07/2019 FINDINGS: Severe osteopenia. No fracture or dislocation. Preserved adjacent joint spaces. No erosive changes. Diffuse soft tissue swelling. Of note there is some ill-defined sclerosis along the distal femoral metaphysis at the edge of the imaging field. Please correlate for etiology including bone infarct. IMPRESSION: Soft tissue swelling.  Osteopenia.  No definite erosive changes. Ill-defined sclerosis along the distal femoral metaphysis at the edge of the imaging field. Please correlate  for etiology including possible bone infarct. Unchanged from knee x-ray of 11/07/2019 Electronically Signed   By: Karen Kays M.D.   On: 08/02/2023 13:30   DG Foot 2 Views Right  Result Date: 08/02/2023 CLINICAL DATA:  Chronic wound. EXAM: RIGHT FOOT - 2 VIEW COMPARISON:  None Available. FINDINGS: Global soft tissue swelling greatest dorsally about the midfoot. Osteopenia. No fracture or dislocation. Mild degenerative changes of the first metatarsophalangeal joint. No definite erosive changes. If there is further concern of infection including bone infection, additional workup such as bone scan or MRI could be considered as clinically appropriate. IMPRESSION: Soft tissue swelling.  Osteopenia.  Degenerative change. Electronically Signed   By: Karen Kays M.D.   On: 08/02/2023 13:27    Procedures Procedures    Medications Ordered in ED Medications  acetaminophen (TYLENOL) tablet 1,000 mg (1,000 mg Oral Given 08/02/23 1256)  oxyCODONE (Oxy IR/ROXICODONE) immediate release tablet 5 mg (5 mg Oral Given 08/02/23 1256)  cefTRIAXone (ROCEPHIN) 1 g in sodium chloride 0.9 % 100 mL IVPB (0 g Intravenous Stopped 08/02/23 1428)    ED Course/ Medical Decision Making/ A&P                          Medical Decision Making Amount and/or Complexity of Data Reviewed Labs: ordered. Decision-making details documented in ED Course. Radiology: ordered.  Risk OTC drugs. Prescription drug management.    This patient presents to the ED for concern of wound infection, this involves an extensive number of treatment options, and is a complaint that carries with it a high risk of complications and morbidity.  I considered the following differential and admission for this acute, potentially life threatening condition. Afebrile, overall well-appearing. No tachycardia/tachypnea, neg for SIRS.  MDM:    Consider cellulitis top of differential of chronic wound. Also considered nec fasc, though wound is chronically  worsening not acutely, and he is afebrile, vitally stable, well-appearing overall. Doubt nec fasc, and XRs neg for SQ air. Also considered osteomyelitis, but wound is shallow, no erosion on XR, lower concern. Intact pulses, no n/t, soft compartments, no c/f arterial occlusion or compartment syndrome. Does have asymmetrically swollen leg but clinically greater concern for cellulitis/soft tissue infection than DVT. Will treat w/ pain meds here and IV ceftriaxone. Will DC w/ keflex and close f/u with wound care/PCP. Instructed patient to return to ED if he does not improve within 2-3 days.    Clinical Course as of 08/02/23 1508  Tue Aug 02, 2023  1233 WBC: 5.3 No leukocytosis [HN]  1508 Basic metabolic panel(!) [HN]  1508 Basic metabolic panel(!) Similar to one year ago. Unremarkable in the context of this patient's presentation  [HN]    Clinical Course User Index [HN] Loetta Rough, MD    Labs: I Ordered, and personally interpreted labs.  The pertinent results include:  those listed above  Imaging Studies ordered: I ordered imaging studies including foot/tib/fib XR I independently visualized and interpreted imaging. I agree with the radiologist interpretation  Additional history obtained from chart review.    Reevaluation: After the interventions noted above, I reevaluated the patient and found that they have :improved  Social Determinants of Health: Lives independently  Disposition:  DC w/ discharge instructions/return precautions. All questions answered to patient's satisfaction.    Co morbidities that complicate the patient evaluation  Past Medical History:  Diagnosis Date   Abrasion of face 11/06/2018   Anxiety    COVID-19    Depression    Enlarged prostate    HTN (hypertension) 05/04/2019   Orbital floor fracture (HCC) 11/06/2018   right   PTSD (post-traumatic stress disorder)    Rheumatoid arthritis (HCC)      Medicines Meds ordered this encounter  Medications    acetaminophen (TYLENOL) tablet 1,000 mg   oxyCODONE (Oxy IR/ROXICODONE) immediate release tablet 5 mg   cefTRIAXone (ROCEPHIN) 1 g in sodium chloride 0.9 % 100 mL IVPB    Order Specific Question:   Antibiotic Indication:    Answer:   Cellulitis   cephALEXin (KEFLEX) 500 MG capsule    Sig: Take 1 capsule (500 mg total) by mouth 4 (four) times daily for 7 days.    Dispense:  28 capsule    Refill:  0    I have reviewed the patients home medicines and have made adjustments as needed  Problem List / ED Course: Problem List Items Addressed This Visit   None Visit Diagnoses     Wound infection    -  Primary   Relevant Medications   cefTRIAXone (ROCEPHIN) 1 g in sodium chloride 0.9 % 100 mL IVPB (Completed)   cephALEXin (KEFLEX) 500 MG capsule                   This note was created using dictation software, which may contain spelling or grammatical errors.    Loetta Rough, MD 08/02/23 [id number]

## 2023-08-02 NOTE — ED Notes (Signed)
Patient transported to X-ray 

## 2023-08-02 NOTE — Discharge Instructions (Signed)
Thank you for coming to [facility]. You were seen for foot pain and wound infection. We did an exam, labs, and imaging, and these showed likely a wound infection. You were given IV antibiotics in the ED and will be discharged with keflex for 7 days. Please take all the antibiotics. Please follow up with your primary care provider within 1 week.  If you have not begun to improve in the next 2-3 days, please return to the ED.   Do not hesitate to return to the ED or call 911 if you experience: -Worsening symptoms -Lightheadedness, passing out -Fevers/chills -Anything else that concerns you

## 2023-08-02 NOTE — ED Triage Notes (Signed)
Pt has wound to anterior right foot that has been there for several months. Swelling and discoloration noted up the leg.

## 2023-08-26 ENCOUNTER — Other Ambulatory Visit: Payer: Self-pay | Admitting: Internal Medicine

## 2023-08-26 DIAGNOSIS — M47812 Spondylosis without myelopathy or radiculopathy, cervical region: Secondary | ICD-10-CM

## 2023-09-02 ENCOUNTER — Other Ambulatory Visit: Payer: Self-pay | Admitting: Internal Medicine

## 2023-09-02 DIAGNOSIS — K7469 Other cirrhosis of liver: Secondary | ICD-10-CM

## 2023-09-26 ENCOUNTER — Ambulatory Visit
Admission: RE | Admit: 2023-09-26 | Discharge: 2023-09-26 | Disposition: A | Discharge status: Home / Self Care | Payer: 59 | Source: Ambulatory Visit | Attending: Internal Medicine | Admitting: Internal Medicine

## 2023-09-26 DIAGNOSIS — K760 Fatty (change of) liver, not elsewhere classified: Secondary | ICD-10-CM | POA: Diagnosis not present

## 2023-09-26 DIAGNOSIS — K7469 Other cirrhosis of liver: Secondary | ICD-10-CM

## 2023-10-10 ENCOUNTER — Other Ambulatory Visit: Payer: 59

## 2023-10-14 ENCOUNTER — Emergency Department (HOSPITAL_COMMUNITY): Payer: 59

## 2023-10-14 ENCOUNTER — Inpatient Hospital Stay (HOSPITAL_COMMUNITY)
Admission: EM | Admit: 2023-10-14 | Discharge: 2023-10-20 | DRG: 603 | Disposition: A | Discharge status: Home / Self Care | Payer: 59 | Attending: Family Medicine | Admitting: Family Medicine

## 2023-10-14 ENCOUNTER — Other Ambulatory Visit: Payer: Self-pay

## 2023-10-14 ENCOUNTER — Encounter (HOSPITAL_COMMUNITY): Payer: Self-pay | Admitting: Emergency Medicine

## 2023-10-14 DIAGNOSIS — Z96641 Presence of right artificial hip joint: Secondary | ICD-10-CM | POA: Diagnosis not present

## 2023-10-14 DIAGNOSIS — G8929 Other chronic pain: Secondary | ICD-10-CM | POA: Diagnosis present

## 2023-10-14 DIAGNOSIS — D72819 Decreased white blood cell count, unspecified: Secondary | ICD-10-CM | POA: Diagnosis present

## 2023-10-14 DIAGNOSIS — Z833 Family history of diabetes mellitus: Secondary | ICD-10-CM | POA: Diagnosis not present

## 2023-10-14 DIAGNOSIS — L03031 Cellulitis of right toe: Secondary | ICD-10-CM | POA: Diagnosis present

## 2023-10-14 DIAGNOSIS — Z8249 Family history of ischemic heart disease and other diseases of the circulatory system: Secondary | ICD-10-CM | POA: Diagnosis not present

## 2023-10-14 DIAGNOSIS — N400 Enlarged prostate without lower urinary tract symptoms: Secondary | ICD-10-CM | POA: Diagnosis present

## 2023-10-14 DIAGNOSIS — M25551 Pain in right hip: Secondary | ICD-10-CM | POA: Diagnosis not present

## 2023-10-14 DIAGNOSIS — Z5941 Food insecurity: Secondary | ICD-10-CM

## 2023-10-14 DIAGNOSIS — L89899 Pressure ulcer of other site, unspecified stage: Secondary | ICD-10-CM | POA: Diagnosis not present

## 2023-10-14 DIAGNOSIS — L02611 Cutaneous abscess of right foot: Secondary | ICD-10-CM | POA: Diagnosis not present

## 2023-10-14 DIAGNOSIS — L03119 Cellulitis of unspecified part of limb: Secondary | ICD-10-CM | POA: Diagnosis not present

## 2023-10-14 DIAGNOSIS — S91301A Unspecified open wound, right foot, initial encounter: Secondary | ICD-10-CM | POA: Diagnosis not present

## 2023-10-14 DIAGNOSIS — L0390 Cellulitis, unspecified: Secondary | ICD-10-CM | POA: Diagnosis not present

## 2023-10-14 DIAGNOSIS — L03115 Cellulitis of right lower limb: Principal | ICD-10-CM | POA: Diagnosis present

## 2023-10-14 DIAGNOSIS — M4807 Spinal stenosis, lumbosacral region: Secondary | ICD-10-CM | POA: Diagnosis present

## 2023-10-14 DIAGNOSIS — M069 Rheumatoid arthritis, unspecified: Secondary | ICD-10-CM | POA: Diagnosis present

## 2023-10-14 DIAGNOSIS — I10 Essential (primary) hypertension: Secondary | ICD-10-CM | POA: Diagnosis present

## 2023-10-14 DIAGNOSIS — M5106 Intervertebral disc disorders with myelopathy, lumbar region: Secondary | ICD-10-CM | POA: Diagnosis not present

## 2023-10-14 DIAGNOSIS — D638 Anemia in other chronic diseases classified elsewhere: Secondary | ICD-10-CM | POA: Diagnosis not present

## 2023-10-14 DIAGNOSIS — R5381 Other malaise: Secondary | ICD-10-CM | POA: Diagnosis not present

## 2023-10-14 DIAGNOSIS — F1010 Alcohol abuse, uncomplicated: Secondary | ICD-10-CM | POA: Diagnosis present

## 2023-10-14 DIAGNOSIS — Z79899 Other long term (current) drug therapy: Secondary | ICD-10-CM

## 2023-10-14 DIAGNOSIS — L089 Local infection of the skin and subcutaneous tissue, unspecified: Secondary | ICD-10-CM

## 2023-10-14 DIAGNOSIS — Z91199 Patient's noncompliance with other medical treatment and regimen due to unspecified reason: Secondary | ICD-10-CM

## 2023-10-14 DIAGNOSIS — F4310 Post-traumatic stress disorder, unspecified: Secondary | ICD-10-CM | POA: Diagnosis present

## 2023-10-14 DIAGNOSIS — M19071 Primary osteoarthritis, right ankle and foot: Secondary | ICD-10-CM | POA: Diagnosis not present

## 2023-10-14 DIAGNOSIS — E872 Acidosis, unspecified: Secondary | ICD-10-CM | POA: Diagnosis present

## 2023-10-14 DIAGNOSIS — M48061 Spinal stenosis, lumbar region without neurogenic claudication: Secondary | ICD-10-CM | POA: Diagnosis not present

## 2023-10-14 NOTE — ED Triage Notes (Addendum)
Presents from home for hip pain in same hip that he had a hip replacement 8 years ago.  Endorses pain and cramping in R hip, stiffness, decreased ROM that started 2 weeks ago after hearing a "popping sound." No falls or obvious trauma.  He takes oxycodone for other herniated disc in back, arthritis in knees.  Pt would also like staff to know he has an ulcer on R foot that has been present for a year. He is being treated for this by PCP currently, last visit 2 weeks ago. Pt declines for RN to visualize wound in triage "I'll just wait for the doctor. I just changed my bandage at home"

## 2023-10-15 ENCOUNTER — Emergency Department (HOSPITAL_COMMUNITY): Payer: 59

## 2023-10-15 DIAGNOSIS — L03115 Cellulitis of right lower limb: Secondary | ICD-10-CM

## 2023-10-15 DIAGNOSIS — Z8249 Family history of ischemic heart disease and other diseases of the circulatory system: Secondary | ICD-10-CM | POA: Diagnosis not present

## 2023-10-15 DIAGNOSIS — N400 Enlarged prostate without lower urinary tract symptoms: Secondary | ICD-10-CM | POA: Diagnosis present

## 2023-10-15 DIAGNOSIS — D72819 Decreased white blood cell count, unspecified: Secondary | ICD-10-CM | POA: Diagnosis present

## 2023-10-15 DIAGNOSIS — M4807 Spinal stenosis, lumbosacral region: Secondary | ICD-10-CM | POA: Diagnosis present

## 2023-10-15 DIAGNOSIS — M069 Rheumatoid arthritis, unspecified: Secondary | ICD-10-CM | POA: Diagnosis present

## 2023-10-15 DIAGNOSIS — L03119 Cellulitis of unspecified part of limb: Secondary | ICD-10-CM | POA: Diagnosis not present

## 2023-10-15 DIAGNOSIS — L0390 Cellulitis, unspecified: Secondary | ICD-10-CM | POA: Diagnosis present

## 2023-10-15 DIAGNOSIS — Z833 Family history of diabetes mellitus: Secondary | ICD-10-CM | POA: Diagnosis not present

## 2023-10-15 DIAGNOSIS — Z79899 Other long term (current) drug therapy: Secondary | ICD-10-CM | POA: Diagnosis not present

## 2023-10-15 DIAGNOSIS — I10 Essential (primary) hypertension: Secondary | ICD-10-CM | POA: Diagnosis present

## 2023-10-15 DIAGNOSIS — E872 Acidosis, unspecified: Secondary | ICD-10-CM | POA: Diagnosis present

## 2023-10-15 DIAGNOSIS — M19071 Primary osteoarthritis, right ankle and foot: Secondary | ICD-10-CM | POA: Diagnosis not present

## 2023-10-15 DIAGNOSIS — F1010 Alcohol abuse, uncomplicated: Secondary | ICD-10-CM | POA: Diagnosis present

## 2023-10-15 DIAGNOSIS — L89899 Pressure ulcer of other site, unspecified stage: Secondary | ICD-10-CM | POA: Diagnosis present

## 2023-10-15 DIAGNOSIS — D638 Anemia in other chronic diseases classified elsewhere: Secondary | ICD-10-CM | POA: Diagnosis present

## 2023-10-15 DIAGNOSIS — L02611 Cutaneous abscess of right foot: Secondary | ICD-10-CM | POA: Diagnosis not present

## 2023-10-15 DIAGNOSIS — Z91199 Patient's noncompliance with other medical treatment and regimen due to unspecified reason: Secondary | ICD-10-CM | POA: Diagnosis not present

## 2023-10-15 DIAGNOSIS — L089 Local infection of the skin and subcutaneous tissue, unspecified: Secondary | ICD-10-CM

## 2023-10-15 DIAGNOSIS — S91301A Unspecified open wound, right foot, initial encounter: Secondary | ICD-10-CM | POA: Diagnosis not present

## 2023-10-15 DIAGNOSIS — M7989 Other specified soft tissue disorders: Secondary | ICD-10-CM | POA: Diagnosis not present

## 2023-10-15 DIAGNOSIS — Z96641 Presence of right artificial hip joint: Secondary | ICD-10-CM | POA: Diagnosis not present

## 2023-10-15 DIAGNOSIS — R5381 Other malaise: Secondary | ICD-10-CM | POA: Diagnosis present

## 2023-10-15 DIAGNOSIS — M48061 Spinal stenosis, lumbar region without neurogenic claudication: Secondary | ICD-10-CM | POA: Diagnosis not present

## 2023-10-15 DIAGNOSIS — F4310 Post-traumatic stress disorder, unspecified: Secondary | ICD-10-CM | POA: Diagnosis present

## 2023-10-15 DIAGNOSIS — Z5941 Food insecurity: Secondary | ICD-10-CM | POA: Diagnosis not present

## 2023-10-15 DIAGNOSIS — M5106 Intervertebral disc disorders with myelopathy, lumbar region: Secondary | ICD-10-CM | POA: Diagnosis not present

## 2023-10-15 DIAGNOSIS — G8929 Other chronic pain: Secondary | ICD-10-CM | POA: Diagnosis present

## 2023-10-15 DIAGNOSIS — L03031 Cellulitis of right toe: Secondary | ICD-10-CM | POA: Diagnosis not present

## 2023-10-15 DIAGNOSIS — M25551 Pain in right hip: Secondary | ICD-10-CM | POA: Diagnosis not present

## 2023-10-15 LAB — LACTIC ACID, PLASMA
Lactic Acid, Venous: 2.3 mmol/L (ref 0.5–1.9)
Lactic Acid, Venous: 2.1 mmol/L (ref 0.5–1.9)

## 2023-10-15 LAB — COMPREHENSIVE METABOLIC PANEL
ALT: 16 U/L (ref 0–44)
AST: 29 U/L (ref 15–41)
Albumin: 3.5 g/dL (ref 3.5–5.0)
Alkaline Phosphatase: 97 U/L (ref 38–126)
Anion gap: 17 — ABNORMAL HIGH (ref 5–15)
BUN: 7 mg/dL — ABNORMAL LOW (ref 8–23)
CO2: 23 mmol/L (ref 22–32)
Calcium: 10.2 mg/dL (ref 8.9–10.3)
Chloride: 99 mmol/L (ref 98–111)
Creatinine, Ser: 0.67 mg/dL (ref 0.61–1.24)
GFR, Estimated: >60 mL/min (ref >60)
Glucose, Bld: 87 mg/dL (ref 70–99)
Potassium: 4 mmol/L (ref 3.5–5.1)
Sodium: 139 mmol/L (ref 135–145)
Total Bilirubin: 0.6 mg/dL (ref 0.3–1.2)
Total Protein: 7.7 g/dL (ref 6.5–8.1)

## 2023-10-15 LAB — C-REACTIVE PROTEIN: CRP: 0.7 mg/dL (ref <1.0)

## 2023-10-15 LAB — CBC WITH DIFFERENTIAL/PLATELET
Abs Immature Granulocytes: 0.01 10*3/uL (ref 0.00–0.07)
Basophils Absolute: 0 10*3/uL (ref 0.0–0.1)
Basophils Relative: 1 %
Eosinophils Absolute: 0.1 10*3/uL (ref 0.0–0.5)
Eosinophils Relative: 2 %
HCT: 34.2 % — ABNORMAL LOW (ref 39.0–52.0)
Hemoglobin: 11.1 g/dL — ABNORMAL LOW (ref 13.0–17.0)
Immature Granulocytes: 0 %
Lymphocytes Relative: 37 %
Lymphs Abs: 1.4 10*3/uL (ref 0.7–4.0)
MCH: 30.9 pg (ref 26.0–34.0)
MCHC: 32.5 g/dL (ref 30.0–36.0)
MCV: 95.3 fL (ref 80.0–100.0)
Monocytes Absolute: 0.7 10*3/uL (ref 0.1–1.0)
Monocytes Relative: 18 %
Neutro Abs: 1.5 10*3/uL — ABNORMAL LOW (ref 1.7–7.7)
Neutrophils Relative %: 42 %
Platelets: 200 10*3/uL (ref 150–400)
RBC: 3.59 MIL/uL — ABNORMAL LOW (ref 4.22–5.81)
RDW: 14.4 % (ref 11.5–15.5)
WBC: 3.7 10*3/uL — ABNORMAL LOW (ref 4.0–10.5)
nRBC: 0 % (ref 0.0–0.2)

## 2023-10-15 LAB — SEDIMENTATION RATE: Sed Rate: 40 mm/h — ABNORMAL HIGH (ref 0–16)

## 2023-10-15 MED ORDER — SODIUM CHLORIDE 0.9 % IV SOLN
2.0000 g | Freq: Once | INTRAVENOUS | Status: AC
  Administered 2023-10-15: 2 g via INTRAVENOUS
  Filled 2023-10-15: qty 12.5

## 2023-10-15 MED ORDER — SODIUM CHLORIDE 0.9 % IV SOLN: INTRAVENOUS | Status: AC

## 2023-10-15 MED ORDER — SODIUM CHLORIDE 0.9 % IV SOLN
2.0000 g | Freq: Three times a day (TID) | INTRAVENOUS | Status: DC
  Administered 2023-10-15 – 2023-10-18 (×10): 2 g via INTRAVENOUS
  Filled 2023-10-15 (×10): qty 12.5

## 2023-10-15 MED ORDER — PROCHLORPERAZINE EDISYLATE 10 MG/2ML IJ SOLN: 5.0000 mg | Freq: Four times a day (QID) | INTRAMUSCULAR | Status: DC | PRN

## 2023-10-15 MED ORDER — FENTANYL CITRATE PF 50 MCG/ML IJ SOSY
50.0000 ug | PREFILLED_SYRINGE | Freq: Once | INTRAMUSCULAR | Status: AC
  Administered 2023-10-15: 50 ug via INTRAVENOUS
  Filled 2023-10-15: qty 1

## 2023-10-15 MED ORDER — VANCOMYCIN HCL 1500 MG/300ML IV SOLN
1500.0000 mg | Freq: Once | INTRAVENOUS | Status: DC
  Filled 2023-10-15: qty 300

## 2023-10-15 MED ORDER — ACETAMINOPHEN 325 MG PO TABS
650.0000 mg | ORAL_TABLET | Freq: Four times a day (QID) | ORAL | Status: DC | PRN
  Administered 2023-10-17 – 2023-10-19 (×3): 650 mg via ORAL
  Filled 2023-10-15 (×3): qty 2

## 2023-10-15 MED ORDER — ENOXAPARIN SODIUM 40 MG/0.4ML IJ SOSY
40.0000 mg | PREFILLED_SYRINGE | INTRAMUSCULAR | Status: DC
  Administered 2023-10-15 – 2023-10-20 (×6): 40 mg via SUBCUTANEOUS
  Filled 2023-10-15 (×6): qty 0.4

## 2023-10-15 MED ORDER — SODIUM CHLORIDE 0.9 % IV SOLN: 2.0000 g | INTRAVENOUS | Status: DC

## 2023-10-15 MED ORDER — HYDRALAZINE HCL 25 MG PO TABS
25.0000 mg | ORAL_TABLET | ORAL | Status: DC | PRN
  Administered 2023-10-17: 25 mg via ORAL
  Filled 2023-10-15: qty 1

## 2023-10-15 MED ORDER — VANCOMYCIN HCL IN DEXTROSE 1-5 GM/200ML-% IV SOLN
1000.0000 mg | Freq: Once | INTRAVENOUS | Status: DC
  Filled 2023-10-15: qty 200

## 2023-10-15 MED ORDER — VANCOMYCIN HCL 1250 MG/250ML IV SOLN
1250.0000 mg | Freq: Two times a day (BID) | INTRAVENOUS | Status: DC
  Administered 2023-10-15 – 2023-10-19 (×9): 1250 mg via INTRAVENOUS
  Filled 2023-10-15 (×11): qty 250

## 2023-10-15 MED ORDER — POLYETHYLENE GLYCOL 3350 17 G PO PACK
17.0000 g | PACK | Freq: Every day | ORAL | Status: DC | PRN
  Administered 2023-10-15: 17 g via ORAL
  Filled 2023-10-15: qty 1

## 2023-10-15 MED ORDER — MELATONIN 5 MG PO TABS: 5.0000 mg | ORAL_TABLET | Freq: Every evening | ORAL | Status: DC | PRN

## 2023-10-15 MED ORDER — OXYCODONE HCL 5 MG PO TABS
5.0000 mg | ORAL_TABLET | Freq: Four times a day (QID) | ORAL | Status: DC | PRN
  Administered 2023-10-15 – 2023-10-16 (×5): 5 mg via ORAL
  Filled 2023-10-15 (×5): qty 1

## 2023-10-15 MED ORDER — HYDROMORPHONE HCL 1 MG/ML IJ SOLN
0.5000 mg | INTRAMUSCULAR | Status: DC | PRN
  Administered 2023-10-15 – 2023-10-16 (×5): 0.5 mg via INTRAVENOUS
  Filled 2023-10-15 (×4): qty 0.5
  Filled 2023-10-15: qty 1

## 2023-10-15 NOTE — Progress Notes (Signed)
Pharmacy Antibiotic Note  Sean Mccoy is a 62 y.o. male admitted on 10/14/2023 with R foot pain and wound w/ foul odor and drainage. Pharmacy has been consulted for vancomycin and cefepime dosing. Cr <1.  Plan: Vancomycin 1250mg  IV q12h - est AUC 551 Cefepime 2g IV q8h Follow Cr, Cx, LOT Vancomycin levels PRN  Weight: 72 kg (158 lb 11.7 oz)  Temp (24hrs), Avg:98 F (36.7 C), Min:97.7 F (36.5 C), Max:98.6 F (37 C)  Recent Labs  Lab 10/15/23 0314  WBC 3.7*  CREATININE 0.67  LATICACIDVEN 2.3*    Estimated Creatinine Clearance: 98.8 mL/min (by C-G formula based on SCr of 0.67 mg/dL).    No Known Allergies  Fredonia Highland, PharmD, BCPS, [facility] Clinical Pharmacist Please check AMION for all [facility] Pharmacy numbers 10/15/2023

## 2023-10-15 NOTE — ED Notes (Signed)
Urinal provided at bedside

## 2023-10-15 NOTE — ED Notes (Signed)
Nurse will draw lactic acid plasma

## 2023-10-15 NOTE — Consult Note (Signed)
WOC Nurse Consult Note: this consult performed remotely utilizing EMR records including photo documentation; patient had been followed at [facility] for this wound; last seen 03/2023  Reason for Consult:R dorsal foot wound  Wound type: full thickness medial great toe that appears to extend between great and 2nd digit R foot  Pressure Injury POA: NA  Measurement: see nursing flowsheet  Wound XLK:[id number] to assess entire wound bed as extends between first and 2nd digit, what is visualized appears to be 60% yellow necrotic tissue 40% pink moist  Drainage (amount, consistency, odor) foul smelling purulent drainage per MD note  Periwound: edema, erythema  Dressing procedure/placement/frequency: Clean R 1st and 2nd digits as well as dorsal foot wound with Vashe wound cleanser [facility] [place] [phone]), apply vashe moistened gauze to open wound bed (including between 1st and 2nd digit), cover with dry gauze and secure with Kerlix roll gauze.   Patient is currently being treated for cellulitis in this foot with IV antibiotics.  Has had x-rays with no findings suggestive of osteomyelitis. Patient would benefit from ongoing care at [facility] or with podiatrist.   POC discussed with bedside nurse. WOC team will not follow at this time. Re-consult if further needs arise.   Thank you,    Rosalyn Archambault MSN, RN-BC, CWOCN [phone]  Conservative sharp wound debridement (CSWD performed at the bedside):

## 2023-10-15 NOTE — Progress Notes (Signed)
Patient has arrived to the unit via stretcher from ED. A/O x 4. VSS. Oriented patient to the room and staff. Education provided regarding safety precaution and patient verbalize understanding.

## 2023-10-15 NOTE — Progress Notes (Signed)
Brief progress note: -Patient was admitted earlier today. -As per H&P done earlier today: " Sean Mccoy is a 62 y.o. male with medical history significant for alcohol use, who presents with complaints of right foot wound and pain, as well as right hip pain.  States his wound has been present for at least a year, he was going to wound care clinic for his right foot, stopped going about 3 months ago.  No reported subjective fevers or chills.  He had a right hip replacement 7 years ago.  For the past 2 weeks has had progressively worsening right hip pain.  The patient presented to the ED for further evaluation.   In the ED, right foot x-ray revealed no acute bony abnormalities seen on x-ray.  On exam the right dorsal foot wound infection has a foul odor and is draining purulent body fluid.  Right hip CT scan was nonacute.  Peripheral blood cultures x 2 were obtained.  The patient was started on empiric IV antibiotics for right foot wound and cellulitis.  Admitted by [facility], hospitalist service.   ED Course: Temperature 97.8.  BP 177/113, pulse 81, respiratory 17, O2 saturation 100% on room air.  Lab studies remarkable for WBC 3.7.  Hemoglobin 11.1.  Neutrophil count 1.5".  10/15/2023: Patient seen.  Also discussed extensively with patient's nurse.  Prior documentations noted.  Patient continues to report right-sided hip pain that is chronic.  According to the patient, he is known to Dr. Rayburn Ma with [facility] orthopedics.  Patient has infected dorsum of right foot.  Elevated blood pressure.  Will optimize pain control.  Monitor blood pressure closely.  Patient has continued to mention that he might sign himself out of the hospital AGAINST MEDICAL ADVICE.  Patient has capacity to make medical decisions.  Patient understands that he could become septic and pass on.  Patient voiced understanding of above.

## 2023-10-15 NOTE — H&P (Addendum)
History and Physical  Sean Mccoy OZH:[id number] DOB: March 15, 1961 DOA: 10/14/2023  Referring physician: Dr. Manus Mccoy, EDP  PCP: Sean Dauer, FNP  Outpatient Specialists: Wound care clinic Patient coming from: Home.  Chief Complaint: Right foot infection  HPI: Sean Mccoy is a 62 y.o. male with medical history significant for alcohol use, who presents with complaints of right foot wound and pain, as well as right hip pain.  States his wound has been present for at least a year, he was going to wound care clinic for his right foot, stopped going about 3 months ago.  No reported subjective fevers or chills.  He had a right hip replacement 7 years ago.  For the past 2 weeks has had progressively worsening right hip pain.  The patient presented to the ED for further evaluation.  In the ED, right foot x-ray revealed no acute bony abnormalities seen on x-ray.  On exam the right dorsal foot wound infection has a foul odor and is draining purulent body fluid.  Right hip CT scan was nonacute.  Peripheral blood cultures x 2 were obtained.  The patient was started on empiric IV antibiotics for right foot wound and cellulitis.  Admitted by [facility], hospitalist service.  ED Course: Temperature 97.8.  BP 177/113, pulse 81, respiratory 17, O2 saturation 100% on room air.  Lab studies remarkable for WBC 3.7.  Hemoglobin 11.1.  Neutrophil count 1.5.  Review of Systems: Review of systems as noted in the HPI. All other systems reviewed and are negative.   Past Medical History:  Diagnosis Date   Abrasion of face 11/06/2018   Anxiety    COVID-19    Depression    Enlarged prostate    HTN (hypertension) 05/04/2019   Orbital floor fracture (HCC) 11/06/2018   right   PTSD (post-traumatic stress disorder)    Rheumatoid arthritis (HCC)    Past Surgical History:  Procedure Laterality Date   KNEE ARTHROSCOPY Right    ORIF ORBITAL FRACTURE Right 11/22/2018   Procedure: OPEN REDUCTION INTERNAL FIXATION (ORIF)  RIGHT TRIPOD FRACTURE;  Surgeon: Peggye Form, DO;  Location: [facility];  Service: Plastics;  Laterality: Right;   TOTAL HIP ARTHROPLASTY Right 01/20/2016   Procedure: RIGHT TOTAL HIP ARTHROPLASTY ANTERIOR APPROACH;  Surgeon: Kathryne Hitch, MD;  Location: [facility] OR;  Service: Orthopedics;  Laterality: Right;    Social History:  reports that he has never smoked. He has never used smokeless tobacco. He reports current alcohol use. He reports that he does not use drugs.   No Known Allergies  Family History  Problem Relation Age of Onset   Diabetes Mother    Hypertension Mother    Diabetes Father    Hypertension Father       Prior to Admission medications   Medication Sig Start Date End Date Taking? Authorizing Provider  hydrocortisone 2.5 % lotion Apply topically 2 (two) times daily. 05/27/22   Charlynne Pander, MD  ketoconazole (NIZORAL) 2 % cream Apply 1 application. topically daily. 03/09/22   Arthor Captain, PA-C    Physical Exam: BP (!) 177/113   Pulse 84   Temp 97.7 F (36.5 C)   Resp 17   Wt 72 kg   SpO2 100%   BMI 22.78 kg/m   General: 62 y.o. year-old male well developed well nourished in no acute distress.  Alert and oriented x3. Cardiovascular: Regular rate and rhythm with no rubs or gallops.  No thyromegaly or JVD noted.  1+ lower  extremity edema bilaterally.  Hyperpigmentation likely from venous stasis. Respiratory: Clear to auscultation with no wheezes or rales. Good inspiratory effort. Abdomen: Soft nontender nondistended with normal bowel sounds x4 quadrants. Muskuloskeletal: No cyanosis or clubbing noted bilaterally Neuro: CN II-XII intact, strength, sensation, reflexes Skin:  Psychiatry: Judgement and insight appear normal. Mood is appropriate for condition and setting          Labs on Admission:  Basic Metabolic Panel: Recent Labs  Lab 10/15/23 0314  NA 139  K 4.0  CL 99  CO2 23  GLUCOSE 87  BUN 7*  CREATININE 0.67   CALCIUM 10.2   Liver Function Tests: Recent Labs  Lab 10/15/23 0314  AST 29  ALT 16  ALKPHOS 97  BILITOT 0.6  PROT 7.7  ALBUMIN 3.5   No results for input(s): "LIPASE", "AMYLASE" in the last 168 hours. No results for input(s): "AMMONIA" in the last 168 hours. CBC: Recent Labs  Lab 10/15/23 0314  WBC 3.7*  NEUTROABS 1.5*  HGB 11.1*  HCT 34.2*  MCV 95.3  PLT 200   Cardiac Enzymes: No results for input(s): "CKTOTAL", "CKMB", "CKMBINDEX", "TROPONINI" in the last 168 hours.  BNP (last 3 results) No results for input(s): "BNP" in the last 8760 hours.  ProBNP (last 3 results) No results for input(s): "PROBNP" in the last 8760 hours.  CBG: No results for input(s): "GLUCAP" in the last 168 hours.  Radiological Exams on Admission: CT Hip Right Wo Contrast  Result Date: 10/15/2023 CLINICAL DATA:  Right hip pain.  Stress fracture suspected. EXAM: CT OF THE RIGHT HIP WITHOUT CONTRAST TECHNIQUE: Multidetector CT imaging of the right hip was performed according to the standard protocol. Multiplanar CT image reconstructions were also generated. RADIATION DOSE REDUCTION: This exam was performed according to the departmental dose-optimization program which includes automated exposure control, adjustment of the mA and/or kV according to patient size and/or use of iterative reconstruction technique. COMPARISON:  AP pelvis and AP and frog-leg right hip views yesterday, CT abdomen and pelvis and reconstructions 03/29/2020. FINDINGS: Bones/Joint/Cartilage The bone mineralization is normal. There is a right hip arthroplasty again noted. There are no overt findings of hardware loosening. No dislocation. There are no periprosthetic fractures. No primary pathologic process is seen. There is slight spurring of pubic symphysis, mild narrowing and spurring right SI joint. The visualized sacrum is intact. Chronic dystrophic calcifications are again noted anterior to the superior right acetabulum.  Ligaments Suboptimally assessed by CT. Muscles and Tendons No mass or fluid collections are seen allowing for spray artifact from the metallic hardware. Area tendons are grossly intact. Soft tissues Chronic subcutaneous coarse stranding in the lateral right hip area. No superficial or deep soft tissue hematoma. Structures within the right hemipelvis are largely either obscured due to spray artifact from the hip hardware or collimated out of the field of view. The prostate is normal in size and both testicles are in the scrotal sac. The right side of the bladder is unremarkable where visible. There is nonspecific increased prominence of right inguinal chain nodes. The largest of these is 1.4 cm in short axis with a slightly prominent right external iliac chain node 1 cm short axis also new. IMPRESSION: 1. Right hip arthroplasty with no overt findings of hardware loosening or periprosthetic fractures. No acute bony abnormalities are seen. 2. Chronic subcutaneous coarse stranding in the lateral right hip area. No superficial or deep soft tissue hematoma. 3. Nonspecific increased prominence of right inguinal chain and right external iliac  chain nodes. 4. Limited visualization of the right hemipelvis. No mass or fluid collections as far as seen. Electronically Signed   By: Almira Bar M.D.   On: 10/15/2023 04:38   CT Lumbar Spine Wo Contrast  Result Date: 10/15/2023 CLINICAL DATA:  Acute lumbar myelopathy EXAM: CT LUMBAR SPINE WITHOUT CONTRAST TECHNIQUE: Multidetector CT imaging of the lumbar spine was performed without intravenous contrast administration. Multiplanar CT image reconstructions were also generated. RADIATION DOSE REDUCTION: This exam was performed according to the departmental dose-optimization program which includes automated exposure control, adjustment of the mA and/or kV according to patient size and/or use of iterative reconstruction technique. COMPARISON:  Lumbar radiography 07/04/2023.   Abdominal CT 03/29/2020 FINDINGS: Segmentation: 5 lumbar type vertebrae. Alignment: Normal. Vertebrae: No acute fracture or focal pathologic process. Paraspinal and other soft tissues: Negative. Disc levels: Congenitally narrow lumbar spinal canal. Disc narrowing and bulging that is mild except at L5-S1 where there is disc collapse and gas containing fissure. Mild facet spurring at L5-S1. No neural compression seen throughout the lumbar spine. Mild borderline moderate spinal stenosis at L3-4. IMPRESSION: 1. No acute finding. 2. 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 Up to mild/moderate spinal stenosis at L3-4. Electronically Signed   By: Tiburcio Pea M.D.   On: 10/15/2023 04:09   DG Foot Complete Right  Result Date: 10/15/2023 CLINICAL DATA:  Wound on the right foot. EXAM: RIGHT FOOT COMPLETE - 3+ VIEW COMPARISON:  None Available. FINDINGS: Diffuse bone demineralization. Degenerative changes in the interphalangeal joints, first metatarsal-phalangeal joint, and intertarsal joints. No evidence of acute fracture or dislocation. No focal bone lesion or bone destruction. Bone cortex appears intact. No radiographic findings to suggest osteomyelitis. Soft tissues are unremarkable. No radiopaque soft tissue foreign bodies or soft tissue gas. IMPRESSION: Degenerative changes in the right foot. Diffuse bone demineralization. No acute bony abnormalities. Electronically Signed   By: Burman Nieves M.D.   On: 10/15/2023 03:16   DG Hip Unilat  With Pelvis 2-3 Views Right  Result Date: 10/14/2023 CLINICAL DATA:  Right hip pain.  Hip replacement 8 years ago. EXAM: DG HIP (WITH OR WITHOUT PELVIS) 2-3V RIGHT COMPARISON:  None Available. FINDINGS: Right hip arthroplasty in expected alignment. There is no periprosthetic fracture. No definite periprosthetic lucency. Bony pelvis is intact. Pubic symphysis and sacroiliac joints are congruent. IMPRESSION: Right hip  arthroplasty without complication. Electronically Signed   By: Narda Rutherford M.D.   On: 10/14/2023 19:57    EKG: I independently viewed the EKG done and my findings are as followed: None available at the time of this visit.  Assessment/Plan Present on Admission:  Cellulitis  Principal Problem:   Cellulitis  Right foot wound and cellulitis, POA No acute bony abnormalities seen on x-ray Follow blood cultures x 2 peripherally As needed analgesics Wound care specialist consulted Continue local wound care with wound care specialist guidance  Leukopenia WBC 3.7 Closely monitor and repeat CBC Monitor fever curve and WBCs  Chronic normocytic anemia Hemoglobin 11.1 with MCV of 95.  Physical debility PT OT assessment Fall precautions.  Alcohol use disorder Drinks 1-2 beers per day No evidence of acute alcohol withdrawal at this time  Elevated lactic acid Lactic acid 2.3, trend Gentle IV fluid hydration NS at 40 cc/h x 1 day   Time: 75 minutes.   DVT prophylaxis: Subcu Lovenox daily. Code Status: Full code.  Family Communication: None at bedside.  Disposition Plan: Admitted to MedSurg  unit.  Consults called: None.  Admission status: Inpatient status.   Status is: Inpatient The patient requires at least 2 midnights for further evaluation and treatment of present condition.   Darlin Drop MD [facility] Pager [phone]  If 7PM-7AM, please contact night-coverage [email or web address] Password [email or web address]  10/15/2023, 5:02 AM

## 2023-10-15 NOTE — ED Notes (Signed)
ED TO INPATIENT HANDOFF REPORT  ED Nurse Name and Phone #: Jayvan Mcshan [phone]   S Name/Age/Gender Sean Mccoy 62 y.o. male Room/Bed: 037C/037C  Code Status   Code Status: Full Code  Home/SNF/Other Home Patient oriented to: self, place, time, and situation Is this baseline? Yes   Triage Complete: Triage complete  Chief Complaint Cellulitis [L03.90]  Triage Note Presents from home for hip pain in same hip that he had a hip replacement 8 years ago.  Endorses pain and cramping in R hip, stiffness, decreased ROM that started 2 weeks ago after hearing a "popping sound." No falls or obvious trauma.  He takes oxycodone for other herniated disc in back, arthritis in knees.  Pt would also like staff to know he has an ulcer on R foot that has been present for a year. He is being treated for this by PCP currently, last visit 2 weeks ago. Pt declines for RN to visualize wound in triage "I'll just wait for the doctor. I just changed my bandage at home"   Allergies No Known Allergies  Level of Care/Admitting Diagnosis ED Disposition     ED Disposition  Admit   Condition  --   Comment  Hospital Area: [facility] [[id number]]  Level of Care: Med-Surg [16]  May admit patient to [facility] or [facility] if equivalent level of care is available:: Yes  Covid Evaluation: Asymptomatic - no recent exposure (last 10 days) testing not required  Diagnosis: Cellulitis [[id number]]  Admitting Physician: Darlin Drop [[id number]]  Attending Physician: Darlin Drop [[id number]]  Certification:: I certify this patient will need inpatient services for at least 2 midnights  Expected Medical Readiness: 10/17/2023          B Medical/Surgery History Past Medical History:  Diagnosis Date   Abrasion of face 11/06/2018   Anxiety    COVID-19    Depression    Enlarged prostate    HTN (hypertension) 05/04/2019   Orbital floor fracture (HCC) 11/06/2018   right   PTSD (post-traumatic stress  disorder)    Rheumatoid arthritis (HCC)    Past Surgical History:  Procedure Laterality Date   KNEE ARTHROSCOPY Right    ORIF ORBITAL FRACTURE Right 11/22/2018   Procedure: OPEN REDUCTION INTERNAL FIXATION (ORIF) RIGHT TRIPOD FRACTURE;  Surgeon: Peggye Form, DO;  Location: [facility];  Service: Plastics;  Laterality: Right;   TOTAL HIP ARTHROPLASTY Right 01/20/2016   Procedure: RIGHT TOTAL HIP ARTHROPLASTY ANTERIOR APPROACH;  Surgeon: Kathryne Hitch, MD;  Location: [facility] OR;  Service: Orthopedics;  Laterality: Right;     A IV Location/Drains/Wounds Patient Lines/Drains/Airways Status     Active Line/Drains/Airways     Name Placement date Placement time Site Days   Peripheral IV 10/15/23 20 G Left Antecubital 10/15/23  0424  Antecubital   less than 1   Wound / Incision (Open or Dehisced) 11/09/18 Laceration Face Right;Upper Upper eyebrow area 11/09/18  0730  Face  1801            Intake/Output Last 24 hours  Intake/Output Summary (Last 24 hours) at 10/15/2023 1438 Last data filed at 10/15/2023 0514 Gross per 24 hour  Intake 100 ml  Output --  Net 100 ml    Labs/Imaging Results for orders placed or performed during the hospital encounter of 10/14/23 (from the past 48 hour(s))  CBC with Differential     Status: Abnormal   Collection Time: 10/15/23  3:14 AM  Result Value Ref  Range   WBC 3.7 (L) 4.0 - 10.5 K/uL   RBC 3.59 (L) 4.22 - 5.81 MIL/uL   Hemoglobin 11.1 (L) 13.0 - 17.0 g/dL   HCT 16.1 (L) 09.6 - 04.5 %   MCV 95.3 80.0 - 100.0 fL   MCH 30.9 26.0 - 34.0 pg   MCHC 32.5 30.0 - 36.0 g/dL   RDW 40.9 81.1 - 91.4 %   Platelets 200 150 - 400 K/uL   nRBC 0.0 0.0 - 0.2 %   Neutrophils Relative % 42 %   Neutro Abs 1.5 (L) 1.7 - 7.7 K/uL   Lymphocytes Relative 37 %   Lymphs Abs 1.4 0.7 - 4.0 K/uL   Monocytes Relative 18 %   Monocytes Absolute 0.7 0.1 - 1.0 K/uL   Eosinophils Relative 2 %   Eosinophils Absolute 0.1 0.0 - 0.5 K/uL    Basophils Relative 1 %   Basophils Absolute 0.0 0.0 - 0.1 K/uL   Immature Granulocytes 0 %   Abs Immature Granulocytes 0.01 0.00 - 0.07 K/uL    Comment: Performed at [facility], [address] [place], [place] [address]  Comprehensive metabolic panel     Status: Abnormal   Collection Time: 10/15/23  3:14 AM  Result Value Ref Range   Sodium 139 135 - 145 mmol/L   Potassium 4.0 3.5 - 5.1 mmol/L   Chloride 99 98 - 111 mmol/L   CO2 23 22 - 32 mmol/L   Glucose, Bld 87 70 - 99 mg/dL    Comment: Glucose reference range applies only to samples taken after fasting for at least 8 hours.   BUN 7 (L) 8 - 23 mg/dL   Creatinine, Ser 6.21 0.61 - 1.24 mg/dL   Calcium 30.8 8.9 - 65.7 mg/dL   Total Protein 7.7 6.5 - 8.1 g/dL   Albumin 3.5 3.5 - 5.0 g/dL   AST 29 15 - 41 U/L   ALT 16 0 - 44 U/L   Alkaline Phosphatase 97 38 - 126 U/L   Total Bilirubin 0.6 0.3 - 1.2 mg/dL   GFR, Estimated >84 >69 mL/min    Comment: (NOTE) Calculated using the CKD-EPI Creatinine Equation (2021)    Anion gap 17 (H) 5 - 15    Comment: Performed at [facility], [address], [place], [place] [address]  Lactic acid, plasma     Status: Abnormal   Collection Time: 10/15/23  3:14 AM  Result Value Ref Range   Lactic Acid, Venous 2.3 (HH) 0.5 - 1.9 mmol/L    Comment: CRITICAL RESULT CALLED TO, READ BACK BY AND VERIFIED WITH Roselyn Reef RN [phone] M.[facility] Performed at [facility], [address] [place], [place] [address]   Sedimentation rate     Status: Abnormal   Collection Time: 10/15/23  3:14 AM  Result Value Ref Range   Sed Rate 40 (H) 0 - 16 mm/hr    Comment: Performed at [facility], [address], [place], [place] [address]  C-reactive protein     Status: None   Collection Time: 10/15/23  3:14 AM  Result Value Ref Range   CRP 0.7 <1.0 mg/dL    Comment: Performed at [facility], [address] [place], [place] [address]  Lactic acid, plasma     Status: Abnormal    Collection Time: 10/15/23  5:34 AM  Result Value Ref Range   Lactic Acid, Venous 2.1 (HH) 0.5 - 1.9 mmol/L  Comment: CRITICAL RESULT CALLED TO, READ BACK BY AND VERIFIED WITH Elroy Channel RN [phone] M. [facility] Performed at [facility], [address] [place], [place] [address]    CT Hip Right Wo Contrast  Result Date: 10/15/2023 CLINICAL DATA:  Right hip pain.  Stress fracture suspected. EXAM: CT OF THE RIGHT HIP WITHOUT CONTRAST TECHNIQUE: Multidetector CT imaging of the right hip was performed according to the standard protocol. Multiplanar CT image reconstructions were also generated. RADIATION DOSE REDUCTION: This exam was performed according to the departmental dose-optimization program which includes automated exposure control, adjustment of the mA and/or kV according to patient size and/or use of iterative reconstruction technique. COMPARISON:  AP pelvis and AP and frog-leg right hip views yesterday, CT abdomen and pelvis and reconstructions 03/29/2020. FINDINGS: Bones/Joint/Cartilage The bone mineralization is normal. There is a right hip arthroplasty again noted. There are no overt findings of hardware loosening. No dislocation. There are no periprosthetic fractures. No primary pathologic process is seen. There is slight spurring of pubic symphysis, mild narrowing and spurring right SI joint. The visualized sacrum is intact. Chronic dystrophic calcifications are again noted anterior to the superior right acetabulum. Ligaments Suboptimally assessed by CT. Muscles and Tendons No mass or fluid collections are seen allowing for spray artifact from the metallic hardware. Area tendons are grossly intact. Soft tissues Chronic subcutaneous coarse stranding in the lateral right hip area. No superficial or deep soft tissue hematoma. Structures within the right hemipelvis are largely either obscured due to spray artifact from the hip hardware or collimated out of the field of view. The  prostate is normal in size and both testicles are in the scrotal sac. The right side of the bladder is unremarkable where visible. There is nonspecific increased prominence of right inguinal chain nodes. The largest of these is 1.4 cm in short axis with a slightly prominent right external iliac chain node 1 cm short axis also new. IMPRESSION: 1. Right hip arthroplasty with no overt findings of hardware loosening or periprosthetic fractures. No acute bony abnormalities are seen. 2. Chronic subcutaneous coarse stranding in the lateral right hip area. No superficial or deep soft tissue hematoma. 3. Nonspecific increased prominence of right inguinal chain and right external iliac chain nodes. 4. Limited visualization of the right hemipelvis. No mass or fluid collections as far as seen. Electronically Signed   By: Almira Bar M.D.   On: 10/15/2023 04:38   CT Lumbar Spine Wo Contrast  Result Date: 10/15/2023 CLINICAL DATA:  Acute lumbar myelopathy EXAM: CT LUMBAR SPINE WITHOUT CONTRAST TECHNIQUE: Multidetector CT imaging of the lumbar spine was performed without intravenous contrast administration. Multiplanar CT image reconstructions were also generated. RADIATION DOSE REDUCTION: This exam was performed according to the departmental dose-optimization program which includes automated exposure control, adjustment of the mA and/or kV according to patient size and/or use of iterative reconstruction technique. COMPARISON:  Lumbar radiography 07/04/2023.  Abdominal CT 03/29/2020 FINDINGS: Segmentation: 5 lumbar type vertebrae. Alignment: Normal. Vertebrae: No acute fracture or focal pathologic process. Paraspinal and other soft tissues: Negative. Disc levels: Congenitally narrow lumbar spinal canal. Disc narrowing and bulging that is mild except at L5-S1 where there is disc collapse and gas containing fissure. Mild facet spurring at L5-S1. No neural compression seen throughout the lumbar spine. Mild borderline moderate  spinal stenosis at L3-4. IMPRESSION: 1. No acute finding. 2. 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 Up to mild/moderate  spinal stenosis at L3-4. Electronically Signed   By: Tiburcio Pea M.D.   On: 10/15/2023 04:09   DG Foot Complete Right  Result Date: 10/15/2023 CLINICAL DATA:  Wound on the right foot. EXAM: RIGHT FOOT COMPLETE - 3+ VIEW COMPARISON:  None Available. FINDINGS: Diffuse bone demineralization. Degenerative changes in the interphalangeal joints, first metatarsal-phalangeal joint, and intertarsal joints. No evidence of acute fracture or dislocation. No focal bone lesion or bone destruction. Bone cortex appears intact. No radiographic findings to suggest osteomyelitis. Soft tissues are unremarkable. No radiopaque soft tissue foreign bodies or soft tissue gas. IMPRESSION: Degenerative changes in the right foot. Diffuse bone demineralization. No acute bony abnormalities. Electronically Signed   By: Burman Nieves M.D.   On: 10/15/2023 03:16   DG Hip Unilat  With Pelvis 2-3 Views Right  Result Date: 10/14/2023 CLINICAL DATA:  Right hip pain.  Hip replacement 8 years ago. EXAM: DG HIP (WITH OR WITHOUT PELVIS) 2-3V RIGHT COMPARISON:  None Available. FINDINGS: Right hip arthroplasty in expected alignment. There is no periprosthetic fracture. No definite periprosthetic lucency. Bony pelvis is intact. Pubic symphysis and sacroiliac joints are congruent. IMPRESSION: Right hip arthroplasty without complication. Electronically Signed   By: Narda Rutherford M.D.   On: 10/14/2023 19:57    Pending Labs Unresulted Labs (From admission, onward)     Start     Ordered   10/22/23 0500  Creatinine, serum  (enoxaparin (LOVENOX)    CrCl >/= 30 ml/min)  Weekly,   R     Comments: while on enoxaparin therapy    10/15/23 0500   10/16/23 0500  HIV Antibody (routine testing w rflx)  (HIV Antibody (Routine testing w reflex) panel)  Tomorrow morning,    R        10/15/23 0521   10/16/23 0500  CBC  Tomorrow morning,   R        10/15/23 0521   10/16/23 0500  Basic metabolic panel  Tomorrow morning,   R        10/15/23 0521   10/15/23 0234  Blood culture (routine x 2)  BLOOD CULTURE X 2,   R (with STAT occurrences)      10/15/23 0233            Vitals/Pain Today's Vitals   10/15/23 1130 10/15/23 1155 10/15/23 1353 10/15/23 1436  BP: (!) 180/81 (!) 165/76  (!) 160/77  Pulse: 87 95  90  Resp: 19 (!) 22  18  Temp:   97.7 F (36.5 C)   TempSrc:   Oral   SpO2: 100% 100%  98%  Weight:      PainSc:        Isolation Precautions No active isolations  Medications Medications  enoxaparin (LOVENOX) injection 40 mg (40 mg Subcutaneous Given 10/15/23 0523)  acetaminophen (TYLENOL) tablet 650 mg (has no administration in time range)  prochlorperazine (COMPAZINE) injection 5 mg (has no administration in time range)  polyethylene glycol (MIRALAX / GLYCOLAX) packet 17 g (17 g Oral Given 10/15/23 0523)  melatonin tablet 5 mg (has no administration in time range)  oxyCODONE (Oxy IR/ROXICODONE) immediate release tablet 5 mg (5 mg Oral Given 10/15/23 0822)  HYDROmorphone (DILAUDID) injection 0.5 mg (0.5 mg Intravenous Given 10/15/23 1159)  0.9 %  sodium chloride infusion ( Intravenous New Bag/Given 10/15/23 0805)  ceFEPIme (MAXIPIME) 2 g in sodium chloride 0.9 % 100 mL IVPB (2 g Intravenous New Bag/Given 10/15/23 1423)  vancomycin (VANCOREADY) IVPB 1250 mg/250 mL (0 mg  Intravenous Stopped 10/15/23 0804)  hydrALAZINE (APRESOLINE) tablet 25 mg (has no administration in time range)  fentaNYL (SUBLIMAZE) injection 50 mcg (50 mcg Intravenous Given 10/15/23 0425)  ceFEPIme (MAXIPIME) 2 g in sodium chloride 0.9 % 100 mL IVPB (0 g Intravenous Stopped 10/15/23 0514)    Mobility walks     Focused Assessments Musculoskeletal    R Recommendations: See Admitting Provider Note  Report given to:   Additional Notes:

## 2023-10-15 NOTE — Plan of Care (Signed)

## 2023-10-15 NOTE — ED Provider Notes (Signed)
[place] EMERGENCY DEPARTMENT AT [facility] Provider Note   CSN: [id number] Arrival date & time: 10/14/23  1846     History  Chief Complaint  Patient presents with   Hip Pain    Sean Mccoy is a 62 y.o. male.  Patient with a history of PTSD, hypertension, rheumatoid arthritis, enlarged prostate presenting with 2 weeks of progressively worsening right hip pain.  He had this hip replaced about 7 years ago by [facility].  States over the past 2 weeks has been progressively worse to the point where he is unable to stand or bear weight and the pain is worse than before he had surgery.  Denies any new fall or trauma.  He states he heard a popping sound a few weeks ago but did not fall.  Pain is to his right anterior lateral hip and radiates down his right leg.  Also has a history of bulging disc in his back and arthritis in his knees for which he takes oxycodone but this is not helping his hip pain.  Denies any fevers, chills, nausea or vomiting.  No chest pain or shortness of breath.  No bowel or bladder incontinence.  No new weakness, numbness, tingling.  Also complains of wound to his right foot that has been there for "1 year".  He has been antibiotics on and off but cannot say the last time he took antibiotics.  States the wound is draining and foul-smelling.  He is not a diabetic. Does not think he had a fever.  He was taking care of the wound at home and is going to the wound clinic as well.  States there is so much pain in his leg that he cannot walk due to pain in the hip.  Denies any new change to his back pain.  No bowel or bladder incontinence, weakness, numbness or tingling.  The history is provided by the patient.  Hip Pain Pertinent negatives include no chest pain, no abdominal pain, no headaches and no shortness of breath.       Home Medications Prior to Admission medications   Medication Sig Start Date End Date Taking? Authorizing Provider   hydrocortisone 2.5 % lotion Apply topically 2 (two) times daily. 05/27/22   Charlynne Pander, MD  ketoconazole (NIZORAL) 2 % cream Apply 1 application. topically daily. 03/09/22   Arthor Captain, PA-C      Allergies    Patient has no known allergies.    Review of Systems   Review of Systems  Constitutional:  Negative for activity change, appetite change and fatigue.  HENT:  Negative for congestion and rhinorrhea.   Respiratory:  Negative for cough, chest tightness and shortness of breath.   Cardiovascular:  Positive for leg swelling. Negative for chest pain.  Gastrointestinal:  Negative for abdominal pain, nausea and vomiting.  Genitourinary:  Negative for dysuria and hematuria.  Musculoskeletal:  Positive for arthralgias and myalgias.  Skin:  Positive for wound. Negative for rash.  Neurological:  Negative for weakness and headaches.   all other systems are negative except as noted in the HPI and PMH.    Physical Exam Updated Vital Signs BP (!) 158/73 (BP Location: Left Arm)   Pulse 84   Temp 97.8 F (36.6 C)   Resp 16   Wt 72 kg   SpO2 100%   BMI 22.78 kg/m  Physical Exam Vitals and nursing note reviewed.  Constitutional:      General: He is not in acute  distress.    Appearance: He is well-developed.  HENT:     Head: Normocephalic and atraumatic.     Mouth/Throat:     Pharynx: No oropharyngeal exudate.  Eyes:     Conjunctiva/sclera: Conjunctivae normal.     Pupils: Pupils are equal, round, and reactive to light.  Neck:     Comments: No meningismus. Cardiovascular:     Rate and Rhythm: Normal rate and regular rhythm.     Heart sounds: Normal heart sounds. No murmur heard. Pulmonary:     Effort: Pulmonary effort is normal. No respiratory distress.     Breath sounds: Normal breath sounds.  Abdominal:     Palpations: Abdomen is soft.     Tenderness: There is no abdominal tenderness. There is no guarding or rebound.  Musculoskeletal:        General: Tenderness  present.     Cervical back: Normal range of motion and neck supple.     Comments: Uncomfortable, pain with range of motion of right hip.  Intact DP and PT pulse.  Foul-smelling wound to right dorsal foot with white discharge and eschar.  Skin:    General: Skin is warm.  Neurological:     Mental Status: He is alert and oriented to person, place, and time.     Cranial Nerves: No cranial nerve deficit.     Motor: No abnormal muscle tone.     Coordination: Coordination normal.     Comments:  5/5 strength throughout. CN 2-12 intact.Equal grip strength.   Psychiatric:        Behavior: Behavior normal.     ED Results / Procedures / Treatments   Labs (all labs ordered are listed, but only abnormal results are displayed) Labs Reviewed  CBC WITH DIFFERENTIAL/PLATELET - Abnormal; Notable for the following components:      Result Value   WBC 3.7 (*)    RBC 3.59 (*)    Hemoglobin 11.1 (*)    HCT 34.2 (*)    Neutro Abs 1.5 (*)    All other components within normal limits  COMPREHENSIVE METABOLIC PANEL - Abnormal; Notable for the following components:   BUN 7 (*)    Anion gap 17 (*)    All other components within normal limits  LACTIC ACID, PLASMA - Abnormal; Notable for the following components:   Lactic Acid, Venous 2.3 (*)    All other components within normal limits  CULTURE, BLOOD (ROUTINE X 2)  CULTURE, BLOOD (ROUTINE X 2)  C-REACTIVE PROTEIN  LACTIC ACID, PLASMA  SEDIMENTATION RATE  HIV ANTIBODY (ROUTINE TESTING W REFLEX)    EKG None  Radiology CT Hip Right Wo Contrast  Result Date: 10/15/2023 CLINICAL DATA:  Right hip pain.  Stress fracture suspected. EXAM: CT OF THE RIGHT HIP WITHOUT CONTRAST TECHNIQUE: Multidetector CT imaging of the right hip was performed according to the standard protocol. Multiplanar CT image reconstructions were also generated. RADIATION DOSE REDUCTION: This exam was performed according to the departmental dose-optimization program which includes  automated exposure control, adjustment of the mA and/or kV according to patient size and/or use of iterative reconstruction technique. COMPARISON:  AP pelvis and AP and frog-leg right hip views yesterday, CT abdomen and pelvis and reconstructions 03/29/2020. FINDINGS: Bones/Joint/Cartilage The bone mineralization is normal. There is a right hip arthroplasty again noted. There are no overt findings of hardware loosening. No dislocation. There are no periprosthetic fractures. No primary pathologic process is seen. There is slight spurring of pubic symphysis, mild narrowing and  spurring right SI joint. The visualized sacrum is intact. Chronic dystrophic calcifications are again noted anterior to the superior right acetabulum. Ligaments Suboptimally assessed by CT. Muscles and Tendons No mass or fluid collections are seen allowing for spray artifact from the metallic hardware. Area tendons are grossly intact. Soft tissues Chronic subcutaneous coarse stranding in the lateral right hip area. No superficial or deep soft tissue hematoma. Structures within the right hemipelvis are largely either obscured due to spray artifact from the hip hardware or collimated out of the field of view. The prostate is normal in size and both testicles are in the scrotal sac. The right side of the bladder is unremarkable where visible. There is nonspecific increased prominence of right inguinal chain nodes. The largest of these is 1.4 cm in short axis with a slightly prominent right external iliac chain node 1 cm short axis also new. IMPRESSION: 1. Right hip arthroplasty with no overt findings of hardware loosening or periprosthetic fractures. No acute bony abnormalities are seen. 2. Chronic subcutaneous coarse stranding in the lateral right hip area. No superficial or deep soft tissue hematoma. 3. Nonspecific increased prominence of right inguinal chain and right external iliac chain nodes. 4. Limited visualization of the right hemipelvis.  No mass or fluid collections as far as seen. Electronically Signed   By: Almira Bar M.D.   On: 10/15/2023 04:38   CT Lumbar Spine Wo Contrast  Result Date: 10/15/2023 CLINICAL DATA:  Acute lumbar myelopathy EXAM: CT LUMBAR SPINE WITHOUT CONTRAST TECHNIQUE: Multidetector CT imaging of the lumbar spine was performed without intravenous contrast administration. Multiplanar CT image reconstructions were also generated. RADIATION DOSE REDUCTION: This exam was performed according to the departmental dose-optimization program which includes automated exposure control, adjustment of the mA and/or kV according to patient size and/or use of iterative reconstruction technique. COMPARISON:  Lumbar radiography 07/04/2023.  Abdominal CT 03/29/2020 FINDINGS: Segmentation: 5 lumbar type vertebrae. Alignment: Normal. Vertebrae: No acute fracture or focal pathologic process. Paraspinal and other soft tissues: Negative. Disc levels: Congenitally narrow lumbar spinal canal. Disc narrowing and bulging that is mild except at L5-S1 where there is disc collapse and gas containing fissure. Mild facet spurring at L5-S1. No neural compression seen throughout the lumbar spine. Mild borderline moderate spinal stenosis at L3-4. IMPRESSION: 1. No acute finding. 2. 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 Up to mild/moderate spinal stenosis at L3-4. Electronically Signed   By: Tiburcio Pea M.D.   On: 10/15/2023 04:09   DG Foot Complete Right  Result Date: 10/15/2023 CLINICAL DATA:  Wound on the right foot. EXAM: RIGHT FOOT COMPLETE - 3+ VIEW COMPARISON:  None Available. FINDINGS: Diffuse bone demineralization. Degenerative changes in the interphalangeal joints, first metatarsal-phalangeal joint, and intertarsal joints. No evidence of acute fracture or dislocation. No focal bone lesion or bone destruction. Bone cortex appears intact. No radiographic findings to suggest  osteomyelitis. Soft tissues are unremarkable. No radiopaque soft tissue foreign bodies or soft tissue gas. IMPRESSION: Degenerative changes in the right foot. Diffuse bone demineralization. No acute bony abnormalities. Electronically Signed   By: Burman Nieves M.D.   On: 10/15/2023 03:16   DG Hip Unilat  With Pelvis 2-3 Views Right  Result Date: 10/14/2023 CLINICAL DATA:  Right hip pain.  Hip replacement 8 years ago. EXAM: DG HIP (WITH OR WITHOUT PELVIS) 2-3V RIGHT COMPARISON:  None Available. FINDINGS: Right hip arthroplasty in expected alignment. There is no periprosthetic fracture. No definite  periprosthetic lucency. Bony pelvis is intact. Pubic symphysis and sacroiliac joints are congruent. IMPRESSION: Right hip arthroplasty without complication. Electronically Signed   By: Narda Rutherford M.D.   On: 10/14/2023 19:57    Procedures Procedures    Medications Ordered in ED Medications - No data to display  ED Course/ Medical Decision Making/ A&P                                 Medical Decision Making Amount and/or Complexity of Data Reviewed Labs: ordered. Decision-making details documented in ED Course. Radiology: ordered and independent interpretation performed. Decision-making details documented in ED Course. ECG/medicine tests: ordered and independent interpretation performed. Decision-making details documented in ED Course.  Risk Prescription drug management. Decision regarding hospitalization.   2 weeks of progressively worsening hip pain without trauma.  History of hip replacement 7 years ago.  Vital stable, no distress.  Intact distal pulses.  Obtained in triage is negative for fracture or dislocation.  Results reviewed and interpreted by me.  Patient's foot wound is concerning for cellulitis.  Is foul-smelling.  X-ray is negative for changes of osteomyelitis.  No fever.  No significant leukocytosis.  Lactate slightly elevated.  X-rays negative for any bone destruction  or soft tissue gas.  X-ray and CT scan of right hip are negative for fracture or infection. Suspect his hip pain is likely musculoskeletal in origin.  If persists may require MRI.  Given patient's worsening ulceration to his foot with foul-smelling drainage and purulence we will plan admission for IV antibiotics.  Patient is agreeable.  Cultures are pending.  Admission discussed with Dr. Margo Aye        Final Clinical Impression(s) / ED Diagnoses Final diagnoses:  Cellulitis of foot  Right hip pain    Rx / DC Orders ED Discharge Orders     None         Glynn Octave, MD 10/15/23 [phone]

## 2023-10-16 DIAGNOSIS — L03031 Cellulitis of right toe: Secondary | ICD-10-CM | POA: Diagnosis not present

## 2023-10-16 LAB — HIV ANTIBODY (ROUTINE TESTING W REFLEX): HIV Screen 4th Generation wRfx: NONREACTIVE

## 2023-10-16 LAB — BASIC METABOLIC PANEL
Anion gap: 9 (ref 5–15)
BUN: 5 mg/dL — ABNORMAL LOW (ref 8–23)
CO2: 23 mmol/L (ref 22–32)
Calcium: 9.3 mg/dL (ref 8.9–10.3)
Chloride: 104 mmol/L (ref 98–111)
Creatinine, Ser: 0.73 mg/dL (ref 0.61–1.24)
GFR, Estimated: >60 mL/min (ref >60)
Glucose, Bld: 158 mg/dL — ABNORMAL HIGH (ref 70–99)
Potassium: 4 mmol/L (ref 3.5–5.1)
Sodium: 136 mmol/L (ref 135–145)

## 2023-10-16 LAB — CBC
HCT: 34.9 % — ABNORMAL LOW (ref 39.0–52.0)
Hemoglobin: 11.3 g/dL — ABNORMAL LOW (ref 13.0–17.0)
MCH: 30.5 pg (ref 26.0–34.0)
MCHC: 32.4 g/dL (ref 30.0–36.0)
MCV: 94.1 fL (ref 80.0–100.0)
Platelets: 203 10*3/uL (ref 150–400)
RBC: 3.71 MIL/uL — ABNORMAL LOW (ref 4.22–5.81)
RDW: 14.4 % (ref 11.5–15.5)
WBC: 3.7 10*3/uL — ABNORMAL LOW (ref 4.0–10.5)
nRBC: 0 % (ref 0.0–0.2)

## 2023-10-16 LAB — SEDIMENTATION RATE: Sed Rate: 24 mm/h — ABNORMAL HIGH (ref 0–16)

## 2023-10-16 LAB — C-REACTIVE PROTEIN: CRP: 0.5 mg/dL (ref <1.0)

## 2023-10-16 MED ORDER — OXYCODONE HCL 5 MG PO TABS
5.0000 mg | ORAL_TABLET | ORAL | Status: DC | PRN
  Administered 2023-10-16 – 2023-10-19 (×17): 5 mg via ORAL
  Filled 2023-10-16 (×18): qty 1

## 2023-10-16 NOTE — Progress Notes (Signed)
PT Cancellation Note  Patient Details Name: Sean Mccoy MRN: [id number] DOB: 11-23-1961   Cancelled Treatment:    Reason Eval/Treat Not Completed: Patient declined, no reason specified - pt eating, PT to come back at a later time   Marye Round, PT DPT Acute Rehabilitation Services Secure Chat Preferred  Office [phone]     Truddie Coco 10/16/2023, 2:37 PM

## 2023-10-16 NOTE — Progress Notes (Signed)
PROGRESS NOTE    Sean Mccoy  WUJ:[id number] DOB: [birth year] DOA: 10/14/2023 PCP: [name], FNP  Outpatient Specialists:     Brief Narrative:  Patient is a 62 year old male, noncompliant, past medical history significant for alcohol use and status post right hip surgery about 7 years ago.  Patient presents with worsening right hip pain and right foot infection.  CT scan of the hip was nonrevealing.  Wound care input is highly appreciated.  Apparently, patient is known to Dr. Magnus Ivan, local orthopedic surgeon.  Communicated with Dr. Lajoyce Corners (covering orthopedic surgeon).  Dr. Lajoyce Corners will see patient tomorrow in consultation.  MRI and ABI planned by Dr. Lajoyce Corners.  Patient is currently on IV vancomycin and cefepime.  Blood cultures have not grown any organisms.  No wound culture visualized.  Will proceed with culture.  Labs reviewed today (10/16/2023): Chemistry reveals sodium of 136, potassium of 4, chloride 104, CO2 of 23, BUN of 5, serum creatinine of 0.73 with GFR of greater than 60.  CBC revealed WBC of 3.7, hemoglobin of 11.3, hematocrit of 34.9, platelet count of 203.   Assessment & Plan:   Principal Problem:   Cellulitis Active Problems:   Right foot infection   Right foot wound and cellulitis, POA No acute bony abnormalities seen on x-ray Orthopedic team consulted. Negative blood cultures. Proceed with wound culture. Continue IV vancomycin and cefepime. Continue local wound dressing.   Leukopenia WBC 3.7 Monitor closely.     Chronic normocytic anemia Hemoglobin 11.1 with MCV of 95. Consider taking vitamin B12 and folate on outpatient basis.   Physical debility PT OT assessment Fall precautions.   Alcohol use disorder Drinks 1-2 beers per day No evidence of acute alcohol withdrawal at this time   Elevated lactic acid Lactic acid 2.3, trending downwards.  Right hip pain: -History of right hip surgery about 7 years ago. -Patient is known to Dr. Magnus Ivan, with  orthopedic surgery. -CT scan of the right hip was nonrevealing. -Further management as per orthopedic team. -Adequate analgesia.  DVT prophylaxis:  Code Status: Full code. Family Communication:  Disposition Plan:    Consultants:  Orthopedic surgery.  Procedures:  None.  Antimicrobials:  IV vancomycin. IV cefepime   Subjective: Continues to report right hip pain. No fever or chills. No shortness of breath or chest pain.  Objective: Vitals:   10/15/23 1538 10/15/23 2003 10/16/23 0456 10/16/23 0858  BP: (!) 164/69 (!) 176/78 (!) 181/84 (!) 174/82  Pulse: 87 81 85 72  Resp: 20 18 18 17   Temp: 98.5 F (36.9 C) 98.4 F (36.9 C) 98.3 F (36.8 C) 99 F (37.2 C)  TempSrc: Oral Oral Oral Oral  SpO2: 98% 99% 100% 100%  Weight:        Intake/Output Summary (Last 24 hours) at 10/16/2023 1107 Last data filed at 10/16/2023 0943 Gross per 24 hour  Intake 1395.87 ml  Output 401 ml  Net 994.87 ml   Filed Weights   10/14/23 1903  Weight: 72 kg    Examination:  General exam: Appears calm and comfortable. Respiratory system: Clear to auscultation.  Cardiovascular system: S1 & S2 heard Gastrointestinal system: Abdomen is soft and nontender.   Central nervous system: Awake and alert.  Patient moves all extremities.   Extremities: Right foot and toes are swollen.  Right forefoot infection, chronic.  Data Reviewed: I have personally reviewed following labs and imaging studies  CBC: Recent Labs  Lab 10/15/23 0314 10/16/23 0711  WBC 3.7* 3.7*  NEUTROABS 1.5*  --  HGB 11.1* 11.3*  HCT 34.2* 34.9*  MCV 95.3 94.1  PLT 200 203   Basic Metabolic Panel: Recent Labs  Lab 10/15/23 0314 10/16/23 0711  NA 139 136  K 4.0 4.0  CL 99 104  CO2 23 23  GLUCOSE 87 158*  BUN 7* 5*  CREATININE 0.67 0.73  CALCIUM 10.2 9.3   GFR: Estimated Creatinine Clearance: 98.8 mL/min (by C-G formula based on SCr of 0.73 mg/dL). Liver Function Tests: Recent Labs  Lab  10/15/23 0314  AST 29  ALT 16  ALKPHOS 97  BILITOT 0.6  PROT 7.7  ALBUMIN 3.5   No results for input(s): "LIPASE", "AMYLASE" in the last 168 hours. No results for input(s): "AMMONIA" in the last 168 hours. Coagulation Profile: No results for input(s): "INR", "PROTIME" in the last 168 hours. Cardiac Enzymes: No results for input(s): "CKTOTAL", "CKMB", "CKMBINDEX", "TROPONINI" in the last 168 hours. BNP (last 3 results) No results for input(s): "PROBNP" in the last 8760 hours. HbA1C: No results for input(s): "HGBA1C" in the last 72 hours. CBG: No results for input(s): "GLUCAP" in the last 168 hours. Lipid Profile: No results for input(s): "CHOL", "HDL", "LDLCALC", "TRIG", "CHOLHDL", "LDLDIRECT" in the last 72 hours. Thyroid Function Tests: No results for input(s): "TSH", "T4TOTAL", "FREET4", "T3FREE", "THYROIDAB" in the last 72 hours. Anemia Panel: No results for input(s): "VITAMINB12", "FOLATE", "FERRITIN", "TIBC", "IRON", "RETICCTPCT" in the last 72 hours. Urine analysis:    Component Value Date/Time   COLORURINE AMBER (A) 03/29/2020 1115   APPEARANCEUR CLEAR 03/29/2020 1115   LABSPEC 1.012 03/29/2020 1115   PHURINE 5.0 03/29/2020 1115   GLUCOSEU NEGATIVE 03/29/2020 1115   HGBUR NEGATIVE 03/29/2020 1115   BILIRUBINUR NEGATIVE 03/29/2020 1115   KETONESUR NEGATIVE 03/29/2020 1115   PROTEINUR NEGATIVE 03/29/2020 1115   UROBILINOGEN 0.2 08/14/2012 1057   NITRITE NEGATIVE 03/29/2020 1115   LEUKOCYTESUR NEGATIVE 03/29/2020 1115   Sepsis Labs: @LABRCNTIP (procalcitonin:4,lacticidven:4)  ) Recent Results (from the past 240 hour(s))  Blood culture (routine x 2)     Status: None (Preliminary result)   Collection Time: 10/15/23  3:14 AM   Specimen: BLOOD  Result Value Ref Range Status   Specimen Description BLOOD BLOOD LEFT ARM  Final   Special Requests   Final    BOTTLES DRAWN AEROBIC AND ANAEROBIC Blood Culture adequate volume   Culture   Final    NO GROWTH 1  DAY Performed at [facility], [address] [place], [place] [address]    Report Status PENDING  Incomplete  Blood culture (routine x 2)     Status: None (Preliminary result)   Collection Time: 10/15/23  4:34 AM   Specimen: BLOOD RIGHT ARM  Result Value Ref Range Status   Specimen Description BLOOD RIGHT ARM  Final   Special Requests   Final    BOTTLES DRAWN AEROBIC AND ANAEROBIC Blood Culture adequate volume   Culture   Final    NO GROWTH 1 DAY Performed at [facility], [address], [place], [place] [address]    Report Status PENDING  Incomplete         Radiology Studies: CT Hip Right Wo Contrast  Result Date: 10/15/2023 CLINICAL DATA:  Right hip pain.  Stress fracture suspected. EXAM: CT OF THE RIGHT HIP WITHOUT CONTRAST TECHNIQUE: Multidetector CT imaging of the right hip was performed according to the standard protocol. Multiplanar CT image reconstructions were also generated. RADIATION DOSE REDUCTION: This exam was performed according to the departmental  dose-optimization program which includes automated exposure control, adjustment of the mA and/or kV according to patient size and/or use of iterative reconstruction technique. COMPARISON:  AP pelvis and AP and frog-leg right hip views yesterday, CT abdomen and pelvis and reconstructions 03/29/2020. FINDINGS: Bones/Joint/Cartilage The bone mineralization is normal. There is a right hip arthroplasty again noted. There are no overt findings of hardware loosening. No dislocation. There are no periprosthetic fractures. No primary pathologic process is seen. There is slight spurring of pubic symphysis, mild narrowing and spurring right SI joint. The visualized sacrum is intact. Chronic dystrophic calcifications are again noted anterior to the superior right acetabulum. Ligaments Suboptimally assessed by CT. Muscles and Tendons No mass or fluid collections are seen allowing for spray artifact from the metallic hardware.  Area tendons are grossly intact. Soft tissues Chronic subcutaneous coarse stranding in the lateral right hip area. No superficial or deep soft tissue hematoma. Structures within the right hemipelvis are largely either obscured due to spray artifact from the hip hardware or collimated out of the field of view. The prostate is normal in size and both testicles are in the scrotal sac. The right side of the bladder is unremarkable where visible. There is nonspecific increased prominence of right inguinal chain nodes. The largest of these is 1.4 cm in short axis with a slightly prominent right external iliac chain node 1 cm short axis also new. IMPRESSION: 1. Right hip arthroplasty with no overt findings of hardware loosening or periprosthetic fractures. No acute bony abnormalities are seen. 2. Chronic subcutaneous coarse stranding in the lateral right hip area. No superficial or deep soft tissue hematoma. 3. Nonspecific increased prominence of right inguinal chain and right external iliac chain nodes. 4. Limited visualization of the right hemipelvis. No mass or fluid collections as far as seen. Electronically Signed   By: Almira Bar M.D.   On: 10/15/2023 04:38   CT Lumbar Spine Wo Contrast  Result Date: 10/15/2023 CLINICAL DATA:  Acute lumbar myelopathy EXAM: CT LUMBAR SPINE WITHOUT CONTRAST TECHNIQUE: Multidetector CT imaging of the lumbar spine was performed without intravenous contrast administration. Multiplanar CT image reconstructions were also generated. RADIATION DOSE REDUCTION: This exam was performed according to the departmental dose-optimization program which includes automated exposure control, adjustment of the mA and/or kV according to patient size and/or use of iterative reconstruction technique. COMPARISON:  Lumbar radiography 07/04/2023.  Abdominal CT 03/29/2020 FINDINGS: Segmentation: 5 lumbar type vertebrae. Alignment: Normal. Vertebrae: No acute fracture or focal pathologic process.  Paraspinal and other soft tissues: Negative. Disc levels: Congenitally narrow lumbar spinal canal. Disc narrowing and bulging that is mild except at L5-S1 where there is disc collapse and gas containing fissure. Mild facet spurring at L5-S1. No neural compression seen throughout the lumbar spine. Mild borderline moderate spinal stenosis at L3-4. IMPRESSION: 1. No acute finding. 2. 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 Up to mild/moderate spinal stenosis at L3-4. Electronically Signed   By: Tiburcio Pea M.D.   On: 10/15/2023 04:09   DG Foot Complete Right  Result Date: 10/15/2023 CLINICAL DATA:  Wound on the right foot. EXAM: RIGHT FOOT COMPLETE - 3+ VIEW COMPARISON:  None Available. FINDINGS: Diffuse bone demineralization. Degenerative changes in the interphalangeal joints, first metatarsal-phalangeal joint, and intertarsal joints. No evidence of acute fracture or dislocation. No focal bone lesion or bone destruction. Bone cortex appears intact. No radiographic findings to suggest osteomyelitis. Soft tissues are unremarkable. No radiopaque soft tissue  foreign bodies or soft tissue gas. IMPRESSION: Degenerative changes in the right foot. Diffuse bone demineralization. No acute bony abnormalities. Electronically Signed   By: Burman Nieves M.D.   On: 10/15/2023 03:16   DG Hip Unilat  With Pelvis 2-3 Views Right  Result Date: 10/14/2023 CLINICAL DATA:  Right hip pain.  Hip replacement 8 years ago. EXAM: DG HIP (WITH OR WITHOUT PELVIS) 2-3V RIGHT COMPARISON:  None Available. FINDINGS: Right hip arthroplasty in expected alignment. There is no periprosthetic fracture. No definite periprosthetic lucency. Bony pelvis is intact. Pubic symphysis and sacroiliac joints are congruent. IMPRESSION: Right hip arthroplasty without complication. Electronically Signed   By: Narda Rutherford M.D.   On: 10/14/2023 19:57        Scheduled Meds:  enoxaparin  (LOVENOX) injection  40 mg Subcutaneous Q24H   Continuous Infusions:  ceFEPime (MAXIPIME) IV 2 g (10/16/23 0507)   vancomycin 1,250 mg (10/16/23 0618)     LOS: 1 day    Time spent: 35 minutes.    Berton Mount, MD  [facility] Pager #: [phone] 7PM-7AM contact night coverage as above

## 2023-10-17 ENCOUNTER — Inpatient Hospital Stay (HOSPITAL_COMMUNITY): Payer: 59

## 2023-10-17 DIAGNOSIS — L03031 Cellulitis of right toe: Secondary | ICD-10-CM | POA: Diagnosis not present

## 2023-10-17 DIAGNOSIS — L02611 Cutaneous abscess of right foot: Secondary | ICD-10-CM | POA: Diagnosis not present

## 2023-10-17 DIAGNOSIS — L03119 Cellulitis of unspecified part of limb: Secondary | ICD-10-CM | POA: Diagnosis not present

## 2023-10-17 LAB — HEMOGLOBIN A1C
Hgb A1c MFr Bld: 5.2 % (ref 4.8–5.6)
Mean Plasma Glucose: 102.54 mg/dL

## 2023-10-17 LAB — IRON AND TIBC
Iron: 59 ug/dL (ref 45–182)
Saturation Ratios: 14 % — ABNORMAL LOW (ref 17.9–39.5)
TIBC: 419 ug/dL (ref 250–450)
UIBC: 360 ug/dL

## 2023-10-17 LAB — FERRITIN: Ferritin: 203 ng/mL (ref 24–336)

## 2023-10-17 LAB — FOLATE: Folate: 18.1 ng/mL (ref >5.9)

## 2023-10-17 LAB — VANCOMYCIN, PEAK: Vancomycin Pk: 6 ug/mL — ABNORMAL LOW (ref 30–40)

## 2023-10-17 LAB — RETICULOCYTES
Immature Retic Fract: 10.6 % (ref 2.3–15.9)
RBC.: 3.95 MIL/uL — ABNORMAL LOW (ref 4.22–5.81)
Retic Count, Absolute: 52.1 10*3/uL (ref 19.0–186.0)
Retic Ct Pct: 1.3 % (ref 0.4–3.1)

## 2023-10-17 LAB — VANCOMYCIN, TROUGH: Vancomycin Tr: 17 ug/mL (ref 15–20)

## 2023-10-17 LAB — VITAMIN B12: Vitamin B-12: 347 pg/mL (ref 180–914)

## 2023-10-17 MED ORDER — LISINOPRIL 20 MG PO TABS
20.0000 mg | ORAL_TABLET | Freq: Every day | ORAL | Status: DC
  Administered 2023-10-17 – 2023-10-20 (×4): 20 mg via ORAL
  Filled 2023-10-17 (×4): qty 1

## 2023-10-17 MED ORDER — ADULT MULTIVITAMIN W/MINERALS CH
1.0000 | ORAL_TABLET | Freq: Every day | ORAL | Status: DC
  Administered 2023-10-17 – 2023-10-20 (×4): 1 via ORAL
  Filled 2023-10-17 (×4): qty 1

## 2023-10-17 MED ORDER — FOLIC ACID 1 MG PO TABS
1.0000 mg | ORAL_TABLET | Freq: Every day | ORAL | Status: DC
Start: 2023-10-17 — End: 2023-10-20
  Administered 2023-10-17 – 2023-10-20 (×4): 1 mg via ORAL
  Filled 2023-10-17 (×4): qty 1

## 2023-10-17 MED ORDER — LORAZEPAM 1 MG PO TABS: 1.0000 mg | ORAL_TABLET | ORAL | Status: DC | PRN

## 2023-10-17 MED ORDER — THIAMINE HCL 100 MG/ML IJ SOLN
100.0000 mg | Freq: Every day | INTRAMUSCULAR | Status: DC
  Filled 2023-10-17: qty 2

## 2023-10-17 MED ORDER — LISINOPRIL-HYDROCHLOROTHIAZIDE 20-25 MG PO TABS: 1.0000 | ORAL_TABLET | Freq: Every day | ORAL | Status: DC

## 2023-10-17 MED ORDER — HYDROCHLOROTHIAZIDE 25 MG PO TABS
25.0000 mg | ORAL_TABLET | Freq: Every day | ORAL | Status: DC
  Administered 2023-10-17 – 2023-10-20 (×4): 25 mg via ORAL
  Filled 2023-10-17 (×4): qty 1

## 2023-10-17 MED ORDER — THIAMINE MONONITRATE 100 MG PO TABS
100.0000 mg | ORAL_TABLET | Freq: Every day | ORAL | Status: DC
  Administered 2023-10-17 – 2023-10-20 (×4): 100 mg via ORAL
  Filled 2023-10-17 (×4): qty 1

## 2023-10-17 MED ORDER — HYDROMORPHONE HCL 1 MG/ML IJ SOLN
0.5000 mg | INTRAMUSCULAR | Status: DC | PRN
  Administered 2023-10-17 – 2023-10-19 (×11): 0.5 mg via INTRAVENOUS
  Filled 2023-10-17 (×11): qty 0.5

## 2023-10-17 NOTE — Progress Notes (Signed)
PROGRESS NOTE    Sean Mccoy  OZH:[id number] DOB: 06-Oct-1961 DOA: 10/14/2023 PCP: [name], FNP   Brief Narrative: 62 year old noncompliant, past medical history significant for alcohol use, status post right hip surgery 7 years ago presents with worsening right hip pain and right foot infection.  CT of the right hip was nonrevealing.  MRI and ABI has been ordered.  Orthopedic has been consulted, Dr. Lajoyce Corners will see patient in consultation.     Assessment & Plan:   Principal Problem:   Cellulitis Active Problems:   Right foot infection   1-Right foot wound and cellulitis present on admission: Present with worsening right foot infection.   Orthopedic consulted, Dr. Lajoyce Corners Continue with vancomycin and cefepime MRI of the right foot ordered. ESR 24, CRP 0.5 Dr Lajoyce Corners consulted.  IV dilaudid/oral oxy PRN for pain management  Leukopenia: -check anemia panel.    Chronic normocytic anemia: Anemia panel.  Monitor hb.   Physical debility: He will need PT eval.   Alcohol use disorder: Monitor on CIWA>   Lactic acidosis: In setting infection.  IV antibiotics.   Right hip pain: CT Hip:  Right hip arthroplasty with no overt findings of hardware loosening or periprosthetic fractures. No acute bony abnormalities are seen. Chronic subcutaneous coarse stranding in the lateral right hip area. No superficial or deep soft tissue hematoma. -pain management.   Hypertension: Resume hydrochlorothiazide, lisinopril.  Monitor renal function.       See wound care documentation below.   Pressure Injury 10/15/23 Foot Anterior;Right (Active)  10/15/23 1558  Location: Foot  Location Orientation: Anterior;Right  Staging:   Wound Description (Comments):   Present on Admission:   Dressing Type Other (Comment);Gauze (Comment) 10/16/23 2100        Estimated body mass index is 22.78 kg/m as calculated from the following:   Height as of 08/02/23: 5\' 10"  (1.778 m).   Weight as of  this encounter: 72 kg.   DVT prophylaxis: Lovenox Code Status: Full code Family Communication: Care discussed with patient.  Disposition Plan:  Status is: Inpatient Remains inpatient appropriate because: management of foot infection    Consultants:  Dr. Lajoyce Corners  Procedures:  MRI  Antimicrobials:    Subjective: He report pain right foot/ edema redness  Objective: Vitals:   10/16/23 1617 10/16/23 2246 10/17/23 0132 10/17/23 0525  BP: (!) 176/76 (!) 165/86 (!) 168/82 (!) 184/88  Pulse: 82 75 75 75  Resp: 17 17 17 17   Temp: 97.7 F (36.5 C) 98.2 F (36.8 C) 98 F (36.7 C) 98.9 F (37.2 C)  TempSrc:  Oral Oral Oral  SpO2: 98% 99% 100% 100%  Weight:        Intake/Output Summary (Last 24 hours) at 10/17/2023 0734 Last data filed at 10/17/2023 0500 Gross per 24 hour  Intake --  Output 1525 ml  Net -1525 ml   Filed Weights   10/14/23 1903  Weight: 72 kg    Examination:  General exam: Appears calm and comfortable  Respiratory system: Clear to auscultation. Respiratory effort normal. Cardiovascular system: S1 & S2 heard, RRR. No JVD, murmurs, rubs, gallops or clicks. No pedal edema. Gastrointestinal system: Abdomen is nondistended, soft and nontender. No organomegaly or masses felt. Normal bowel sounds heard. Central nervous system: Alert and oriented. No focal neurological deficits. Extremities: Right foot LE with edema redness,  hyperpigmentation.   Data Reviewed: I have personally reviewed following labs and imaging studies  CBC: Recent Labs  Lab 10/15/23 0314 10/16/23 0711  WBC  3.7* 3.7*  NEUTROABS 1.5*  --   HGB 11.1* 11.3*  HCT 34.2* 34.9*  MCV 95.3 94.1  PLT 200 203   Basic Metabolic Panel: Recent Labs  Lab 10/15/23 0314 10/16/23 0711  NA 139 136  K 4.0 4.0  CL 99 104  CO2 23 23  GLUCOSE 87 158*  BUN 7* 5*  CREATININE 0.67 0.73  CALCIUM 10.2 9.3   GFR: Estimated Creatinine Clearance: 98.8 mL/min (by C-G formula based on SCr of 0.73  mg/dL). Liver Function Tests: Recent Labs  Lab 10/15/23 0314  AST 29  ALT 16  ALKPHOS 97  BILITOT 0.6  PROT 7.7  ALBUMIN 3.5   No results for input(s): "LIPASE", "AMYLASE" in the last 168 hours. No results for input(s): "AMMONIA" in the last 168 hours. Coagulation Profile: No results for input(s): "INR", "PROTIME" in the last 168 hours. Cardiac Enzymes: No results for input(s): "CKTOTAL", "CKMB", "CKMBINDEX", "TROPONINI" in the last 168 hours. BNP (last 3 results) No results for input(s): "PROBNP" in the last 8760 hours. HbA1C: No results for input(s): "HGBA1C" in the last 72 hours. CBG: No results for input(s): "GLUCAP" in the last 168 hours. Lipid Profile: No results for input(s): "CHOL", "HDL", "LDLCALC", "TRIG", "CHOLHDL", "LDLDIRECT" in the last 72 hours. Thyroid Function Tests: No results for input(s): "TSH", "T4TOTAL", "FREET4", "T3FREE", "THYROIDAB" in the last 72 hours. Anemia Panel: No results for input(s): "VITAMINB12", "FOLATE", "FERRITIN", "TIBC", "IRON", "RETICCTPCT" in the last 72 hours. Sepsis Labs: Recent Labs  Lab 10/15/23 0314 10/15/23 0534  LATICACIDVEN 2.3* 2.1*    Recent Results (from the past 240 hour(s))  Blood culture (routine x 2)     Status: None (Preliminary result)   Collection Time: 10/15/23  3:14 AM   Specimen: BLOOD  Result Value Ref Range Status   Specimen Description BLOOD BLOOD LEFT ARM  Final   Special Requests   Final    BOTTLES DRAWN AEROBIC AND ANAEROBIC Blood Culture adequate volume   Culture   Final    NO GROWTH 2 DAYS Performed at [facility], [address], [place], [place] [address]    Report Status PENDING  Incomplete  Blood culture (routine x 2)     Status: None (Preliminary result)   Collection Time: 10/15/23  4:34 AM   Specimen: BLOOD RIGHT ARM  Result Value Ref Range Status   Specimen Description BLOOD RIGHT ARM  Final   Special Requests   Final    BOTTLES DRAWN AEROBIC AND ANAEROBIC Blood Culture  adequate volume   Culture   Final    NO GROWTH 2 DAYS Performed at [facility], [address], [place], [place] [address]    Report Status PENDING  Incomplete         Radiology Studies: No results found.      Scheduled Meds:  enoxaparin (LOVENOX) injection  40 mg Subcutaneous Q24H   Continuous Infusions:  ceFEPime (MAXIPIME) IV 2 g (10/17/23 0540)   vancomycin 1,250 mg (10/17/23 0620)     LOS: 2 days    Time spent: 35 Minutes.    Alba Cory, MD [facility]   If 7PM-7AM, please contact night-coverage [email or web address]  10/17/2023, 7:34 AM

## 2023-10-17 NOTE — Progress Notes (Signed)
PT Cancellation Note  Patient Details Name: Sean Mccoy MRN: [id number] DOB: 12-24-1961   Cancelled Treatment:    Reason Eval/Treat Not Completed: Patient at procedure or test/unavailable   [name] 10/17/2023, 9:45 AM Merryl Hacker, PT Acute Rehabilitation Services Office: [phone]

## 2023-10-17 NOTE — Evaluation (Signed)
Physical Therapy Evaluation Patient Details Name: Sean Mccoy MRN: [id number] DOB: 1961/06/08 Today's Date: 10/17/2023  History of Present Illness  62 yo male admitted 10/18 with worsening Rt hip pain, Rt foot infection, cellulitis. PMhx: anxiety, PTSD, ETOH abuse, Rt THA, polysubstance use, SDH, HTN  Clinical Impression  Pt pleasant, reporting difficult day with MRI and unexpected sessions within the hospital stay. Pt lives alone, reports no falls but states continued draw into trunk flexion due to hip pain lately with increased antalgic gait and difficulty performing daily mobility. Pt with decreased strength, function, activity tolerance and gait who will benefit from acute therapy to maximize safety, mobility and decrease fall risk. Encouraged OOB to chair for meals and assist from nursing staff due to IV for safety.          If plan is discharge home, recommend the following: Assist for transportation   Can travel by private vehicle        Equipment Recommendations Rollator (4 wheels)  Recommendations for Other Services       Functional Status Assessment Patient has had a recent decline in their functional status and demonstrates the ability to make significant improvements in function in a reasonable and predictable amount of time.     Precautions / Restrictions Precautions Precautions: Fall Restrictions Weight Bearing Restrictions: No      Mobility  Bed Mobility Overal bed mobility: Modified Independent             General bed mobility comments: HOB 25 degrees with rail, increased time, supervision for IV    Transfers Overall transfer level: Modified independent                 General transfer comment: supervision for IV with cues for safety    Ambulation/Gait Ambulation/Gait assistance: Contact guard assist Gait Distance (Feet): 100 Feet   Gait Pattern/deviations: Step-to pattern, Antalgic   Gait velocity interpretation: 1.31 - 2.62 ft/sec,  indicative of limited community ambulator   General Gait Details: pt frequently reaching for environmental support, flexed trunk, decreased stance and stride on RLE. Used RW end of session to bathroom with increased posture and stability. Assist for IV pole. Fatigue limiting distance  Stairs            Wheelchair Mobility     Tilt Bed    Modified Rankin (Stroke Patients Only)       Balance Overall balance assessment: Mild deficits observed, not formally tested                                           Pertinent Vitals/Pain Pain Assessment Pain Assessment: Faces Faces Pain Scale: Hurts little more Pain Location: Rt foot Pain Descriptors / Indicators: Aching, Guarding Pain Intervention(s): Limited activity within patient's tolerance, Monitored during session, Repositioned    Home Living Family/patient expects to be discharged to:: Private residence Living Arrangements: Alone Available Help at Discharge: Friend(s);Available PRN/intermittently Type of Home: Apartment Home Access: Elevator       Home Layout: One level Home Equipment: Crutches;Cane - single point      Prior Function Prior Level of Function : Independent/Modified Independent             Mobility Comments: reports walking 2 blocks to the grocery store, medical transportation       Extremity/Trunk Assessment   Upper Extremity Assessment Upper Extremity Assessment: Overall WFL for tasks assessed  Lower Extremity Assessment Lower Extremity Assessment: Overall WFL for tasks assessed (tendency to guard RLE)    Cervical / Trunk Assessment Cervical / Trunk Assessment: Normal (preference for flexion with fatigue)  Communication   Communication Communication: No apparent difficulties  Cognition Arousal: Alert Behavior During Therapy: WFL for tasks assessed/performed Overall Cognitive Status: Within Functional Limits for tasks assessed                                           General Comments      Exercises     Assessment/Plan    PT Assessment Patient needs continued PT services  PT Problem List Decreased strength;Decreased activity tolerance;Decreased balance;Decreased mobility;Pain;Decreased safety awareness;Decreased knowledge of use of DME       PT Treatment Interventions Functional mobility training;Therapeutic activities;Therapeutic exercise;Gait training;DME instruction;Balance training;Patient/family education    PT Goals (Current goals can be found in the Care Plan section)  Acute Rehab PT Goals Patient Stated Goal: walk upright without pain PT Goal Formulation: With patient Time For Goal Achievement: 10/31/23 Potential to Achieve Goals: Good    Frequency Min 1X/week     Co-evaluation               AM-PAC PT "6 Clicks" Mobility  Outcome Measure Help needed turning from your back to your side while in a flat bed without using bedrails?: A Little Help needed moving from lying on your back to sitting on the side of a flat bed without using bedrails?: A Little Help needed moving to and from a bed to a chair (including a wheelchair)?: A Little Help needed standing up from a chair using your arms (e.g., wheelchair or bedside chair)?: A Little Help needed to walk in hospital room?: A Little Help needed climbing 3-5 steps with a railing? : A Lot 6 Click Score: 17    End of Session   Activity Tolerance: Patient tolerated treatment well Patient left: Other (comment);with call bell/phone within reach (bathroom) Nurse Communication: Mobility status PT Visit Diagnosis: Other abnormalities of gait and mobility (R26.89);Pain Pain - Right/Left: Right Pain - part of body: Ankle and joints of foot    Time: 1130-1152 PT Time Calculation (min) (ACUTE ONLY): 22 min   Charges:   PT Evaluation $PT Eval Moderate Complexity: 1 Mod   PT General Charges $$ ACUTE PT VISIT: 1 Visit         Merryl Hacker, PT Acute  Rehabilitation Services Office: [phone]   Enedina Finner Evelyn Moch 10/17/2023, 12:27 PM

## 2023-10-17 NOTE — Progress Notes (Signed)
Pharmacy Antibiotic Note  Sean Mccoy is a 62 y.o. male admitted on 10/14/2023 with R foot pain and wound w/ foul odor and drainage. Pharmacy has been consulted for vancomycin and cefepime dosing. Cr <1.  10/21 Vanc peak 6 (this appears to be an error since it makes no sense) 10/21 Vanc trough 17 mcg/ml (therapeutic)   I had planned to draw another vancomycin peak after 10/21 pm dose and use it and trough to calculate AUC. However, pt  knocked his IV out when vancomycin was running (maybe 15-30 min was left) so peak would not be accurate. Peak was cancelled.  Plan: Continue Vancomycin 1250mg  IV q12h  (est AUC 551) Cefepime 2g IV q8h Follow Cr, Cx, LOT Consider vanc levels in a few days if continues  Height: 5\' 10"  (177.8 cm) Weight: 72 kg (158 lb 11.7 oz) IBW/kg (Calculated) : 73  Temp (24hrs), Avg:98.2 F (36.8 C), Min:97.5 F (36.4 C), Max:98.9 F (37.2 C)  Recent Labs  Lab 10/15/23 0314 10/15/23 0534 10/16/23 0711 10/17/23 1105 10/17/23 1659  WBC 3.7*  --  3.7*  --   --   CREATININE 0.67  --  0.73  --   --   LATICACIDVEN 2.3* 2.1*  --   --   --   VANCOTROUGH  --   --   --   --  17  VANCOPEAK  --   --   --  6*  --     Estimated Creatinine Clearance: 98.8 mL/min (by C-G formula based on SCr of 0.73 mg/dL).    No Known Allergies  Christoper Fabian, PharmD, BCPS Please see amion for complete clinical pharmacist phone list 10/17/2023 8:35 PM

## 2023-10-17 NOTE — Evaluation (Signed)
Occupational Therapy Evaluation Patient Details Name: Sean Mccoy MRN: [id number] DOB: Apr 07, 1961 Today's Date: 10/17/2023   History of Present Illness 62 yo male admitted 10/18 with worsening Rt hip pain, Rt foot infection, cellulitis. PMhx: anxiety, PTSD, ETOH abuse, Rt THA, polysubstance use, SDH, HTN   Clinical Impression   Pt lives alone in a senior apartment building with an Engineer, structural. He functions independently, but endorses difficulty reaching his R foot at times for ADLs and reports feeling as if his head and trunk are being pulled into flexion. Pt noted to use his hand to hold his head up at times when seated at EOB. He requires increased time to stand fully upright. Pt is overall functioning at a set up to supervision and pushed his IV pole to ambulate to the bathroom and back to EOB. Educated in benefits of a rollator and reinforced PT's recommendation for OPPT. Will follow acutely, do not anticipate need for post acute OT.       If plan is discharge home, recommend the following: Help with stairs or ramp for entrance;Assist for transportation    Functional Status Assessment  Patient has had a recent decline in their functional status and demonstrates the ability to make significant improvements in function in a reasonable and predictable amount of time.  Equipment Recommendations  Other (comment) (rollator)    Recommendations for Other Services       Precautions / Restrictions Precautions Precautions: Fall Restrictions Weight Bearing Restrictions: No      Mobility Bed Mobility               General bed mobility comments: sitting EOB at beginning and end of session    Transfers Overall transfer level: Modified independent Equipment used: None               General transfer comment: slow to rise      Balance Overall balance assessment: Mild deficits observed, not formally tested                                         ADL  either performed or assessed with clinical judgement   ADL Overall ADL's : Needs assistance/impaired Eating/Feeding: Independent;Sitting   Grooming: Wash/dry hands;Standing;Supervision/safety   Upper Body Bathing: Set up;Sitting   Lower Body Bathing: Supervison/ safety;Sit to/from stand   Upper Body Dressing : Set up;Sitting   Lower Body Dressing: Supervision/safety;Sit to/from stand Lower Body Dressing Details (indicate cue type and reason): can perform figure 4 at EOB Toilet Transfer: Supervision/safety;Ambulation (pushing IV pole)           Functional mobility during ADLs: Supervision/safety (pushing IV pole)       Vision Ability to See in Adequate Light: 0 Adequate Patient Visual Report: No change from baseline       Perception         Praxis         Pertinent Vitals/Pain Pain Assessment Pain Assessment: Faces Faces Pain Scale: Hurts a little bit Pain Location: Rt foot Pain Descriptors / Indicators: Sore Pain Intervention(s): Monitored during session, Repositioned     Extremity/Trunk Assessment Upper Extremity Assessment Upper Extremity Assessment: Overall WFL for tasks assessed   Lower Extremity Assessment Lower Extremity Assessment: Defer to PT evaluation   Cervical / Trunk Assessment Cervical / Trunk Assessment: Other exceptions (weakness, flexes with fatigue)   Communication Communication Communication: No apparent difficulties   Cognition Arousal:  Alert Behavior During Therapy: WFL for tasks assessed/performed Overall Cognitive Status: Within Functional Limits for tasks assessed                                       General Comments       Exercises     Shoulder Instructions      Home Living Family/patient expects to be discharged to:: Private residence Living Arrangements: Alone Available Help at Discharge: Friend(s);Available PRN/intermittently Type of Home: Apartment Home Access: Elevator     Home Layout: One  level     Bathroom Shower/Tub: Chief Strategy Officer: Standard     Home Equipment: Crutches;Cane - single point          Prior Functioning/Environment Prior Level of Function : Independent/Modified Independent             Mobility Comments: reports walking 2 blocks to the grocery store, medical transportation ADLs Comments: independent, but sometimes struggles with reaching R foot to don socks/shoes        OT Problem List: Decreased strength;Impaired balance (sitting and/or standing);Decreased knowledge of use of DME or AE;Pain      OT Treatment/Interventions: Self-care/ADL training;DME and/or AE instruction;Therapeutic activities;Patient/family education    OT Goals(Current goals can be found in the care plan section) Acute Rehab OT Goals OT Goal Formulation: With patient Time For Goal Achievement: 10/30/23 Potential to Achieve Goals: Good ADL Goals Additional ADL Goal #1: Pt will be knowledgeable in use of AE for LB ADLs for use on the days he struggles to reach his R foot. Additional ADL Goal #2: Pt will be knowledgeable in benefits of a rollator.  OT Frequency: Min 1X/week    Co-evaluation              AM-PAC OT "6 Clicks" Daily Activity     Outcome Measure Help from another person eating meals?: None Help from another person taking care of personal grooming?: A Little Help from another person toileting, which includes using toliet, bedpan, or urinal?: A Little Help from another person bathing (including washing, rinsing, drying)?: A Little Help from another person to put on and taking off regular upper body clothing?: None Help from another person to put on and taking off regular lower body clothing?: A Little 6 Click Score: 20   End of Session    Activity Tolerance: Patient tolerated treatment well Patient left: in bed;with call bell/phone within reach  OT Visit Diagnosis: Pain;Muscle weakness (generalized) (M62.81)                 Time: 1430-1501 OT Time Calculation (min): 31 min Charges:  OT General Charges $OT Visit: 1 Visit OT Evaluation $OT Eval Low Complexity: 1 Low OT Treatments $Self Care/Home Management : 8-22 mins  Sean Mccoy, OTR/L Acute Rehabilitation Services Office: [phone]  Sean Mccoy 10/17/2023, 3:20 PM

## 2023-10-17 NOTE — Consult Note (Addendum)
ORTHOPAEDIC CONSULTATION  REQUESTING PHYSICIAN: Alba Cory, MD  Chief Complaint: Pain and ulceration right foot.  HPI: Sean Mccoy is a 62 y.o. male who presents with worsening ulceration and cellulitis dorsum of the right foot.  Patient states that he has been going to the wound center at [facility] long for wound care.  Patient denies a history of diabetes.  Past Medical History:  Diagnosis Date   Abrasion of face 11/06/2018   Anxiety    COVID-19    Depression    Enlarged prostate    HTN (hypertension) 05/04/2019   Orbital floor fracture (HCC) 11/06/2018   right   PTSD (post-traumatic stress disorder)    Rheumatoid arthritis (HCC)    Past Surgical History:  Procedure Laterality Date   KNEE ARTHROSCOPY Right    ORIF ORBITAL FRACTURE Right 11/22/2018   Procedure: OPEN REDUCTION INTERNAL FIXATION (ORIF) RIGHT TRIPOD FRACTURE;  Surgeon: Peggye Form, DO;  Location: [facility];  Service: Plastics;  Laterality: Right;   TOTAL HIP ARTHROPLASTY Right 01/20/2016   Procedure: RIGHT TOTAL HIP ARTHROPLASTY ANTERIOR APPROACH;  Surgeon: Kathryne Hitch, MD;  Location: [facility] OR;  Service: Orthopedics;  Laterality: Right;   Social History   Socioeconomic History   Marital status: Single    Spouse name: Not on file   Number of children: 0   Years of education: Not on file   Highest education level: Not on file  Occupational History   Not on file  Tobacco Use   Smoking status: Never   Smokeless tobacco: Never  Vaping Use   Vaping status: Never Used  Substance and Sexual Activity   Alcohol use: Yes    Comment: occasionally   Drug use: No   Sexual activity: Yes    Birth control/protection: Condom  Other Topics Concern   Not on file  Social History Narrative   Not on file   Social Determinants of Health   Financial Resource Strain: Not on file  Food Insecurity: Food Insecurity Present (10/15/2023)   Hunger Vital Sign    Worried About Running  Out of Food in the Last Year: Sometimes true    Ran Out of Food in the Last Year: Sometimes true  Transportation Needs: No Transportation Needs (10/15/2023)   PRAPARE - Administrator, Civil Service (Medical): No    Lack of Transportation (Non-Medical): No  Physical Activity: Not on file  Stress: Not on file  Social Connections: Not on file   Family History  Problem Relation Age of Onset   Diabetes Mother    Hypertension Mother    Diabetes Father    Hypertension Father    - negative except otherwise stated in the family history section No Known Allergies Prior to Admission medications   Medication Sig Start Date End Date Taking? Authorizing Provider  oxycodone (OXY-IR) 5 MG capsule Take 10 mg by mouth every 8 (eight) hours as needed for pain.   Yes [provider]  pregabalin (LYRICA) 50 MG capsule Take 50 mg by mouth daily.   Yes [provider]  atorvastatin (LIPITOR) 40 MG tablet Take 40 mg by mouth at bedtime.    [provider]  furosemide (LASIX) 20 MG tablet Take 20 mg by mouth daily.    [provider]  hydrocortisone 2.5 % lotion Apply topically 2 (two) times daily. 05/27/22   Charlynne Pander, MD  ketoconazole (NIZORAL) 2 % cream Apply 1 application. topically daily. 03/09/22   Tiburcio Pea,  Abigail, PA-C  lisinopril-hydrochlorothiazide (ZESTORETIC) 20-25 MG tablet Take 1 tablet by mouth daily.    [provider]  magnesium oxide (MAG-OX) 400 (240 Mg) MG tablet Take 800 mg by mouth daily.    [provider]  sertraline (ZOLOFT) 25 MG tablet Take 25 mg by mouth daily.    [provider]  tamsulosin (FLOMAX) 0.4 MG CAPS capsule Take 0.4 mg by mouth daily.    [provider]   No results found. - pertinent xrays, CT, MRI studies were reviewed and independently interpreted  Positive ROS: All other systems have been reviewed and were otherwise negative with the exception of those mentioned in the HPI  and as above.  Physical Exam: General: Alert, no acute distress Psychiatric: Patient is competent for consent with normal mood and affect Lymphatic: No axillary or cervical lymphadenopathy Cardiovascular: No pedal edema Respiratory: No cyanosis, no use of accessory musculature GI: No organomegaly, abdomen is soft and non-tender    Images:  @ENCIMAGES @  Labs:  Lab Results  Component Value Date   HGBA1C 4.8 01/01/2015   ESRSEDRATE 24 (H) 10/16/2023   ESRSEDRATE 40 (H) 10/15/2023   CRP 0.5 10/16/2023   CRP 0.7 10/15/2023   REPTSTATUS PENDING 10/15/2023   GRAMSTAIN  03/09/2022    NO SQUAMOUS EPITHELIAL CELLS SEEN NO WBC SEEN NO ORGANISMS SEEN Performed at [facility], [address], [place], [place] [address]    CULT  10/15/2023    NO GROWTH 2 DAYS Performed at [facility], [address], [place], [place] [address]     Lab Results  Component Value Date   ALBUMIN 3.5 10/15/2023   ALBUMIN 4.0 01/04/2022   ALBUMIN 3.7 03/29/2020        Latest Ref Rng & Units 10/16/2023    7:11 AM 10/15/2023    3:14 AM 08/02/2023   12:09 PM  CBC EXTENDED  WBC 4.0 - 10.5 K/uL 3.7  3.7  5.3   RBC 4.22 - 5.81 MIL/uL 3.71  3.59  3.29   Hemoglobin 13.0 - 17.0 g/dL 09.8  11.9  14.7   HCT 39.0 - 52.0 % 34.9  34.2  31.1   Platelets 150 - 400 K/uL 203  200  183   NEUT# 1.7 - 7.7 K/uL  1.5  3.1   Lymph# 0.7 - 4.0 K/uL  1.4  1.2     Neurologic: Patient does not have protective sensation bilateral lower extremities.   MUSCULOSKELETAL:   Skin: Examination patient's foot appears much better than it did on the initial presentation.  The wound maceration and swelling is improving.  There are no open ulcers.  There is swelling of the second toe.  Patient has a strong palpable dorsalis pedis and posterior tibial pulse.  White cell count 3.7 with a hemoglobin of 11.3.  Hemoglobin A1c has not been checked recently.  Sed rate 24 and a C-reactive protein of  0.5.  Assessment: Assessment: Resolving ulceration cellulitis and swelling of the right foot.  Plan: Plan: I have ordered an MRI scan of the right foot to rule out osteomyelitis.  Patient has strong pulses I will cancel the ABI.  I will follow-up after the MRI is obtained.  I ordered a hemoglobin A1c.  Thank you for the consult and the opportunity to see Mr. Rendell Rabb, MD [facility] [phone] 7:46 AM

## 2023-10-18 DIAGNOSIS — L03119 Cellulitis of unspecified part of limb: Secondary | ICD-10-CM | POA: Diagnosis not present

## 2023-10-18 LAB — CBC
HCT: 36.8 % — ABNORMAL LOW (ref 39.0–52.0)
Hemoglobin: 12.1 g/dL — ABNORMAL LOW (ref 13.0–17.0)
MCH: 31.1 pg (ref 26.0–34.0)
MCHC: 32.9 g/dL (ref 30.0–36.0)
MCV: 94.6 fL (ref 80.0–100.0)
Platelets: 203 10*3/uL (ref 150–400)
RBC: 3.89 MIL/uL — ABNORMAL LOW (ref 4.22–5.81)
RDW: 14.4 % (ref 11.5–15.5)
WBC: 4 10*3/uL (ref 4.0–10.5)
nRBC: 0 % (ref 0.0–0.2)

## 2023-10-18 LAB — BASIC METABOLIC PANEL
Anion gap: 11 (ref 5–15)
BUN: 8 mg/dL (ref 8–23)
CO2: 21 mmol/L — ABNORMAL LOW (ref 22–32)
Calcium: 9.7 mg/dL (ref 8.9–10.3)
Chloride: 105 mmol/L (ref 98–111)
Creatinine, Ser: 0.64 mg/dL (ref 0.61–1.24)
GFR, Estimated: >60 mL/min (ref >60)
Glucose, Bld: 111 mg/dL — ABNORMAL HIGH (ref 70–99)
Potassium: 3.9 mmol/L (ref 3.5–5.1)
Sodium: 137 mmol/L (ref 135–145)

## 2023-10-18 MED ORDER — ZINC SULFATE 220 (50 ZN) MG PO CAPS
220.0000 mg | ORAL_CAPSULE | Freq: Every day | ORAL | Status: DC
  Administered 2023-10-18 – 2023-10-20 (×3): 220 mg via ORAL
  Filled 2023-10-18 (×3): qty 1

## 2023-10-18 MED ORDER — VITAMIN C 500 MG PO TABS
500.0000 mg | ORAL_TABLET | Freq: Every day | ORAL | Status: DC
  Administered 2023-10-18 – 2023-10-20 (×3): 500 mg via ORAL
  Filled 2023-10-18 (×3): qty 1

## 2023-10-18 MED ORDER — VITAMIN B-12 100 MCG PO TABS
100.0000 ug | ORAL_TABLET | Freq: Every day | ORAL | Status: DC
  Administered 2023-10-18 – 2023-10-20 (×3): 100 ug via ORAL
  Filled 2023-10-18 (×3): qty 1

## 2023-10-18 MED ORDER — SODIUM CHLORIDE 0.9 % IV SOLN
3.0000 g | Freq: Four times a day (QID) | INTRAVENOUS | Status: DC
  Administered 2023-10-18 – 2023-10-19 (×4): 3 g via INTRAVENOUS
  Filled 2023-10-18 (×4): qty 8

## 2023-10-18 NOTE — Progress Notes (Signed)
    Durable Medical Equipment  (From admission, onward)           Start     Ordered   10/18/23 1457  For home use only DME 4 wheeled rolling walker with seat  Once       Question:  Patient needs a walker to treat with the following condition  Answer:  Cellulitis of right foot   10/18/23 1456

## 2023-10-18 NOTE — Consult Note (Signed)
WOC Nurse Consult Note: WOC team with previous consult 10/15/2023; see that note  This patient is being followed by Dr. Lajoyce Corners  Reason for Consult: dressing options for home  Wound type: full thickness R great thru 4th toes extending onto dorsal foot  Pressure Injury POA: NA   Wound bed: dark dry necrotic tissue with some areas of yellow necrotic tissue on dorsal great, 2nd and 3rd toes; pink dry on dorsal foot  Drainage (amount, consistency, odor) minimal tan base great toenail and between 1st and 2nd digit  Periwound: edema, erythema  Dressing procedure/placement/frequency: Clean R dorsal foot, toes and in between toes with Vashe moistened gauze.  Cut strips of silver hydrofiber [place] [phone]) to place in between toes to absorb exudate.  Cover dorsal toes and foot with Vashe moistened gauze, dry gauze and wrap with Kerlix roll gauze.   Patient and I discussed options for home.  Vashe can be purchased at [employer] such as Therapist, occupational or on [employer].  A silver hydrofiber dressing can also be purchased on [employer]. Patient states Arita Miss will pay for his supplies if his primary care physician orders.  We also discussed Xeroform gauze as an option as this is antimicrobial and drying.  Could be placed on dorsal foot and cut in strips to place in between toes.  I gave him samples of the wound care items we talked about today to show to his provider.    Patient had other questions regarding compression wraps and orthopedic shoes.  I deferred these questions to podiatry or ortho.  Patient does have follow-up planned with Dr. Lajoyce Corners.     POC discussed with patient and bedside nurse. WOC team will not follow at this time. Re-consult if further needs arise.   Thank you,    Priscella Mann MSN, RN-BC, [employer] [phone]

## 2023-10-18 NOTE — Progress Notes (Signed)
Mobility Specialist Progress Note:   10/18/23 1449  Mobility  Activity Ambulated with assistance in room  Level of Assistance Moderate assist, patient does 50-74%  Assistive Device Other (Comment) (IV pole)  Distance Ambulated (ft) 10 ft  Activity Response Tolerated well  Mobility Referral Yes  $Mobility charge 1 Mobility  Mobility Specialist Start Time (ACUTE ONLY) 1435  Mobility Specialist Stop Time (ACUTE ONLY) 1440  Mobility Specialist Time Calculation (min) (ACUTE ONLY) 5 min   Pt received ambulating in room independently, looking for nurse. Informed pt to use call bell when needing RN. Nurse notified, and pt redirected back to bed. Tolerated well, c/o neck and RLE pain. Left pt with RN, all needs met, call bell in reach.   Feliciana Rossetti Mobility Specialist Please contact via Special educational needs teacher or  Rehab office at [phone]

## 2023-10-18 NOTE — Progress Notes (Signed)
Transition of Care [facility]) - Inpatient Brief Assessment   Patient Details  Name: Sean Mccoy MRN: [id number] Date of Birth: 11/07/1961  Transition of Care [facility]) CM/SW Contact:    Janae Bridgeman, RN Phone Number: 10/18/2023, 2:42 PM   Clinical Narrative: CM met with the patient to discuss TOC needs.  The patient admitted for cellulitis of Right foot and plans to return home when medically stable for discharge.  The patient was provided with resources for transportation through his [facility] Medicare and his Medicaid that is currently not listed in EPIC but patient states that he has (Medicaid).  Patient provided with information to contact [facility] Medicare and set up transportation, otherwise patient is familiar with Medicaid transport services.   Resources provided at the bedside.  Patient declines use of drugs, smoking and states that he "only drinks a beer occasionally".  OP referral placed for Outpatient Rehabilitation services to be co-signed by the MD.  I asked that bedside nurse collect dressing supplies as requested by WOC RN.  Crystal, RN will provide dressing supplies.  Patient inquired about obtaining personal care services.  I directed the patient to speak with his PCP - Dr. Marlana Latus to have PCP fill out personal care information and send through [employer] to evaluate the patient for [facility] Services.   Transition of Care Asessment: Insurance and Status: (P) Insurance coverage has been reviewed Patient has primary care physician: (P) Yes Home environment has been reviewed: (P) From home alone Prior level of function:: (P) Independent Prior/Current Home Services: (P) No current home services Social Determinants of Health Reivew: (P) SDOH reviewed interventions complete Readmission risk has been reviewed: (P) Yes Transition of care needs: (P) transition of care needs identified, TOC will continue to follow

## 2023-10-18 NOTE — Plan of Care (Signed)

## 2023-10-18 NOTE — Progress Notes (Signed)
CRITICAL VALUE ALERT - BLOOD CULTURE IDENTIFICATION (BCID)  Sean Mccoy is an 62 y.o. male who presented to [facility] on 10/14/2023 with a chief complaint of R foot cellulitis  Assessment: 1 of 4 bottle is growing GPR, no speciation.  Afebrile, WBC WNL, lactate was 2.1.  Name of physician (or Provider) Contacted: Dr. Sunnie Nielsen  Current antibiotics: vanc and cefepime  Changes to prescribed antibiotics recommended:  Recommendations declined by provider - infection may be polymicrobial Change cefepime to Unasyn  No results found for this or any previous visit.   Andreal Vultaggio D. Laney Potash, PharmD, BCPS, BCCCP 10/18/2023, 5:29 PM

## 2023-10-18 NOTE — Care Management Important Message (Signed)
Important Message  Patient Details  Name: Sean Mccoy MRN: [id number] Date of Birth: [birth year]   Important Message Given:  Yes - Medicare IM     Dorena Bodo 10/18/2023, 2:24 PM

## 2023-10-18 NOTE — TOC Initial Note (Signed)
Transition of Care [facility]) - Initial/Assessment Note    Patient Details  Name: Sean Mccoy MRN: [id number] Date of Birth: Apr 09, 1961  Transition of Care [facility]) CM/SW Contact:    Sean Coots, LCSW Phone Number: 10/18/2023, 3:06 PM  Clinical Narrative:                  This CSW and RNCM Sean Mccoy introduced themselves and their roles to patient at beside. TOC team explained discharge process and answered questions regarding DME OPPT, transportation, and Medicaid Personal Care Services. CSW provided Sean Mccoy with a packet of local transportation resources to get to and from medical appointments.   Patient stated that he had Medicaid and Medicare. Sean Mccoy informed TOC team that his friend is able to pick him up upon discharge and transport him to and from medical appointments. Patient relayed that he has a cane and crutched at home, but expressed interest in a rollator.  Expected Discharge Plan: OP Rehab Barriers to Discharge: Continued Medical Work up   Patient Goals and CMS Choice Patient states their goals for this hospitalization and ongoing recovery are:: To get better and return home CMS Medicare.gov Compare Post Acute Care list provided to:: Patient Choice offered to / list presented to : Patient Roy ownership interest in [facility].provided to:: Patient    Expected Discharge Plan and Services   Discharge Planning Services: CM Consult Post Acute Care Choice: Durable Medical Equipment Living arrangements for the past 2 months: Apartment                 DME Arranged: Walker rolling with seat DME Agency: [employer] Date DME Agency Contacted: 10/18/23 Time DME Agency Contacted: 1438 Representative spoke with at DME Agency: [employer] called to deliver Rolator to the patient's hospital room            Prior Living Arrangements/Services Living arrangements for the past 2 months: Apartment Lives with:: Self Patient language and need for  interpreter reviewed:: Yes Do you feel safe going back to the place where you live?: Yes      Need for Family Participation in Patient Care: Yes (Comment)   Current home services: DME (Cane , crutches) Criminal Activity/Legal Involvement Pertinent to Current Situation/Hospitalization: No - Comment as needed  Activities of Daily Living   ADL Screening (condition at time of admission) Independently performs ADLs?: No Does the patient have a NEW difficulty with bathing/dressing/toileting/self-feeding that is expected to last >3 days?: No Does the patient have a NEW difficulty with getting in/out of bed, walking, or climbing stairs that is expected to last >3 days?: No Does the patient have a NEW difficulty with communication that is expected to last >3 days?: No Is the patient deaf or have difficulty hearing?: No Does the patient have difficulty seeing, even when wearing glasses/contacts?: No Does the patient have difficulty concentrating, remembering, or making decisions?: No  Permission Sought/Granted Permission sought to share information with : Case Manager, Family Supports, Oceanographer granted to share information with : Yes, Verbal Permission Granted     Permission granted to share info w AGENCY: [employer] to deliver Rolator to the patient's room, OP referral for PT placed        Emotional Assessment Appearance:: Appears stated age Attitude/Demeanor/Rapport: Gracious Affect (typically observed): Accepting Orientation: : Oriented to Self, Oriented to Place, Oriented to  Time, Oriented to Situation Alcohol / Substance Use: Not Applicable Psych Involvement: No (comment)  Admission diagnosis:  Cellulitis [L03.90]  Cellulitis of foot [L03.119] Right hip pain [M25.551] Patient Active Problem List   Diagnosis Date Noted   Cellulitis and abscess of toe of right foot 10/17/2023   Cellulitis 10/15/2023   Right foot infection 10/15/2023   HTN  (hypertension) 05/04/2019   Facial fracture (HCC) 01/02/2019   SDH (subdural hematoma) (HCC) 11/07/2018   'Light-for-dates' infant with signs of fetal malnutrition 03/20/2018   Polysubstance (including opioids) dependence w/o physiol dependence (HCC) 09/11/2016   Constipation due to pain medication therapy 02/17/2016   Postoperative anemia due to acute blood loss 02/17/2016   Osteoporosis 02/17/2016   Right knee pain 02/17/2016   Avascular necrosis of bone of right hip (HCC) 01/20/2016   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, [place] - [address] AT [facility] OF [address] & [address] [place] [address] Phone: [phone] Fax: [phone]  [id number] - [place], [place] - [address] AT [facility] [address]  [place] [address] Phone: [phone] Fax: [phone]     Social Determinants of Health (SDOH) Social History: SDOH Screenings   Food Insecurity: Food Insecurity Present (10/15/2023)  Housing: Low Risk  (10/15/2023)  Transportation Needs: No Transportation Needs (10/15/2023)  Utilities: Not At Risk (10/15/2023)  Tobacco Use: Low Risk  (10/14/2023)   SDOH Interventions:     Readmission Risk Interventions    10/18/2023    2:40 PM  Readmission Risk Prevention Plan  Transportation Screening Complete  PCP or Specialist Appt within 5-7 Days Complete  Home Care Screening Complete  Medication Review (RN CM) Complete

## 2023-10-18 NOTE — Progress Notes (Addendum)
Patient ID: Sean Mccoy, male   DOB: [birth year], 62 y.o.   MRN: [id number] Patient is a 62 year old gentleman who is seen in follow-up status post MRI scan right foot.  Review of the MRI scan shows some edema in the proximal phalanx of the second toe but patient seems to have more cellulitis plantarly and no open ulcer.  I feel this could be treated as cellulitis, discharged on oral antibiotics and I will follow-up in the office in 1 week.  I ordered an A1c which is 5.2.  Patient states he has a family history of diabetes including a brother with bilateral below-knee amputations.  Recommended that he follow-up with his A1c annually.

## 2023-10-18 NOTE — Progress Notes (Signed)
PROGRESS NOTE    Dushan Beach  XBM:[id number] DOB: [birth year] DOA: 10/14/2023 PCP: [name], FNP   Brief Narrative: 62 year old noncompliant, past medical history significant for alcohol use, status post right hip surgery 7 years ago presents with worsening right hip pain and right foot infection.  CT of the right hip was nonrevealing. Orthopedic has been consulted, Dr. Lajoyce Corners. MRI of the right foot showed dorsal forefoot cellulitis.  Not abscess.  Focal marrow edema in the neck of the second proximal phalanx, nonspecific.  This could reflect early osteomyelitis if there is an overlying ulcer.   Assessment & Plan:   Principal Problem:   Cellulitis Active Problems:   Right foot infection   Cellulitis and abscess of toe of right foot   1-Right foot wound and cellulitis present on admission: Present with worsening right foot infection.   Orthopedic consulted, Dr. Lajoyce Corners.  Recommend management with  oral antibiotics at discharge.  Continue with vancomycin and cefepime.  MRI of the right foot: Dorsal forefoot cellulitis.  No abscess.  Focal marrow edema of the neck of the second proximal phalanx.  Nonspecific.  Dr. Lajoyce Corners seen this is more cellulitis. ESR 24, CRP 0.5 IV dilaudid/oral oxy PRN for pain management Will continue with IV antibiotics today Wound care consulted: Clean R dorsal foot, toes and in between toes with Vashe moistened gauze. Cut strips of silver hydrofiber [place] [phone]) to place in between toes to absorb exudate. Cover dorsal toes and foot with Vashe moistened gauze, dry gauze and wrap with Kerlix roll gauze.   Leukopenia: -Anemia panel: Iron 59, saturation 14, B12 347, folic acid 18. Resolved   Chronic normocytic anemia: Anemia panel.  Consistent with anemia of chronic disease.  Could do a trial of iron at discharge. Monitor hb.   Physical debility: He will need PT eval.   Alcohol use disorder: Monitor on CIWA>  No evidence of withdrawal  Lactic  acidosis: In setting infection.  IV antibiotics.   Right hip pain: CT Hip:  Right hip arthroplasty with no overt findings of hardware loosening or periprosthetic fractures. No acute bony abnormalities are seen. Chronic subcutaneous coarse stranding in the lateral right hip area. No superficial or deep soft tissue hematoma. -pain management.   Hypertension: Resume hydrochlorothiazide, lisinopril.  Monitor renal function.       See wound care documentation below.   Pressure Injury 10/15/23 Foot Anterior;Right (Active)  10/15/23 1558  Location: Foot  Location Orientation: Anterior;Right  Staging:   Wound Description (Comments):   Present on Admission:   Dressing Type Gauze (Comment) 10/17/23 2157        Estimated body mass index is 22.78 kg/m as calculated from the following:   Height as of this encounter: 5\' 10"  (1.778 m).   Weight as of this encounter: 72 kg.   DVT prophylaxis: Lovenox Code Status: Full code Family Communication: Care discussed with patient.  Disposition Plan:  Status is: Inpatient Remains inpatient appropriate because: management of foot infection    Consultants:  Dr. Lajoyce Corners  Procedures:  MRI  Antimicrobials:    Subjective: He still having foot pain, has less edema and it is less red. He would like to know recommendation for wound care.  Wound care has been consulted Objective: Vitals:   10/17/23 1420 10/17/23 1621 10/17/23 2305 10/18/23 0100  BP:  (!) 156/89 (!) 154/75 (!) 145/61  Pulse:  (!) 103 83 79  Resp:  18 17 18   Temp:  98.1 F (36.7 C) 98.2 F (36.8  C) 97.6 F (36.4 C)  TempSrc:  Oral Oral Oral  SpO2:  98% 99% 100%  Weight:      Height: 5\' 10"  (1.778 m)       Intake/Output Summary (Last 24 hours) at 10/18/2023 0740 Last data filed at 10/17/2023 1734 Gross per 24 hour  Intake 240 ml  Output 600 ml  Net -360 ml   Filed Weights   10/14/23 1903  Weight: 72 kg    Examination:  General exam: NAD Respiratory  system: CTA Cardiovascular system: S 1, S 2 RRR Gastrointestinal system: BS present, soft, nt Central nervous system: alert, follows command Extremities: Right foot LE with edema redness,  hyperpigmentation.   Data Reviewed: I have personally reviewed following labs and imaging studies  CBC: Recent Labs  Lab 10/15/23 0314 10/16/23 0711 10/18/23 0559  WBC 3.7* 3.7* 4.0  NEUTROABS 1.5*  --   --   HGB 11.1* 11.3* 12.1*  HCT 34.2* 34.9* 36.8*  MCV 95.3 94.1 94.6  PLT 200 203 203   Basic Metabolic Panel: Recent Labs  Lab 10/15/23 0314 10/16/23 0711 10/18/23 0559  NA 139 136 137  K 4.0 4.0 3.9  CL 99 104 105  CO2 23 23 21*  GLUCOSE 87 158* 111*  BUN 7* 5* 8  CREATININE 0.67 0.73 0.64  CALCIUM 10.2 9.3 9.7   GFR: Estimated Creatinine Clearance: 98.8 mL/min (by C-G formula based on SCr of 0.64 mg/dL). Liver Function Tests: Recent Labs  Lab 10/15/23 0314  AST 29  ALT 16  ALKPHOS 97  BILITOT 0.6  PROT 7.7  ALBUMIN 3.5   No results for input(s): "LIPASE", "AMYLASE" in the last 168 hours. No results for input(s): "AMMONIA" in the last 168 hours. Coagulation Profile: No results for input(s): "INR", "PROTIME" in the last 168 hours. Cardiac Enzymes: No results for input(s): "CKTOTAL", "CKMB", "CKMBINDEX", "TROPONINI" in the last 168 hours. BNP (last 3 results) No results for input(s): "PROBNP" in the last 8760 hours. HbA1C: Recent Labs    10/17/23 1105  HGBA1C 5.2   CBG: No results for input(s): "GLUCAP" in the last 168 hours. Lipid Profile: No results for input(s): "CHOL", "HDL", "LDLCALC", "TRIG", "CHOLHDL", "LDLDIRECT" in the last 72 hours. Thyroid Function Tests: No results for input(s): "TSH", "T4TOTAL", "FREET4", "T3FREE", "THYROIDAB" in the last 72 hours. Anemia Panel: Recent Labs    10/17/23 1105  VITAMINB12 347  FOLATE 18.1  FERRITIN 203  TIBC 419  IRON 59  RETICCTPCT 1.3   Sepsis Labs: Recent Labs  Lab 10/15/23 0314 10/15/23 0534   LATICACIDVEN 2.3* 2.1*    Recent Results (from the past 240 hour(s))  Blood culture (routine x 2)     Status: None (Preliminary result)   Collection Time: 10/15/23  3:14 AM   Specimen: BLOOD  Result Value Ref Range Status   Specimen Description BLOOD BLOOD LEFT ARM  Final   Special Requests   Final    BOTTLES DRAWN AEROBIC AND ANAEROBIC Blood Culture adequate volume   Culture   Final    NO GROWTH 3 DAYS Performed at [facility] [place] [facility], [address], [place], [place] [address]    Report Status PENDING  Incomplete  Blood culture (routine x 2)     Status: None (Preliminary result)   Collection Time: 10/15/23  4:34 AM   Specimen: BLOOD RIGHT ARM  Result Value Ref Range Status   Specimen Description BLOOD RIGHT ARM  Final   Special Requests   Final  BOTTLES DRAWN AEROBIC AND ANAEROBIC Blood Culture adequate volume   Culture   Final    NO GROWTH 3 DAYS Performed at [facility], [address], [place], [place] [address]    Report Status PENDING  Incomplete         Radiology Studies: MR FOOT RIGHT WO CONTRAST  Result Date: 10/17/2023 CLINICAL DATA:  Right foot infection. EXAM: MRI OF THE RIGHT FOREFOOT WITHOUT CONTRAST TECHNIQUE: Multiplanar, multisequence MR imaging of the right foot was performed. No intravenous contrast was administered. COMPARISON:  Right foot x-rays dated October 15, 2023. FINDINGS: Bones/Joint/Cartilage Focal marrow edema in the neck of the second proximal phalanx (series 9, image 15). Degenerative changes of the first MTP joint, midfoot, and subtalar joint with associated scattered non-focal subchondral marrow edema. No fracture or dislocation. No joint effusion. Ligaments Collateral ligaments are intact.  Lisfranc ligament is intact. Muscles and Tendons Flexor and extensor tendons are intact. No tenosynovitis. Prominent atrophy of the intrinsic foot muscles. Soft tissue Dorsal forefoot soft tissue swelling and skin thickening. No fluid collection.  No soft tissue mass. IMPRESSION: 1. Dorsal forefoot cellulitis. No abscess. 2. Focal marrow edema in the neck of the second proximal phalanx, nonspecific. This could reflect early osteomyelitis if there is an overlying ulcer. 3. Degenerative changes of the first MTP joint, midfoot, and subtalar joint. Electronically Signed   By: Obie Dredge M.D.   On: 10/17/2023 14:14        Scheduled Meds:  enoxaparin (LOVENOX) injection  40 mg Subcutaneous Q24H   folic acid  1 mg Oral Daily   lisinopril  20 mg Oral Daily   And   hydrochlorothiazide  25 mg Oral Daily   multivitamin with minerals  1 tablet Oral Daily   thiamine  100 mg Oral Daily   Or   thiamine  100 mg Intravenous Daily   Continuous Infusions:  ceFEPime (MAXIPIME) IV 2 g (10/18/23 0541)   vancomycin 1,250 mg (10/18/23 0541)     LOS: 3 days    Time spent: 35 Minutes.    Alba Cory, MD [facility]   If 7PM-7AM, please contact night-coverage [email or web address]  10/18/2023, 7:40 AM

## 2023-10-19 ENCOUNTER — Telehealth: Payer: Self-pay | Admitting: Orthopaedic Surgery

## 2023-10-19 DIAGNOSIS — L03115 Cellulitis of right lower limb: Secondary | ICD-10-CM | POA: Diagnosis not present

## 2023-10-19 MED ORDER — AMOXICILLIN-POT CLAVULANATE 875-125 MG PO TABS
1.0000 | ORAL_TABLET | Freq: Two times a day (BID) | ORAL | Status: DC
  Administered 2023-10-19 – 2023-10-20 (×2): 1 via ORAL
  Filled 2023-10-19 (×2): qty 1

## 2023-10-19 MED ORDER — AMLODIPINE BESYLATE 5 MG PO TABS
5.0000 mg | ORAL_TABLET | Freq: Every day | ORAL | Status: DC
  Administered 2023-10-19 – 2023-10-20 (×2): 5 mg via ORAL
  Filled 2023-10-19 (×2): qty 1

## 2023-10-19 MED ORDER — OXYCODONE HCL 5 MG PO TABS
5.0000 mg | ORAL_TABLET | ORAL | Status: DC | PRN
  Administered 2023-10-19 – 2023-10-20 (×4): 10 mg via ORAL
  Filled 2023-10-19 (×4): qty 2

## 2023-10-19 NOTE — Progress Notes (Signed)
HOSPITALIST ROUNDING NOTE Sean Mccoy ZOX:[id number]  DOB: 12/01/1961  DOA: 10/14/2023  PCP: [name], FNP  10/19/2023,7:26 AM   LOS: 4 days      Code Status: Full From: Home  current Dispo: Unclear--- Home with outpatient PT?   62 year old.male PTSD HTN Rheumatoid arthritis EtOH use Right hip replacement 2017 Chronic medical noncompliance 10/15/2023 presented to emergency room with unbearable pain right foot wounds present there-recently been on antibiotics on and off-wound draining foul-smelling-has been going to wound clinic X-ray of foot negative for osteomyelitis no leukocytosis X-ray CT scan hip negative for fracture infection felt to be musculoskeletal 10/21 MRI foot dorsal forefoot cellulitis no abscess focal marrow edema neck second proximal phalanx nonspecific?  Osteomyelitis   Plan  Purulent cellulitis of right lower extremity Transition IV-->Augmentin to complete 10 days ending on 10/24 Dr. Lajoyce Corners input appreciated patient will be seen by him in the outpatient setting-clean right foot with Vashe silver Hydrofiber cover wrap with Kerlix  Underlying rheumatoid arthritis knee arthritis hip surgery 7 years prior Spinal stenosis on CT lumbar spine L5-S1 Patient is on chronic oxycodone and Lyrica-increase oxycodone from 5 to 5-10 per home regimen short-term and continue Lyrica DC Dilaudid discussed with patient ESR slightly elevated on admission-May require outpatient CCP, Rola and further follow-up per rheumatologist  EtOH habituation-no actual withdrawal symptoms Discontinue protocol  Mild leukopenia likely secondary to cellulitis Resolved-needs outpatient workup-?  2/2 effective EtOH  Right hip pain CT hip shows no loosening or periprosthetic fractures on admission  HTN, uncontrolled Continues on Zestoretic as well as hydralazine 25 every 4 as needed Lasix held on admission Adding amlodipine 5 today   DVT prophylaxis: Lovenox  Status is: Inpatient Remains  inpatient appropriate because: De-escalating antibiotics and likely can discharge a.m.     Subjective: Awake coherent pleasant We discussed his chronic pains as well as his arthritis and talked about strategies to mitigate these  Objective + exam Vitals:   10/18/23 1951 10/18/23 2127 10/19/23 0100 10/19/23 0603  BP: 114/88 125/65 (!) 141/72 (!) 166/77  Pulse: 91 88 83 89  Resp: 17 17 17 17   Temp: 98.2 F (36.8 C) (!) 97.4 F (36.3 C) 98.1 F (36.7 C) 97.7 F (36.5 C)  TempSrc: Oral Oral Oral Oral  SpO2: 100% 99% 99% 100%  Weight:      Height:       Filed Weights   10/14/23 1903  Weight: 72 kg    Examination:  EOMI NCAT no focal deficit no icterus no pallor Looks about stated age S1-S2 no murmur ROM intact Wound not examined on right lower extremity per patient preference Chest is clear  Data Reviewed: reviewed   CBC    Component Value Date/Time   WBC 4.0 10/18/2023 0559   RBC 3.89 (L) 10/18/2023 0559   HGB 12.1 (L) 10/18/2023 0559   HCT 36.8 (L) 10/18/2023 0559   PLT 203 10/18/2023 0559   MCV 94.6 10/18/2023 0559   MCH 31.1 10/18/2023 0559   MCHC 32.9 10/18/2023 0559   RDW 14.4 10/18/2023 0559   LYMPHSABS 1.4 10/15/2023 0314   MONOABS 0.7 10/15/2023 0314   EOSABS 0.1 10/15/2023 0314   BASOSABS 0.0 10/15/2023 0314      Latest Ref Rng & Units 10/18/2023    5:59 AM 10/16/2023    7:11 AM 10/15/2023    3:14 AM  CMP  Glucose 70 - 99 mg/dL 811  914  87   BUN 8 - 23 mg/dL 8  5  7   Creatinine 0.61 - 1.24 mg/dL 1.61  0.96  0.45   Sodium 135 - 145 mmol/L 137  136  139   Potassium 3.5 - 5.1 mmol/L 3.9  4.0  4.0   Chloride 98 - 111 mmol/L 105  104  99   CO2 22 - 32 mmol/L 21  23  23    Calcium 8.9 - 10.3 mg/dL 9.7  9.3  40.9   Total Protein 6.5 - 8.1 g/dL   7.7   Total Bilirubin 0.3 - 1.2 mg/dL   0.6   Alkaline Phos 38 - 126 U/L   97   AST 15 - 41 U/L   29   ALT 0 - 44 U/L   16      Scheduled Meds:  ascorbic acid  500 mg Oral Daily   enoxaparin  (LOVENOX) injection  40 mg Subcutaneous Q24H   folic acid  1 mg Oral Daily   lisinopril  20 mg Oral Daily   And   hydrochlorothiazide  25 mg Oral Daily   multivitamin with minerals  1 tablet Oral Daily   thiamine  100 mg Oral Daily   Or   thiamine  100 mg Intravenous Daily   vitamin B-12  100 mcg Oral Daily   zinc sulfate  220 mg Oral Daily   Continuous Infusions:  ampicillin-sulbactam (UNASYN) IV 3 g (10/19/23 0646)   vancomycin 1,250 mg (10/19/23 0445)    Time  34  Rhetta Mura, MD  [facility]

## 2023-10-19 NOTE — Telephone Encounter (Signed)
Patient called wanting Dr. Magnus Ivan to know that he is in the hospital and asked if Dr. Magnus Ivan would stop in to him if he's in the hospital. Patient said her is in the hospital for his foot. Patient said he is on [address] [place] room 32.   The number to contact patient is [phone]

## 2023-10-19 NOTE — Progress Notes (Signed)
Physical Therapy Treatment Patient Details Name: Sean Mccoy MRN: [id number] DOB: 08/28/1961 Today's Date: 10/19/2023   History of Present Illness 62 yo male admitted 10/18 with worsening Rt hip pain, Rt foot infection, cellulitis. PMhx: anxiety, PTSD, ETOH abuse, Rt THA, polysubstance use, SDH, HTN    PT Comments  Pt greeted seated up EOB on arrival and agreeable to session with continued progress towards acute goals. Pt requiring grossly supervision for transfers and gait with rollator support as pt needing cues for safety, hand placement, upright posture and safe brake use during transfers. Pt with good carryover for cues/education throughout session. Current plan remains appropriate to address deficits and maximize functional independence, if pt transportation available. Pt continues to benefit from skilled PT services to progress toward functional mobility goals.     If plan is discharge home, recommend the following: Assist for transportation   Can travel by private Psychologist, clinical (4 wheels)    Recommendations for Other Services       Precautions / Restrictions Precautions Precautions: Fall Restrictions Weight Bearing Restrictions: No     Mobility  Bed Mobility Overal bed mobility: Modified Independent             General bed mobility comments: pt seated up EOB on arrival    Transfers Overall transfer level: Needs assistance Equipment used: Rollator (4 wheels) Transfers: Sit to/from Stand Sit to Stand: Supervision           General transfer comment: slow to rise, cues for brake use and hand placement    Ambulation/Gait Ambulation/Gait assistance: Contact guard assist, Supervision Gait Distance (Feet): 95 Feet (x 2 bouts with seated break on rollator seate) Assistive device: Rollator (4 wheels) Gait Pattern/deviations: Step-through pattern, Antalgic Gait velocity: decr     General Gait Details: CGA for safety  progressing to supervision with increased distance, cues for uproght posture and close proximity to rollator, no LOB, distance limtied by foot pain and decreased activity tolerance   Stairs             Wheelchair Mobility     Tilt Bed    Modified Rankin (Stroke Patients Only)       Balance Overall balance assessment: Mild deficits observed, not formally tested                                          Cognition Arousal: Alert Behavior During Therapy: WFL for tasks assessed/performed Overall Cognitive Status: Within Functional Limits for tasks assessed                                          Exercises      General Comments        Pertinent Vitals/Pain Pain Assessment Pain Assessment: Faces Faces Pain Scale: Hurts even more Pain Location: Rt foot with weight bearing Pain Descriptors / Indicators: Shooting Pain Intervention(s): Monitored during session, Limited activity within patient's tolerance    Home Living                          Prior Function            PT Goals (current goals can now be found in the care plan section)  Acute Rehab PT Goals PT Goal Formulation: With patient Time For Goal Achievement: 10/31/23 Progress towards PT goals: Progressing toward goals    Frequency    Min 1X/week      PT Plan      Co-evaluation              AM-PAC PT "6 Clicks" Mobility   Outcome Measure  Help needed turning from your back to your side while in a flat bed without using bedrails?: A Little Help needed moving from lying on your back to sitting on the side of a flat bed without using bedrails?: A Little Help needed moving to and from a bed to a chair (including a wheelchair)?: A Little Help needed standing up from a chair using your arms (e.g., wheelchair or bedside chair)?: A Little Help needed to walk in hospital room?: A Little Help needed climbing 3-5 steps with a railing? : A Lot 6 Click  Score: 17    End of Session   Activity Tolerance: Patient tolerated treatment well Patient left: in bed;with call bell/phone within reach;Other (comment) (seated up EOB) Nurse Communication: Mobility status PT Visit Diagnosis: Other abnormalities of gait and mobility (R26.89);Pain Pain - Right/Left: Right Pain - part of body: Ankle and joints of foot     Time: 1610-9604 PT Time Calculation (min) (ACUTE ONLY): 16 min  Charges:    $Gait Training: 8-22 mins PT General Charges $$ ACUTE PT VISIT: 1 Visit                     Jakavion Bilodeau R. PTA Acute Rehabilitation Services Office: [phone]   Catalina Antigua 10/19/2023, 12:17 PM

## 2023-10-19 NOTE — Progress Notes (Signed)
Occupational Therapy Treatment Patient Details Name: Sean Mccoy MRN: [id number] DOB: [birth year] Today's Date: 10/19/2023   History of present illness 62 yo male admitted 10/18 with worsening Rt hip pain, Rt foot infection, cellulitis. PMhx: anxiety, PTSD, ETOH abuse, Rt THA, polysubstance use, SDH, HTN   OT comments  Pt educated in use of AE for LB ADLs and issued reacher and sock aid to minimize effort he must expend on those days with he is having more weakness. Pt ambulated to bathroom with his rollator to bathroom. Instructed in use of brakes for safety. Completed toileting mod I. Pt does not think he needs tub equipment. Pt reports having some memory deficits, writing down questions to ask MD. Encouraged pt to sign up for Mychart.       If plan is discharge home, recommend the following:  Assist for transportation   Equipment Recommendations  None recommended by OT    Recommendations for Other Services      Precautions / Restrictions Precautions Precautions: Fall Restrictions Weight Bearing Restrictions: No       Mobility Bed Mobility Overal bed mobility: Modified Independent             General bed mobility comments: HOB up, use of momentum    Transfers Overall transfer level: Modified independent Equipment used: Rollator (4 wheels)               General transfer comment: slow to rise, cues for brake use     Balance Overall balance assessment: Mild deficits observed, not formally tested                                         ADL either performed or assessed with clinical judgement   ADL Overall ADL's : Needs assistance/impaired     Grooming: Wash/dry hands;Standing;Supervision/safety               Lower Body Dressing: Modified independent;Sit to/from stand Lower Body Dressing Details (indicate cue type and reason): educated in use of reacher and sock aide, pt choosing standard sock aid even though his R foot is not able  to fit in it at this time due to dressing and swelling Toilet Transfer: Supervision/safety;Ambulation;Rollator (4 wheels)   Toileting- Clothing Manipulation and Hygiene: Modified independent;Sit to/from Nurse, children's Details (indicate cue type and reason): educated pt in availability of tub equipment, does not feel he needs it Functional mobility during ADLs: Supervision/safety;Rollator (4 wheels)      Extremity/Trunk Assessment              Vision       Perception     Praxis      Cognition Arousal: Alert Behavior During Therapy: WFL for tasks assessed/performed Overall Cognitive Status: Within Functional Limits for tasks assessed                                          Exercises      Shoulder Instructions       General Comments      Pertinent Vitals/ Pain       Pain Assessment Pain Assessment: Faces Faces Pain Scale: Hurts even more Pain Location: Rt foot Pain Descriptors / Indicators: Shooting Pain Intervention(s): Monitored during session, Repositioned  Home Living  Prior Functioning/Environment              Frequency  Min 1X/week        Progress Toward Goals  OT Goals(current goals can now be found in the care plan section)  Progress towards OT goals: Progressing toward goals  Acute Rehab OT Goals OT Goal Formulation: With patient Time For Goal Achievement: 10/30/23 Potential to Achieve Goals: Good  Plan      Co-evaluation                 AM-PAC OT "6 Clicks" Daily Activity     Outcome Measure   Help from another person eating meals?: None Help from another person taking care of personal grooming?: A Little Help from another person toileting, which includes using toliet, bedpan, or urinal?: A Little Help from another person bathing (including washing, rinsing, drying)?: A Little Help from another person to put on and taking off  regular upper body clothing?: None Help from another person to put on and taking off regular lower body clothing?: A Little 6 Click Score: 20    End of Session Equipment Utilized During Treatment: Rollator (4 wheels)  OT Visit Diagnosis: Pain;Muscle weakness (generalized) (M62.81);Unsteadiness on feet (R26.81)   Activity Tolerance Patient tolerated treatment well   Patient Left in bed;with call bell/phone within reach (EOB)   Nurse Communication          Time: 4098-1191 OT Time Calculation (min): 18 min  Charges: OT General Charges $OT Visit: 1 Visit OT Treatments $Self Care/Home Management : 8-22 mins  Berna Spare, OTR/L Acute Rehabilitation Services Office: [phone]   Evern Bio 10/19/2023, 10:45 AM

## 2023-10-19 NOTE — Plan of Care (Signed)

## 2023-10-19 NOTE — Progress Notes (Signed)
Mobility Specialist Progress Note:   10/19/23 1314  Mobility  Activity Ambulated with assistance in hallway  Level of Assistance Contact guard assist, steadying assist  Assistive Device Four wheel walker  Distance Ambulated (ft) 150 ft  Activity Response Tolerated well  Mobility Referral Yes  $Mobility charge 1 Mobility  Mobility Specialist Start Time (ACUTE ONLY) 1314  Mobility Specialist Stop Time (ACUTE ONLY) 1325  Mobility Specialist Time Calculation (min) (ACUTE ONLY) 11 min   Pt received in bed, agreeable to mobility session. Tolerated well, c/o LE, neck and back pain. Used 0UV and CGA during session. Required cues to keep head up while ambulating. Returned pt to room, sitting EOB, all needs met.   Feliciana Rossetti Mobility Specialist Please contact via Special educational needs teacher or  Rehab office at [phone]

## 2023-10-20 ENCOUNTER — Other Ambulatory Visit (HOSPITAL_COMMUNITY): Payer: Self-pay

## 2023-10-20 DIAGNOSIS — L03115 Cellulitis of right lower limb: Secondary | ICD-10-CM | POA: Diagnosis not present

## 2023-10-20 LAB — CBC WITH DIFFERENTIAL/PLATELET
Abs Immature Granulocytes: 0.01 10*3/uL (ref 0.00–0.07)
Basophils Absolute: 0 10*3/uL (ref 0.0–0.1)
Basophils Relative: 1 %
Eosinophils Absolute: 0.1 10*3/uL (ref 0.0–0.5)
Eosinophils Relative: 3 %
HCT: 36.8 % — ABNORMAL LOW (ref 39.0–52.0)
Hemoglobin: 12.2 g/dL — ABNORMAL LOW (ref 13.0–17.0)
Immature Granulocytes: 0 %
Lymphocytes Relative: 33 %
Lymphs Abs: 1.5 10*3/uL (ref 0.7–4.0)
MCH: 31 pg (ref 26.0–34.0)
MCHC: 33.2 g/dL (ref 30.0–36.0)
MCV: 93.4 fL (ref 80.0–100.0)
Monocytes Absolute: 0.7 10*3/uL (ref 0.1–1.0)
Monocytes Relative: 15 %
Neutro Abs: 2.2 10*3/uL (ref 1.7–7.7)
Neutrophils Relative %: 48 %
Platelets: 231 10*3/uL (ref 150–400)
RBC: 3.94 MIL/uL — ABNORMAL LOW (ref 4.22–5.81)
RDW: 14 % (ref 11.5–15.5)
WBC: 4.6 10*3/uL (ref 4.0–10.5)
nRBC: 0 % (ref 0.0–0.2)

## 2023-10-20 LAB — BASIC METABOLIC PANEL
Anion gap: 11 (ref 5–15)
BUN: 14 mg/dL (ref 8–23)
CO2: 21 mmol/L — ABNORMAL LOW (ref 22–32)
Calcium: 10.1 mg/dL (ref 8.9–10.3)
Chloride: 105 mmol/L (ref 98–111)
Creatinine, Ser: 0.73 mg/dL (ref 0.61–1.24)
GFR, Estimated: >60 mL/min (ref >60)
Glucose, Bld: 130 mg/dL — ABNORMAL HIGH (ref 70–99)
Potassium: 4.4 mmol/L (ref 3.5–5.1)
Sodium: 137 mmol/L (ref 135–145)

## 2023-10-20 LAB — CULTURE, BLOOD (ROUTINE X 2)
Culture: NO GROWTH
Special Requests: ADEQUATE

## 2023-10-20 MED ORDER — AMOXICILLIN-POT CLAVULANATE 875-125 MG PO TABS
1.0000 | ORAL_TABLET | Freq: Two times a day (BID) | ORAL | 0 refills | Status: AC
  Filled 2023-10-20: qty 6, 3d supply, fill #0

## 2023-10-20 MED ORDER — OXYCODONE HCL 5 MG PO TABS
10.0000 mg | ORAL_TABLET | Freq: Three times a day (TID) | ORAL | 0 refills | Status: AC
  Filled 2023-10-20: qty 30, 5d supply, fill #0

## 2023-10-20 MED ORDER — ACETAMINOPHEN 325 MG PO TABS: 650.0000 mg | ORAL_TABLET | Freq: Four times a day (QID) | ORAL | Status: AC | PRN

## 2023-10-20 MED ORDER — FUROSEMIDE 20 MG PO TABS
20.0000 mg | ORAL_TABLET | Freq: Every day | ORAL | 1 refills | Status: AC
  Filled 2023-10-20: qty 30, 30d supply, fill #0

## 2023-10-20 MED ORDER — AMLODIPINE BESYLATE 5 MG PO TABS
5.0000 mg | ORAL_TABLET | Freq: Every day | ORAL | 3 refills | Status: AC
  Filled 2023-10-20: qty 30, 30d supply, fill #0

## 2023-10-20 NOTE — Discharge Summary (Signed)
Physician Discharge Summary  Sean Mccoy ZOX:[id number] DOB: 20-Apr-1961 DOA: 10/14/2023  PCP: Sean Dauer, FNP  Admit date: 10/14/2023 Discharge date: 10/20/2023  Time spent: 45 minutes  Recommendations for Outpatient Follow-up:  Suggest outpatient echocardiogram not emergently to rule out heart failure needs Chem-12 CBC in about1 week in the outpatient setting Recommend close follow-up with Dr. Milly Mccoy Please refer back to his rheumatologist-he needs to come off of his chronic pain meds  Discharge Diagnoses:  MAIN problem for hospitalization   Purulent cellulitis right lower extremity Rheumatoid arthritis hip surgery Alcohol habituation Uncontrolled hypertension Resolved leukopenia  Please see below for itemized issues addressed in HOpsital- refer to other progress notes for clarity if needed  Discharge Condition: Improved  Diet recommendation: Heart healthy  Filed Weights   10/14/23 1903  Weight: 72 kg    History of present illness:  62 year old.male PTSD HTN Rheumatoid arthritis EtOH use Right hip replacement 2017 Chronic medical noncompliance 10/15/2023 presented to emergency room with unbearable pain right foot wounds present there-recently been on antibiotics on and off-wound draining foul-smelling-has been going to wound clinic X-ray of foot negative for osteomyelitis no leukocytosis X-ray CT scan hip negative for fracture infection felt to be musculoskeletal 10/21 MRI foot dorsal forefoot cellulitis no abscess focal marrow edema neck second proximal phalanx nonspecific?  Osteomyelitis     Plan   Purulent cellulitis of right lower extremity Transition IV-->Augmentin to complete 10 days ending on 10/24 Sean Mccoy input appreciated patient will be seen by him in the outpatient setting-clean right foot with Vashe silver Hydrofiber cover wrap with Kerlix Pictures of wounds as below and seem improved compared to prior    Underlying rheumatoid arthritis knee  arthritis hip surgery 7 years prior Spinal stenosis on CT lumbar spine L5-S1 Patient is on chronic oxycodone and Lyrica I gave him a short term increase of oxycodone 10 he will need follow-up with his primary physician ESR slightly elevated on admission-May require outpatient CCP, Sean Mccoy and further follow-up per rheumatologist--- referral to be made   EtOH habituation-no actual withdrawal symptoms Discontinue protocol and had no further issues   Mild leukopenia likely secondary to cellulitis Resolved-needs outpatient workup-?  2/2 effective EtOH   Right hip pain CT hip shows no loosening or periprosthetic fractures on admission   HTN, uncontrolled Continues on Zestoretic as well as hydralazine 25 every 4 as needed Schedule Lasix was held-his blood pressure medication was augmented with amlodipine 5  ?  HFpEF He tells me he has had about 1 year history of lower extremity swelling not sure if he mentioned this to his primary He will need an outpatient echo not emergently I have told him to use his Lasix as per orders on a as needed basis with swelling   Discharge Exam: Vitals:   10/20/23 0724 10/20/23 0949  BP: (!) 151/82 (!) 156/76  Pulse: 83 92  Resp: 18   Temp: 99.5 F (37.5 C) 98.8 F (37.1 C)  SpO2: 100%     Subj on day of d/c   Well alert coherent no distress  General Exam on discharge  EOMI NCAT no focal deficit S1-S2 no murmur Abdomen soft no rebound no guarding ROM intact CTAB no added sound Wound exam as above  Discharge Instructions   Discharge Instructions     Ambulatory referral to Physical Therapy   Complete by: As directed    Iontophoresis - 4 mg/ml of dexamethasone: No   T.E.N.S. Unit Evaluation and Dispense as Indicated: No  Diet - low sodium heart healthy   Complete by: As directed    Discharge instructions   Complete by: As directed    Please ensure that you complete all the antibiotics that have been prescribed for you you will complete  the Augmentin and you need follow-up with Sean Mccoy in the outpatient setting Your blood pressures were not optimally controlled so we added amlodipine to your other meds Please make sure that you take the fluid pill as we have directed you to take as you needed every 1 to 2 days in case you gain more than 2 pounds or so in a 24-hour period of time Keep the leg elevated and consult with your primary physician to get an echocardiogram given your history of blood pressure and prior heavier drinking as you may need to get this characterized because you might have some stiffness of the walls of your heart from blood pressure--- your heart is not failing but you do have probably blood pressure related changes to your heart   Discharge wound care:   Complete by: As directed    10/19/23 1000    Wound care  Daily      Comments: Clean R dorsal foot, toes and in between toes with Vashe moistened gauze.  Cut strips of silver hydrofiber [place] [phone]) to place in between toes to absorb exudate.  Cover dorsal toes and foot with Vashe moistened gauze, dry gauze and wrap with Kerlix roll gauze   Increase activity slowly   Complete by: As directed       Allergies as of 10/20/2023   No Known Allergies      Medication List     STOP taking these medications    b complex vitamins capsule   sertraline 25 MG tablet Commonly known as: ZOLOFT       TAKE these medications    acetaminophen 325 MG tablet Commonly known as: TYLENOL Take 2 tablets (650 mg total) by mouth every 6 (six) hours as needed for mild pain (pain score 1-3), fever or headache.   amLODipine 5 MG tablet Commonly known as: NORVASC Take 1 tablet (5 mg total) by mouth daily. Start taking on: October 21, 2023   amoxicillin-clavulanate 875-125 MG tablet Commonly known as: AUGMENTIN Take 1 tablet by mouth every 12 (twelve) hours for 3 days.   atorvastatin 40 MG tablet Commonly known as: LIPITOR Take 40 mg by mouth at bedtime.    furosemide 20 MG tablet Commonly known as: LASIX Take 1 tablet (20 mg total) by mouth daily. Use this medication and take it if you gain more than 2 pounds in a 24 hours perido of time and /or if you feel like you are accumutaing fluid in your lower extremities What changed: additional instructions   lisinopril-hydrochlorothiazide 20-25 MG tablet Commonly known as: ZESTORETIC Take 1 tablet by mouth daily.   magnesium oxide 400 (240 Mg) MG tablet Commonly known as: MAG-OX Take 800 mg by mouth daily.   oxyCODONE 5 MG immediate release tablet Commonly known as: Oxy IR/ROXICODONE Take 2 tablets (10 mg total) by mouth 3 (three) times daily.   pregabalin 50 MG capsule Commonly known as: LYRICA Take 50 mg by mouth 2 (two) times daily.   tamsulosin 0.4 MG Caps capsule Commonly known as: FLOMAX Take 0.4 mg by mouth in the morning.               Durable Medical Equipment  (From admission, onward)  Start     Ordered   10/18/23 1457  For home use only DME 4 wheeled rolling walker with seat  Once       Question:  Patient needs a walker to treat with the following condition  Answer:  Cellulitis of right foot   10/18/23 1456              Discharge Care Instructions  (From admission, onward)           Start     Ordered   10/20/23 0000  Discharge wound care:       Comments: 10/19/23 1000    Wound care  Daily      Comments: Clean R dorsal foot, toes and in between toes with Vashe moistened gauze.  Cut strips of silver hydrofiber [place] [phone]) to place in between toes to absorb exudate.  Cover dorsal toes and foot with Vashe moistened gauze, dry gauze and wrap with Kerlix roll gauze   10/20/23 1051           No Known Allergies  Follow-up Information     Nadara Mustard, MD Follow up in 1 week(s).   Specialty: Orthopedic Surgery Contact information: [address] [place] [address] [phone]         [facility] [place]. Call.   Specialty: Rehabilitation Why: Please call the [facility] and follow up regarding Outpatient therapy needs. Contact information: [address] [place] [address] [phone]        Care, [facility] Follow up.   Why: [employer] will provide you with a Rolator before you are discharged home. Contact information: [address] [place] [address] [phone]                  The results of significant diagnostics from this hospitalization (including imaging, microbiology, ancillary and laboratory) are listed below for reference.    Significant Diagnostic Studies: MR FOOT RIGHT WO CONTRAST  Result Date: 10/17/2023 CLINICAL DATA:  Right foot infection. EXAM: MRI OF THE RIGHT FOREFOOT WITHOUT CONTRAST TECHNIQUE: Multiplanar, multisequence MR imaging of the right foot was performed. No intravenous contrast was administered. COMPARISON:  Right foot x-rays dated October 15, 2023. FINDINGS: Bones/Joint/Cartilage Focal marrow edema in the neck of the second proximal phalanx (series 9, image 15). Degenerative changes of the first MTP joint, midfoot, and subtalar joint with associated scattered non-focal subchondral marrow edema. No fracture or dislocation. No joint effusion. Ligaments Collateral ligaments are intact.  Lisfranc ligament is intact. Muscles and Tendons Flexor and extensor tendons are intact. No tenosynovitis. Prominent atrophy of the intrinsic foot muscles. Soft tissue Dorsal forefoot soft tissue swelling and skin thickening. No fluid collection. No soft tissue mass. IMPRESSION: 1. Dorsal forefoot cellulitis. No abscess. 2. Focal marrow edema in the neck of the second proximal phalanx, nonspecific. This could reflect early osteomyelitis if there is an overlying ulcer. 3. Degenerative changes of the first MTP joint, midfoot, and subtalar joint. Electronically Signed   By: Obie Dredge  M.D.   On: 10/17/2023 14:14   CT Hip Right Wo Contrast  Result Date: 10/15/2023 CLINICAL DATA:  Right hip pain.  Stress fracture suspected. EXAM: CT OF THE RIGHT HIP WITHOUT CONTRAST TECHNIQUE: Multidetector CT imaging of the right hip was performed according to the standard protocol. Multiplanar CT image reconstructions were also generated. RADIATION DOSE REDUCTION: This exam was performed according to the departmental dose-optimization program which includes automated exposure control,  adjustment of the mA and/or kV according to patient size and/or use of iterative reconstruction technique. COMPARISON:  AP pelvis and AP and frog-leg right hip views yesterday, CT abdomen and pelvis and reconstructions 03/29/2020. FINDINGS: Bones/Joint/Cartilage The bone mineralization is normal. There is a right hip arthroplasty again noted. There are no overt findings of hardware loosening. No dislocation. There are no periprosthetic fractures. No primary pathologic process is seen. There is slight spurring of pubic symphysis, mild narrowing and spurring right SI joint. The visualized sacrum is intact. Chronic dystrophic calcifications are again noted anterior to the superior right acetabulum. Ligaments Suboptimally assessed by CT. Muscles and Tendons No mass or fluid collections are seen allowing for spray artifact from the metallic hardware. Area tendons are grossly intact. Soft tissues Chronic subcutaneous coarse stranding in the lateral right hip area. No superficial or deep soft tissue hematoma. Structures within the right hemipelvis are largely either obscured due to spray artifact from the hip hardware or collimated out of the field of view. The prostate is normal in size and both testicles are in the scrotal sac. The right side of the bladder is unremarkable where visible. There is nonspecific increased prominence of right inguinal chain nodes. The largest of these is 1.4 cm in short axis with a slightly prominent  right external iliac chain node 1 cm short axis also new. IMPRESSION: 1. Right hip arthroplasty with no overt findings of hardware loosening or periprosthetic fractures. No acute bony abnormalities are seen. 2. Chronic subcutaneous coarse stranding in the lateral right hip area. No superficial or deep soft tissue hematoma. 3. Nonspecific increased prominence of right inguinal chain and right external iliac chain nodes. 4. Limited visualization of the right hemipelvis. No mass or fluid collections as far as seen. Electronically Signed   By: Almira Bar M.D.   On: 10/15/2023 04:38   CT Lumbar Spine Wo Contrast  Result Date: 10/15/2023 CLINICAL DATA:  Acute lumbar myelopathy EXAM: CT LUMBAR SPINE WITHOUT CONTRAST TECHNIQUE: Multidetector CT imaging of the lumbar spine was performed without intravenous contrast administration. Multiplanar CT image reconstructions were also generated. RADIATION DOSE REDUCTION: This exam was performed according to the departmental dose-optimization program which includes automated exposure control, adjustment of the mA and/or kV according to patient size and/or use of iterative reconstruction technique. COMPARISON:  Lumbar radiography 07/04/2023.  Abdominal CT 03/29/2020 FINDINGS: Segmentation: 5 lumbar type vertebrae. Alignment: Normal. Vertebrae: No acute fracture or focal pathologic process. Paraspinal and other soft tissues: Negative. Disc levels: Congenitally narrow lumbar spinal canal. Disc narrowing and bulging that is mild except at L5-S1 where there is disc collapse and gas containing fissure. Mild facet spurring at L5-S1. No neural compression seen throughout the lumbar spine. Mild borderline moderate spinal stenosis at L3-4. IMPRESSION: 1. No acute finding. 2. 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 Up to mild/moderate spinal stenosis at L3-4. Electronically Signed   By: Tiburcio Pea M.D.   On: 10/15/2023 04:09    DG Foot Complete Right  Result Date: 10/15/2023 CLINICAL DATA:  Wound on the right foot. EXAM: RIGHT FOOT COMPLETE - 3+ VIEW COMPARISON:  None Available. FINDINGS: Diffuse bone demineralization. Degenerative changes in the interphalangeal joints, first metatarsal-phalangeal joint, and intertarsal joints. No evidence of acute fracture or dislocation. No focal bone lesion or bone destruction. Bone cortex appears intact. No radiographic findings to suggest osteomyelitis. Soft tissues are unremarkable. No radiopaque soft tissue foreign bodies or soft tissue gas. IMPRESSION:  Degenerative changes in the right foot. Diffuse bone demineralization. No acute bony abnormalities. Electronically Signed   By: Burman Nieves M.D.   On: 10/15/2023 03:16   DG Hip Unilat  With Pelvis 2-3 Views Right  Result Date: 10/14/2023 CLINICAL DATA:  Right hip pain.  Hip replacement 8 years ago. EXAM: DG HIP (WITH OR WITHOUT PELVIS) 2-3V RIGHT COMPARISON:  None Available. FINDINGS: Right hip arthroplasty in expected alignment. There is no periprosthetic fracture. No definite periprosthetic lucency. Bony pelvis is intact. Pubic symphysis and sacroiliac joints are congruent. IMPRESSION: Right hip arthroplasty without complication. Electronically Signed   By: Narda Rutherford M.D.   On: 10/14/2023 19:57   US ABDOMEN COMPLETE W/ELASTOGRAPHY  Result Date: 10/07/2023 CLINICAL DATA:  Cirrhosis EXAM: ULTRASOUND ABDOMEN ULTRASOUND HEPATIC ELASTOGRAPHY TECHNIQUE: Sonography of the upper abdomen was performed. In addition, ultrasound elastography evaluation of the liver was performed. A region of interest was placed within the right lobe of the liver. Following application of a compressive sonographic pulse, tissue compressibility was assessed. Multiple assessments were performed at the selected site. Median tissue compressibility was determined. Previously, hepatic stiffness was assessed by shear wave velocity. Based on recently  published Society of Radiologists in Ultrasound consensus article, reporting is now recommended to be performed in the SI units of pressure (kiloPascals) representing hepatic stiffness/elasticity. The obtained result is compared to the published reference standards. (cACLD = compensated Advanced Chronic Liver Disease) COMPARISON:  CT 03/29/2020 FINDINGS: ULTRASOUND ABDOMEN Gallbladder: No gallstones or wall thickening visualized. No sonographic Murphy sign noted by sonographer. Common bile duct: Diameter: 5 mm Liver: Diffusely echogenic hepatic parenchyma consistent with fatty liver infiltration. With this level of echogenicity evaluation for underlying mass lesion is limited and if needed follow-up contrast CT or MRI as clinically appropriate portal vein is patent on color Doppler imaging with normal direction of blood flow towards the liver. IVC: No abnormality visualized. Pancreas: Visualized portion unremarkable. Spleen: Size and appearance within normal limits. Right Kidney: Length: 12.3 cm. Echogenicity within normal limits. No mass or hydronephrosis visualized. Left Kidney: Length: 12.9 cm. Echogenicity within normal limits. No mass or hydronephrosis visualized. Abdominal aorta: No aneurysm visualized. Other findings: No ascites. ULTRASOUND HEPATIC ELASTOGRAPHY Device: Siemens Helix VTQ Patient position: Oblique Transducer 6C1 Number of measurements: 10 Hepatic segment:  8 Median kPa: 3.6 IQR: 1.1 IQR/Median kPa ratio: 0.3 Data quality: IQR/Median kPa ratio of 0.3 or greater indicates reduced accuracy Diagnostic category:  < or = 5 kPa: high probability of being normal The use of hepatic elastography is applicable to patients with viral hepatitis and non-alcoholic fatty liver disease. At this time, there is insufficient data for the referenced cut-off values and use in other causes of liver disease, including alcoholic liver disease. Patients, however, may be assessed by elastography and serve as their own  reference standard/baseline. In patients with non-alcoholic liver disease, the values suggesting compensated advanced chronic liver disease (cACLD) may be lower, and patients may need additional testing with elasticity results of 7-9 kPa. Please note that abnormal hepatic elasticity and shear wave velocities may also be identified in clinical settings other than with hepatic fibrosis, such as: acute hepatitis, elevated right heart and central venous pressures including use of beta blockers, veno-occlusive disease (Budd-Chiari), infiltrative processes such as mastocytosis/amyloidosis/infiltrative tumor/lymphoma, extrahepatic cholestasis, with hyperemia in the post-prandial state, and with liver transplantation. Correlation with patient history, laboratory data, and clinical condition recommended. Diagnostic Categories: < or =5 kPa: high probability of being normal < or =9 kPa: in the absence  of other known clinical signs, rules out cACLD >9 kPa and ?13 kPa: suggestive of cACLD, but needs further testing >13 kPa: highly suggestive of cACLD > or =17 kPa: highly suggestive of cACLD with an increased probability of clinically significant portal hypertension IMPRESSION: ULTRASOUND ABDOMEN: Fatty liver infiltration.  No splenomegaly or ascites. ULTRASOUND HEPATIC ELASTOGRAPHY: Median kPa:  3.6 Diagnostic category:  < or = 5 kPa: high probability of being normal Electronically Signed   By: Karen Kays M.D.   On: 10/07/2023 16:44    Microbiology: Recent Results (from the past 240 hour(s))  Blood culture (routine x 2)     Status: None   Collection Time: 10/15/23  3:14 AM   Specimen: BLOOD  Result Value Ref Range Status   Specimen Description BLOOD BLOOD LEFT ARM  Final   Special Requests   Final    BOTTLES DRAWN AEROBIC AND ANAEROBIC Blood Culture adequate volume   Culture   Final    NO GROWTH 5 DAYS Performed at [facility], [address] [place], [place] [address]    Report Status 10/20/2023 FINAL   Final  Blood culture (routine x 2)     Status: None (Preliminary result)   Collection Time: 10/15/23  4:34 AM   Specimen: BLOOD RIGHT ARM  Result Value Ref Range Status   Specimen Description BLOOD RIGHT ARM  Final   Special Requests   Final    BOTTLES DRAWN AEROBIC AND ANAEROBIC Blood Culture adequate volume   Culture  Setup Time   Final    GRAM POSITIVE RODS AEROBIC BOTTLE ONLY CRITICAL RESULT CALLED TO, READ BACK BY AND VERIFIED WITH: PHARMD TWEE DANG ON 10/18/23 @ 1702 BY DRT  Performed at [facility], [address], [place], [place] [address]    Culture GRAM POSITIVE RODS  Final   Report Status PENDING  Incomplete     Labs: Basic Metabolic Panel: Recent Labs  Lab 10/15/23 0314 10/16/23 0711 10/18/23 0559 10/20/23 0651  NA 139 136 137 137  K 4.0 4.0 3.9 4.4  CL 99 104 105 105  CO2 23 23 21* 21*  GLUCOSE 87 158* 111* 130*  BUN 7* 5* 8 14  CREATININE 0.67 0.73 0.64 0.73  CALCIUM 10.2 9.3 9.7 10.1   Liver Function Tests: Recent Labs  Lab 10/15/23 0314  AST 29  ALT 16  ALKPHOS 97  BILITOT 0.6  PROT 7.7  ALBUMIN 3.5   No results for input(s): "LIPASE", "AMYLASE" in the last 168 hours. No results for input(s): "AMMONIA" in the last 168 hours. CBC: Recent Labs  Lab 10/15/23 0314 10/16/23 0711 10/18/23 0559 10/20/23 0651  WBC 3.7* 3.7* 4.0 4.6  NEUTROABS 1.5*  --   --  2.2  HGB 11.1* 11.3* 12.1* 12.2*  HCT 34.2* 34.9* 36.8* 36.8*  MCV 95.3 94.1 94.6 93.4  PLT 200 203 203 231   Cardiac Enzymes: No results for input(s): "CKTOTAL", "CKMB", "CKMBINDEX", "TROPONINI" in the last 168 hours. BNP: BNP (last 3 results) No results for input(s): "BNP" in the last 8760 hours.  ProBNP (last 3 results) No results for input(s): "PROBNP" in the last 8760 hours.  CBG: No results for input(s): "GLUCAP" in the last 168 hours.     Signed:  Rhetta Mura MD   [facility] 10/20/2023, 10:52 AM

## 2023-10-20 NOTE — Progress Notes (Signed)
Occupational Therapy Treatment Patient Details Name: Sean Mccoy MRN: [id number] DOB: [birth year] Today's Date: 10/20/2023   History of present illness 62 yo male admitted 10/18 with worsening Rt hip pain, Rt foot infection, cellulitis. PMhx: anxiety, PTSD, ETOH abuse, Rt THA, polysubstance use, SDH, HTN   OT comments  Reinforced use of AE for LB ADLs. Pt has been using his reacher since issued yesterday, less excessive bending and risk of fall with reaching toward floor. Continued conversation about benefits of tub equipment. Pt using his rollator appropriately (hand placement and brake use). Toileted and groomed mod I with rollator.       If plan is discharge home, recommend the following:  Assist for transportation   Equipment Recommendations  None recommended by OT    Recommendations for Other Services      Precautions / Restrictions Precautions Precautions: Fall Restrictions Weight Bearing Restrictions: No       Mobility Bed Mobility Overal bed mobility: Modified Independent                  Transfers Overall transfer level: Modified independent Equipment used: Rollator (4 wheels)               General transfer comment: slow to rise     Balance Overall balance assessment: Mild deficits observed, not formally tested                                         ADL either performed or assessed with clinical judgement   ADL Overall ADL's : Modified independent     Grooming: Wash/dry hands;Oral care;Standing;Modified independent         Lower Body Bathing Details (indicate cue type and reason): pt has a long handled bath sponge, recommended pt consider seated showering and made aware that medicaid will pay for         Toilet Transfer: Modified Independent;Rollator (4 wheels);Ambulation   Toileting- Clothing Manipulation and Hygiene: Modified independent;Sit to/from stand       Functional mobility during ADLs: Modified  independent;Rollator (4 wheels)      Extremity/Trunk Assessment              Vision       Perception     Praxis      Cognition Arousal: Alert Behavior During Therapy: WFL for tasks assessed/performed Overall Cognitive Status: Within Functional Limits for tasks assessed                                          Exercises      Shoulder Instructions       General Comments      Pertinent Vitals/ Pain       Pain Assessment Pain Assessment: Faces Faces Pain Scale: Hurts little more Pain Location: Rt foot with weight bearing Pain Descriptors / Indicators: Shooting, Discomfort, Grimacing Pain Intervention(s): Monitored during session, Premedicated before session, Repositioned  Home Living                                          Prior Functioning/Environment              Frequency  Min 1X/week  Progress Toward Goals  OT Goals(current goals can now be found in the care plan section)  Progress towards OT goals: Progressing toward goals  Acute Rehab OT Goals OT Goal Formulation: With patient Time For Goal Achievement: 10/30/23 Potential to Achieve Goals: Good  Plan      Co-evaluation                 AM-PAC OT "6 Clicks" Daily Activity     Outcome Measure   Help from another person eating meals?: None Help from another person taking care of personal grooming?: None Help from another person toileting, which includes using toliet, bedpan, or urinal?: None Help from another person bathing (including washing, rinsing, drying)?: None Help from another person to put on and taking off regular upper body clothing?: None Help from another person to put on and taking off regular lower body clothing?: None 6 Click Score: 24    End of Session Equipment Utilized During Treatment: Rollator (4 wheels);Gait belt  OT Visit Diagnosis: Pain;Muscle weakness (generalized) (M62.81);Unsteadiness on feet (R26.81)    Activity Tolerance Patient tolerated treatment well   Patient Left in bed;with call bell/phone within reach   Nurse Communication          Time: 0272-5366 OT Time Calculation (min): 23 min  Charges: OT General Charges $OT Visit: 1 Visit OT Treatments $Self Care/Home Management : 23-37 mins  Berna Spare, OTR/L Acute Rehabilitation Services Office: [phone]   Evern Bio 10/20/2023, 10:53 AM

## 2023-10-22 LAB — CULTURE, BLOOD (ROUTINE X 2): Special Requests: ADEQUATE

## 2023-10-26 ENCOUNTER — Ambulatory Visit (INDEPENDENT_AMBULATORY_CARE_PROVIDER_SITE_OTHER): Payer: 59 | Admitting: Orthopaedic Surgery

## 2023-10-26 ENCOUNTER — Other Ambulatory Visit: Payer: Self-pay | Admitting: Radiology

## 2023-10-26 ENCOUNTER — Other Ambulatory Visit (INDEPENDENT_AMBULATORY_CARE_PROVIDER_SITE_OTHER): Payer: 59

## 2023-10-26 DIAGNOSIS — M542 Cervicalgia: Secondary | ICD-10-CM

## 2023-10-26 DIAGNOSIS — Z96641 Presence of right artificial hip joint: Secondary | ICD-10-CM | POA: Diagnosis not present

## 2023-10-26 NOTE — Progress Notes (Signed)
The patient is someone who is 7 years out from a right total hip arthroplasty.  He had been in the emergency room earlier this month for several different issues and they did obtain a CT scan of his hip as well as plain films and these were normal.  I was able to see these.  They also had a CT scan of his lumbar spine showing some degenerative changes at L5-S1.  He comes in today for further follow-up.  He says the hip is actually doing well.  His right hip moves smoothly and fluidly with no pain at all.  X-rays today of his pelvis and right hip show well-seated total hip arthroplasty that is bone ingrown.  We had a long thorough discussion about what is bothering him the most.  He is walking very slowly.  He is being followed by my partner Dr. Lajoyce Corners for a foot issue but he says he is having a harder time just keeping his neck and head in an appropriate posture position.  He thinks a lot of his issues are coming from his neck and I agree with this based on what he is describing and just looking at the posture of how he is able to stand trying to hold his head up.  I was able to review his chart extensively.  He had a hard mechanical fall back in 2019 sustaining fractures to his tripod and maxillary sinus.  He did have a small subdural hematoma as well.  He did have a CT scan of his cervical spine that showed no acute findings.  At this point given his clinical exam findings, a MRI is warranted of the cervical spine to rule out ligamentous injury of his neck.  This is obviously chronic at this point but he is off as a having some posture issues as a relates to holding up his head and neck and I think this is reasonable medically to obtain an MRI to rule out a ligamentous injury of his neck.  We will see him back in follow-up once we have the study.

## 2023-11-01 ENCOUNTER — Ambulatory Visit: Payer: 59 | Attending: Internal Medicine | Admitting: Physical Therapy

## 2023-11-01 ENCOUNTER — Ambulatory Visit (INDEPENDENT_AMBULATORY_CARE_PROVIDER_SITE_OTHER): Payer: 59 | Admitting: Orthopedic Surgery

## 2023-11-01 DIAGNOSIS — I87333 Chronic venous hypertension (idiopathic) with ulcer and inflammation of bilateral lower extremity: Secondary | ICD-10-CM

## 2023-11-02 ENCOUNTER — Encounter: Payer: Self-pay | Admitting: Orthopedic Surgery

## 2023-11-02 NOTE — Progress Notes (Signed)
Office Visit Note   Patient: Sean Mccoy           Date of Birth: 02-01-1961           MRN: [id number] Visit Date: 11/01/2023              Requested by: [name], FNP [address] [place],  [place] [address] PCP: [name], FNP  Chief Complaint  Patient presents with   Right Foot - Follow-up      HPI: Patient is a 62 year old gentleman who is seen for initial evaluation for venous insufficiency ulceration for the lower extremities.  Patient is not on antibiotics he has been using Vaseline gauze between the toes and wearing flip-flops.  Patient states he had been treated at the wound center.  MRI scan was obtained on 10/17/2023 that showed dorsal foot cellulitis but no abscess.  There was some edema within the second toe proximal phalanx.  Assessment & Plan: Visit Diagnoses:  1. Chronic venous hypertension (idiopathic) with ulcer and inflammation of bilateral lower extremity (HCC)     Plan: Will continue with compression.  Will follow the second toe expectantly.  Follow-Up Instructions: Return in about 1 week (around 11/08/2023).   Ortho Exam  Patient is alert, oriented, no adenopathy, well-dressed, normal affect, normal respiratory effort. Examination patient is a good dorsalis pedis pulse the foot is moist and macerated there is some swelling of the second toe and great toe.  There are no open ulcers no drainage.  Patient's calf is 42 cm in circumference.  He is on Lasix 20 mg a day.  Imaging: No results found. No images are attached to the encounter.  Labs: Lab Results  Component Value Date   HGBA1C 5.2 10/17/2023   HGBA1C 4.8 01/01/2015   ESRSEDRATE 24 (H) 10/16/2023   ESRSEDRATE 40 (H) 10/15/2023   CRP 0.5 10/16/2023   CRP 0.7 10/15/2023   REPTSTATUS 10/22/2023 FINAL 10/15/2023   GRAMSTAIN  03/09/2022    NO SQUAMOUS EPITHELIAL CELLS SEEN NO WBC SEEN NO ORGANISMS SEEN Performed at [facility], [address] [place], [place]  [address]    CULT (A) 10/15/2023    DIPHTHEROIDS(CORYNEBACTERIUM SPECIES) Standardized susceptibility testing for this organism is not available. Performed at [facility], [address] [place], [place] [address]      Lab Results  Component Value Date   ALBUMIN 3.5 10/15/2023   ALBUMIN 4.0 01/04/2022   ALBUMIN 3.7 03/29/2020    Lab Results  Component Value Date   MG 1.9 05/19/2018   No results found for: "VD25OH"  No results found for: "PREALBUMIN"    Latest Ref Rng & Units 10/20/2023    6:51 AM 10/18/2023    5:59 AM 10/17/2023   11:05 AM  CBC EXTENDED  WBC 4.0 - 10.5 K/uL 4.6  4.0    RBC 4.22 - 5.81 MIL/uL 3.94  3.89  3.95   Hemoglobin 13.0 - 17.0 g/dL 35.0  09.3    HCT 81.8 - 52.0 % 36.8  36.8    Platelets 150 - 400 K/uL 231  203    NEUT# 1.7 - 7.7 K/uL 2.2     Lymph# 0.7 - 4.0 K/uL 1.5        There is no height or weight on file to calculate BMI.  Orders:  No orders of the defined types were placed in this encounter.  No orders of the defined types were placed in this  encounter.    Procedures: No procedures performed  Clinical Data: No additional findings.  ROS:  All other systems negative, except as noted in the HPI. Review of Systems  Objective: Vital Signs: There were no vitals taken for this visit.  Specialty Comments:  No specialty comments available.  PMFS History: Patient Active Problem List   Diagnosis Date Noted   Cellulitis and abscess of toe of right foot 10/17/2023   Cellulitis 10/15/2023   Right foot infection 10/15/2023   HTN (hypertension) 05/04/2019   Facial fracture (HCC) 01/02/2019   SDH (subdural hematoma) (HCC) 11/07/2018   'Light-for-dates' infant with signs of fetal malnutrition 03/20/2018   Polysubstance (including opioids) dependence w/o physiol dependence (HCC) 09/11/2016   Constipation due to pain medication therapy 02/17/2016   Postoperative anemia due to acute blood loss 02/17/2016   Osteoporosis  02/17/2016   Right knee pain 02/17/2016   Avascular necrosis of bone of right hip (HCC) 01/20/2016   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   Depression 11/29/2012   Anxiety 11/29/2012   Post traumatic stress disorder 11/29/2012   Substance induced mood disorder (HCC) 11/29/2012   Alcohol abuse 11/29/2012   Past Medical History:  Diagnosis Date   Abrasion of face 11/06/2018   Anxiety    COVID-19    Depression    Enlarged prostate    HTN (hypertension) 05/04/2019   Orbital floor fracture (HCC) 11/06/2018   right   PTSD (post-traumatic stress disorder)    Rheumatoid arthritis (HCC)     Family History  Problem Relation Age of Onset   Diabetes Mother    Hypertension Mother    Diabetes Father    Hypertension Father     Past Surgical History:  Procedure Laterality Date   KNEE ARTHROSCOPY Right    ORIF ORBITAL FRACTURE Right 11/22/2018   Procedure: OPEN REDUCTION INTERNAL FIXATION (ORIF) RIGHT TRIPOD FRACTURE;  Surgeon: Peggye Form, DO;  Location: [facility];  Service: Plastics;  Laterality: Right;   TOTAL HIP ARTHROPLASTY Right 01/20/2016   Procedure: RIGHT TOTAL HIP ARTHROPLASTY ANTERIOR APPROACH;  Surgeon: Kathryne Hitch, MD;  Location: [facility];  Service: Orthopedics;  Laterality: Right;   Social History   Occupational History   Not on file  Tobacco Use   Smoking status: Never   Smokeless tobacco: Never  Vaping Use   Vaping status: Never Used  Substance and Sexual Activity   Alcohol use: Yes    Comment: occasionally   Drug use: No   Sexual activity: Yes    Birth control/protection: Condom

## 2023-11-09 ENCOUNTER — Ambulatory Visit: Payer: 59 | Admitting: Family

## 2023-11-14 ENCOUNTER — Telehealth: Payer: Self-pay | Admitting: Orthopedic Surgery

## 2023-11-14 NOTE — Telephone Encounter (Signed)
Patient would like a call back regarding the prescription for the compression stocking he is supposed to get. States where he was told to get them, does not take his insurance and he does not have the cash to pay for them. Would like a call back to discuss this [phone]

## 2023-11-15 NOTE — Telephone Encounter (Signed)
SW pt, he says that insurance wont pay for socks. I did advise him that no insurance pays for the compression socks and this is all out of pocket cost. He will call them back today to see the OOP cost. He said he can try to pay for it but is on disability. I let him know that I can also give him discount code for online store if he wanted to compare the costs. He said he will if he needs too and will see [place] next week for follow up.

## 2023-11-18 ENCOUNTER — Ambulatory Visit
Admission: RE | Admit: 2023-11-18 | Discharge: 2023-11-18 | Disposition: A | Discharge status: Home / Self Care | Payer: 59 | Source: Ambulatory Visit | Attending: Internal Medicine | Admitting: Internal Medicine

## 2023-11-18 ENCOUNTER — Ambulatory Visit
Admission: RE | Admit: 2023-11-18 | Discharge: 2023-11-18 | Disposition: A | Discharge status: Home / Self Care | Payer: 59 | Source: Ambulatory Visit | Attending: Orthopaedic Surgery | Admitting: Orthopaedic Surgery

## 2023-11-18 DIAGNOSIS — G9389 Other specified disorders of brain: Secondary | ICD-10-CM | POA: Diagnosis not present

## 2023-11-18 DIAGNOSIS — Z8673 Personal history of transient ischemic attack (TIA), and cerebral infarction without residual deficits: Secondary | ICD-10-CM | POA: Diagnosis not present

## 2023-11-18 DIAGNOSIS — M47812 Spondylosis without myelopathy or radiculopathy, cervical region: Secondary | ICD-10-CM

## 2023-11-18 DIAGNOSIS — M542 Cervicalgia: Secondary | ICD-10-CM

## 2023-11-18 DIAGNOSIS — Z87820 Personal history of traumatic brain injury: Secondary | ICD-10-CM | POA: Diagnosis not present

## 2023-11-18 DIAGNOSIS — M4802 Spinal stenosis, cervical region: Secondary | ICD-10-CM | POA: Diagnosis not present

## 2023-11-18 MED ORDER — GADOPICLENOL 0.5 MMOL/ML IV SOLN
10.0000 mL | Freq: Once | INTRAVENOUS | Status: AC | PRN
  Administered 2023-11-18: 10 mL via INTRAVENOUS

## 2023-11-22 ENCOUNTER — Ambulatory Visit: Payer: 59 | Admitting: Family

## 2023-11-22 ENCOUNTER — Encounter: Payer: Self-pay | Admitting: Family

## 2023-11-22 DIAGNOSIS — L089 Local infection of the skin and subcutaneous tissue, unspecified: Secondary | ICD-10-CM | POA: Diagnosis not present

## 2023-11-22 DIAGNOSIS — L03031 Cellulitis of right toe: Secondary | ICD-10-CM

## 2023-11-22 MED ORDER — TERBINAFINE HCL 250 MG PO TABS: 250.0000 mg | ORAL_TABLET | Freq: Every day | ORAL | 0 refills | Status: DC

## 2023-11-23 NOTE — Progress Notes (Signed)
Office Visit Note   Patient: Sean Mccoy           Date of Birth: 04-28-1961           MRN: [id number] Visit Date: 11/22/2023              Requested by: [name], FNP [address] [place],  [place] [address] PCP: [name], FNP  Chief Complaint  Patient presents with   Right Foot - Follow-up      HPI: The patient is a 62 year old gentleman who is seen in follow-up. Was recently hospitalized for cellulitis of the right lower extremity.  Has weeping wounds over the dorsum of the right foot, October 18 to October 24.  Was discharged on Augmentin oral for 10 days.  The patient had been having Vashe foot cleansing with silver Hydrofiber cover wrap and Kerlix.  The patient reports his foot wound had improved while hospitalized  While hospitalized MRI performed was negative for abscess.  Edema to the second toe proximal phalanx was seen.  Was last seen in the office November 5 at that time was recommended to wear compression garments to the lower extremity  Today is wearing regular cotton socks and has had worsening of the ulcers over the dorsums of the toes and forefoot  Assessment & Plan: Visit Diagnoses: No diagnosis found.  Plan: Discussed the importance of keeping the webspaces and foot clean and dry.  Offered silver cell strips to the webspaces.  The patient declined would like to resume Vashe dressings.  Given a bottle of Vashe for his daily use and dressing changes recommended compression elevation will also place him on Lamisil  He prefers to follow-up with Dr. Lajoyce Corners in 1 week  Follow-Up Instructions: Return in about 1 week (around 11/29/2023).   Ortho Exam  Patient is alert, oriented, no adenopathy, well-dressed, normal affect, normal respiratory effort. On examination of the right lower extremity he does have edema and hemosiderin staining.  Hyperpigmentation of the right lower extremity with dry flaking skin over the foot he has maceration and open  ulceration over the 1st through 4th toes and dorsum of the forefoot there is weeping serous fluid this is quite painful there is no erythema or warmth  Imaging: No results found. No images are attached to the encounter.  Labs: Lab Results  Component Value Date   HGBA1C 5.2 10/17/2023   HGBA1C 4.8 01/01/2015   ESRSEDRATE 24 (H) 10/16/2023   ESRSEDRATE 40 (H) 10/15/2023   CRP 0.5 10/16/2023   CRP 0.7 10/15/2023   REPTSTATUS 10/22/2023 FINAL 10/15/2023   GRAMSTAIN  03/09/2022    NO SQUAMOUS EPITHELIAL CELLS SEEN NO WBC SEEN NO ORGANISMS SEEN Performed at [facility], [address], [place], [place] [address]    CULT (A) 10/15/2023    DIPHTHEROIDS(CORYNEBACTERIUM SPECIES) Standardized susceptibility testing for this organism is not available. Performed at [facility], [address] [place], [place] [address]      Lab Results  Component Value Date   ALBUMIN 3.5 10/15/2023   ALBUMIN 4.0 01/04/2022   ALBUMIN 3.7 03/29/2020    Lab Results  Component Value Date   MG 1.9 05/19/2018   No results found for: "VD25OH"  No results found for: "PREALBUMIN"    Latest Ref Rng & Units 10/20/2023    6:51 AM 10/18/2023    5:59 AM 10/17/2023   11:05 AM  CBC EXTENDED  WBC 4.0 - 10.5 K/uL 4.6  4.0    RBC 4.22 - 5.81 MIL/uL 3.94  3.89  3.95   Hemoglobin 13.0 - 17.0 g/dL 16.1  09.6    HCT 04.5 - 52.0 % 36.8  36.8    Platelets 150 - 400 K/uL 231  203    NEUT# 1.7 - 7.7 K/uL 2.2     Lymph# 0.7 - 4.0 K/uL 1.5        There is no height or weight on file to calculate BMI.  Orders:  No orders of the defined types were placed in this encounter.  Meds ordered this encounter  Medications   terbinafine (LAMISIL) 250 MG tablet    Sig: Take 1 tablet (250 mg total) by mouth daily.    Dispense:  42 tablet    Refill:  0     Procedures: No procedures performed  Clinical Data: No additional findings.  ROS:  All other systems negative, except as noted in the  HPI. Review of Systems  Objective: Vital Signs: There were no vitals taken for this visit.  Specialty Comments:  No specialty comments available.  PMFS History: Patient Active Problem List   Diagnosis Date Noted   Cellulitis and abscess of toe of right foot 10/17/2023   Cellulitis 10/15/2023   Right foot infection 10/15/2023   HTN (hypertension) 05/04/2019   Facial fracture (HCC) 01/02/2019   SDH (subdural hematoma) (HCC) 11/07/2018   'Light-for-dates' infant with signs of fetal malnutrition 03/20/2018   Polysubstance (including opioids) dependence w/o physiol dependence (HCC) 09/11/2016   Constipation due to pain medication therapy 02/17/2016   Postoperative anemia due to acute blood loss 02/17/2016   Osteoporosis 02/17/2016   Right knee pain 02/17/2016   Avascular necrosis of bone of right hip (HCC) 01/20/2016   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   Depression 11/29/2012   Anxiety 11/29/2012   Post traumatic stress disorder 11/29/2012   Substance induced mood disorder (HCC) 11/29/2012   Alcohol abuse 11/29/2012   Past Medical History:  Diagnosis Date   Abrasion of face 11/06/2018   Anxiety    COVID-19    Depression    Enlarged prostate    HTN (hypertension) 05/04/2019   Orbital floor fracture (HCC) 11/06/2018   right   PTSD (post-traumatic stress disorder)    Rheumatoid arthritis (HCC)     Family History  Problem Relation Age of Onset   Diabetes Mother    Hypertension Mother    Diabetes Father    Hypertension Father     Past Surgical History:  Procedure Laterality Date   KNEE ARTHROSCOPY Right    ORIF ORBITAL FRACTURE Right 11/22/2018   Procedure: OPEN REDUCTION INTERNAL FIXATION (ORIF) RIGHT TRIPOD FRACTURE;  Surgeon: Peggye Form, DO;  Location: [facility];  Service: Plastics;  Laterality: Right;   TOTAL HIP ARTHROPLASTY Right 01/20/2016   Procedure: RIGHT TOTAL HIP ARTHROPLASTY ANTERIOR APPROACH;  Surgeon:  Kathryne Hitch, MD;  Location: [facility];  Service: Orthopedics;  Laterality: Right;   Social History   Occupational History   Not on file  Tobacco Use   Smoking status: Never   Smokeless tobacco: Never  Vaping Use   Vaping status: Never Used  Substance and Sexual Activity   Alcohol use: Yes    Comment: occasionally   Drug use: No   Sexual activity: Yes    Birth control/protection: Condom

## 2023-11-29 ENCOUNTER — Ambulatory Visit: Payer: 59 | Admitting: Orthopedic Surgery

## 2023-12-27 ENCOUNTER — Ambulatory Visit: Payer: 59 | Admitting: Orthopedic Surgery

## 2024-01-04 ENCOUNTER — Encounter: Payer: Self-pay | Admitting: Family

## 2024-01-04 ENCOUNTER — Ambulatory Visit: Payer: 59 | Admitting: Family

## 2024-01-04 DIAGNOSIS — L03031 Cellulitis of right toe: Secondary | ICD-10-CM | POA: Diagnosis not present

## 2024-01-04 DIAGNOSIS — B353 Tinea pedis: Secondary | ICD-10-CM | POA: Diagnosis not present

## 2024-01-04 DIAGNOSIS — L02611 Cutaneous abscess of right foot: Secondary | ICD-10-CM

## 2024-01-04 MED ORDER — TERBINAFINE HCL 250 MG PO TABS: 250.0000 mg | ORAL_TABLET | Freq: Every day | ORAL | 0 refills | Status: AC

## 2024-01-04 NOTE — Progress Notes (Signed)
 Office Visit Note   Patient: Sean Mccoy           Date of Birth: Dec 10, 1961           MRN: [id number] Visit Date: 01/04/2024              Requested by: Milagros Arland RAMAN, FNP [address] [place],  [place] [address] PCP: Milagros Arland RAMAN, FNP  Chief Complaint  Patient presents with   Right Foot - Follow-up      HPI: The patient is a 63 year old gentleman who is seen in follow-up today for ongoing issues with his right foot he was hospitalized in October for cellulitis of the right lower extremity  Most recently has been doing Vashe wound cleansing and applying silver cell he states he is changing the dressing every few days.  He reports he has not picked up his Lamisil   He is pleased with slow resolution of the ulcer over the dorsum of his foot and toes  Assessment & Plan: Visit Diagnoses: No diagnosis found.  Plan: Reinforced prior instruction.  He will continue with Vashe wound wound cleansing silver cell packing to the webspaces recommended he pick up the Lamisil  and begin taking this course.  He will follow-up in the office with Dr. Harden  Follow-Up Instructions: Return in about 15 days (around 01/19/2024).   Ortho Exam  Patient is alert, oriented, no adenopathy, well-dressed, normal affect, normal respiratory effort. On examination right lower extremity there is stable edema without erythema warmth or weeping over the dorsum of the forefoot and toes he has resolving ulceration 1 cm in diameter over the dorsum of the great toe 5 mm in diameter over the second toe.  There is no longer any mass ration or irritation to the third and fourth webspaces his tenderness is less  Imaging: No results found. No images are attached to the encounter.  Labs: Lab Results  Component Value Date   HGBA1C 5.2 10/17/2023   HGBA1C 4.8 01/01/2015   ESRSEDRATE 24 (H) 10/16/2023   ESRSEDRATE 40 (H) 10/15/2023   CRP 0.5 10/16/2023   CRP 0.7 10/15/2023   REPTSTATUS 10/22/2023 FINAL  10/15/2023   GRAMSTAIN  03/09/2022    NO SQUAMOUS EPITHELIAL CELLS SEEN NO WBC SEEN NO ORGANISMS SEEN Performed at [facility], [address] [place], [place] [address]    CULT (A) 10/15/2023    DIPHTHEROIDS(CORYNEBACTERIUM SPECIES) Standardized susceptibility testing for this organism is not available. Performed at [facility], [address], [place], [place] [address]      Lab Results  Component Value Date   ALBUMIN 3.5 10/15/2023   ALBUMIN 4.0 01/04/2022   ALBUMIN 3.7 03/29/2020    Lab Results  Component Value Date   MG 1.9 05/19/2018   No results found for: VD25OH  No results found for: PREALBUMIN    Latest Ref Rng & Units 10/20/2023    6:51 AM 10/18/2023    5:59 AM 10/17/2023   11:05 AM  CBC EXTENDED  WBC 4.0 - 10.5 K/uL 4.6  4.0    RBC 4.22 - 5.81 MIL/uL 3.94  3.89  3.95   Hemoglobin 13.0 - 17.0 g/dL 87.7  87.8    HCT 60.9 - 52.0 % 36.8  36.8    Platelets 150 - 400 K/uL 231  203    NEUT# 1.7 - 7.7 K/uL 2.2     Lymph# 0.7 - 4.0 K/uL 1.5  There is no height or weight on file to calculate BMI.  Orders:  No orders of the defined types were placed in this encounter.  Meds ordered this encounter  Medications   terbinafine  (LAMISIL ) 250 MG tablet    Sig: Take 1 tablet (250 mg total) by mouth daily.    Dispense:  42 tablet    Refill:  0     Procedures: No procedures performed  Clinical Data: No additional findings.  ROS:  All other systems negative, except as noted in the HPI. Review of Systems  Objective: Vital Signs: There were no vitals taken for this visit.  Specialty Comments:  No specialty comments available.  PMFS History: Patient Active Problem List   Diagnosis Date Noted   Cellulitis and abscess of toe of right foot 10/17/2023   Cellulitis 10/15/2023   Right foot infection 10/15/2023   HTN (hypertension) 05/04/2019   Facial fracture (HCC) 01/02/2019   SDH (subdural hematoma) (HCC) 11/07/2018    'Light-for-dates' infant with signs of fetal malnutrition 03/20/2018   Polysubstance (including opioids) dependence w/o physiol dependence (HCC) 09/11/2016   Constipation due to pain medication therapy 02/17/2016   Postoperative anemia due to acute blood loss 02/17/2016   Osteoporosis 02/17/2016   Right knee pain 02/17/2016   Avascular necrosis of bone of right hip (HCC) 01/20/2016   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   Depression 11/29/2012   Anxiety 11/29/2012   Post traumatic stress disorder 11/29/2012   Substance induced mood disorder (HCC) 11/29/2012   Alcohol abuse 11/29/2012   Past Medical History:  Diagnosis Date   Abrasion of face 11/06/2018   Anxiety    COVID-19    Depression    Enlarged prostate    HTN (hypertension) 05/04/2019   Orbital floor fracture (HCC) 11/06/2018   right   PTSD (post-traumatic stress disorder)    Rheumatoid arthritis (HCC)     Family History  Problem Relation Age of Onset   Diabetes Mother    Hypertension Mother    Diabetes Father    Hypertension Father     Past Surgical History:  Procedure Laterality Date   KNEE ARTHROSCOPY Right    ORIF ORBITAL FRACTURE Right 11/22/2018   Procedure: OPEN REDUCTION INTERNAL FIXATION (ORIF) RIGHT TRIPOD FRACTURE;  Surgeon: Lowery Estefana RAMAN, DO;  Location: [facility];  Service: Plastics;  Laterality: Right;   TOTAL HIP ARTHROPLASTY Right 01/20/2016   Procedure: RIGHT TOTAL HIP ARTHROPLASTY ANTERIOR APPROACH;  Surgeon: Lonni CINDERELLA Poli, MD;  Location: [facility];  Service: Orthopedics;  Laterality: Right;   Social History   Occupational History   Not on file  Tobacco Use   Smoking status: Never   Smokeless tobacco: Never  Vaping Use   Vaping status: Never Used  Substance and Sexual Activity   Alcohol use: Yes    Comment: occasionally   Drug use: No   Sexual activity: Yes    Birth control/protection: Condom

## 2024-01-25 ENCOUNTER — Encounter: Payer: Self-pay | Admitting: Family

## 2024-01-25 ENCOUNTER — Ambulatory Visit (INDEPENDENT_AMBULATORY_CARE_PROVIDER_SITE_OTHER): Payer: 59 | Admitting: Family

## 2024-01-25 DIAGNOSIS — L089 Local infection of the skin and subcutaneous tissue, unspecified: Secondary | ICD-10-CM

## 2024-01-25 NOTE — Progress Notes (Signed)
Office Visit Note   Patient: Sean Mccoy           Date of Birth: 04/11/1961           MRN: [id number] Visit Date: 01/25/2024              Requested by: [name], FNP [address] [place],  [place] [address] PCP: [name], FNP  Chief Complaint  Patient presents with   Right Foot - Follow-up      HPI: The patient is a 63 year old gentleman who is seen in follow-up today for ongoing issues with his right foot.  There he does have issues with dermatitis and ulceration related to fungal infection foot he has been alternating Xeroform dressings with silver cell dressings.  He has not yet used any antifungal topical or orally.   he was hospitalized in October for cellulitis of the right lower extremity   He is pleased with slow resolution of the ulcer over the dorsum of his foot and toes.  He thinks things are improving.  Assessment & Plan: Visit Diagnoses: No diagnosis found.  Plan: Reinforced prior instruction.  He will stop using Xeroform encouraged him to use silver cell discussed antifungals.  Patient comfortable following up as needed  Follow-Up Instructions: Return in about 4 weeks (around 02/22/2024), or if symptoms worsen or fail to improve.   Ortho Exam  Patient is alert, oriented, no adenopathy, well-dressed, normal affect, normal respiratory effort. On examination right lower extremity there is stable edema without erythema warmth or weeping over the dorsum of the forefoot and toes he has resolving ulceration 1 cm in diameter over the dorsum of the great toe 5 mm in diameter over the second toe.  There is no longer any mass ration or irritation to the third and fourth webspaces his tenderness is less  Imaging: No results found. No images are attached to the encounter.  Labs: Lab Results  Component Value Date   HGBA1C 5.2 10/17/2023   HGBA1C 4.8 01/01/2015   ESRSEDRATE 24 (H) 10/16/2023   ESRSEDRATE 40 (H) 10/15/2023   CRP 0.5 10/16/2023   CRP 0.7  10/15/2023   REPTSTATUS 10/22/2023 FINAL 10/15/2023   GRAMSTAIN  03/09/2022    NO SQUAMOUS EPITHELIAL CELLS SEEN NO WBC SEEN NO ORGANISMS SEEN Performed at [facility], [address] [place], [place] [address]    CULT (A) 10/15/2023    DIPHTHEROIDS(CORYNEBACTERIUM SPECIES) Standardized susceptibility testing for this organism is not available. Performed at [facility], [address] [place], [place] [address]      Lab Results  Component Value Date   ALBUMIN 3.5 10/15/2023   ALBUMIN 4.0 01/04/2022   ALBUMIN 3.7 03/29/2020    Lab Results  Component Value Date   MG 1.9 05/19/2018   No results found for: "VD25OH"  No results found for: "PREALBUMIN"    Latest Ref Rng & Units 10/20/2023    6:51 AM 10/18/2023    5:59 AM 10/17/2023   11:05 AM  CBC EXTENDED  WBC 4.0 - 10.5 K/uL 4.6  4.0    RBC 4.22 - 5.81 MIL/uL 3.94  3.89  3.95   Hemoglobin 13.0 - 17.0 g/dL 38.7  56.4    HCT 33.2 - 52.0 % 36.8  36.8    Platelets 150 - 400 K/uL 231  203    NEUT# 1.7 - 7.7 K/uL 2.2     Lymph# 0.7 - 4.0 K/uL 1.5  There is no height or weight on file to calculate BMI.  Orders:  No orders of the defined types were placed in this encounter.  No orders of the defined types were placed in this encounter.    Procedures: No procedures performed  Clinical Data: No additional findings.  ROS:  All other systems negative, except as noted in the HPI. Review of Systems  Objective: Vital Signs: There were no vitals taken for this visit.  Specialty Comments:  No specialty comments available.  PMFS History: Patient Active Problem List   Diagnosis Date Noted   Cellulitis and abscess of toe of right foot 10/17/2023   Cellulitis 10/15/2023   Right foot infection 10/15/2023   HTN (hypertension) 05/04/2019   Facial fracture (HCC) 01/02/2019   SDH (subdural hematoma) (HCC) 11/07/2018   'Light-for-dates' infant with signs of fetal malnutrition 03/20/2018    Polysubstance (including opioids) dependence w/o physiol dependence (HCC) 09/11/2016   Constipation due to pain medication therapy 02/17/2016   Postoperative anemia due to acute blood loss 02/17/2016   Osteoporosis 02/17/2016   Right knee pain 02/17/2016   Avascular necrosis of bone of right hip (HCC) 01/20/2016   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   Depression 11/29/2012   Anxiety 11/29/2012   Post traumatic stress disorder 11/29/2012   Substance induced mood disorder (HCC) 11/29/2012   Alcohol abuse 11/29/2012   Past Medical History:  Diagnosis Date   Abrasion of face 11/06/2018   Anxiety    COVID-19    Depression    Enlarged prostate    HTN (hypertension) 05/04/2019   Orbital floor fracture (HCC) 11/06/2018   right   PTSD (post-traumatic stress disorder)    Rheumatoid arthritis (HCC)     Family History  Problem Relation Age of Onset   Diabetes Mother    Hypertension Mother    Diabetes Father    Hypertension Father     Past Surgical History:  Procedure Laterality Date   KNEE ARTHROSCOPY Right    ORIF ORBITAL FRACTURE Right 11/22/2018   Procedure: OPEN REDUCTION INTERNAL FIXATION (ORIF) RIGHT TRIPOD FRACTURE;  Surgeon: Peggye Form, DO;  Location: [facility];  Service: Plastics;  Laterality: Right;   TOTAL HIP ARTHROPLASTY Right 01/20/2016   Procedure: RIGHT TOTAL HIP ARTHROPLASTY ANTERIOR APPROACH;  Surgeon: Kathryne Hitch, MD;  Location: [facility] OR;  Service: Orthopedics;  Laterality: Right;   Social History   Occupational History   Not on file  Tobacco Use   Smoking status: Never   Smokeless tobacco: Never  Vaping Use   Vaping status: Never Used  Substance and Sexual Activity   Alcohol use: Yes    Comment: occasionally   Drug use: No   Sexual activity: Yes    Birth control/protection: Condom

## 2024-01-26 ENCOUNTER — Other Ambulatory Visit (INDEPENDENT_AMBULATORY_CARE_PROVIDER_SITE_OTHER): Payer: 59

## 2024-01-26 ENCOUNTER — Ambulatory Visit: Payer: 59 | Admitting: Orthopedic Surgery

## 2024-01-26 VITALS — BP 152/77 | HR 130 | Ht 70.0 in | Wt 160.0 lb

## 2024-01-26 DIAGNOSIS — M542 Cervicalgia: Secondary | ICD-10-CM | POA: Diagnosis not present

## 2024-01-26 DIAGNOSIS — R29898 Other symptoms and signs involving the musculoskeletal system: Secondary | ICD-10-CM

## 2024-01-26 NOTE — Progress Notes (Signed)
Orthopedic Spine Surgery Office Note  Assessment: Patient is a 63 y.o. male with significant neck pain and head ptosis. Deformity appears flexible as it corrects on supine lateral films   Plan: -Ordered labs to work up head ptosis (acetylcholinesterase antibodies, thyroid panel, ESR, CRP, CK, muscle-specific kinase)  -May refer to neurology as a next step pending blood work -Patient has tried Tylenol, narcotics, activity modification -Patient should return to office in 4 weeks, x-rays at next visit: scoliosis   Patient expressed understanding of the plan and all questions were answered to the patient's satisfaction.   ___________________________________________________________________________   History:  Patient is a 63 y.o. male who presents today for cervical spine.  Patient has noticed about 6 months of neck pain.  It has gotten progressively worse with time.  He says that his head will drop and people will tell him that he needs to lift his head up.  He has difficulty watching TV and will use his other arm to lift his head up.  He says that his neck feels more comfortable with the head down as well.  He feels that his muscles in his neck are not working and his head is getting pulled down towards the ground.  He does not have any pain rating into either upper extremity.  There is no trauma or injury that preceded the onset of the symptoms.  He states that he has not started any medications recently except pain medications.  Specifically, he has not started any statins.  I did find a statin in his chart that was started in October 2024.  However, he stated that he has never taken that medication.     Weakness: Yes, his neck feels weaker Difficulty with fine motor skills (e.g., buttoning shirts, handwriting): Denies Symptoms of imbalance: Denies Paresthesias and numbness: Denies Bowel or bladder incontinence: Denies Saddle anesthesia: Denies  Treatments tried: Tylenol, narcotics,  activity modification  Review of systems: Denies fevers and chills, night sweats, unexplained weight loss, history of cancer.  Has had pain that wakes him at night  Past medical history: Depression/anxiety PTSD HTN BPH HLD OSA  Allergies: NKDA  Past surgical history:  Right knee arthroscopy Right hand fracture ORIF Right THA  Social history: Denies use of nicotine product (smoking, vaping, patches, smokeless) Alcohol use: Rare Denies recreational drug use   Physical Exam:  BMI of 23.0  General: no acute distress, appears stated age Neurologic: alert, answering questions appropriately, following commands Respiratory: unlabored breathing on room air, symmetric chest rise Psychiatric: appropriate affect, normal cadence to speech   MSK (spine):  -Strength exam      Left  Right Grip strength                5/5  5/5 Interosseus   5/5   5/5 Wrist extension  5/5  5/5 Wrist flexion   5/5  5/5 Elbow flexion   5/5  5/5 Deltoid    5/5  5/5  EHL    5/5  5/5 TA    5/5  5/5 GSC    5/5  5/5 Knee extension  5/5  5/5 Hip flexion   5/5  5/5  -Sensory exam    Sensation intact to light touch in L3-S1 nerve distributions of bilateral lower extremities  Sensation intact to light touch in C5-T1 nerve distributions of bilateral upper extremities  -Brachioradialis DTR: 2/4 on the left, 2/4 on the right -Biceps DTR: 2/4 on the left, 2/4 on the right -Triceps DTR: 2/4 on  the left, 2/4 on the right -Triceps DTR: 2/4 on the left, 2/4 on the right -Achilles DTR: 2/4 on the left, 2/4 on the right -Patellar tendon DTR: 2/4 on the left, 2/4 on the right  -Spurling: Negative bilaterally -Hoffman sign: Negative bilaterally -Clonus: No beats bilaterally -Interosseous wasting: None seen -Grip and release test: Negative -Romberg: Negative -Gait: Normal  -Positive sagittal balance at the cervical spine on inspection.  Patient seem to be having difficulty maintaining horizontal gaze  with dropped head posture  Left shoulder exam: No pain through range of motion Right shoulder exam: No pain through range of motion  Imaging: XRs of the cervical spine from 01/26/2024 were independently reviewed and interpreted, showing C2 slope of 40 degrees.  Cervical SVA of 5.2 cm.  Disc height loss with anterior osteophyte formation at C4/5, C5/6, C6/7.  No evidence of instability on flexion/extension views.  No fracture or dislocation seen.  XR of the cervical spine from 01/26/2024 was independently reviewed and interpreted, showing correction of prior cervical kyphosis with a neutral alignment.  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  Left-sided foraminal stenosis at C2/3.  Bilateral foraminal stenosis at C3/4.  Right-sided foraminal stenosis at C4/5.  No T2 cord signal change seen.   Patient name: Sean Mccoy Patient MRN: [id number] Date of visit: 01/26/24

## 2024-02-15 ENCOUNTER — Ambulatory Visit: Payer: 59 | Admitting: Family

## 2024-02-21 ENCOUNTER — Ambulatory Visit: Payer: 59 | Admitting: Family

## 2024-02-24 ENCOUNTER — Ambulatory Visit: Payer: 59 | Admitting: Family

## 2024-02-29 ENCOUNTER — Telehealth: Payer: Self-pay

## 2024-02-29 ENCOUNTER — Ambulatory Visit: Payer: 59 | Admitting: Family

## 2024-02-29 NOTE — Telephone Encounter (Signed)
 Patients PCP called concerning labs being collected, but no result. Stated that they will be collecting those labs today.

## 2024-03-13 ENCOUNTER — Emergency Department (HOSPITAL_COMMUNITY)
Admission: EM | Admit: 2024-03-13 | Discharge: 2024-03-13 | Disposition: A | Discharge status: Home / Self Care | Attending: Emergency Medicine | Admitting: Emergency Medicine

## 2024-03-13 ENCOUNTER — Encounter (HOSPITAL_COMMUNITY): Payer: Self-pay

## 2024-03-13 ENCOUNTER — Other Ambulatory Visit: Payer: Self-pay

## 2024-03-13 DIAGNOSIS — R109 Unspecified abdominal pain: Secondary | ICD-10-CM | POA: Diagnosis not present

## 2024-03-13 DIAGNOSIS — R197 Diarrhea, unspecified: Secondary | ICD-10-CM | POA: Diagnosis not present

## 2024-03-13 DIAGNOSIS — Z79891 Long term (current) use of opiate analgesic: Secondary | ICD-10-CM | POA: Diagnosis not present

## 2024-03-13 DIAGNOSIS — R000 Tachycardia, unspecified: Secondary | ICD-10-CM | POA: Insufficient documentation

## 2024-03-13 DIAGNOSIS — Z79899 Other long term (current) drug therapy: Secondary | ICD-10-CM | POA: Insufficient documentation

## 2024-03-13 DIAGNOSIS — I10 Essential (primary) hypertension: Secondary | ICD-10-CM | POA: Insufficient documentation

## 2024-03-13 DIAGNOSIS — R0682 Tachypnea, not elsewhere classified: Secondary | ICD-10-CM | POA: Diagnosis not present

## 2024-03-13 DIAGNOSIS — R825 Elevated urine levels of drugs, medicaments and biological substances: Secondary | ICD-10-CM | POA: Insufficient documentation

## 2024-03-13 DIAGNOSIS — G8929 Other chronic pain: Secondary | ICD-10-CM | POA: Diagnosis not present

## 2024-03-13 DIAGNOSIS — R112 Nausea with vomiting, unspecified: Secondary | ICD-10-CM | POA: Insufficient documentation

## 2024-03-13 DIAGNOSIS — R059 Cough, unspecified: Secondary | ICD-10-CM | POA: Diagnosis not present

## 2024-03-13 LAB — COMPREHENSIVE METABOLIC PANEL
ALT: 18 U/L (ref 0–44)
AST: 45 U/L — ABNORMAL HIGH (ref 15–41)
Albumin: 3.7 g/dL (ref 3.5–5.0)
Alkaline Phosphatase: 77 U/L (ref 38–126)
Anion gap: 18 — ABNORMAL HIGH (ref 5–15)
BUN: 5 mg/dL — ABNORMAL LOW (ref 8–23)
CO2: 16 mmol/L — ABNORMAL LOW (ref 22–32)
Calcium: 9.4 mg/dL (ref 8.9–10.3)
Chloride: 101 mmol/L (ref 98–111)
Creatinine, Ser: 0.74 mg/dL (ref 0.61–1.24)
GFR, Estimated: >60 mL/min (ref >60)
Glucose, Bld: 112 mg/dL — ABNORMAL HIGH (ref 70–99)
Potassium: 3.4 mmol/L — ABNORMAL LOW (ref 3.5–5.1)
Sodium: 135 mmol/L (ref 135–145)
Total Bilirubin: 0.6 mg/dL (ref 0.0–1.2)
Total Protein: 8.1 g/dL (ref 6.5–8.1)

## 2024-03-13 LAB — CBC
HCT: 36.9 % — ABNORMAL LOW (ref 39.0–52.0)
Hemoglobin: 12.5 g/dL — ABNORMAL LOW (ref 13.0–17.0)
MCH: 33.1 pg (ref 26.0–34.0)
MCHC: 33.9 g/dL (ref 30.0–36.0)
MCV: 97.6 fL (ref 80.0–100.0)
Platelets: 208 10*3/uL (ref 150–400)
RBC: 3.78 MIL/uL — ABNORMAL LOW (ref 4.22–5.81)
RDW: 14.8 % (ref 11.5–15.5)
WBC: 5.1 10*3/uL (ref 4.0–10.5)
nRBC: 0.4 % — ABNORMAL HIGH (ref 0.0–0.2)

## 2024-03-13 LAB — URINALYSIS, ROUTINE W REFLEX MICROSCOPIC
Bilirubin Urine: NEGATIVE
Glucose, UA: NEGATIVE mg/dL
Hgb urine dipstick: NEGATIVE
Ketones, ur: NEGATIVE mg/dL
Leukocytes,Ua: NEGATIVE
Nitrite: NEGATIVE
Protein, ur: NEGATIVE mg/dL
Specific Gravity, Urine: 1.011 (ref 1.005–1.030)
pH: 5 (ref 5.0–8.0)

## 2024-03-13 LAB — RESP PANEL BY RT-PCR (RSV, FLU A&B, COVID)  RVPGX2
Influenza A by PCR: NEGATIVE
Influenza B by PCR: NEGATIVE
Resp Syncytial Virus by PCR: NEGATIVE
SARS Coronavirus 2 by RT PCR: NEGATIVE

## 2024-03-13 LAB — RAPID URINE DRUG SCREEN, HOSP PERFORMED
Amphetamines: NOT DETECTED
Barbiturates: NOT DETECTED
Benzodiazepines: NOT DETECTED
Cocaine: NOT DETECTED
Opiates: POSITIVE — AB
Tetrahydrocannabinol: NOT DETECTED

## 2024-03-13 LAB — LIPASE, BLOOD: Lipase: 31 U/L (ref 11–51)

## 2024-03-13 MED ORDER — BUPRENORPHINE HCL-NALOXONE HCL 8-2 MG SL SUBL
1.0000 | SUBLINGUAL_TABLET | Freq: Once | SUBLINGUAL | Status: AC
  Administered 2024-03-13: 1 via SUBLINGUAL
  Filled 2024-03-13: qty 1

## 2024-03-13 MED ORDER — POTASSIUM CHLORIDE CRYS ER 20 MEQ PO TBCR
40.0000 meq | EXTENDED_RELEASE_TABLET | Freq: Once | ORAL | Status: AC
  Administered 2024-03-13: 40 meq via ORAL
  Filled 2024-03-13: qty 2

## 2024-03-13 MED ORDER — KETOROLAC TROMETHAMINE 15 MG/ML IJ SOLN
15.0000 mg | Freq: Once | INTRAMUSCULAR | Status: AC
  Administered 2024-03-13: 15 mg via INTRAVENOUS
  Filled 2024-03-13: qty 1

## 2024-03-13 MED ORDER — ONDANSETRON 4 MG PO TBDP: 4.0000 mg | ORAL_TABLET | Freq: Three times a day (TID) | ORAL | 0 refills | Status: AC | PRN

## 2024-03-13 MED ORDER — ONDANSETRON HCL 4 MG/2ML IJ SOLN
4.0000 mg | Freq: Once | INTRAMUSCULAR | Status: AC
  Administered 2024-03-13: 4 mg via INTRAVENOUS
  Filled 2024-03-13: qty 2

## 2024-03-13 MED ORDER — SODIUM CHLORIDE 0.9 % IV BOLUS
1000.0000 mL | Freq: Once | INTRAVENOUS | Status: AC
  Administered 2024-03-13: 1000 mL via INTRAVENOUS

## 2024-03-13 MED ORDER — BUPRENORPHINE HCL-NALOXONE HCL 8-2 MG SL SUBL: 1.0000 | SUBLINGUAL_TABLET | Freq: Every day | SUBLINGUAL | 0 refills | Status: AC

## 2024-03-13 NOTE — ED Provider Notes (Signed)
 [facility] EMERGENCY DEPARTMENT AT [facility] [facility] Provider Note   CSN: [id number] Arrival date & time: 03/13/24  0865     History  Chief Complaint  Patient presents with   Withdrawal   Emesis   Nausea   Abdominal Pain   Cough    Sean Mccoy is a 63 y.o. male.   Emesis Associated symptoms: abdominal pain and cough   Abdominal Pain Associated symptoms: cough and vomiting   Cough  Patient presents with flulike symptoms such as congestion, sneezing, coughing, nausea, vomiting, stomach pain, and diarrhea.  Symptoms started on Monday.  He feels like he could also be withdrawing from oxycodone since his last dose was Sunday.  He was at an event over the weekend and accidentally left his medication in the hotel.  He usually takes oxycodone 5 mg 3 times daily for chronic pain.  No fevers, chest pain, shortness of breath.     Home Medications Prior to Admission medications   Medication Sig Start Date End Date Taking? Authorizing Provider  buprenorphine-naloxone (SUBOXONE) 8-2 mg SUBL SL tablet Place 1 tablet under the tongue daily for 15 doses. 03/13/24 03/28/24 Yes Cyleigh Massaro, Earvin Hansen, MD  ondansetron (ZOFRAN-ODT) 4 MG disintegrating tablet Take 1 tablet (4 mg total) by mouth every 8 (eight) hours as needed for nausea or vomiting. 03/13/24  Yes Evette Georges, MD  acetaminophen (TYLENOL) 325 MG tablet Take 2 tablets (650 mg total) by mouth every 6 (six) hours as needed for mild pain (pain score 1-3), fever or headache. 10/20/23   Rhetta Mura, MD  amLODipine (NORVASC) 5 MG tablet Take 1 tablet (5 mg total) by mouth daily. 10/21/23   Rhetta Mura, MD  atorvastatin (LIPITOR) 40 MG tablet Take 40 mg by mouth at bedtime.    [provider]  furosemide (LASIX) 20 MG tablet Take 1 tablet (20 mg total) by mouth daily. Use this medication and take it if you gain more than 2 pounds in a 24 hours perido of time and /or if you feel like you are accumutaing fluid in your lower  extremities 10/20/23   Rhetta Mura, MD  lisinopril-hydrochlorothiazide (ZESTORETIC) 20-25 MG tablet Take 1 tablet by mouth daily.    [provider]  magnesium oxide (MAG-OX) 400 (240 Mg) MG tablet Take 800 mg by mouth daily.    [provider]  oxyCODONE (OXY IR/ROXICODONE) 5 MG immediate release tablet Take 2 tablets (10 mg total) by mouth 3 (three) times daily. 10/20/23   Rhetta Mura, MD  pregabalin (LYRICA) 50 MG capsule Take 50 mg by mouth 2 (two) times daily.    [provider]  tamsulosin (FLOMAX) 0.4 MG CAPS capsule Take 0.4 mg by mouth in the morning.    [provider]  terbinafine (LAMISIL) 250 MG tablet Take 1 tablet (250 mg total) by mouth daily. 01/04/24   Adonis Huguenin, NP      Allergies    Patient has no known allergies.    Review of Systems   Review of Systems  Respiratory:  Positive for cough.   Gastrointestinal:  Positive for abdominal pain and vomiting.    Physical Exam Updated Vital Signs BP (!) 159/83   Pulse 90   Temp 98.5 F (36.9 C)   Resp 19   Ht 5\' 10"  (1.778 m)   Wt 72.6 kg   SpO2 99%   BMI 22.96 kg/m  Physical Exam  ED Results / Procedures / Treatments   Labs (all labs ordered are listed,  but only abnormal results are displayed) Labs Reviewed  COMPREHENSIVE METABOLIC PANEL - Abnormal; Notable for the following components:      Result Value   Potassium 3.4 (*)    CO2 16 (*)    Glucose, Bld 112 (*)    BUN 5 (*)    AST 45 (*)    Anion gap 18 (*)    All other components within normal limits  CBC - Abnormal; Notable for the following components:   RBC 3.78 (*)    Hemoglobin 12.5 (*)    HCT 36.9 (*)    nRBC 0.4 (*)    All other components within normal limits  RAPID URINE DRUG SCREEN, HOSP PERFORMED - Abnormal; Notable for the following components:   Opiates POSITIVE (*)    All other components within normal limits  RESP PANEL BY RT-PCR (RSV, FLU A&B, COVID)  RVPGX2  LIPASE, BLOOD   URINALYSIS, ROUTINE W REFLEX MICROSCOPIC    EKG EKG Interpretation Date/Time:  Tuesday March 13 2024 08:02:34 EDT Ventricular Rate:  100 PR Interval:  174 QRS Duration:  87 QT Interval:  369 QTC Calculation: 476 R Axis:   54  Text Interpretation: Sinus tachycardia Biatrial enlargement Probable left ventricular hypertrophy Borderline prolonged QT interval No significant change since last tracing Confirmed by [name] [id number]) on 03/13/2024 8:10:40 AM  Radiology No results found.  Procedures Procedures    Medications Ordered in ED Medications  sodium chloride 0.9 % bolus 1,000 mL (1,000 mLs Intravenous New Bag/Given 03/13/24 0833)  ondansetron (ZOFRAN) injection 4 mg (4 mg Intravenous Given 03/13/24 0833)  ketorolac (TORADOL) 15 MG/ML injection 15 mg (15 mg Intravenous Given 03/13/24 0833)  potassium chloride SA (KLOR-CON M) CR tablet 40 mEq (40 mEq Oral Given 03/13/24 0900)  buprenorphine-naloxone (SUBOXONE) 8-2 mg per SL tablet 1 tablet (1 tablet Sublingual Given 03/13/24 0900)    ED Course/ Medical Decision Making/ A&P                                 Medical Decision Making Amount and/or Complexity of Data Reviewed Labs: ordered.  Risk Prescription drug management.   63 year old male with a history of substance-induced mood disorder, polysubstance dependence, anxiety, depression, PTSD, and hypertension here with concern for opiate withdrawal.  Vitals notable for hypertension, tachycardia, and tachypnea.  Exam with the same as well as diaphoresis without focal pulmonary findings.  EKG with sinus tachycardia and probable LVH.  He is at his baseline anemia.  UDS is positive for opiates.  UA and lipase normal.  K3.4.  Replete potassium, likely in setting of decreased intake.  Also give 1 L bolus and 1 dose Zofran.  15 mg Toradol also given for acute discomfort. Will also give 1 dose Suboxone to help with withdrawal symptoms.  On reassessment, patient reports feeling better.   He is less tachycardic, less diaphoretic, and overall appears in better spirits. He would be interested in continuing Suboxone until he can get his next prescription of oxycodone with his primary.  Will provide 15 days of Suboxone daily along with Zofran to be used as needed for nausea.  Advised to follow-up closely with PCP.  Patient understanding and appreciative.        Final Clinical Impression(s) / ED Diagnoses Final diagnoses:  Nausea vomiting and diarrhea    Rx / DC Orders ED Discharge Orders          Ordered  buprenorphine-naloxone (SUBOXONE) 8-2 mg SUBL SL tablet  Daily        03/13/24 1013    ondansetron (ZOFRAN-ODT) 4 MG disintegrating tablet  Every 8 hours PRN        03/13/24 1013              Evette Georges, MD 03/13/24 1015    [name], DO 03/13/24 1026

## 2024-03-13 NOTE — Discharge Instructions (Addendum)
 You were treated for withdrawal symptoms with fluids, zofran for nausea, and suboxone. I have prescribed zofran and suboxone to get you to your next appointment with your doctor for more oxycodone. DO NOT take the suboxone and oxycodone together if you have any leftover. Be sure to follow up with your primary doctor.

## 2024-03-13 NOTE — ED Notes (Signed)
 Patient discharged by this RN. Patient verbalizes understanding of instructions with no additional questions for this RN.

## 2024-03-13 NOTE — ED Triage Notes (Signed)
 Pt. Stated, Im having withdrawals from Oxycodone , my last dose was Sunday but Im having flu like symptoms like I have COVID. , congestion, sneezing, coughing, N/V, and stomach pain. They symptoms started early Monday morning.

## 2024-03-13 NOTE — ED Notes (Signed)
 MD at bedside.

## 2024-03-16 ENCOUNTER — Encounter: Payer: Self-pay | Admitting: Gastroenterology

## 2024-03-21 ENCOUNTER — Ambulatory Visit: Admitting: Family

## 2024-03-29 ENCOUNTER — Telehealth: Payer: Self-pay | Admitting: Orthopedic Surgery

## 2024-03-29 NOTE — Telephone Encounter (Signed)
 Pt called requesting a phone call back in regards to knowing if the fax was received or not.

## 2024-03-29 NOTE — Telephone Encounter (Signed)
 I called and advised him to have the Dr to refax the lab results as we have not not rec'd them.

## 2024-03-29 NOTE — Telephone Encounter (Signed)
 Pt states he did not get the lab work done at the place Dr Christell Constant was sending him to have it done. [facility] did the lab work instead and pt wants Dr Christell Constant to get the results from [place]. Pt's call back number [phone]

## 2024-03-29 NOTE — Telephone Encounter (Signed)
 I called and spoke with patient, I advised that he needs to have [facility] to send the results over to [place] of his results

## 2024-03-29 NOTE — Telephone Encounter (Signed)
 Disregard

## 2024-05-02 ENCOUNTER — Ambulatory Visit (INDEPENDENT_AMBULATORY_CARE_PROVIDER_SITE_OTHER): Admitting: Orthopedic Surgery

## 2024-05-02 ENCOUNTER — Other Ambulatory Visit (INDEPENDENT_AMBULATORY_CARE_PROVIDER_SITE_OTHER)

## 2024-05-02 DIAGNOSIS — M542 Cervicalgia: Secondary | ICD-10-CM

## 2024-05-02 NOTE — Progress Notes (Signed)
 Orthopedic Spine Surgery Office Note   Assessment: Patient is a 63 y.o. male with significant neck pain and head ptosis. Deformity appears flexible as it corrects on supine lateral films     Plan: -The only lab that was elevated was his ESR and Alk phos. The remaining labs were within normal limits  -Referred to neurology to rule out secondary causes of dropped head syndrome -If there is no underlying secondary causes, I told him that his option would be to work on neck extensors and stretching or to perform a posterior cervicothoracic fusion from C2-T2 -Patient has tried Tylenol , narcotics, activity modification -Patient should return to office in 4 weeks, x-rays at next visit: none     Patient expressed understanding of the plan and all questions were answered to the patient's satisfaction.    ___________________________________________________________________________     History:   Patient is a 63 y.o. male who presents today for follow up on his cervical spine. Patient continues to have neck pain and weakness. He has difficulty holding his head up. He has no radiating arm pain. Pain is worse as the day gets worse.     Treatments tried: Tylenol , narcotics, activity modification    Physical Exam:   General: no acute distress, appears stated age Neurologic: alert, answering questions appropriately, following commands Respiratory: unlabored breathing on room air, symmetric chest rise Psychiatric: appropriate affect, normal cadence to speech     MSK (spine):   -Strength exam                                                   Left                  Right Grip strength                5/5                  5/5 Interosseus                  5/5                  5/5 Wrist extension            5/5                  5/5 Wrist flexion                 5/5                  5/5 Elbow flexion                5/5                  5/5 Deltoid                          5/5                  5/5    EHL                              5/5                  5/5 TA  5/5                  5/5 GSC                             5/5                  5/5 Knee extension            5/5                  5/5 Hip flexion                    5/5                  5/5   -Sensory exam                           Sensation intact to light touch in L3-S1 nerve distributions of bilateral lower extremities             Sensation intact to light touch in C5-T1 nerve distributions of bilateral upper extremities   -Positive sagittal balance at the cervical spine on inspection.  Patient seem to be having difficulty maintaining horizontal gaze with dropped head posture   I reviewed the ESR, CRP, MUSK, Alk phos, A1c, creatinine kinase, ACh antibodies, thyroid  panel  Imaging: XRs of the cervical spine from 01/26/2024 were previously independently reviewed and interpreted, showing C2 slope of 40 degrees.  Cervical SVA of 5.2 cm.  Disc height loss with anterior osteophyte formation at C4/5, C5/6, C6/7.  No evidence of instability on flexion/extension views.  No fracture or dislocation seen.   XR of the cervical spine from 01/26/2024 was previously independently reviewed and interpreted, showing correction of prior cervical kyphosis with a neutral alignment.   XR scoliosis from 05/02/2024 was independently reviewed and interpreted, showing PI of 59, LL of 52. Negative sagittal balance of 2cm. No significant coronal imbalance.   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  Left-sided foraminal stenosis at C2/3.  Bilateral foraminal stenosis at C3/4.  Right-sided foraminal stenosis at C4/5.  No T2 cord signal change seen.     Patient name: Sean Mccoy Patient MRN: [id number] Date of visit: 05/02/24

## 2024-05-09 ENCOUNTER — Ambulatory Visit: Admitting: Gastroenterology

## 2024-05-09 NOTE — Progress Notes (Deleted)
 Sean Mccoy

## 2024-06-06 ENCOUNTER — Ambulatory Visit: Payer: Medicare (Managed Care) | Admitting: Orthopedic Surgery

## 2024-07-23 ENCOUNTER — Emergency Department (HOSPITAL_COMMUNITY): Payer: Medicare (Managed Care)

## 2024-07-23 ENCOUNTER — Other Ambulatory Visit: Payer: Self-pay

## 2024-07-23 ENCOUNTER — Encounter (HOSPITAL_COMMUNITY): Payer: Self-pay | Admitting: Emergency Medicine

## 2024-07-23 ENCOUNTER — Emergency Department (HOSPITAL_COMMUNITY)
Admission: EM | Admit: 2024-07-23 | Discharge: 2024-07-23 | Disposition: A | Discharge status: Home / Self Care | Payer: Medicare (Managed Care) | Attending: Emergency Medicine | Admitting: Emergency Medicine

## 2024-07-23 DIAGNOSIS — M542 Cervicalgia: Secondary | ICD-10-CM | POA: Diagnosis present

## 2024-07-23 DIAGNOSIS — M47812 Spondylosis without myelopathy or radiculopathy, cervical region: Secondary | ICD-10-CM

## 2024-07-23 DIAGNOSIS — X500XXA Overexertion from strenuous movement or load, initial encounter: Secondary | ICD-10-CM | POA: Insufficient documentation

## 2024-07-23 DIAGNOSIS — S161XXA Strain of muscle, fascia and tendon at neck level, initial encounter: Secondary | ICD-10-CM | POA: Insufficient documentation

## 2024-07-23 DIAGNOSIS — M4692 Unspecified inflammatory spondylopathy, cervical region: Secondary | ICD-10-CM | POA: Insufficient documentation

## 2024-07-23 MED ORDER — CYCLOBENZAPRINE HCL 10 MG PO TABS
5.0000 mg | ORAL_TABLET | Freq: Once | ORAL | Status: AC
  Administered 2024-07-23: 5 mg via ORAL
  Filled 2024-07-23: qty 1

## 2024-07-23 MED ORDER — CYCLOBENZAPRINE HCL 5 MG PO TABS: 5.0000 mg | ORAL_TABLET | Freq: Three times a day (TID) | ORAL | 0 refills | Status: AC | PRN

## 2024-07-23 MED ORDER — PREDNISONE 20 MG PO TABS: ORAL_TABLET | ORAL | 0 refills | Status: AC

## 2024-07-23 NOTE — ED Provider Notes (Signed)
  EMERGENCY DEPARTMENT AT [facility] Provider Note   CSN: [id number] Arrival date & time: 07/23/24  1215     Patient presents with: Neck Pain   Sean Mccoy is a 63 y.o. male.  With a history of chronic neck pain who presents to the ED for acute neck pain.  Patient was attempting to lift his air conditioning unit last night when he injured his neck.  Since then has had pain on both sides of his neck.  Feels like it is difficult to keep his neck straight due to discomfort.  No pain weakness or numbness in the arms.  No other injuries.    Neck Pain      Prior to Admission medications   Medication Sig Start Date End Date Taking? Authorizing Provider  acetaminophen  (TYLENOL ) 325 MG tablet Take 2 tablets (650 mg total) by mouth every 6 (six) hours as needed for mild pain (pain score 1-3), fever or headache. 10/20/23   Samtani, Jai-Gurmukh, MD  amLODipine  (NORVASC ) 5 MG tablet Take 1 tablet (5 mg total) by mouth daily. 10/21/23   Samtani, Jai-Gurmukh, MD  atorvastatin (LIPITOR) 40 MG tablet Take 40 mg by mouth at bedtime.    [provider]  furosemide  (LASIX ) 20 MG tablet Take 1 tablet (20 mg total) by mouth daily. Use this medication and take it if you gain more than 2 pounds in a 24 hours perido of time and /or if you feel like you are accumutaing fluid in your lower extremities 10/20/23   Samtani, Jai-Gurmukh, MD  lisinopril -hydrochlorothiazide  (ZESTORETIC ) 20-25 MG tablet Take 1 tablet by mouth daily.    [provider]  magnesium  oxide (MAG-OX) 400 (240 Mg) MG tablet Take 800 mg by mouth daily.    [provider]  ondansetron  (ZOFRAN -ODT) 4 MG disintegrating tablet Take 1 tablet (4 mg total) by mouth every 8 (eight) hours as needed for nausea or vomiting. 03/13/24   Tharon Lung, MD  oxyCODONE  (OXY IR/ROXICODONE ) 5 MG immediate release tablet Take 2 tablets (10 mg total) by mouth 3 (three) times daily. 10/20/23   Samtani, Jai-Gurmukh, MD   pregabalin (LYRICA) 50 MG capsule Take 50 mg by mouth 2 (two) times daily.    [provider]  tamsulosin (FLOMAX) 0.4 MG CAPS capsule Take 0.4 mg by mouth in the morning.    [provider]  terbinafine  (LAMISIL ) 250 MG tablet Take 1 tablet (250 mg total) by mouth daily. 01/04/24   Sean Rocky SAUNDERS, NP    Allergies: Patient has no known allergies.    Review of Systems  Musculoskeletal:  Positive for neck pain.    Updated Vital Signs BP (!) 177/85 (BP Location: Right Arm)   Pulse (!) 104   Temp 98.1 F (36.7 C)   Resp 18   Ht 5' 10 (1.778 m)   Wt 90.7 kg   SpO2 97%   BMI 28.70 kg/m   Physical Exam Vitals and nursing note reviewed.  HENT:     Head: Normocephalic and atraumatic.  Eyes:     Pupils: Pupils are equal, round, and reactive to light.  Neck:     Comments: Bilateral paraspinal tenderness with no midline tender step-off deformity Cardiovascular:     Rate and Rhythm: Normal rate and regular rhythm.  Pulmonary:     Effort: Pulmonary effort is normal.     Breath sounds: Normal breath sounds.  Abdominal:     Palpations: Abdomen is soft.     Tenderness: There  is no abdominal tenderness.  Musculoskeletal:     Cervical back: Neck supple.     Comments: 505 motor strength with sensation intact to light touch throughout bilateral upper and lower extremities 2+ radial pulse bilaterally  Skin:    General: Skin is warm and dry.  Neurological:     Mental Status: He is alert.  Psychiatric:        Mood and Affect: Mood normal.     (all labs ordered are listed, but only abnormal results are displayed) Labs Reviewed - No data to display  EKG: None  Radiology: No results found.   Procedures   Medications Ordered in the ED  cyclobenzaprine  (FLEXERIL ) tablet 5 mg (5 mg Oral Given 07/23/24 1459)    Clinical Course as of 07/23/24 1545  Mon Jul 23, 2024  1545 I, [name] DO, am transitioning care of this patient to the oncoming provider pending  CT C-spine reevaluation and disposition [MP]    Clinical Course User Index [MP] Marine Ozell LABOR, DO                                 Medical Decision Making 63 year old male with history as above presenting for acute on chronic neck pain.  Attempted to lift heavy AC unit.  His oxycodone  at home was ineffective and prior to providing pain relief.  No motor/sensory/circulatory deficits of the upper extremities.  Low suspicion for severe neurovascular or spinal cord injury.  Will obtain CT C-spine to look for any evidence of acute osseous trauma and give a dose of Flexeril  here.  Amount and/or Complexity of Data Reviewed Radiology: ordered.  Risk Prescription drug management.        Final diagnoses:  Acute strain of neck muscle, initial encounter    ED Discharge Orders     None          Marine Ozell LABOR, DO 07/23/24 1546

## 2024-07-23 NOTE — ED Notes (Signed)
 Pt. Expressed desires to go home; I reviewed his discharge paperwork and he understood, pt. Was in good spirits leaving.

## 2024-07-23 NOTE — ED Provider Notes (Signed)
  Physical Exam  BP (!) 177/85 (BP Location: Right Arm)   Pulse (!) 104   Temp 98.1 F (36.7 C)   Resp 18   Ht 5' 10 (1.778 m)   Wt 90.7 kg   SpO2 97%   BMI 28.70 kg/m   Physical Exam  Procedures  Procedures  ED Course / MDM   Clinical Course as of 07/23/24 1623  Mon Jul 23, 2024  1545 I, [name] DO, am transitioning care of this patient to the oncoming provider pending CT C-spine reevaluation and disposition [MP]    Clinical Course User Index [MP] Marine Ozell LABOR, DO   Medical Decision Making Care assumed at 3 PM.  Patient is here with neck pain after heavy lifting.  Patient has chronic pain and is on oxycodone  at baseline.  Signout pending CT cervical spine and if negative, anticipate patient can be discharged  4:24 PM I reviewed patient's CT scan and did not show any acute changes.  Patient does have degenerative changes.  Patient's pain is under control now.  Will prescribe a course of steroids as well as muscle relaxant.  I told patient that he is on chronic pain meds and if he needs more he need to talk to his pain management doctor.  Will refer to spine doctor for possible injections in further evaluation  Problems Addressed: Acute strain of neck muscle, initial encounter: acute illness or injury Neck arthritis: acute illness or injury  Amount and/or Complexity of Data Reviewed Radiology: ordered and independent interpretation performed. Decision-making details documented in ED Course.  Risk Prescription drug management.          Patt Alm Macho, MD 07/23/24 [phone]

## 2024-07-23 NOTE — ED Notes (Signed)
 Patient transported to CT

## 2024-07-23 NOTE — ED Triage Notes (Signed)
 POV/ ambulatory/ c/o neck pain x 1 year/ pt states he injured last night by lifting something heavy, causing worsening pain/ A&OX4

## 2024-07-23 NOTE — Discharge Instructions (Addendum)
 As we discussed, you do have arthritis in your neck causing your symptoms  I have prescribed prednisone  to help with the inflammation  Please continue oxycodone  as prescribed by your doctor  I have also prescribed Flexeril  as needed for spasms  Please call [facility] for follow-up  Return to ER if you have worse neck pain or numbness or weakness

## 2024-08-06 ENCOUNTER — Ambulatory Visit (INDEPENDENT_AMBULATORY_CARE_PROVIDER_SITE_OTHER): Payer: Medicare (Managed Care) | Admitting: Family Medicine

## 2024-08-06 VITALS — BP 152/70 | Ht 70.0 in | Wt 190.0 lb

## 2024-08-06 DIAGNOSIS — G8929 Other chronic pain: Secondary | ICD-10-CM | POA: Diagnosis not present

## 2024-08-06 DIAGNOSIS — M25572 Pain in left ankle and joints of left foot: Secondary | ICD-10-CM

## 2024-08-06 DIAGNOSIS — M25571 Pain in right ankle and joints of right foot: Secondary | ICD-10-CM

## 2024-08-06 DIAGNOSIS — M25552 Pain in left hip: Secondary | ICD-10-CM

## 2024-08-06 NOTE — Patient Instructions (Addendum)
 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 Arch supports are very important for this condition. Avoid flat shoes, barefoot walking. If you wear sandals they need to have more arch support. When you put the green insoles in shoes take the ones that came with the shoes out first. Voltaren  gel up to 4 times a day topically. If this isn't helping enough you can try aleve  twice a day with food. Follow up with me in 1 month for reevaluation.

## 2024-08-06 NOTE — Progress Notes (Signed)
 s  Bilateral feet/ankles: Significant pes planus. Notable swelling bilateral feet and ankles.  Dressings present left distal medial foot. No erythema. Tender to palpation of sinus tarsi.  No bony tenderness, peroneal, base 5th metatarsal tenderness. Very limited ROM all planes of bilateral ankles.   Assessment & Plan:  1. 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  Soft tissue swelling as well.  Stressed importance of arch supports - sports insoles provided with scaphoid pads.  Icing, voltaren  gel, elevation.  Consider aleve  twice a day.  He inquired about injections but advised this would not provide long term benefit and too great of risk with wound issue and history of cellulitis.  Follow up in 1 month.

## 2024-08-07 ENCOUNTER — Encounter: Payer: Self-pay | Admitting: Family Medicine

## 2024-08-07 ENCOUNTER — Encounter: Payer: Self-pay | Admitting: Neurology

## 2024-08-07 ENCOUNTER — Ambulatory Visit (INDEPENDENT_AMBULATORY_CARE_PROVIDER_SITE_OTHER): Payer: Medicare (Managed Care) | Admitting: Neurology

## 2024-08-07 VITALS — BP 111/76 | HR 116 | Ht 70.0 in | Wt 224.0 lb

## 2024-08-07 DIAGNOSIS — M5382 Other specified dorsopathies, cervical region: Secondary | ICD-10-CM | POA: Diagnosis not present

## 2024-08-07 DIAGNOSIS — M4802 Spinal stenosis, cervical region: Secondary | ICD-10-CM | POA: Diagnosis not present

## 2024-08-07 DIAGNOSIS — R29898 Other symptoms and signs involving the musculoskeletal system: Secondary | ICD-10-CM | POA: Diagnosis not present

## 2024-08-07 NOTE — Progress Notes (Signed)
 [facility] Neurology Division Clinic Note - Initial Visit   Date: 08/07/2024   Datron Brakebill MRN: [id number] DOB: 11-16-1961   Dear Dr. Georgina:  Thank you for your kind referral of Jadarius Commons for consultation of head drop. Although his history is well known to you, please allow us  to reiterate it for the purpose of our medical record. The patient was accompanied to the clinic by self.    Dalessandro Baldyga is a 63 y.o. 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   IMPRESSION/PLAN: Progressive head drop with associated neck pain, most likely due to cervical central canal stenosis at C2-3 and C3-4.  His exam does not suggest motor neuron disease, myasthenia gravis, or myopathy.  Prior testing shows negative antibody for myasthenia gravis and normal CK, making myopathy less likely.  I recommend NCS/EMG of the arms with attention to paraspinal muscles to further evaluate symptoms and especially look for myopathy (inclusion body myositis).  If EMG does not show myopathy, then I will have him follow-up with Dr. Georgina.    ------------------------------------------------------------- History of present illness: Starting in late 2024, he began having progressive difficulty with keeping his head upright.  He recalls having neck pain for several months prior to this.  He feels that he is able to extend the neck to look upright but after a few minutes, it tends to assumed a flexed position.  When watching TV, he tends to prop his head up on his hand.  He feels that his head is being pulled down towards the ground. He has neck pain when he tries to extend the neck.  He denies weakness with speech/swallow, no weakness of the arms or legs.  No numbness/tingling.  No cramps or muscle twitches.    Prior work-up has included testing for myasthenia gravis (AChR blocking and MuSK antibody negative), screening for myopathy (normal  CK), and MRI cervical spine shows spinal canal stenosis at C2-3 and C3-4, as well as biforaminal stenosis at C3-4. He reports having history of doing heavy lifting when he was younger.    He is currently on disability since 2023.    Out-side paper records, electronic medical record, and images have been reviewed where available and summarized as:  Labs 3/5/02025:  CK 133, TSH 2.86, CRP 3, HbA1c 5.2, ESR 114, AChR blocking antibody neg, MuSK antibody neg  MRI brain wwo contrast 11/18/2023: 1. A 23 mm dural-based extra-axial lesion in the left frontal region, most consistent with a meningioma. 2. Area of encephalomalacia and gliosis with associated hemosiderin deposit in the left occipital lobe related to prior hemorrhagic contusion. 3. Chronic bilateral thalamic infarcts. 4. Scattered foci of T2 hyperintensity within the white matter of the cerebral hemispheres and within the pons, nonspecific but may represent mild chronic microvascular ischemic changes.   MRI cervical spine 11/18/2023: 1. Degenerative changes of the cervical spine with moderate spinal canal stenosis at C2-3 and C3-4. 2. Multilevel neural foraminal narrowing, as described above. 3. Marrow edema at the C2-3 right facet joint, likely degenerative.     Lab Results  Component Value Date   HGBA1C 5.2 10/17/2023   Lab Results  Component Value Date   VITAMINB12 347 10/17/2023   Lab Results  Component Value Date   TSH 0.847 05/19/2018   Lab Results  Component Value Date   ESRSEDRATE 24 (H) 10/16/2023    Past Medical History:  Diagnosis Date   Abrasion of face 11/06/2018  Anxiety    COVID-19    Depression    Enlarged prostate    HTN (hypertension) 05/04/2019   Orbital floor fracture (HCC) 11/06/2018   right   PTSD (post-traumatic stress disorder)    Rheumatoid arthritis (HCC)     Past Surgical History:  Procedure Laterality Date   KNEE ARTHROSCOPY Right    ORIF ORBITAL FRACTURE Right 11/22/2018    Procedure: OPEN REDUCTION INTERNAL FIXATION (ORIF) RIGHT TRIPOD FRACTURE;  Surgeon: Lowery Estefana RAMAN, DO;  Location: [facility];  Service: Plastics;  Laterality: Right;   TOTAL HIP ARTHROPLASTY Right 01/20/2016   Procedure: RIGHT TOTAL HIP ARTHROPLASTY ANTERIOR APPROACH;  Surgeon: Lonni CINDERELLA Poli, MD;  Location: [facility] OR;  Service: Orthopedics;  Laterality: Right;     Medications:  Outpatient Encounter Medications as of 08/07/2024  Medication Sig   acetaminophen  (TYLENOL ) 325 MG tablet Take 2 tablets (650 mg total) by mouth every 6 (six) hours as needed for mild pain (pain score 1-3), fever or headache.   amLODipine  (NORVASC ) 5 MG tablet Take 1 tablet (5 mg total) by mouth daily.   atorvastatin (LIPITOR) 40 MG tablet Take 40 mg by mouth at bedtime.   cyclobenzaprine  (FLEXERIL ) 5 MG tablet Take 1 tablet (5 mg total) by mouth 3 (three) times daily as needed.   furosemide  (LASIX ) 20 MG tablet Take 1 tablet (20 mg total) by mouth daily. Use this medication and take it if you gain more than 2 pounds in a 24 hours perido of time and /or if you feel like you are accumutaing fluid in your lower extremities   lisinopril -hydrochlorothiazide  (ZESTORETIC ) 20-25 MG tablet Take 1 tablet by mouth daily.   magnesium  oxide (MAG-OX) 400 (240 Mg) MG tablet Take 800 mg by mouth daily.   ondansetron  (ZOFRAN -ODT) 4 MG disintegrating tablet Take 1 tablet (4 mg total) by mouth every 8 (eight) hours as needed for nausea or vomiting.   oxyCODONE  (OXY IR/ROXICODONE ) 5 MG immediate release tablet Take 2 tablets (10 mg total) by mouth 3 (three) times daily.   pregabalin (LYRICA) 50 MG capsule Take 50 mg by mouth 2 (two) times daily.   tamsulosin (FLOMAX) 0.4 MG CAPS capsule Take 0.4 mg by mouth in the morning.   terbinafine  (LAMISIL ) 250 MG tablet Take 1 tablet (250 mg total) by mouth daily. (Patient not taking: Reported on 08/07/2024)   No facility-administered encounter medications on file as of  08/07/2024.    Allergies: Not on File  Family History: Family History  Problem Relation Age of Onset   Diabetes Mother    Hypertension Mother    Diabetes Father    Hypertension Father     Social History: Social History   Tobacco Use   Smoking status: Never   Smokeless tobacco: Never  Vaping Use   Vaping status: Never Used  Substance Use Topics   Alcohol use: Yes    Comment: occasionally   Drug use: No   Social History   Social History Narrative   Right handed   Lives alone    Vital Signs:  BP 111/76 (BP Location: Left Arm, Patient Position: Sitting)   Pulse (!) 116   Ht 5' 10 (1.778 m)   Wt 224 lb (101.6 kg)   BMI 32.14 kg/m   Neurological Exam: MENTAL STATUS including orientation to time, place, person, recent and remote memory, attention span and concentration, language, and fund of knowledge is normal.  Speech is not dysarthric.  CRANIAL NERVES: II:  No visual field  defects.     III-IV-VI: Pupils equal round and reactive to light.  Normal conjugate, extra-ocular eye movements in all directions of gaze.  No nystagmus.  No ptosis.   V:  Normal facial sensation.    VII:  Normal facial symmetry and movements.   VIII:  Normal hearing and vestibular function.   IX-X:  Normal palatal movement.   XI:  Normal shoulder shrug and head rotation.   XII:  Normal tongue strength and range of motion, no deviation or fasciculation.  MOTOR:  No atrophy, fasciculations or abnormal movements. Marked lymphedema bilaterally.  No pronator drift.  Neck flexion is 5/5 Next extension is 4/5 Upper Extremity:  Right  Left  Deltoid  5/5   5/5   Biceps  5/5   5/5   Triceps 5/5  5/5  Wrist extensors  5/5   5/5   Wrist flexors  5/5   5/5   Finger extensors  5/5   5/5   Finger flexors  5/5   5/5   Dorsal interossei  5/5   5/5   Abductor pollicis  5/5   5/5   Tone (Ashworth scale)  0  0   Lower Extremity:  Right  Left  Hip flexors  5/5   5/5   Knee flexors  5/5   5/5   Knee  extensors  5/5   5/5   Dorsiflexors  5/5   5/5   Plantarflexors  5/5   5/5   Toe extensors  5/5   5/5   Toe flexors  5/5   5/5   Tone (Ashworth scale)  0  0   MSRs:                                           Right        Left brachioradialis 3+  3+  biceps 3+  3+  triceps 3+  3+  patellar 2+  2+  ankle jerk 2+  2+  Hoffman no  no  plantar response down  down   SENSORY:  Normal and symmetric perception of light touch, pinprick, vibration, and temperature.    COORDINATION/GAIT: Normal finger-to- nose-finger.  Intact rapid alternating movements bilaterally.  Wide-based gait, unassisted, stiff-appearing.     Thank you for allowing me to participate in patient's care.  If I can answer any additional questions, I would be pleased to do so.    Sincerely,    Kailie Polus K. Tobie, DO

## 2024-08-07 NOTE — Patient Instructions (Signed)
 Nerve testing of upper extremities  ELECTROMYOGRAM AND NERVE CONDUCTION STUDIES (EMG/NCS) INSTRUCTIONS  How to Prepare The neurologist conducting the EMG will need to know if you have certain medical conditions. Tell the neurologist and other EMG lab personnel if you: Have a pacemaker or any other electrical medical device Take blood-thinning medications Have hemophilia, a blood-clotting disorder that causes prolonged bleeding Bathing Take a shower or bath shortly before your exam in order to remove oils from your skin. Don't apply lotions or creams before the exam.  What to Expect You'll likely be asked to change into a hospital gown for the procedure and lie down on an examination table. The following explanations can help you understand what will happen during the exam.  Electrodes. The neurologist or a technician places surface electrodes at various locations on your skin depending on where you're experiencing symptoms. Or the neurologist may insert needle electrodes at different sites depending on your symptoms.  Sensations. The electrodes will at times transmit a tiny electrical current that you may feel as a twinge or spasm. The needle electrode may cause discomfort or pain that usually ends shortly after the needle is removed. If you are concerned about discomfort or pain, you may want to talk to the neurologist about taking a short break during the exam.  Instructions. During the needle EMG, the neurologist will assess whether there is any spontaneous electrical activity when the muscle is at rest - activity that isn't present in healthy muscle tissue - and the degree of activity when you slightly contract the muscle.  He or she will give you instructions on resting and contracting a muscle at appropriate times. Depending on what muscles and nerves the neurologist is examining, he or she may ask you to change positions during the exam.  After your EMG You may experience some temporary,  minor bruising where the needle electrode was inserted into your muscle. This bruising should fade within several days. If it persists, contact your primary care doctor.

## 2024-09-28 ENCOUNTER — Encounter: Payer: Medicare (Managed Care) | Admitting: Neurology

## 2024-10-23 ENCOUNTER — Encounter: Payer: Medicare (Managed Care) | Admitting: Neurology

## 2024-11-09 ENCOUNTER — Emergency Department (HOSPITAL_COMMUNITY): Payer: Medicare (Managed Care)

## 2024-11-09 ENCOUNTER — Other Ambulatory Visit: Payer: Self-pay

## 2024-11-09 ENCOUNTER — Encounter (HOSPITAL_COMMUNITY): Payer: Self-pay

## 2024-11-09 ENCOUNTER — Emergency Department (HOSPITAL_COMMUNITY)
Admission: EM | Admit: 2024-11-09 | Discharge: 2024-11-10 | Disposition: A | Discharge status: Home / Self Care | Payer: Medicare (Managed Care)

## 2024-11-09 DIAGNOSIS — M79604 Pain in right leg: Secondary | ICD-10-CM | POA: Insufficient documentation

## 2024-11-09 DIAGNOSIS — Z79899 Other long term (current) drug therapy: Secondary | ICD-10-CM | POA: Diagnosis not present

## 2024-11-09 DIAGNOSIS — I10 Essential (primary) hypertension: Secondary | ICD-10-CM | POA: Insufficient documentation

## 2024-11-09 DIAGNOSIS — M79674 Pain in right toe(s): Secondary | ICD-10-CM

## 2024-11-09 DIAGNOSIS — R220 Localized swelling, mass and lump, head: Secondary | ICD-10-CM

## 2024-11-09 DIAGNOSIS — T7840XA Allergy, unspecified, initial encounter: Secondary | ICD-10-CM | POA: Insufficient documentation

## 2024-11-09 DIAGNOSIS — E119 Type 2 diabetes mellitus without complications: Secondary | ICD-10-CM | POA: Diagnosis not present

## 2024-11-09 LAB — COMPREHENSIVE METABOLIC PANEL WITH GFR
ALT: 19 U/L (ref 0–44)
AST: 34 U/L (ref 15–41)
Albumin: 3.4 g/dL — ABNORMAL LOW (ref 3.5–5.0)
Alkaline Phosphatase: 89 U/L (ref 38–126)
Anion gap: 13 (ref 5–15)
BUN: 22 mg/dL (ref 8–23)
CO2: 20 mmol/L — ABNORMAL LOW (ref 22–32)
Calcium: 9.2 mg/dL (ref 8.9–10.3)
Chloride: 99 mmol/L (ref 98–111)
Creatinine, Ser: 1.21 mg/dL (ref 0.61–1.24)
GFR, Estimated: >60 mL/min (ref >60)
Glucose, Bld: 94 mg/dL (ref 70–99)
Potassium: 4.2 mmol/L (ref 3.5–5.1)
Sodium: 132 mmol/L — ABNORMAL LOW (ref 135–145)
Total Bilirubin: 0.6 mg/dL (ref 0.0–1.2)
Total Protein: 7.8 g/dL (ref 6.5–8.1)

## 2024-11-09 LAB — CBC WITH DIFFERENTIAL/PLATELET
Abs Immature Granulocytes: 0.01 K/uL (ref 0.00–0.07)
Basophils Absolute: 0 K/uL (ref 0.0–0.1)
Basophils Relative: 0 %
Eosinophils Absolute: 0.1 K/uL (ref 0.0–0.5)
Eosinophils Relative: 3 %
HCT: 33.3 % — ABNORMAL LOW (ref 39.0–52.0)
Hemoglobin: 10.9 g/dL — ABNORMAL LOW (ref 13.0–17.0)
Immature Granulocytes: 0 %
Lymphocytes Relative: 26 %
Lymphs Abs: 1.2 K/uL (ref 0.7–4.0)
MCH: 31.5 pg (ref 26.0–34.0)
MCHC: 32.7 g/dL (ref 30.0–36.0)
MCV: 96.2 fL (ref 80.0–100.0)
Monocytes Absolute: 0.6 K/uL (ref 0.1–1.0)
Monocytes Relative: 13 %
Neutro Abs: 2.7 K/uL (ref 1.7–7.7)
Neutrophils Relative %: 58 %
Platelets: 191 K/uL (ref 150–400)
RBC: 3.46 MIL/uL — ABNORMAL LOW (ref 4.22–5.81)
RDW: 14.3 % (ref 11.5–15.5)
WBC: 4.7 K/uL (ref 4.0–10.5)
nRBC: 0 % (ref 0.0–0.2)

## 2024-11-09 LAB — I-STAT CG4 LACTIC ACID, ED: Lactic Acid, Venous: 2.1 mmol/L (ref 0.5–1.9)

## 2024-11-09 MED ORDER — DIPHENHYDRAMINE HCL 50 MG/ML IJ SOLN
INTRAMUSCULAR | Status: AC
  Filled 2024-11-09: qty 1

## 2024-11-09 MED ORDER — LACTATED RINGERS IV BOLUS
1000.0000 mL | Freq: Once | INTRAVENOUS | Status: AC
  Administered 2024-11-09: 1000 mL via INTRAVENOUS

## 2024-11-09 MED ORDER — SULFAMETHOXAZOLE-TRIMETHOPRIM 800-160 MG PO TABS: 1.0000 | ORAL_TABLET | Freq: Two times a day (BID) | ORAL | 0 refills | Status: AC

## 2024-11-09 MED ORDER — DIPHENHYDRAMINE HCL 25 MG PO CAPS
50.0000 mg | ORAL_CAPSULE | Freq: Once | ORAL | Status: AC
  Administered 2024-11-09: 50 mg via ORAL
  Filled 2024-11-09: qty 2

## 2024-11-09 MED ORDER — CEPHALEXIN 500 MG PO CAPS: 500.0000 mg | ORAL_CAPSULE | Freq: Four times a day (QID) | ORAL | 0 refills | Status: AC

## 2024-11-09 MED ORDER — SULFAMETHOXAZOLE-TRIMETHOPRIM 800-160 MG PO TABS
1.0000 | ORAL_TABLET | Freq: Once | ORAL | Status: AC
  Administered 2024-11-09: 1 via ORAL
  Filled 2024-11-09: qty 1

## 2024-11-09 MED ORDER — CEPHALEXIN 250 MG PO CAPS
500.0000 mg | ORAL_CAPSULE | Freq: Once | ORAL | Status: AC
  Administered 2024-11-09: 500 mg via ORAL
  Filled 2024-11-09: qty 2

## 2024-11-09 MED ORDER — OXYCODONE-ACETAMINOPHEN 5-325 MG PO TABS
1.0000 | ORAL_TABLET | Freq: Once | ORAL | Status: AC
  Administered 2024-11-09: 1 via ORAL
  Filled 2024-11-09: qty 1

## 2024-11-09 MED ORDER — MORPHINE SULFATE (PF) 4 MG/ML IV SOLN
4.0000 mg | Freq: Once | INTRAVENOUS | Status: AC
  Administered 2024-11-09: 4 mg via INTRAVENOUS
  Filled 2024-11-09: qty 1

## 2024-11-09 NOTE — ED Notes (Signed)
 Pt gives verbal consent for mse

## 2024-11-09 NOTE — ED Triage Notes (Signed)
 PT reports he has infection to his right foot that has been ongoing for months.

## 2024-11-09 NOTE — ED Provider Notes (Cosign Needed Addendum)
  EMERGENCY DEPARTMENT AT [facility] Provider Note   CSN: [id number] Arrival date & time: 11/09/24  1410     Patient presents with: No chief complaint on file.   Sean Mccoy is a 63 y.o. male with past medical history of SDH, HTN, borderline diabetes presents to Emergency Department for evaluation of right foot pain for past year that has worsened over the past month. Had been to wound care for this wound but has not been in past couple months and has been changing his dressings on his own at home. Was admitted on 10/14/23 for RLE cellulitis, osteomyelitis.  Reports that he went to his PCP today who recommended ED evaluation for infection.  Denies fevers, chills, drainage from foot   HPI     Prior to Admission medications   Medication Sig Start Date End Date Taking? Authorizing Provider  cephALEXin  (KEFLEX ) 500 MG capsule Take 1 capsule (500 mg total) by mouth 4 (four) times daily for 7 days. 11/09/24 11/16/24 Yes Kimora Stankovic E, PA  sulfamethoxazole-trimethoprim (BACTRIM DS) 800-160 MG tablet Take 1 tablet by mouth 2 (two) times daily for 7 days. 11/09/24 11/16/24 Yes Minnie Tinnie BRAVO, PA  acetaminophen  (TYLENOL ) 325 MG tablet Take 2 tablets (650 mg total) by mouth every 6 (six) hours as needed for mild pain (pain score 1-3), fever or headache. 10/20/23   Samtani, Jai-Gurmukh, MD  amLODipine  (NORVASC ) 5 MG tablet Take 1 tablet (5 mg total) by mouth daily. 10/21/23   Samtani, Jai-Gurmukh, MD  atorvastatin (LIPITOR) 40 MG tablet Take 40 mg by mouth at bedtime.    [provider]  cyclobenzaprine  (FLEXERIL ) 5 MG tablet Take 1 tablet (5 mg total) by mouth 3 (three) times daily as needed. 07/23/24   Patt Alm Macho, MD  furosemide  (LASIX ) 20 MG tablet Take 1 tablet (20 mg total) by mouth daily. Use this medication and take it if you gain more than 2 pounds in a 24 hours perido of time and /or if you feel like you are accumutaing fluid in your lower extremities  10/20/23   Samtani, Jai-Gurmukh, MD  lisinopril -hydrochlorothiazide  (ZESTORETIC ) 20-25 MG tablet Take 1 tablet by mouth daily.    [provider]  magnesium  oxide (MAG-OX) 400 (240 Mg) MG tablet Take 800 mg by mouth daily.    [provider]  ondansetron  (ZOFRAN -ODT) 4 MG disintegrating tablet Take 1 tablet (4 mg total) by mouth every 8 (eight) hours as needed for nausea or vomiting. 03/13/24   Tharon Lung, MD  oxyCODONE  (OXY IR/ROXICODONE ) 5 MG immediate release tablet Take 2 tablets (10 mg total) by mouth 3 (three) times daily. 10/20/23   Samtani, Jai-Gurmukh, MD  pregabalin (LYRICA) 50 MG capsule Take 50 mg by mouth 2 (two) times daily.    [provider]  tamsulosin (FLOMAX) 0.4 MG CAPS capsule Take 0.4 mg by mouth in the morning.    [provider]  terbinafine  (LAMISIL ) 250 MG tablet Take 1 tablet (250 mg total) by mouth daily. Patient not taking: Reported on 08/07/2024 01/04/24   Valdemar Rocky SAUNDERS, NP    Allergies: Patient has no known allergies.    Review of Systems  Skin:  Negative for color change.    Updated Vital Signs BP (!) 149/82   Pulse 87   Temp 98.4 F (36.9 C) (Oral)   Resp 18   SpO2 98%   Physical Exam Vitals and nursing note reviewed.  Constitutional:      General: He is not  in acute distress.    Appearance: Normal appearance.  HENT:     Head: Normocephalic and atraumatic.  Eyes:     Conjunctiva/sclera: Conjunctivae normal.  Cardiovascular:     Rate and Rhythm: Normal rate.     Pulses:          Dorsalis pedis pulses are detected w/ Doppler on the right side and detected w/ Doppler on the left side.  Pulmonary:     Effort: Pulmonary effort is normal. No respiratory distress.     Breath sounds: Normal breath sounds.  Musculoskeletal:     Right lower leg: Edema present.     Left lower leg: Edema present.  Feet:     Comments: Mild swelling and tenderness of right great and second toe. No discharge, warmth. See media Skin:     Coloration: Skin is not jaundiced or pale.  Neurological:     Mental Status: He is alert and oriented to person, place, and time. Mental status is at baseline.       (all labs ordered are listed, but only abnormal results are displayed) Labs Reviewed  COMPREHENSIVE METABOLIC PANEL WITH GFR - Abnormal; Notable for the following components:      Result Value   Sodium 132 (*)    CO2 20 (*)    Albumin 3.4 (*)    All other components within normal limits  CBC WITH DIFFERENTIAL/PLATELET - Abnormal; Notable for the following components:   RBC 3.46 (*)    Hemoglobin 10.9 (*)    HCT 33.3 (*)    All other components within normal limits  I-STAT CG4 LACTIC ACID, ED - Abnormal; Notable for the following components:   Lactic Acid, Venous 2.1 (*)    All other components within normal limits  CULTURE, BLOOD (ROUTINE X 2)  CULTURE, BLOOD (ROUTINE X 2)    EKG: None  Radiology: DG Foot Complete Right Result Date: 11/09/2024 CLINICAL DATA:  Right foot infection for months.  No fever. EXAM: RIGHT FOOT COMPLETE - 3+ VIEW COMPARISON:  10/15/2023 and MRI dated 10/17/2023. FINDINGS: Diffuse soft tissue swelling. Dorsal bandage material. Diffuse osteopenia. No fracture, bone erosion, periosteal reaction or soft tissue gas. Mild 1st MTP joint degenerative changes. IMPRESSION: 1. Diffuse soft tissue swelling. 2. No radiographic evidence of osteomyelitis. Electronically Signed   By: Elspeth Bathe M.D.   On: 11/09/2024 16:47     Medications Ordered in the ED  lactated ringers  bolus 1,000 mL (has no administration in time range)  diphenhydrAMINE  (BENADRYL ) capsule 50 mg (has no administration in time range)  oxyCODONE -acetaminophen  (PERCOCET/ROXICET) 5-325 MG per tablet 1 tablet (1 tablet Oral Given 11/09/24 1605)  morphine  (PF) 4 MG/ML injection 4 mg (4 mg Intravenous Given 11/09/24 2144)  cephALEXin  (KEFLEX ) capsule 500 mg (500 mg Oral Given 11/09/24 2243)  sulfamethoxazole-trimethoprim (BACTRIM DS)  800-160 MG per tablet 1 tablet (1 tablet Oral Given 11/09/24 2243)                                    Medical Decision Making Risk Prescription drug management.   Patient presents to the ED for concern of right toe pain, this involves an extensive number of treatment options, and is a complaint that carries with it a high risk of complications and morbidity.  The differential diagnosis includes osteomyelitis, cellulitis, fx, dislocation   Co morbidities that complicate the patient evaluation  Hx of osteomyelitis and cellulitis of  RLE   Additional history obtained:  Additional history obtained from Nursing, Outside Medical Records, and Past Admission   External records from outside source obtained and reviewed including triage RN note,   Lab Tests:  I Ordered, and personally interpreted labs.  The pertinent results include:   No leukocytosis nor elevated lactic   Imaging Studies ordered:  I ordered imaging studies including RLE XR  I independently visualized and interpreted imaging which showed  Diffuse soft tissue swelling. No radiographic evidence of osteomyelitis I agree with the radiologist interpretation    Medicines ordered and prescription drug management:  I ordered medication including Bactrim, Keflex , oxy, Benadryl  for infection prophylaxis, allergic reaction Reevaluation of the patient after these medicines showed that the patient improved I have reviewed the patients home medicines and have made adjustments as needed    Problem List / ED Course:  RLE pain Has been admitted for RLE cellulitis, osteomyelitis 1 year ago.  Per media, was significantly worse than it presents today with swelling, cellulitis, skin breakdown.  Has significantly improved since No warmth, erythema to 1st and 2nd digit on exam.  No complaints of fever, chills, drainage at home by patient.  He is mildly swollen and extremely tender on exam.   VS hemodynamically stable with no  fever nor tachycardia No leukocytosis. Lactic 2.1 - provided 1L fluid over 2 hrs. No documented hx of HF nor echo on file but does have chronic BLE edema Will cover with Keflex , Bactrim for infection Will have pt follow up with ortho who has been managing this   Allergic reaction Following me placing patient for DC, I am alerted by nursing staff that patient's upper lip is swollen.  He has no complaints of throat closing sensation, urticaria, shortness of breath.  He states this happens every so often.  He has no known triggers. Reports that swelling will spontaneously resolve and has never had anaphylaxis in past. No known allergies.  He has not ingested anything nor have I provided any medication since being in the emergency department. Had not taken abx  for RLE yet. Reports that he did have a bag of chips earlier prior to arrival and is unsure if he is ever had these before On exam, no wheezing, signs of respiratory distress.  Oxygen saturation WNL.  No urticaria on skin exam. no swelling of tongue. No difficulty swallowing. Passes swallow test with water. Provided him with Benadryl  p.o. for allergic reaction and observed him for an hour and a half with no worsening. Continues to not have SHOB, throat sx, nor urticaria. I individually assessed him prior to DC and he is well appearing with no signs of anaphylaxis, skin changes, nor wheezing. No signs of resp distress I had extensive discussion regarding return precautions. I am reassured that this has occurred in past and has never resulted in anaphylactic symptoms. Will sent epipen  as precaution as well as information regarding anaphylaxis symptoms, how to use epipen  in DC paperwork. Also provided allergy f/u   Reevaluation:  After the interventions noted above, I reevaluated the patient and found that they have :improved    Dispostion:  After consideration of the diagnostic results and the patients response to treatment, I feel that the  patent would benefit from outpatient management with ortho f/u.   Discussed ED workup, disposition, return to ED precautions with patient who expresses understanding agrees with plan.  All questions answered to their satisfaction.  They are agreeable to plan.  Discharge instructions provided on  paperwork  Final diagnoses:  Pain in right toe(s)    ED Discharge Orders          Ordered    cephALEXin  (KEFLEX ) 500 MG capsule  4 times daily        11/09/24 2230    sulfamethoxazole-trimethoprim (BACTRIM DS) 800-160 MG tablet  2 times daily        11/09/24 2230             Minnie Tinnie BRAVO, PA 11/10/24 0022    Minnie Tinnie BRAVO, PA 11/10/24 0032    Simon Lavonia SAILOR, MD 11/14/24 [phone]

## 2024-11-09 NOTE — Discharge Instructions (Addendum)
 Thank you for letting us  evaluate you today.  Your vital signs are within normal limits with no fever.  Your lab work does not show any acute infection or inflammation.  Your x-ray did not show any obvious bone infection.  We have given you a dose of antibiotics here Emergency Department.  Have also sent a prescription into your pharmacy.  Please take as prescribed and do not miss a dose even if you are feeling better.  Please follow-up with Dr. Harden, Ortho for further management of your foot, toes  I have also provided you with an EpiPen  for anaphylaxis.  This is when you have shortness of breath, throat closing sensation, itchy skin secondary to allergic reaction.  You only use this if you are having the symptoms and feel severely short of breath.  Following using this, please go to your local emergency department for evaluation and observation.  Please keep this EpiPen  with you at all times just in case of allergic reaction. Info in your paperwork on how and when to use this. Please review this. I have also provided you with allergy follow-up to test for what you are allergic to.  Please schedule an appointment with them  Return to Emergency Department if you experience significant worsening swelling, streaking of red, drainage, pus, fevers, chills to indicate infection

## 2024-11-09 NOTE — ED Triage Notes (Signed)
 Pr denies fevers; endorses pain to R foot

## 2024-11-09 NOTE — ED Notes (Signed)
 Poked patient twice; Only able to obtain first set of Blood Cultures; RN aware. KIT

## 2024-11-09 NOTE — ED Provider Triage Note (Signed)
 Emergency Medicine Provider Triage Evaluation Note  Sean Mccoy , a 63 y.o. male  was evaluated in triage.  Pt complains of foot infection. Was seen by PCP today for a regular check up.  PCP concerns of worsening R foot infection.  Pt endorse ongoing pain but denies fever, chills, or recent injury.  Report poor circulation and borderline DM  Review of Systems  Positive: As above Negative: As above  Physical Exam  BP (!) 142/69 (BP Location: Left Arm)   Pulse 83   Temp 97.8 F (36.6 C)   Resp 17   SpO2 97%  Gen:   Awake, no distress   Resp:  Normal effort  MSK:   Moves extremities without difficulty  Other:    Medical Decision Making  Medically screening exam initiated at 3:25 PM.  Appropriate orders placed.  Sean Mccoy was informed that the remainder of the evaluation will be completed by another provider, this initial triage assessment does not replace that evaluation, and the importance of remaining in the ED until their evaluation is complete.     Nivia Colon, PA-C 11/09/24 1526

## 2024-11-10 MED ORDER — EPINEPHRINE 0.3 MG/0.3ML IJ SOAJ: 0.3000 mg | INTRAMUSCULAR | 0 refills | Status: AC | PRN

## 2024-11-14 LAB — CULTURE, BLOOD (ROUTINE X 2)
Culture: NO GROWTH
Culture: NO GROWTH
Special Requests: ADEQUATE
Special Requests: ADEQUATE

## 2024-12-07 ENCOUNTER — Ambulatory Visit: Payer: Medicare (Managed Care) | Admitting: Neurology

## 2024-12-07 DIAGNOSIS — R29898 Other symptoms and signs involving the musculoskeletal system: Secondary | ICD-10-CM | POA: Diagnosis not present

## 2024-12-07 DIAGNOSIS — M5412 Radiculopathy, cervical region: Secondary | ICD-10-CM

## 2024-12-07 NOTE — Procedures (Signed)
°  [facility]  [address] [place], [address]  [place], [place] [address] Tel: [phone] Fax: [phone] Test Date:  12/07/2024  Patient: Sean Mccoy DOB: [birth year] Physician: Tonita Blanch, DO  Sex: Male Height: 5' 10 Ref Phys: Tonita Blanch, DO  ID#: [id number]   Technician:    History: This is a 63 year old man referred for evaluation of head drop and weakness.  NCV & EMG Findings: Extensive electrodiagnostic testing of the right upper extremity shows:  Right median and ulnar sensory responses are within normal limits. Right median and ulnar motor responses are within normal limits. Chronic motor axon loss changes are seen affecting the C5-C6 myotome.  Fibrillation potentials are seen in the lower cervical paraspinal muscles.  Impression: Active and chronic C5-C6 radiculopathy affecting the right upper extremity, mild. There is no evidence of a diffuse myopathy or widespread disorder of anterior horn cells.   ___________________________ Tonita Blanch, DO    Nerve Conduction Studies   Stim Site NR Peak (ms) Norm Peak (ms) O-P Amp (V) Norm O-P Amp  Right Median Anti Sensory (2nd Digit)  32 C  Wrist    3.0 <3.8 23.4 >10  Right Ulnar Anti Sensory (5th Digit)  32 C  Wrist    3.1 <3.2 15.7 >5     Stim Site NR Onset (ms) Norm Onset (ms) O-P Amp (mV) Norm O-P Amp Site1 Site2 Delta-0 (ms) Dist (cm) Vel (m/s) Norm Vel (m/s)  Right Median Motor (Abd Poll Brev)  32 C  Wrist    2.7 <4.0 12.4 >5 Elbow Wrist 6.8 35.0 51 >50  Elbow    9.5  11.3         Right Ulnar Motor (Abd Dig Minimi)  32 C  Wrist    2.8 <3.1 9.4 >7 B Elbow Wrist 4.2 25.0 60 >50  B Elbow    7.0  9.2  A Elbow B Elbow 1.8 10.0 56 >50  A Elbow    8.8  8.8          Electromyography   Side Muscle Ins.Act Fibs Fasc Recrt Amp Dur Poly Activation Comment  Right 1stDorInt Nml Nml Nml Nml Nml Nml Nml Nml N/A  Right PronatorTeres Nml Nml Nml Nml Nml Nml Nml Nml N/A  Right Biceps Nml Nml Nml *1- *1+ *1+  *1+ Nml N/A  Right Triceps Nml Nml Nml Nml Nml Nml Nml Nml N/A  Right Deltoid Nml Nml Nml *1- *1+ *1+ *1+ Nml N/A  Right Cervical Parasp Low Nml *1+ Nml Nml Nml Nml Nml Nml N/A      Waveforms:

## 2024-12-10 ENCOUNTER — Ambulatory Visit: Payer: Self-pay | Admitting: Neurology

## 2024-12-12 ENCOUNTER — Emergency Department (HOSPITAL_COMMUNITY)
Admission: EM | Admit: 2024-12-12 | Discharge: 2024-12-13 | Disposition: A | Discharge status: Home / Self Care | Payer: Medicare (Managed Care) | Source: Home / Self Care | Attending: Emergency Medicine | Admitting: Emergency Medicine

## 2024-12-12 DIAGNOSIS — R52 Pain, unspecified: Secondary | ICD-10-CM | POA: Diagnosis not present

## 2024-12-12 DIAGNOSIS — R110 Nausea: Secondary | ICD-10-CM | POA: Diagnosis not present

## 2024-12-12 DIAGNOSIS — Z760 Encounter for issue of repeat prescription: Secondary | ICD-10-CM | POA: Diagnosis present

## 2024-12-12 DIAGNOSIS — Z79899 Other long term (current) drug therapy: Secondary | ICD-10-CM | POA: Diagnosis not present

## 2024-12-12 DIAGNOSIS — R197 Diarrhea, unspecified: Secondary | ICD-10-CM | POA: Diagnosis not present

## 2024-12-12 DIAGNOSIS — G8929 Other chronic pain: Secondary | ICD-10-CM | POA: Diagnosis present

## 2024-12-13 ENCOUNTER — Other Ambulatory Visit: Payer: Self-pay

## 2024-12-13 ENCOUNTER — Encounter (HOSPITAL_COMMUNITY): Payer: Self-pay

## 2024-12-13 ENCOUNTER — Emergency Department (HOSPITAL_COMMUNITY)
Admission: EM | Admit: 2024-12-13 | Discharge: 2024-12-13 | Disposition: A | Discharge status: Home / Self Care | Payer: Medicare (Managed Care) | Attending: Emergency Medicine | Admitting: Emergency Medicine

## 2024-12-13 ENCOUNTER — Encounter (HOSPITAL_COMMUNITY): Payer: Self-pay | Admitting: *Deleted

## 2024-12-13 DIAGNOSIS — G8929 Other chronic pain: Secondary | ICD-10-CM | POA: Insufficient documentation

## 2024-12-13 DIAGNOSIS — R110 Nausea: Secondary | ICD-10-CM | POA: Insufficient documentation

## 2024-12-13 LAB — URINE DRUG SCREEN
Amphetamines: NEGATIVE
Barbiturates: NEGATIVE
Benzodiazepines: NEGATIVE
Cocaine: POSITIVE — AB
Fentanyl: NEGATIVE
Methadone Scn, Ur: NEGATIVE
Opiates: NEGATIVE
Tetrahydrocannabinol: NEGATIVE

## 2024-12-13 LAB — LIPASE, BLOOD: Lipase: 16 U/L (ref 11–51)

## 2024-12-13 LAB — URINALYSIS, ROUTINE W REFLEX MICROSCOPIC
Bacteria, UA: NONE SEEN
Bilirubin Urine: NEGATIVE
Glucose, UA: NEGATIVE mg/dL
Hgb urine dipstick: NEGATIVE
Ketones, ur: NEGATIVE mg/dL
Leukocytes,Ua: NEGATIVE
Nitrite: NEGATIVE
Protein, ur: 30 mg/dL — AB
Specific Gravity, Urine: 1.016 (ref 1.005–1.030)
pH: 5 (ref 5.0–8.0)

## 2024-12-13 LAB — COMPREHENSIVE METABOLIC PANEL WITH GFR
ALT: 17 U/L (ref 0–44)
AST: 48 U/L — ABNORMAL HIGH (ref 15–41)
Albumin: 4.1 g/dL (ref 3.5–5.0)
Alkaline Phosphatase: 123 U/L (ref 38–126)
Anion gap: 15 (ref 5–15)
BUN: 11 mg/dL (ref 8–23)
CO2: 24 mmol/L (ref 22–32)
Calcium: 9.6 mg/dL (ref 8.9–10.3)
Chloride: 100 mmol/L (ref 98–111)
Creatinine, Ser: 0.84 mg/dL (ref 0.61–1.24)
GFR, Estimated: >60 mL/min (ref >60)
Glucose, Bld: 100 mg/dL — ABNORMAL HIGH (ref 70–99)
Potassium: 3.9 mmol/L (ref 3.5–5.1)
Sodium: 138 mmol/L (ref 135–145)
Total Bilirubin: 0.3 mg/dL (ref 0.0–1.2)
Total Protein: 8.1 g/dL (ref 6.5–8.1)

## 2024-12-13 LAB — CBC
HCT: 34.2 % — ABNORMAL LOW (ref 39.0–52.0)
Hemoglobin: 11 g/dL — ABNORMAL LOW (ref 13.0–17.0)
MCH: 30.9 pg (ref 26.0–34.0)
MCHC: 32.2 g/dL (ref 30.0–36.0)
MCV: 96.1 fL (ref 80.0–100.0)
Platelets: 217 K/uL (ref 150–400)
RBC: 3.56 MIL/uL — ABNORMAL LOW (ref 4.22–5.81)
RDW: 14.9 % (ref 11.5–15.5)
WBC: 4.3 K/uL (ref 4.0–10.5)
nRBC: 0 % (ref 0.0–0.2)

## 2024-12-13 MED ORDER — OXYCODONE HCL 5 MG PO TABS
10.0000 mg | ORAL_TABLET | Freq: Once | ORAL | Status: AC
  Administered 2024-12-13: 09:00:00 10 mg via ORAL
  Filled 2024-12-13: qty 2

## 2024-12-13 MED ORDER — OXYCODONE HCL 5 MG PO TABS
10.0000 mg | ORAL_TABLET | Freq: Once | ORAL | Status: AC
  Administered 2024-12-13: 04:00:00 10 mg via ORAL
  Filled 2024-12-13: qty 2

## 2024-12-13 NOTE — Discharge Instructions (Addendum)
 Evaluation today was overall reassuring.  As we discussed please contact the pain clinic regarding your medications for your pain.  In the meantime recommend Tylenol  and ibuprofen .

## 2024-12-13 NOTE — ED Provider Notes (Signed)
 [place] EMERGENCY DEPARTMENT AT [facility] Provider Note   CSN: [id number] Arrival date & time: 12/13/24  9373     Patient presents with: Withdrawal  HPI Sean Mccoy is a 63 y.o. male with chronic pain presenting for concern with withdrawal.  Patient has been on oxycodone  for his pain and states that he stated a hotel room a few days ago and left his medications in there.  He is reporting loose bowels and nausea.  Denies abdominal pain or fever.  States he has a appointment with his pain clinic next week but did want to bother them.  Denies chest pain shortness of breath.     HPI     Prior to Admission medications  Medication Sig Start Date End Date Taking? Authorizing Provider  acetaminophen  (TYLENOL ) 325 MG tablet Take 2 tablets (650 mg total) by mouth every 6 (six) hours as needed for mild pain (pain score 1-3), fever or headache. 10/20/23   Samtani, Jai-Gurmukh, MD  amLODipine  (NORVASC ) 5 MG tablet Take 1 tablet (5 mg total) by mouth daily. 10/21/23   Samtani, Jai-Gurmukh, MD  atorvastatin (LIPITOR) 40 MG tablet Take 40 mg by mouth at bedtime.    [provider]  cyclobenzaprine  (FLEXERIL ) 5 MG tablet Take 1 tablet (5 mg total) by mouth 3 (three) times daily as needed. 07/23/24   Patt Alm Macho, MD  EPINEPHrine  0.3 mg/0.3 mL IJ SOAJ injection Inject 0.3 mg into the muscle as needed for anaphylaxis. 11/10/24   Minnie Tinnie BRAVO, PA  furosemide  (LASIX ) 20 MG tablet Take 1 tablet (20 mg total) by mouth daily. Use this medication and take it if you gain more than 2 pounds in a 24 hours perido of time and /or if you feel like you are accumutaing fluid in your lower extremities 10/20/23   Samtani, Jai-Gurmukh, MD  lisinopril -hydrochlorothiazide  (ZESTORETIC ) 20-25 MG tablet Take 1 tablet by mouth daily.    [provider]  magnesium  oxide (MAG-OX) 400 (240 Mg) MG tablet Take 800 mg by mouth daily.    [provider]  ondansetron  (ZOFRAN -ODT) 4 MG  disintegrating tablet Take 1 tablet (4 mg total) by mouth every 8 (eight) hours as needed for nausea or vomiting. 03/13/24   Tharon Lung, MD  oxyCODONE  (OXY IR/ROXICODONE ) 5 MG immediate release tablet Take 2 tablets (10 mg total) by mouth 3 (three) times daily. 10/20/23   Samtani, Jai-Gurmukh, MD  pregabalin (LYRICA) 50 MG capsule Take 50 mg by mouth 2 (two) times daily.    [provider]  tamsulosin (FLOMAX) 0.4 MG CAPS capsule Take 0.4 mg by mouth in the morning.    [provider]  terbinafine  (LAMISIL ) 250 MG tablet Take 1 tablet (250 mg total) by mouth daily. Patient not taking: Reported on 08/07/2024 01/04/24   Valdemar Rocky SAUNDERS, NP    Allergies: Patient has no known allergies.    Review of Systems See HPI  Updated Vital Signs BP (!) 154/88 (BP Location: Right Arm)   Pulse (!) 101   Temp 98 F (36.7 C)   Resp 16   Ht 5' 10 (1.778 m)   Wt 88.5 kg   SpO2 98%   BMI 27.99 kg/m   Physical Exam Vitals and nursing note reviewed.  HENT:     Head: Normocephalic and atraumatic.     Mouth/Throat:     Mouth: Mucous membranes are moist.  Eyes:     General:        Right eye: No  discharge.        Left eye: No discharge.     Conjunctiva/sclera: Conjunctivae normal.  Cardiovascular:     Rate and Rhythm: Normal rate and regular rhythm.     Pulses: Normal pulses.     Heart sounds: Normal heart sounds.  Pulmonary:     Effort: Pulmonary effort is normal.     Breath sounds: Normal breath sounds.  Abdominal:     General: Abdomen is flat.     Palpations: Abdomen is soft.  Skin:    General: Skin is warm and dry.  Neurological:     General: No focal deficit present.  Psychiatric:        Mood and Affect: Mood normal.     (all labs ordered are listed, but only abnormal results are displayed) Labs Reviewed - No data to display  EKG: None  Radiology: No results found.   Procedures   Medications Ordered in the ED  oxyCODONE  (Oxy IR/ROXICODONE ) immediate  release tablet 10 mg (has no administration in time range)                                    Medical Decision Making  63 year old presenting for concern with withdrawal.  Exam is unremarkable.  Has been on oxycodone  for chronic pain.  Per PDMP review had his oxycodone  prescription refilled on December 3 and was given 84 tablets.  At this time he does not appear in acute distress, well-appearing and hematology stable albeit intermittently tachycardic.  Given 1 dose of his oxycodone .  Advised him to reach out to the pain clinic today and discuss refill.  Discussed return precautions.  Discharged good condition.     Final diagnoses:  Other chronic pain    ED Discharge Orders     None          Helana Macbride K, PA-C 12/13/24 0901    Patsey Lot, MD 12/13/24 [phone]

## 2024-12-13 NOTE — ED Triage Notes (Signed)
 Pt POV d/t withdrawal from Oxycodone  - has diarrhea and  loss of energy.
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
## 2024-12-13 NOTE — Discharge Instructions (Addendum)
 Please follow-up with your primary care team for refills of your oxycodone .
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
## 2024-12-24 ENCOUNTER — Emergency Department (HOSPITAL_COMMUNITY)
Admission: EM | Admit: 2024-12-24 | Discharge: 2024-12-25 | Disposition: A | Discharge status: Home / Self Care | Payer: Medicare (Managed Care) | Attending: Emergency Medicine | Admitting: Emergency Medicine

## 2024-12-24 ENCOUNTER — Other Ambulatory Visit: Payer: Self-pay

## 2024-12-24 ENCOUNTER — Encounter (HOSPITAL_COMMUNITY): Payer: Self-pay

## 2024-12-24 ENCOUNTER — Emergency Department (HOSPITAL_COMMUNITY): Payer: Medicare (Managed Care)

## 2024-12-24 DIAGNOSIS — I10 Essential (primary) hypertension: Secondary | ICD-10-CM | POA: Insufficient documentation

## 2024-12-24 DIAGNOSIS — G894 Chronic pain syndrome: Secondary | ICD-10-CM | POA: Insufficient documentation

## 2024-12-24 DIAGNOSIS — S7002XA Contusion of left hip, initial encounter: Secondary | ICD-10-CM | POA: Insufficient documentation

## 2024-12-24 DIAGNOSIS — M25552 Pain in left hip: Secondary | ICD-10-CM | POA: Diagnosis present

## 2024-12-24 MED ORDER — HYDROMORPHONE HCL 1 MG/ML IJ SOLN
1.0000 mg | Freq: Once | INTRAMUSCULAR | Status: AC
  Administered 2024-12-24: 1 mg via INTRAVENOUS
  Filled 2024-12-24: qty 1

## 2024-12-24 MED ORDER — ONDANSETRON HCL 4 MG/2ML IJ SOLN
4.0000 mg | Freq: Once | INTRAMUSCULAR | Status: AC
  Administered 2024-12-24: 4 mg via INTRAVENOUS
  Filled 2024-12-24: qty 2

## 2024-12-24 NOTE — ED Notes (Signed)
 Pt report noncompliance with home hypertension medications. Reports his PCP has warned him about not taking his medications.
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
## 2024-12-25 NOTE — ED Notes (Signed)
 Pt has bus pass in right pocket.
